# Patient Record
Sex: Female | Born: 1965 | Race: White | Hispanic: No | Marital: Married | Smoking: Former smoker
Health system: Southern US, Community
[De-identification: ages and names within clinical notes are randomized; demographics above are authoritative.]

## PROBLEM LIST (undated history)

## (undated) ENCOUNTER — Emergency Department
Admission: EM | Disposition: A | Discharge status: Home / Self Care | Payer: No Typology Code available for payment source

## (undated) DIAGNOSIS — M519 Unspecified thoracic, thoracolumbar and lumbosacral intervertebral disc disorder: Secondary | ICD-10-CM

## (undated) DIAGNOSIS — J449 Chronic obstructive pulmonary disease, unspecified: Secondary | ICD-10-CM

## (undated) DIAGNOSIS — L280 Lichen simplex chronicus: Secondary | ICD-10-CM

## (undated) DIAGNOSIS — IMO0002 Reserved for concepts with insufficient information to code with codable children: Secondary | ICD-10-CM

## (undated) DIAGNOSIS — Z973 Presence of spectacles and contact lenses: Secondary | ICD-10-CM

## (undated) DIAGNOSIS — F329 Major depressive disorder, single episode, unspecified: Secondary | ICD-10-CM

## (undated) DIAGNOSIS — F32A Depression, unspecified: Secondary | ICD-10-CM

## (undated) DIAGNOSIS — F419 Anxiety disorder, unspecified: Secondary | ICD-10-CM

## (undated) DIAGNOSIS — G4700 Insomnia, unspecified: Secondary | ICD-10-CM

## (undated) DIAGNOSIS — K219 Gastro-esophageal reflux disease without esophagitis: Secondary | ICD-10-CM

## (undated) DIAGNOSIS — F319 Bipolar disorder, unspecified: Secondary | ICD-10-CM

## (undated) DIAGNOSIS — N809 Endometriosis, unspecified: Secondary | ICD-10-CM

## (undated) DIAGNOSIS — E119 Type 2 diabetes mellitus without complications: Secondary | ICD-10-CM

## (undated) DIAGNOSIS — E8940 Asymptomatic postprocedural ovarian failure: Secondary | ICD-10-CM

## (undated) DIAGNOSIS — N952 Postmenopausal atrophic vaginitis: Secondary | ICD-10-CM

## (undated) DIAGNOSIS — L900 Lichen sclerosus et atrophicus: Secondary | ICD-10-CM

## (undated) DIAGNOSIS — I509 Heart failure, unspecified: Secondary | ICD-10-CM

## (undated) HISTORY — DX: Asymptomatic postprocedural ovarian failure: E89.40

## (undated) HISTORY — DX: Insomnia, unspecified: G47.00

## (undated) HISTORY — DX: Heart failure, unspecified: I50.9

## (undated) HISTORY — DX: Postmenopausal atrophic vaginitis: N95.2

## (undated) HISTORY — PX: OTHER SURGICAL HISTORY: SHX169

## (undated) HISTORY — DX: Lichen simplex chronicus: L28.0

## (undated) HISTORY — DX: Endometriosis, unspecified: N80.9

## (undated) HISTORY — DX: Lichen sclerosus et atrophicus: L90.0

## (undated) HISTORY — DX: Unspecified thoracic, thoracolumbar and lumbosacral intervertebral disc disorder: M51.9

## (undated) HISTORY — DX: Reserved for concepts with insufficient information to code with codable children: IMO0002

---

## 1993-01-17 HISTORY — PX: ABDOMINAL HYSTERECTOMY: SHX81

## 2006-06-13 ENCOUNTER — Other Ambulatory Visit: Payer: Self-pay

## 2006-06-13 ENCOUNTER — Emergency Department: Payer: Self-pay

## 2006-06-16 ENCOUNTER — Emergency Department: Payer: Self-pay | Admitting: Emergency Medicine

## 2007-08-29 ENCOUNTER — Emergency Department: Payer: Self-pay | Admitting: Emergency Medicine

## 2008-03-22 ENCOUNTER — Emergency Department: Payer: Self-pay | Admitting: Emergency Medicine

## 2008-12-19 ENCOUNTER — Emergency Department: Payer: Self-pay | Admitting: Emergency Medicine

## 2009-03-23 ENCOUNTER — Emergency Department: Payer: Self-pay | Admitting: Emergency Medicine

## 2010-10-27 ENCOUNTER — Emergency Department: Payer: Self-pay | Admitting: Emergency Medicine

## 2010-11-09 ENCOUNTER — Ambulatory Visit: Payer: Self-pay | Admitting: Family Medicine

## 2011-01-18 HISTORY — PX: ARM WOUND REPAIR / CLOSURE: SUR1141

## 2011-01-27 ENCOUNTER — Ambulatory Visit: Payer: Self-pay | Admitting: Gastroenterology

## 2011-04-08 ENCOUNTER — Emergency Department: Payer: Self-pay | Admitting: Emergency Medicine

## 2011-04-08 LAB — URINALYSIS, COMPLETE
Bilirubin,UR: NEGATIVE
Ketone: NEGATIVE
Leukocyte Esterase: NEGATIVE
Nitrite: NEGATIVE
Protein: NEGATIVE

## 2011-04-23 ENCOUNTER — Emergency Department: Payer: Self-pay | Admitting: *Deleted

## 2011-04-23 LAB — COMPREHENSIVE METABOLIC PANEL
Albumin: 3.2 g/dL — ABNORMAL LOW (ref 3.4–5.0)
Anion Gap: 9 (ref 7–16)
Bilirubin,Total: 0.3 mg/dL (ref 0.2–1.0)
Calcium, Total: 8.9 mg/dL (ref 8.5–10.1)
Co2: 22 mmol/L (ref 21–32)
Creatinine: 0.84 mg/dL (ref 0.60–1.30)
EGFR (Non-African Amer.): >60
Osmolality: 274 (ref 275–301)
Potassium: 3.7 mmol/L (ref 3.5–5.1)
SGOT(AST): 22 U/L (ref 15–37)
Sodium: 136 mmol/L (ref 136–145)

## 2011-04-23 LAB — CBC
HCT: 37.1 % (ref 35.0–47.0)
HGB: 12.6 g/dL (ref 12.0–16.0)
MCH: 32 pg (ref 26.0–34.0)
MCV: 94 fL (ref 80–100)
Platelet: 283 10*3/uL (ref 150–440)
RBC: 3.95 10*6/uL (ref 3.80–5.20)
RDW: 13.7 % (ref 11.5–14.5)

## 2011-04-25 ENCOUNTER — Inpatient Hospital Stay: Payer: Self-pay | Admitting: Surgery

## 2011-04-25 LAB — CBC WITH DIFFERENTIAL/PLATELET
Basophil %: 0.1 %
Eosinophil #: 0.1 10*3/uL (ref 0.0–0.7)
Eosinophil %: 0.7 %
Lymphocyte #: 1.2 10*3/uL (ref 1.0–3.6)
MCV: 94 fL (ref 80–100)
Monocyte %: 7.9 %
Platelet: 287 10*3/uL (ref 150–440)
RDW: 13.9 % (ref 11.5–14.5)
WBC: 19.4 10*3/uL — ABNORMAL HIGH (ref 3.6–11.0)

## 2011-04-25 LAB — COMPREHENSIVE METABOLIC PANEL
Albumin: 2.9 g/dL — ABNORMAL LOW (ref 3.4–5.0)
Bilirubin,Total: 0.6 mg/dL (ref 0.2–1.0)
Chloride: 99 mmol/L (ref 98–107)
Co2: 25 mmol/L (ref 21–32)
Creatinine: 0.83 mg/dL (ref 0.60–1.30)
EGFR (African American): >60
EGFR (Non-African Amer.): >60
Glucose: 113 mg/dL — ABNORMAL HIGH (ref 65–99)
Osmolality: 275 (ref 275–301)
SGOT(AST): 32 U/L (ref 15–37)
SGPT (ALT): 20 U/L
Total Protein: 7.7 g/dL (ref 6.4–8.2)

## 2011-04-29 LAB — CREATININE, SERUM: EGFR (African American): >60

## 2011-04-29 LAB — VANCOMYCIN, TROUGH: Vancomycin, Trough: 30 ug/mL (ref 10–20)

## 2011-04-30 LAB — CULTURE, BLOOD (SINGLE)

## 2011-05-25 ENCOUNTER — Ambulatory Visit: Payer: Self-pay | Admitting: Surgery

## 2011-05-28 LAB — WOUND AEROBIC CULTURE

## 2011-08-19 ENCOUNTER — Emergency Department: Payer: Self-pay | Admitting: *Deleted

## 2011-08-19 LAB — CBC WITH DIFFERENTIAL/PLATELET
Basophil #: 0 10*3/uL (ref 0.0–0.1)
Eosinophil #: 0.3 10*3/uL (ref 0.0–0.7)
Eosinophil %: 2.9 %
HCT: 39 % (ref 35.0–47.0)
Lymphocyte %: 34.3 %
MCHC: 34.9 g/dL (ref 32.0–36.0)
MCV: 92 fL (ref 80–100)
Monocyte #: 0.6 x10 3/mm (ref 0.2–0.9)
Monocyte %: 6.8 %
Neutrophil %: 55.6 %
Platelet: 290 10*3/uL (ref 150–440)
RBC: 4.26 10*6/uL (ref 3.80–5.20)
RDW: 13.6 % (ref 11.5–14.5)

## 2011-08-19 LAB — BASIC METABOLIC PANEL
Anion Gap: 9 (ref 7–16)
BUN: 13 mg/dL (ref 7–18)
Co2: 27 mmol/L (ref 21–32)
EGFR (Non-African Amer.): >60
Glucose: 103 mg/dL — ABNORMAL HIGH (ref 65–99)
Osmolality: 285 (ref 275–301)

## 2012-02-03 ENCOUNTER — Emergency Department: Payer: Self-pay | Admitting: Emergency Medicine

## 2012-02-05 ENCOUNTER — Emergency Department: Payer: Self-pay | Admitting: Emergency Medicine

## 2012-02-06 ENCOUNTER — Emergency Department: Payer: Self-pay | Admitting: Emergency Medicine

## 2012-02-07 LAB — WOUND CULTURE

## 2012-03-02 LAB — COMPREHENSIVE METABOLIC PANEL
Anion Gap: 7 (ref 7–16)
BUN: 7 mg/dL (ref 7–18)
Bilirubin,Total: 0.3 mg/dL (ref 0.2–1.0)
Calcium, Total: 8.7 mg/dL (ref 8.5–10.1)
Chloride: 108 mmol/L — ABNORMAL HIGH (ref 98–107)
Co2: 24 mmol/L (ref 21–32)
Creatinine: 0.96 mg/dL (ref 0.60–1.30)
EGFR (African American): >60
EGFR (Non-African Amer.): >60
Glucose: 98 mg/dL (ref 65–99)
Osmolality: 275 (ref 275–301)
SGOT(AST): 22 U/L (ref 15–37)
Sodium: 139 mmol/L (ref 136–145)
Total Protein: 7.8 g/dL (ref 6.4–8.2)

## 2012-03-02 LAB — CBC
HCT: 44 % (ref 35.0–47.0)
MCH: 30.8 pg (ref 26.0–34.0)
MCHC: 33.9 g/dL (ref 32.0–36.0)
MCV: 91 fL (ref 80–100)
Platelet: 261 10*3/uL (ref 150–440)
RBC: 4.86 10*6/uL (ref 3.80–5.20)
WBC: 11.7 10*3/uL — ABNORMAL HIGH (ref 3.6–11.0)

## 2012-03-02 LAB — ETHANOL
Ethanol %: <0.003 % (ref 0.000–0.080)
Ethanol: <3 mg/dL

## 2012-03-02 LAB — DRUG SCREEN, URINE
Barbiturates, Ur Screen: NEGATIVE (ref ?–200)
Cocaine Metabolite,Ur ~~LOC~~: NEGATIVE (ref ?–300)
Methadone, Ur Screen: NEGATIVE (ref ?–300)
Opiate, Ur Screen: NEGATIVE (ref ?–300)
Tricyclic, Ur Screen: NEGATIVE (ref ?–1000)

## 2012-03-02 LAB — TSH: Thyroid Stimulating Horm: 1.38 u[IU]/mL

## 2012-03-02 LAB — URINALYSIS, COMPLETE
Bilirubin,UR: NEGATIVE
Blood: NEGATIVE
Ketone: NEGATIVE
Leukocyte Esterase: NEGATIVE
Ph: 6 (ref 4.5–8.0)
Protein: NEGATIVE
Specific Gravity: 1.015 (ref 1.003–1.030)

## 2012-03-02 LAB — ACETAMINOPHEN LEVEL: Acetaminophen: 5 ug/mL — ABNORMAL LOW

## 2012-03-03 ENCOUNTER — Inpatient Hospital Stay: Payer: Self-pay | Admitting: Psychiatry

## 2012-03-28 ENCOUNTER — Emergency Department: Payer: Self-pay | Admitting: Emergency Medicine

## 2012-05-14 ENCOUNTER — Ambulatory Visit: Payer: Self-pay

## 2012-08-22 ENCOUNTER — Ambulatory Visit: Payer: Self-pay

## 2012-08-28 ENCOUNTER — Ambulatory Visit: Payer: Self-pay

## 2013-03-18 ENCOUNTER — Ambulatory Visit: Payer: Self-pay

## 2013-03-27 ENCOUNTER — Ambulatory Visit: Payer: Self-pay

## 2013-08-25 ENCOUNTER — Emergency Department (HOSPITAL_COMMUNITY)
Admission: EM | Admit: 2013-08-25 | Discharge: 2013-08-26 | Disposition: A | Discharge status: Home / Self Care | Payer: Self-pay | Attending: Emergency Medicine | Admitting: Emergency Medicine

## 2013-08-25 ENCOUNTER — Emergency Department (HOSPITAL_COMMUNITY): Payer: No Typology Code available for payment source

## 2013-08-25 DIAGNOSIS — S8990XA Unspecified injury of unspecified lower leg, initial encounter: Secondary | ICD-10-CM | POA: Insufficient documentation

## 2013-08-25 DIAGNOSIS — Y92410 Unspecified street and highway as the place of occurrence of the external cause: Secondary | ICD-10-CM | POA: Insufficient documentation

## 2013-08-25 DIAGNOSIS — Z79899 Other long term (current) drug therapy: Secondary | ICD-10-CM | POA: Insufficient documentation

## 2013-08-25 DIAGNOSIS — S99919A Unspecified injury of unspecified ankle, initial encounter: Secondary | ICD-10-CM

## 2013-08-25 DIAGNOSIS — T07XXXA Unspecified multiple injuries, initial encounter: Secondary | ICD-10-CM

## 2013-08-25 DIAGNOSIS — S99929A Unspecified injury of unspecified foot, initial encounter: Secondary | ICD-10-CM

## 2013-08-25 DIAGNOSIS — S81809A Unspecified open wound, unspecified lower leg, initial encounter: Principal | ICD-10-CM

## 2013-08-25 DIAGNOSIS — IMO0002 Reserved for concepts with insufficient information to code with codable children: Secondary | ICD-10-CM | POA: Insufficient documentation

## 2013-08-25 DIAGNOSIS — S81012A Laceration without foreign body, left knee, initial encounter: Secondary | ICD-10-CM

## 2013-08-25 DIAGNOSIS — S91009A Unspecified open wound, unspecified ankle, initial encounter: Principal | ICD-10-CM

## 2013-08-25 DIAGNOSIS — S81009A Unspecified open wound, unspecified knee, initial encounter: Secondary | ICD-10-CM | POA: Insufficient documentation

## 2013-08-25 DIAGNOSIS — Y9389 Activity, other specified: Secondary | ICD-10-CM | POA: Insufficient documentation

## 2013-08-25 LAB — CBC WITH DIFFERENTIAL/PLATELET
Basophils Absolute: 0 10*3/uL (ref 0.0–0.1)
Basophils Relative: 0 % (ref 0–1)
EOS ABS: 0.1 10*3/uL (ref 0.0–0.7)
Eosinophils Relative: 1 % (ref 0–5)
HCT: 39.3 % (ref 36.0–46.0)
HEMOGLOBIN: 13 g/dL (ref 12.0–15.0)
LYMPHS ABS: 3.6 10*3/uL (ref 0.7–4.0)
Lymphocytes Relative: 37 % (ref 12–46)
MCH: 31.9 pg (ref 26.0–34.0)
MCHC: 33.1 g/dL (ref 30.0–36.0)
MCV: 96.3 fL (ref 78.0–100.0)
Monocytes Absolute: 0.7 10*3/uL (ref 0.1–1.0)
Monocytes Relative: 8 % (ref 3–12)
NEUTROS ABS: 5.3 10*3/uL (ref 1.7–7.7)
NEUTROS PCT: 54 % (ref 43–77)
Platelets: 262 10*3/uL (ref 150–400)
RBC: 4.08 MIL/uL (ref 3.87–5.11)
RDW: 14.7 % (ref 11.5–15.5)
WBC: 9.7 10*3/uL (ref 4.0–10.5)

## 2013-08-25 LAB — BASIC METABOLIC PANEL
Anion gap: 15 (ref 5–15)
BUN: 12 mg/dL (ref 6–23)
CHLORIDE: 101 meq/L (ref 96–112)
CO2: 22 mEq/L (ref 19–32)
CREATININE: 0.86 mg/dL (ref 0.50–1.10)
Calcium: 9.2 mg/dL (ref 8.4–10.5)
GFR calc Af Amer: >90 mL/min (ref >90)
GFR calc non Af Amer: 80 mL/min — ABNORMAL LOW (ref >90)
GLUCOSE: 102 mg/dL — AB (ref 70–99)
POTASSIUM: 4.3 meq/L (ref 3.7–5.3)
Sodium: 138 mEq/L (ref 137–147)

## 2013-08-25 LAB — POC URINE PREG, ED: Preg Test, Ur: NEGATIVE

## 2013-08-25 MED ORDER — CEFAZOLIN SODIUM 1-5 GM-% IV SOLN
1.0000 g | Freq: Once | INTRAVENOUS | Status: AC
  Administered 2013-08-26: 1 g via INTRAVENOUS
  Filled 2013-08-25: qty 50

## 2013-08-25 MED ORDER — FENTANYL CITRATE 0.05 MG/ML IJ SOLN
25.0000 ug | Freq: Once | INTRAMUSCULAR | Status: AC
  Administered 2013-08-25: 25 ug via INTRAVENOUS
  Filled 2013-08-25: qty 2

## 2013-08-25 MED ORDER — LIDOCAINE HCL (PF) 1 % IJ SOLN
30.0000 mL | Freq: Once | INTRAMUSCULAR | Status: AC
  Administered 2013-08-26: 30 mL
  Filled 2013-08-25: qty 30

## 2013-08-25 NOTE — ED Notes (Signed)
Dr. Romeo AppleHarrison at the bedside.  He gives verbal order for POC pregnancy urine test.

## 2013-08-25 NOTE — ED Notes (Signed)
Notified patient that her husband is on the way
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
## 2013-08-25 NOTE — ED Notes (Signed)
Dr. Smith at the bedside.  

## 2013-08-25 NOTE — ED Notes (Signed)
Backboard removed by Dr. Katrinka BlazingSmith with RN assist

## 2013-08-25 NOTE — ED Notes (Signed)
Phlebotomy at the bedside  

## 2013-08-25 NOTE — ED Notes (Signed)
Dr. Harrison at the bedside. 
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
## 2013-08-26 MED ORDER — CEPHALEXIN 500 MG PO CAPS: 500.0000 mg | ORAL_CAPSULE | Freq: Three times a day (TID) | ORAL | Status: DC

## 2013-08-26 MED ORDER — FENTANYL CITRATE 0.05 MG/ML IJ SOLN
25.0000 ug | Freq: Once | INTRAMUSCULAR | Status: AC
  Administered 2013-08-26: 25 ug via INTRAVENOUS

## 2013-08-26 MED ORDER — OXYCODONE-ACETAMINOPHEN 5-325 MG PO TABS: 1.0000 | ORAL_TABLET | Freq: Four times a day (QID) | ORAL | Status: DC | PRN

## 2013-08-26 NOTE — ED Notes (Signed)
Patient still off the unit.

## 2013-08-26 NOTE — ED Notes (Signed)
Immobilizer placed on left leg. Return demonstration on how to use crutches.

## 2013-08-26 NOTE — ED Notes (Signed)
Dr. Romeo AppleHarrison and Dr. Katrinka BlazingSmith still at the bedside with suturing.

## 2013-08-26 NOTE — Discharge Instructions (Signed)

## 2013-08-27 NOTE — ED Provider Notes (Signed)
Medical screening examination/treatment/procedure(s) were conducted as a shared visit with resident physician and myself.  I personally evaluated the patient during the encounter. I directly supervised and aided in the laceration repair.   I interviewed and examined the patient. Lungs are CTAB. Cardiac exam wnl. Abdomen soft.  Large laceration of left knee. No evidence of violation of the knee joint on imaging. Also no evidence of violation of the knee join on extensive exam performed by me and the resident after local anesthesia. No other serious injury suspected. Strong return precautions given for any evidence of infection.    Purvis SheffieldForrest Jamea Robicheaux, MD 08/27/13 1059

## 2013-09-03 ENCOUNTER — Emergency Department: Payer: Self-pay | Admitting: Emergency Medicine

## 2013-09-05 ENCOUNTER — Emergency Department: Payer: Self-pay | Admitting: Emergency Medicine

## 2013-09-09 ENCOUNTER — Emergency Department: Payer: Self-pay | Admitting: Emergency Medicine

## 2013-09-15 ENCOUNTER — Emergency Department: Payer: Self-pay | Admitting: Emergency Medicine

## 2013-09-15 LAB — COMPREHENSIVE METABOLIC PANEL
ALBUMIN: 3.2 g/dL — AB (ref 3.4–5.0)
ALK PHOS: 161 U/L — AB
Anion Gap: 10 (ref 7–16)
BUN: 9 mg/dL (ref 7–18)
Bilirubin,Total: 0.2 mg/dL (ref 0.2–1.0)
Calcium, Total: 9.3 mg/dL (ref 8.5–10.1)
Chloride: 102 mmol/L (ref 98–107)
Co2: 25 mmol/L (ref 21–32)
Creatinine: 1.22 mg/dL (ref 0.60–1.30)
GFR CALC NON AF AMER: 53 — AB
GLUCOSE: 111 mg/dL — AB (ref 65–99)
Osmolality: 273 (ref 275–301)
POTASSIUM: 4.2 mmol/L (ref 3.5–5.1)
SGOT(AST): 29 U/L (ref 15–37)
SGPT (ALT): 21 U/L
Sodium: 137 mmol/L (ref 136–145)
TOTAL PROTEIN: 7.7 g/dL (ref 6.4–8.2)

## 2013-09-15 LAB — CBC WITH DIFFERENTIAL/PLATELET
Basophil #: 0.1 10*3/uL (ref 0.0–0.1)
Basophil %: 1.3 %
EOS ABS: 0.1 10*3/uL (ref 0.0–0.7)
Eosinophil %: 0.9 %
HCT: 36.4 % (ref 35.0–47.0)
HGB: 11.9 g/dL — ABNORMAL LOW (ref 12.0–16.0)
Lymphocyte #: 2.5 10*3/uL (ref 1.0–3.6)
Lymphocyte %: 29 %
MCH: 32.1 pg (ref 26.0–34.0)
MCHC: 32.8 g/dL (ref 32.0–36.0)
MCV: 98 fL (ref 80–100)
MONO ABS: 0.6 x10 3/mm (ref 0.2–0.9)
MONOS PCT: 6.5 %
NEUTROS PCT: 62.3 %
Neutrophil #: 5.3 10*3/uL (ref 1.4–6.5)
Platelet: 378 10*3/uL (ref 150–440)
RBC: 3.71 10*6/uL — AB (ref 3.80–5.20)
RDW: 14.2 % (ref 11.5–14.5)
WBC: 8.5 10*3/uL (ref 3.6–11.0)

## 2013-09-20 ENCOUNTER — Encounter (HOSPITAL_BASED_OUTPATIENT_CLINIC_OR_DEPARTMENT_OTHER): Payer: Self-pay | Admitting: *Deleted

## 2013-09-24 ENCOUNTER — Encounter (HOSPITAL_BASED_OUTPATIENT_CLINIC_OR_DEPARTMENT_OTHER): Payer: No Typology Code available for payment source | Admitting: Anesthesiology

## 2013-09-24 ENCOUNTER — Ambulatory Visit (HOSPITAL_BASED_OUTPATIENT_CLINIC_OR_DEPARTMENT_OTHER): Payer: No Typology Code available for payment source | Admitting: Anesthesiology

## 2013-09-24 ENCOUNTER — Encounter (HOSPITAL_BASED_OUTPATIENT_CLINIC_OR_DEPARTMENT_OTHER): Payer: Self-pay | Admitting: Anesthesiology

## 2013-09-24 ENCOUNTER — Ambulatory Visit (HOSPITAL_BASED_OUTPATIENT_CLINIC_OR_DEPARTMENT_OTHER)
Admission: RE | Admit: 2013-09-24 | Discharge: 2013-09-24 | Disposition: A | Discharge status: Home / Self Care | Payer: Self-pay | Source: Ambulatory Visit | Attending: Orthopedic Surgery | Admitting: Orthopedic Surgery

## 2013-09-24 ENCOUNTER — Encounter (HOSPITAL_BASED_OUTPATIENT_CLINIC_OR_DEPARTMENT_OTHER)
Admission: RE | Disposition: A | Discharge status: Home / Self Care | Payer: Self-pay | Source: Ambulatory Visit | Attending: Orthopedic Surgery

## 2013-09-24 DIAGNOSIS — T8133XA Disruption of traumatic injury wound repair, initial encounter: Secondary | ICD-10-CM | POA: Insufficient documentation

## 2013-09-24 DIAGNOSIS — Y838 Other surgical procedures as the cause of abnormal reaction of the patient, or of later complication, without mention of misadventure at the time of the procedure: Secondary | ICD-10-CM | POA: Insufficient documentation

## 2013-09-24 DIAGNOSIS — Z886 Allergy status to analgesic agent status: Secondary | ICD-10-CM | POA: Insufficient documentation

## 2013-09-24 DIAGNOSIS — K219 Gastro-esophageal reflux disease without esophagitis: Secondary | ICD-10-CM | POA: Insufficient documentation

## 2013-09-24 DIAGNOSIS — Z885 Allergy status to narcotic agent status: Secondary | ICD-10-CM | POA: Insufficient documentation

## 2013-09-24 DIAGNOSIS — Z90710 Acquired absence of both cervix and uterus: Secondary | ICD-10-CM | POA: Insufficient documentation

## 2013-09-24 DIAGNOSIS — F3289 Other specified depressive episodes: Secondary | ICD-10-CM | POA: Insufficient documentation

## 2013-09-24 DIAGNOSIS — F411 Generalized anxiety disorder: Secondary | ICD-10-CM | POA: Insufficient documentation

## 2013-09-24 DIAGNOSIS — L980 Pyogenic granuloma: Secondary | ICD-10-CM | POA: Insufficient documentation

## 2013-09-24 DIAGNOSIS — F329 Major depressive disorder, single episode, unspecified: Secondary | ICD-10-CM | POA: Insufficient documentation

## 2013-09-24 HISTORY — DX: Depression, unspecified: F32.A

## 2013-09-24 HISTORY — DX: Anxiety disorder, unspecified: F41.9

## 2013-09-24 HISTORY — DX: Major depressive disorder, single episode, unspecified: F32.9

## 2013-09-24 HISTORY — DX: Gastro-esophageal reflux disease without esophagitis: K21.9

## 2013-09-24 HISTORY — PX: KNEE BURSECTOMY: SHX5882

## 2013-09-24 HISTORY — DX: Presence of spectacles and contact lenses: Z97.3

## 2013-09-24 LAB — POCT HEMOGLOBIN-HEMACUE: Hemoglobin: 12.2 g/dL (ref 12.0–15.0)

## 2013-09-24 SURGERY — BURSECTOMY, KNEE
Anesthesia: General | Site: Knee | Laterality: Left

## 2013-09-24 MED ORDER — ASPIRIN 81 MG PO TABS: 81.0000 mg | ORAL_TABLET | Freq: Every day | ORAL | Status: DC

## 2013-09-24 MED ORDER — FENTANYL CITRATE 0.05 MG/ML IJ SOLN: 50.0000 ug | INTRAMUSCULAR | Status: DC | PRN

## 2013-09-24 MED ORDER — MIDAZOLAM HCL 2 MG/2ML IJ SOLN
INTRAMUSCULAR | Status: AC
  Filled 2013-09-24: qty 2

## 2013-09-24 MED ORDER — DOCUSATE SODIUM 100 MG PO CAPS: 100.0000 mg | ORAL_CAPSULE | Freq: Two times a day (BID) | ORAL | Status: DC

## 2013-09-24 MED ORDER — HYDROMORPHONE HCL PF 1 MG/ML IJ SOLN
0.2500 mg | INTRAMUSCULAR | Status: DC | PRN
  Administered 2013-09-24 (×3): 0.5 mg via INTRAVENOUS

## 2013-09-24 MED ORDER — HYDROMORPHONE HCL PF 1 MG/ML IJ SOLN
INTRAMUSCULAR | Status: AC
  Filled 2013-09-24: qty 1

## 2013-09-24 MED ORDER — CEFAZOLIN SODIUM-DEXTROSE 2-3 GM-% IV SOLR
2.0000 g | INTRAVENOUS | Status: AC
  Administered 2013-09-24: 2 g via INTRAVENOUS

## 2013-09-24 MED ORDER — MIDAZOLAM HCL 2 MG/2ML IJ SOLN: 1.0000 mg | INTRAMUSCULAR | Status: DC | PRN

## 2013-09-24 MED ORDER — FENTANYL CITRATE 0.05 MG/ML IJ SOLN
INTRAMUSCULAR | Status: DC | PRN
Start: 2013-09-24 — End: 2013-09-24
  Administered 2013-09-24 (×3): 50 ug via INTRAVENOUS

## 2013-09-24 MED ORDER — ACETAMINOPHEN 500 MG PO TABS: 1000.0000 mg | ORAL_TABLET | Freq: Once | ORAL | Status: DC

## 2013-09-24 MED ORDER — FENTANYL CITRATE 0.05 MG/ML IJ SOLN
INTRAMUSCULAR | Status: AC
  Filled 2013-09-24: qty 6

## 2013-09-24 MED ORDER — ONDANSETRON HCL 4 MG/2ML IJ SOLN: 4.0000 mg | Freq: Four times a day (QID) | INTRAMUSCULAR | Status: DC | PRN

## 2013-09-24 MED ORDER — LIDOCAINE HCL (CARDIAC) 20 MG/ML IV SOLN
INTRAVENOUS | Status: DC | PRN
  Administered 2013-09-24: 50 mg via INTRAVENOUS

## 2013-09-24 MED ORDER — LACTATED RINGERS IV SOLN
INTRAVENOUS | Status: DC
  Administered 2013-09-24 (×2): via INTRAVENOUS

## 2013-09-24 MED ORDER — PROPOFOL 10 MG/ML IV BOLUS
INTRAVENOUS | Status: DC | PRN
  Administered 2013-09-24: 180 mg via INTRAVENOUS
  Administered 2013-09-24: 20 mg via INTRAVENOUS

## 2013-09-24 MED ORDER — BUPIVACAINE HCL (PF) 0.25 % IJ SOLN
INTRAMUSCULAR | Status: AC
  Filled 2013-09-24: qty 30

## 2013-09-24 MED ORDER — OXYCODONE-ACETAMINOPHEN 5-325 MG PO TABS: 2.0000 | ORAL_TABLET | ORAL | Status: DC | PRN

## 2013-09-24 MED ORDER — OXYCODONE HCL 5 MG/5ML PO SOLN: 5.0000 mg | Freq: Once | ORAL | Status: AC | PRN

## 2013-09-24 MED ORDER — ONDANSETRON HCL 4 MG PO TABS: 4.0000 mg | ORAL_TABLET | Freq: Three times a day (TID) | ORAL | Status: DC | PRN

## 2013-09-24 MED ORDER — OXYCODONE HCL 5 MG PO TABS
ORAL_TABLET | ORAL | Status: AC
  Filled 2013-09-24: qty 1

## 2013-09-24 MED ORDER — DEXTROSE-NACL 5-0.45 % IV SOLN: 100.0000 mL/h | INTRAVENOUS | Status: DC

## 2013-09-24 MED ORDER — DEXAMETHASONE SODIUM PHOSPHATE 10 MG/ML IJ SOLN
INTRAMUSCULAR | Status: DC | PRN
  Administered 2013-09-24: 10 mg via INTRAVENOUS

## 2013-09-24 MED ORDER — OXYCODONE HCL 5 MG PO TABS
5.0000 mg | ORAL_TABLET | Freq: Once | ORAL | Status: AC | PRN
  Administered 2013-09-24: 5 mg via ORAL

## 2013-09-24 SURGICAL SUPPLY — 65 items
BAG DECANTER FOR FLEXI CONT (MISCELLANEOUS) IMPLANT
BANDAGE ELASTIC 6 VELCRO ST LF (GAUZE/BANDAGES/DRESSINGS) ×3 IMPLANT
BANDAGE ESMARK 6X9 LF (GAUZE/BANDAGES/DRESSINGS) ×1 IMPLANT
BLADE SURG 10 STRL SS (BLADE) ×3 IMPLANT
BLADE SURG 15 STRL LF DISP TIS (BLADE) ×1 IMPLANT
BLADE SURG 15 STRL SS (BLADE) ×2
BNDG ESMARK 6X9 LF (GAUZE/BANDAGES/DRESSINGS) ×3
CHLORAPREP W/TINT 26ML (MISCELLANEOUS) ×3 IMPLANT
CLEANER CAUTERY TIP 5X5 PAD (MISCELLANEOUS) IMPLANT
CLOSURE WOUND 1/2 X4 (GAUZE/BANDAGES/DRESSINGS) ×1
DECANTER SPIKE VIAL GLASS SM (MISCELLANEOUS) IMPLANT
DRAPE EXTREMITY T 121X128X90 (DRAPE) ×3 IMPLANT
DRAPE U-SHAPE 47X51 STRL (DRAPES) ×3 IMPLANT
DRSG ADAPTIC 3X8 NADH LF (GAUZE/BANDAGES/DRESSINGS) ×3 IMPLANT
DRSG EMULSION OIL 3X3 NADH (GAUZE/BANDAGES/DRESSINGS) IMPLANT
DRSG PAD ABDOMINAL 8X10 ST (GAUZE/BANDAGES/DRESSINGS) ×3 IMPLANT
ELECT REM PT RETURN 9FT ADLT (ELECTROSURGICAL) ×3
ELECTRODE REM PT RTRN 9FT ADLT (ELECTROSURGICAL) ×1 IMPLANT
GAUZE PACKING IODOFORM 2 (PACKING) ×3 IMPLANT
GAUZE SPONGE 4X4 12PLY STRL (GAUZE/BANDAGES/DRESSINGS) ×3 IMPLANT
GLOVE BIO SURGEON STRL SZ 6.5 (GLOVE) ×2 IMPLANT
GLOVE BIO SURGEON STRL SZ7.5 (GLOVE) ×3 IMPLANT
GLOVE BIO SURGEONS STRL SZ 6.5 (GLOVE) ×1
GLOVE BIOGEL PI IND STRL 7.0 (GLOVE) ×2 IMPLANT
GLOVE BIOGEL PI IND STRL 8 (GLOVE) ×1 IMPLANT
GLOVE BIOGEL PI INDICATOR 7.0 (GLOVE) ×4
GLOVE BIOGEL PI INDICATOR 8 (GLOVE) ×2
GOWN STRL REUS W/ TWL LRG LVL3 (GOWN DISPOSABLE) ×1 IMPLANT
GOWN STRL REUS W/ TWL XL LVL3 (GOWN DISPOSABLE) ×1 IMPLANT
GOWN STRL REUS W/TWL LRG LVL3 (GOWN DISPOSABLE) ×2
GOWN STRL REUS W/TWL XL LVL3 (GOWN DISPOSABLE) ×2
IMMOBILIZER KNEE 22 UNIV (SOFTGOODS) IMPLANT
IMMOBILIZER KNEE 24 THIGH 36 (MISCELLANEOUS) IMPLANT
IMMOBILIZER KNEE 24 UNIV (MISCELLANEOUS)
NDL SUT 6 .5 CRC .975X.05 MAYO (NEEDLE) IMPLANT
NEEDLE MAYO TAPER (NEEDLE)
NEEDLE MAYO TROCAR (NEEDLE) IMPLANT
NS IRRIG 1000ML POUR BTL (IV SOLUTION) ×3 IMPLANT
PACK ARTHROSCOPY DSU (CUSTOM PROCEDURE TRAY) ×3 IMPLANT
PACK BASIN DAY SURGERY FS (CUSTOM PROCEDURE TRAY) ×3 IMPLANT
PAD CLEANER CAUTERY TIP 5X5 (MISCELLANEOUS)
PADDING CAST COTTON 6X4 STRL (CAST SUPPLIES) ×3 IMPLANT
PENCIL BUTTON HOLSTER BLD 10FT (ELECTRODE) ×3 IMPLANT
SLEEVE SCD COMPRESS KNEE MED (MISCELLANEOUS) IMPLANT
SPONGE LAP 4X18 X RAY DECT (DISPOSABLE) ×3 IMPLANT
STAPLER VISISTAT 35W (STAPLE) IMPLANT
STRIP CLOSURE SKIN 1/2X4 (GAUZE/BANDAGES/DRESSINGS) ×2 IMPLANT
SUT FIBERWIRE #2 38 T-5 BLUE (SUTURE)
SUT MNCRL AB 4-0 PS2 18 (SUTURE) ×3 IMPLANT
SUT MON AB 2-0 CT1 36 (SUTURE) ×3 IMPLANT
SUT VIC AB 0 CT1 18XCR BRD 8 (SUTURE) IMPLANT
SUT VIC AB 0 CT1 27 (SUTURE)
SUT VIC AB 0 CT1 27XBRD ANBCTR (SUTURE) IMPLANT
SUT VIC AB 0 CT1 8-18 (SUTURE)
SUT VIC AB 0 SH 27 (SUTURE) IMPLANT
SUT VIC AB 1 CT1 27 (SUTURE)
SUT VIC AB 1 CT1 27XBRD ANBCTR (SUTURE) IMPLANT
SUT VIC AB 2-0 SH 27 (SUTURE)
SUT VIC AB 2-0 SH 27XBRD (SUTURE) IMPLANT
SUTURE FIBERWR #2 38 T-5 BLUE (SUTURE) IMPLANT
SYR BULB 3OZ (MISCELLANEOUS) ×3 IMPLANT
TOWEL OR 17X24 6PK STRL BLUE (TOWEL DISPOSABLE) ×3 IMPLANT
TOWEL OR NON WOVEN STRL DISP B (DISPOSABLE) ×3 IMPLANT
UNDERPAD 30X30 INCONTINENT (UNDERPADS AND DIAPERS) ×3 IMPLANT
YANKAUER SUCT BULB TIP NO VENT (SUCTIONS) ×3 IMPLANT

## 2013-09-24 NOTE — Discharge Instructions (Signed)
Weight as tolerated.  Wearing her knee immobilizer full time.  Remove your dressing on Thursday and remove 2 inches of packing every day snipping off the remainder. Do wet-to-dry dressings on the central open portion of the wound. Post Anesthesia Home Care Instructions  Activity: Get plenty of rest for the remainder of the day. A responsible adult should stay with you for 24 hours following the procedure.  For the next 24 hours, DO NOT: -Drive a car -Advertising copywriter -Drink alcoholic beverages -Take any medication unless instructed by your physician -Make any legal decisions or sign important papers.  Meals: Start with liquid foods such as gelatin or soup. Progress to regular foods as tolerated. Avoid greasy, spicy, heavy foods. If nausea and/or vomiting occur, drink only clear liquids until the nausea and/or vomiting subsides. Call your physician if vomiting continues.  Special Instructions/Symptoms: Your throat may feel dry or sore from the anesthesia or the breathing tube placed in your throat during surgery. If this causes discomfort, gargle with warm salt water. The discomfort should disappear within 24 hours.

## 2013-09-24 NOTE — Op Note (Signed)
09/24/2013  10:28 AM  PATIENT:  Brandy Robinson    PRE-OPERATIVE DIAGNOSIS:  left knee: wound laceration knee  POST-OPERATIVE DIAGNOSIS:  Same  PROCEDURE:  IRRIGATION AND DEBRIDEMENT OF LEFT KNEE   SURGEON:  Margarita Rana, D, MD  ASSISTANT: Janace Litten, OPA, He was necessary for efficiency and safety of the case.   ANESTHESIA:   Gen  PREOPERATIVE INDICATIONS:  Brandy Robinson is a  48 y.o. female with a diagnosis of left knee: wound laceration knee who failed conservative measures and elected for surgical management.    The risks benefits and alternatives were discussed with the patient preoperatively including but not limited to the risks of infection, bleeding, nerve injury, cardiopulmonary complications, the need for revision surgery, among others, and the patient was willing to proceed.  OPERATIVE IMPLANTS: none  OPERATIVE FINDINGS: 13cm laceration, no gross contamination  BLOOD LOSS: min  COMPLICATIONS: none  TOURNIQUET TIME: none  OPERATIVE PROCEDURE:  Patient was identified in the preoperative holding area and site was marked by me She was transported to the operating theater and placed on the table in supine position taking care to pad all bony prominences. After a preincinduction time out anesthesia was induced. The left lower extremity was prepped and draped in normal sterile fashion and a pre-incision timeout was performed. She received ancef for preoperative antibiotics.   SA by performing a debridement of fibula this tissue within the wound. She did have a good amount of granulation tissue. I probed proximally to her degloved region I did not find any purulence or any significant gross contamination. I did remove some necrotic fat from this area and debrided some muscle and fascia. Injured at the time of injury as well.  I then irrigated with 3 L of normal saline.  I then closed the lateral aspects of the wound and packed it with sterile packing. I packed  proximally and distally with 2 separate packing strips. I used a simple nylon stitches to close the medial or lateral aspects the wound this did leave a roughly 3 cm central portion with gapping of 2 cm. There is good granulation tissue tissue throughout this.  I then irrigated the wound again placed a sterile dressing and a knee immobilizer. His taken the PACU in stable condition.  POST OPERATIVE PLAN: WBAT, Knee immobilizer full time, wet to dry dressings.     This note was generated using a template and dragon dictation system. In light of that, I have reviewed the note and all aspects of it are applicable to this case. Any dictation errors are due to the computerized dictation system.

## 2013-09-24 NOTE — Anesthesia Procedure Notes (Signed)
Procedure Name: LMA Insertion Date/Time: 09/24/2013 9:55 AM Performed by: Genevieve Norlander L Pre-anesthesia Checklist: Patient identified, Emergency Drugs available, Suction available, Patient being monitored and Timeout performed Patient Re-evaluated:Patient Re-evaluated prior to inductionOxygen Delivery Method: Circle System Utilized Preoxygenation: Pre-oxygenation with 100% oxygen Intubation Type: IV induction Ventilation: Mask ventilation without difficulty LMA: LMA inserted LMA Size: 4.0 Number of attempts: 1 Airway Equipment and Method: bite block Placement Confirmation: positive ETCO2 and breath sounds checked- equal and bilateral Tube secured with: Tape Dental Injury: Teeth and Oropharynx as per pre-operative assessment

## 2013-09-24 NOTE — Anesthesia Preprocedure Evaluation (Signed)
Anesthesia Evaluation  Patient identified by MRN, date of birth, ID band Patient awake    Reviewed: Allergy & Precautions, H&P , NPO status , Patient's Chart, lab work & pertinent test results  Airway Mallampati: II  Neck ROM: full    Dental   Pulmonary Current Smoker,          Cardiovascular negative cardio ROS      Neuro/Psych PSYCHIATRIC DISORDERS Anxiety Depression    GI/Hepatic GERD-  ,  Endo/Other  obese  Renal/GU      Musculoskeletal   Abdominal   Peds  Hematology   Anesthesia Other Findings   Reproductive/Obstetrics                           Anesthesia Physical Anesthesia Plan  ASA: II  Anesthesia Plan: General   Post-op Pain Management:    Induction: Intravenous  Airway Management Planned: LMA  Additional Equipment:   Intra-op Plan:   Post-operative Plan:   Informed Consent: I have reviewed the patients History and Physical, chart, labs and discussed the procedure including the risks, benefits and alternatives for the proposed anesthesia with the patient or authorized representative who has indicated his/her understanding and acceptance.     Plan Discussed with: CRNA, Anesthesiologist and Surgeon  Anesthesia Plan Comments:         Anesthesia Quick Evaluation

## 2013-09-24 NOTE — H&P (Signed)
ORTHOPAEDIC CONSULTATION  REQUESTING PHYSICIAN: Renette Butters, MD  Chief Complaint: Knee laceration left  HPI: Brandy Robinson is a 48 y.o. female who complains of  Pain and a 10 day old knee laceration  Past Medical History  Diagnosis Date  . Wears glasses   . Depression   . Anxiety   . GERD (gastroesophageal reflux disease)    Past Surgical History  Procedure Laterality Date  . Abdominal hysterectomy  1995    [facility]  . Arm wound repair / closure  2013    cellulitis rt arm-i/d   History   Social History  . Marital Status: Married    Spouse Name: N/A    Number of Children: N/A  . Years of Education: N/A   Social History Main Topics  . Smoking status: Current Every Day Smoker -- 0.50 packs/day  . Smokeless tobacco: None  . Alcohol Use: No  . Drug Use: No  . Sexual Activity: None   Other Topics Concern  . None   Social History Narrative  . None   History reviewed. No pertinent family history. Allergies  Allergen Reactions  . Hydrocodone Itching  . Aspirin Other (See Comments)    Stomach hurts  . Morphine And Related Other (See Comments)    Chest pain   Prior to Admission medications   Medication Sig Start Date End Date Taking? Authorizing Provider  citalopram (CELEXA) 20 MG tablet Take 20 mg by mouth daily.   Yes Historical Provider, MD  clonazePAM (KLONOPIN) 0.5 MG tablet Take 0.5 mg by mouth 2 (two) times daily as needed for anxiety. Take 1/2 tablet every morning and 1 tablet at night   Yes Historical Provider, MD  clonazePAM (KLONOPIN) 1 MG tablet Take 1 mg by mouth at bedtime.   Yes Historical Provider, MD  estradiol (ESTRACE) 0.5 MG tablet Take 0.5 mg by mouth at bedtime.   Yes Historical Provider, MD  omeprazole (PRILOSEC) 20 MG capsule Take 20 mg by mouth 2 (two) times daily before a meal.   Yes Historical Provider, MD  oxyCODONE-acetaminophen (PERCOCET/ROXICET) 5-325 MG per tablet Take 1-2 tablets by mouth every 6 (six) hours as needed  for moderate pain or severe pain. 08/26/13  Yes Sherian Maroon, MD  QUEtiapine (SEROQUEL XR) 200 MG 24 hr tablet Take 200 mg by mouth at bedtime.   Yes Historical Provider, MD  QUEtiapine (SEROQUEL) 50 MG tablet Take 50 mg by mouth 2 (two) times daily. Rapid release   Yes Historical Provider, MD  sulfamethoxazole-trimethoprim (BACTRIM DS,SEPTRA DS) 800-160 MG per tablet Take 1 tablet by mouth 2 (two) times daily.   Yes Historical Provider, MD  cephALEXin (KEFLEX) 500 MG capsule Take 1 capsule (500 mg total) by mouth 3 (three) times daily. 08/26/13   Sherian Maroon, MD   No results found.  Positive ROS: All other systems have been reviewed and were otherwise negative with the exception of those mentioned in the HPI and as above.  Labs cbc No results found for this basename: WBC, HGB, HCT, PLT,  in the last 72 hours  Labs inflam No results found for this basename: ESR, CRP,  in the last 72 hours  Labs coag No results found for this basename: INR, PT, PTT,  in the last 72 hours  No results found for this basename: NA, K, CL, CO2, GLUCOSE, BUN, CREATININE, CALCIUM,  in the last 72 hours  Physical Exam: There were no vitals filed for this visit. General: Alert,  no acute distress Cardiovascular: No pedal edema Respiratory: No cyanosis, no use of accessory musculature GI: No organomegaly, abdomen is soft and non-tender Skin: No lesions in the area of chief complaint other than those listed below in MSK exam.  Neurologic: Sensation intact distally Psychiatric: Patient is competent for consent with normal mood and affect Lymphatic: No axillary or cervical lymphadenopathy  MUSCULOSKELETAL:  13cm laceration to L knee with dehiscence Other extremities are atraumatic with painless ROM and NVI.  Assessment: L knee laceration   Plan: I&D and wound closure in OR Edmonia Lynch, D, MD Cell [phone]   09/24/2013 9:22 AM

## 2013-09-24 NOTE — Transfer of Care (Signed)
Immediate Anesthesia Transfer of Care Note  Patient: Brandy Robinson  Procedure(s) Performed: Procedure(s): IRRIGATION AND DEBRIDEMENT OF LEFT KNEE  (Left)  Patient Location: PACU  Anesthesia Type:General  Level of Consciousness: awake, oriented and patient cooperative  Airway & Oxygen Therapy: Patient Spontanous Breathing and Patient connected to face mask oxygen  Post-op Assessment: Report given to PACU RN and Post -op Vital signs reviewed and stable  Post vital signs: Reviewed and stable  Complications: No apparent anesthesia complications

## 2013-09-24 NOTE — Anesthesia Postprocedure Evaluation (Signed)
Anesthesia Post Note  Patient: Brandy Robinson  Procedure(s) Performed: Procedure(s) (LRB): IRRIGATION AND DEBRIDEMENT OF LEFT KNEE  (Left)  Anesthesia type: General  Patient location: PACU  Post pain: Pain level controlled and Adequate analgesia  Post assessment: Post-op Vital signs reviewed, Patient's Cardiovascular Status Stable, Respiratory Function Stable, Patent Airway and Pain level controlled  Last Vitals:  Filed Vitals:   09/24/13 1200  BP: 110/60  Pulse: 108  Temp:   Resp: 18    Post vital signs: Reviewed and stable  Level of consciousness: awake, alert  and oriented  Complications: No apparent anesthesia complications

## 2013-09-25 ENCOUNTER — Encounter (HOSPITAL_BASED_OUTPATIENT_CLINIC_OR_DEPARTMENT_OTHER): Payer: Self-pay | Admitting: Orthopedic Surgery

## 2013-09-28 LAB — TISSUE CULTURE

## 2013-10-08 ENCOUNTER — Encounter: Payer: Self-pay | Admitting: Internal Medicine

## 2013-10-08 ENCOUNTER — Ambulatory Visit (INDEPENDENT_AMBULATORY_CARE_PROVIDER_SITE_OTHER): Payer: Self-pay | Admitting: Internal Medicine

## 2013-10-08 VITALS — BP 122/80 | HR 125 | Temp 98.8°F | Wt 185.0 lb

## 2013-10-08 DIAGNOSIS — T798XXS Other early complications of trauma, sequela: Secondary | ICD-10-CM

## 2013-10-08 DIAGNOSIS — T799XXS Unspecified early complication of trauma, sequela: Secondary | ICD-10-CM

## 2013-10-08 MED ORDER — AMOXICILLIN 500 MG PO CAPS: 500.0000 mg | ORAL_CAPSULE | Freq: Three times a day (TID) | ORAL | Status: DC

## 2013-10-08 MED ORDER — SULFAMETHOXAZOLE-TRIMETHOPRIM 800-160 MG PO TABS: 1.0000 | ORAL_TABLET | Freq: Two times a day (BID) | ORAL | Status: DC

## 2013-10-08 NOTE — Progress Notes (Signed)
Subjective:    Patient ID: Brandy Robinson, female    DOB: 11-30-1965, 48 y.o.   MRN: [id number]  HPI Brandy Batten is a 48yo F who sustained knee laceration accident on 8/9, had sutures placed but unclear if given antibiotics. She was referred [facility] care. Wound dechisced and supbsequently had I x D, on 9/8. OR cultures grew MRSA and amp S enterococcus. She was given rx for amox/clav plus bactrim, but she was only able to afford bactrim, which she finished last week. The wound is getting smaller, elliptical shape 7cm long and 1.75cm height. Good granulation bed with slight exudate at inferior edge. No fever or chills.  Current Outpatient Prescriptions on File Prior to Visit  Medication Sig Dispense Refill  . aspirin 81 MG tablet Take 1 tablet (81 mg total) by mouth daily.  30 tablet  0  . citalopram (CELEXA) 20 MG tablet Take 20 mg by mouth daily.      . clonazePAM (KLONOPIN) 0.5 MG tablet Take 0.5 mg by mouth 2 (two) times daily as needed for anxiety. Take 1/2 tablet every morning and 1 tablet at night      . clonazePAM (KLONOPIN) 1 MG tablet Take 1 mg by mouth at bedtime.      . docusate sodium (COLACE) 100 MG capsule Take 1 capsule (100 mg total) by mouth 2 (two) times daily.  60 capsule  0  . estradiol (ESTRACE) 0.5 MG tablet Take 0.5 mg by mouth at bedtime.      Marland Kitchen omeprazole (PRILOSEC) 20 MG capsule Take 20 mg by mouth 2 (two) times daily before a meal.      . ondansetron (ZOFRAN) 4 MG tablet Take 1 tablet (4 mg total) by mouth every 8 (eight) hours as needed for nausea or vomiting.  60 tablet  0  . oxyCODONE-acetaminophen (PERCOCET/ROXICET) 5-325 MG per tablet Take 1-2 tablets by mouth every 6 (six) hours as needed for moderate pain or severe pain.  12 tablet  0  . oxyCODONE-acetaminophen (ROXICET) 5-325 MG per tablet Take 2 tablets by mouth every 4 (four) hours as needed.  60 tablet  0  . QUEtiapine (SEROQUEL XR) 200 MG 24 hr tablet Take 200 mg by mouth at bedtime.      Marland Kitchen  QUEtiapine (SEROQUEL) 50 MG tablet Take 50 mg by mouth 2 (two) times daily. Rapid release      . cephALEXin (KEFLEX) 500 MG capsule Take 1 capsule (500 mg total) by mouth 3 (three) times daily.  35 capsule  0  . sulfamethoxazole-trimethoprim (BACTRIM DS,SEPTRA DS) 800-160 MG per tablet Take 1 tablet by mouth 2 (two) times daily.       No current facility-administered medications on file prior to visit.   Active Ambulatory Problems    Diagnosis Date Noted  . No Active Ambulatory Problems   Resolved Ambulatory Problems    Diagnosis Date Noted  . No Resolved Ambulatory Problems   Past Medical History  Diagnosis Date  . Wears glasses   . Depression   . Anxiety   . GERD (gastroesophageal reflux disease)    History  Substance Use Topics  . Smoking status: Current Every Day Smoker -- 0.50 packs/day  . Smokeless tobacco: Not on file  . Alcohol Use: No  family history is not on file.   Review of Systems Still having some knee pain with wound. No fever, chills, nightsweats.    Objective:   Physical Exam BP 122/80  Pulse  125  Temp(Src) 98.8 F (37.1 C) (Oral)  Wt 185 lb (83.915 kg) Physical Exam  Constitutional: He is oriented to person, place, and time. He appears well-developed and well-nourished. No distress.  Lymphadenopathy:  He has no cervical adenopathy.  Skin: left knee elliptical shape 7cm long and 1.75cm height. Good granulation bed with slight exudate at inferior edge    BMET    Component Value Date/Time   NA 138 08/25/2013 2306   K 4.3 08/25/2013 2306   CL 101 08/25/2013 2306   CO2 22 08/25/2013 2306   GLUCOSE 102* 08/25/2013 2306   BUN 12 08/25/2013 2306   CREATININE 0.86 08/25/2013 2306   CALCIUM 9.2 08/25/2013 2306   GFRNONAA 80* 08/25/2013 2306   GFRAA >90 08/25/2013 2306   9/8 OR CX: MRSA and amp S enterococcus      Assessment & Plan:   wound infection, post-traumatic- called around to determine where she can have the cheapest antibiotics. [facility]  can have 2 wk free of amoxicillin  TID and bactrim DS 1 tab BID. We will have her call back at 2 wk and then we will prescribe her another 2 wk of these same antibiotics at [facility] which should be a total of $8.  Health maintenance- offer flu shot   rtc in 4 wk

## 2013-10-15 ENCOUNTER — Ambulatory Visit: Payer: Self-pay | Admitting: Internal Medicine

## 2013-10-31 ENCOUNTER — Ambulatory Visit: Payer: Self-pay | Admitting: Internal Medicine

## 2013-11-18 ENCOUNTER — Ambulatory Visit: Payer: Self-pay | Admitting: Internal Medicine

## 2014-04-30 ENCOUNTER — Emergency Department: Admit: 2014-04-30 | Discharge status: Against Medical Advice | Payer: Self-pay | Admitting: Emergency Medicine

## 2014-04-30 LAB — COMPREHENSIVE METABOLIC PANEL
ANION GAP: 5 — AB (ref 7–16)
Albumin: 4.2 g/dL
Alkaline Phosphatase: 109 U/L
BILIRUBIN TOTAL: 0.4 mg/dL
BUN: 8 mg/dL
CREATININE: 0.93 mg/dL
Calcium, Total: 9 mg/dL
Chloride: 106 mmol/L
Co2: 26 mmol/L
GLUCOSE: 115 mg/dL — AB
Potassium: 3.4 mmol/L — ABNORMAL LOW
SGOT(AST): 15 U/L
SGPT (ALT): 10 U/L — ABNORMAL LOW
SODIUM: 137 mmol/L
TOTAL PROTEIN: 7.6 g/dL

## 2014-04-30 LAB — CBC WITH DIFFERENTIAL/PLATELET
Basophil #: 0.1 10*3/uL (ref 0.0–0.1)
Basophil %: 0.8 %
EOS PCT: 1.4 %
Eosinophil #: 0.1 10*3/uL (ref 0.0–0.7)
HCT: 43.7 % (ref 35.0–47.0)
HGB: 14.6 g/dL (ref 12.0–16.0)
LYMPHS ABS: 4 10*3/uL — AB (ref 1.0–3.6)
Lymphocyte %: 46.6 %
MCH: 31.5 pg (ref 26.0–34.0)
MCHC: 33.5 g/dL (ref 32.0–36.0)
MCV: 94 fL (ref 80–100)
MONOS PCT: 6.7 %
Monocyte #: 0.6 x10 3/mm (ref 0.2–0.9)
NEUTROS ABS: 3.8 10*3/uL (ref 1.4–6.5)
Neutrophil %: 44.5 %
Platelet: 240 10*3/uL (ref 150–440)
RBC: 4.65 10*6/uL (ref 3.80–5.20)
RDW: 13.9 % (ref 11.5–14.5)
WBC: 8.6 10*3/uL (ref 3.6–11.0)

## 2014-04-30 LAB — TROPONIN I

## 2014-04-30 LAB — LIPASE, BLOOD: Lipase: 30 U/L

## 2014-05-01 LAB — URINALYSIS, COMPLETE
BLOOD: NEGATIVE
Bilirubin,UR: NEGATIVE
Glucose,UR: NEGATIVE mg/dL (ref 0–75)
KETONE: NEGATIVE
Nitrite: NEGATIVE
PROTEIN: NEGATIVE
Ph: 5 (ref 4.5–8.0)
Specific Gravity: 1.006 (ref 1.003–1.030)

## 2014-05-04 ENCOUNTER — Emergency Department
Admit: 2014-05-04 | Disposition: A | Discharge status: Home / Self Care | Payer: Self-pay | Admitting: Emergency Medicine

## 2014-05-04 LAB — COMPREHENSIVE METABOLIC PANEL
ANION GAP: 10 (ref 7–16)
Albumin: 4.2 g/dL
Alkaline Phosphatase: 114 U/L
BUN: 8 mg/dL
Bilirubin,Total: 0.4 mg/dL
CHLORIDE: 104 mmol/L
CREATININE: 0.92 mg/dL
Calcium, Total: 8.9 mg/dL
Co2: 25 mmol/L
EGFR (Non-African Amer.): >60
Glucose: 115 mg/dL — ABNORMAL HIGH
Potassium: 4.1 mmol/L
SGOT(AST): 15 U/L
SGPT (ALT): 11 U/L — ABNORMAL LOW
Sodium: 139 mmol/L
TOTAL PROTEIN: 7.4 g/dL

## 2014-05-04 LAB — URINALYSIS, COMPLETE
BLOOD: NEGATIVE
Bacteria: NONE SEEN
Bilirubin,UR: NEGATIVE
Glucose,UR: NEGATIVE mg/dL (ref 0–75)
KETONE: NEGATIVE
NITRITE: NEGATIVE
PROTEIN: NEGATIVE
Ph: 5 (ref 4.5–8.0)
SPECIFIC GRAVITY: 1.02 (ref 1.003–1.030)

## 2014-05-04 LAB — TROPONIN I: Troponin-I: <0.03 ng/mL

## 2014-05-04 LAB — CBC WITH DIFFERENTIAL/PLATELET
BASOS ABS: 0.1 10*3/uL (ref 0.0–0.1)
BASOS PCT: 0.8 %
Eosinophil #: 0.1 10*3/uL (ref 0.0–0.7)
Eosinophil %: 1.2 %
HCT: 43.1 % (ref 35.0–47.0)
HGB: 14.3 g/dL (ref 12.0–16.0)
LYMPHS ABS: 4.1 10*3/uL — AB (ref 1.0–3.6)
Lymphocyte %: 45 %
MCH: 31.3 pg (ref 26.0–34.0)
MCHC: 33.1 g/dL (ref 32.0–36.0)
MCV: 95 fL (ref 80–100)
MONOS PCT: 5.6 %
Monocyte #: 0.5 x10 3/mm (ref 0.2–0.9)
Neutrophil #: 4.3 10*3/uL (ref 1.4–6.5)
Neutrophil %: 47.4 %
PLATELETS: 248 10*3/uL (ref 150–440)
RBC: 4.56 10*6/uL (ref 3.80–5.20)
RDW: 14.2 % (ref 11.5–14.5)
WBC: 9 10*3/uL (ref 3.6–11.0)

## 2014-05-04 LAB — LIPASE, BLOOD: Lipase: 37 U/L

## 2014-05-06 LAB — URINE CULTURE

## 2014-05-09 NOTE — H&P (Signed)
PATIENT NAME:  Brandy Robinson, Brandy Robinson MR#:  [id number] DATE OF BIRTH:  05-04-1965  DATE OF ADMISSION:  03/03/2012  REFERRING PHYSICIAN: Dr. Daryel November ACCEPTING PHYSICIAN: Dr. Margarita Rana   ATTENDING PHYSICIAN:  Denia Mcvicar B. Elodie Panameno, MD  IDENTIFYING DATA: The patient is a 49 year old female with history of depression.   CHIEF COMPLAINT: "I feel so much better now."   HISTORY OF PRESENT ILLNESS:  The patient has been diagnosed with depression. She is a patient at [facility]. She reports that that were frequent medication changes as she has been making very slow progress. Recently, she became increasingly irritable, short and argumentative with her husband to the point that the husband left the house.  She was trying to call her psychiatrist at [facility], but due snow she was unable to meet with Dr. Rogers Blocker.  She became increasingly depressed and suicidal and decided to come to the hospital for help. She reports poor sleep, decreased appetite, anhedonia, feeling of guilt, hopelessness, worthlessness, irritability, poor energy, and concentration, social isolation and crying spells.  She also reports being increasingly irritable and short with her husband that led to multiple arguments. She became exacerbated to the point that in front of her husband, she superficially cut her arm.    PAST PSYCHIATRIC HISTORY: She has never been hospitalized. There were no suicide attempts and no psychiatric treatment prior to recent contact with [facility].  She denies alcohol, illicit substance or prescription drug abuse.   FAMILY PSYCHIATRIC HISTORY: Mother with some mental problems, never diagnosed.   PAST MEDICAL HISTORY: None.   ALLERGIES: No known drug allergies.   MEDICATIONS ON ADMISSION: Celexa 20 mg, clonazepam 2 mg twice daily, Prilosec 20 mg daily, Estrace 1 mg daily.   SOCIAL HISTORY: The patient dropped out of school in the tenth grade. Her father was sick with throat cancer and she had to take care of  him. She never got her GED. She has been married for 25 years. She is a stay-at-home mom. She has one son who is 2 years old. She thought that she had a very supportive husband until she him ran him off with her irritability. The husband is now back at home, and he has been calling and visiting. She feels that the conflict and the crisis have been resolved.   REVIEW OF SYSTEMS:  CONSTITUTIONAL: No fevers or chills. No weight changes.  EYES: No double or blurred vision.  ENT: No hearing loss.  RESPIRATORY: No shortness of breath or cough.  CARDIOVASCULAR: No chest pain or orthopnea.  GASTROINTESTINAL: No abdominal pain, nausea, vomiting or diarrhea.  GENITOURINARY: No incontinence or frequency.  ENDOCRINE: No heat or cold intolerance.  LYMPHATIC: No anemia or easy bruising.  INTEGUMENTARY: No acne or rash.  MUSCULOSKELETAL: No muscle or joint pain.  NEUROLOGIC: No tingling or weakness.  PSYCHIATRIC: See history of present illness for details.   PHYSICAL EXAMINATION: VITAL SIGNS: Blood pressure 113/80, pulse 111, respirations 20, temperature 98.2.  GENERAL: This is in a slender female in no acute distress.  HEENT: The pupils are equal, round and reactive to light. Sclerae anicteric.  NECK: Supple. No thyromegaly.  LUNGS: Clear to auscultation. No dullness to percussion.  HEART: Regular rhythm and rate. No murmurs, rubs, or gallops.  ABDOMEN: Soft, nontender, nondistended. Positive bowel sounds.  MUSCULOSKELETAL: Normal muscle strength in all extremities.  SKIN: No rashes or bruises. Superficial cuts on the left forearm.  LYMPHATIC: No cervical adenopathy.  NEUROLOGIC: Cranial nerves II through XII are intact.  LABORATORY DATA: Chemistries are within normal limits. Blood alcohol level is zero. LFTs within normal limits, except for alkaline phosphatase of 179. TSH 1.38. Urine tox screen negative for substances. CBC within normal limits, except for white blood count of 11.7. Urinalysis is  not suggestive of urinary tract infection. Serum acetaminophen less than 5. Serum salicylates 15.1.  MENTAL STATUS EXAMINATION ON ADMISSION: The patient is alert and oriented to person, place, time and situation. She is pleasant, polite and cooperative. She is well groomed and casually dressed. She maintains good eye contact. Her speech is of normal rhythm, rate and volume. Mood is "much better" with a full affect. Thought processing is logical and goal oriented. Thought content: She denies suicidal or homicidal ideation, but was admitted after a suicide attempt by cutting. There are no delusions or paranoia. There are no auditory or visual hallucinations. Her cognition is grossly intact. She registers 3 out of 3 and recalls 3 out of 3 objects after 5 minutes. She can spell "world" forward and backward. She knows the current president. Her insight and judgment are questionable.   SUICIDE RISK ASSESSMENT ON ADMISSION: This is a patient with new onset depression who became irritable, increasingly depressed and suicidal in the context of marital conflict and recent medication changes.   DIAGNOSES: AXIS I: Major depressive episode, anxiety disorder, not otherwise specified.   AXIS II: Deferred.  AXIS III: Gastroesophageal reflux disease.  AXIS IV: Mental illness, family conflict, marital conflict.  AXIS V: GAF 25.   PLAN: The patient was admitted to [facility] unit for safety, stabilization and medication management. She was initially placed on suicide precautions and was closely monitored for any unsafe behaviors. She underwent full psychiatric and risk assessment. She received pharmacotherapy, individual and group psychotherapy, substance abuse counseling and support from therapeutic milieu.  1.  Suicidal ideation. This has resolved. The patient is able to contract for safety.  2.  Mood and anxiety. The patient was continued on Celexa by Dr. Guss Bundehalla. She also  changed Klonopin 2 mg twice daily to Klonopin 1 mg 3 times daily with excellent results.   DISPOSITION: The patient will be discharged to home. [facility].   ____________________________ Ellin GoodieJolanta B. Jennet MaduroPucilowska, MD jbp:cc D: 03/05/2012 20:45:46 ET T: 03/05/2012 21:57:31 ET JOB#: [id number]  cc: Helyn Schwan B. Jennet MaduroPucilowska, MD, <Dictator> Shari ProwsJOLANTA B Mirha Brucato MD ELECTRONICALLY SIGNED 03/15/2012 6:42

## 2014-05-09 NOTE — H&P (Signed)
DATE OF BIRTH:  [birth year]  DATE OF ADMISSION:  03/03/2012  AGE:  49 years  SEX:   Female  RACE:   White  INITIAL ASSESSMENT:  Psychiatric evaluation.  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  The patient is married for 25 years, and has been living with her husband, with whom she had been having arguments because she has been running her mouth. The patient was admitted to  Psychiatry at [facility] on IVC that states that patient wants to kill herself and had a knife, trying to cut her arm, and sleeps excessively.   HISTORY OF PRESENT ILLNESS:  When patient was asked when she last felt bad, she reported for the past 8 months she had been feeling low and down, but recently she has been more depressed and having arguments with her husband constantly, so he moved out to live by himself in an apartment. Then he came back and he and their son went out sledding, and went out to have fun and go out to eat, and this upset the patient, so when the husband came back on Friday, 03/02/2012, to pick up clothes, she got upset and she used a kitchen knife to have abrasions so that it would draw attention and bring him back home, as she did not want him to leave. Then he took out an IVC and brought her here for admission.  PAST PSYCHIATRIC HISTORY:  No previous history of inpatient hospital psychiatry.  No history of suicide attempt. Being followed for depression since October 2013 . Last appointment was 2 days ago, and was rescheduled because of bad weather to today, that is 03/03/2012.    FAMILY HISTORY OF MENTAL ILLNESS:  Mother had problems with anger and depression, but was not seeking care.  No known history of suicide in the family.  FAMILY HISTORY:  Raised by parents.  Father worked  for a label company. Father died at the age of 47 with throat cancer. Mother is living at 47 years old, as are 2 siblings living.  Has a older sister and  older brother/.   PERSONAL HISTORY:  [facility]. Dropped out in 10th grade because she had to take her father back and forth for his appointments. No GED. 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 This job lasted for a year.  Longest job she has held was for [employer], was a Programmer, systemsticket clerk.  This job lasted for 4 years, and quit because she was tired and it was 3rd shift.  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 Military history:  None.  Married once, for 26 years. Husband is employed as a Education officer, environmentalpastor. They have a son together, who is 47 years old. Recently having conflicts with son and husband.  Alcohol and drugs:  Patient reports that she does not drink alcohol. Denies street or prescription drug abuse. Denies use of IV drugs. Smokes cigarettes at the rate of   to 1 pack a day for many years.   PAST MEDICAL HISTORY:  No known high blood pressure. No known diabetes mellitus.  Status post hysterectomy.  Status post cellulitis of right arm, and had surgery for the same. That is remote.  No history of motor accidents.  Never been unconscious.  ALLERGIES:  No known drug allergies.   Being followed by Dr. Lockie Pares. Last appointment was a few months ago. Next appointment is to be made.   PHYSICAL EXAMINATION:  VITAL SIGNS:  Temperature is 97.4, pulse is 86 per minute, regular, respirations 24, regular, blood pressure 130/80 mmHg. HEENT:  Head is normocephalic and atraumatic.  Eyes: PERRLA. Fundi benign. EOMs visualized. Tympanic membranes:  There is no exudate. NECK:  Supple, without any organomegaly, lymphadenopathy or thyromegaly.  CHEST:  Normal expansion, normal breath sounds heard. HEART:  Normal S1, S2, without any murmurs or gallops. ABDOMEN:  Soft. No organomegaly. Bowel sounds heard. Scar from  hysterectomy healed well.  RECTAL AND PELVIC:  Deferred. NEUROLOGIC:  Gait is normal. Romberg not tested.  Cranial nerves II through XII appear to be normal.  DTRs 2+ and normal.  Plantars are normal response.  MENTAL STATUS EXAMINATION:  The patient is dressed in hospital scrubs. Alert and oriented to place, person and time. Aware of the situation that brought her for admission to [facility]. Affect appropriate with mood, which is low, down and depressed about the constant arguments with her husband and problems with the same. Admits feeling hopeless and helpless at times. Admits to worthless and useless at times. Denies suicidal or homicidal plans, and reports that she wanted attention from her husband and wanted him to come back. No evident psychosis. Denies auditory or visual hallucinations or hearing voices.  Denies paranoid or suspicious ideas. Denies any thought insertion or thought control. Denies having any grandiose ideas.  Could spell the word "world" forward and backward without any problems. Could count money. General knowledge information is fair.  Judgment is intact.  For fire, she reported she would leave. Memory and recall are good. Cognition is intact  Appetite has been poor lately, and sleep has been disturbed. Insight and judgment guarded.  IMPRESSIONS:  Axis I:  Major depressive disorder, single episode. Marital conflicts. Nicotine dependence. Axis II:  Deferred. Axis III:  Status post hysterectomy. Status post surgery for cellulitis on right upper arm. Status post sciatica and pain in right leg, which is resolved. Axis IV:  Marital conflicts are leading to constant arguments. Depression related to same. Axis V:  GAF 30.  PLAN:  The patient is admitted to [facility] for close observation. She will be started on antidepressant medication, which will help with her mood and also with irritability, so that she can feel better and she will not be depressed, and she will be able to  rest better, feel better, and appetite will improve. During her stay in the hospital, she will be given milieu therapy and supportive counseling with coping skills, and marital conflicts and resolution will be discussed. Social Services will contact her husband so that they can have a marital session and also a family session, which brings the family together so that they will have better understanding for each other and patient will be stabilized and will be discharged with appropriate followup appointment.     ____________________________ Jannet Mantis. Guss Bunde, MD skc:mr D: 03/03/2012 18:48:00 ET T: 03/03/2012 21:32:24 ET JOB#: [id number]  cc: Monika Salk K. Guss Bunde, MD, <Dictator> Beau Fanny MD ELECTRONICALLY SIGNED 03/10/2012 15:38

## 2014-05-11 NOTE — H&P (Signed)
    Subjective/Chief Complaint right breast pain and swelling    History of Present Illness 49 y/o female with 1 week of right breast pain, redness and increasing pain.    Past History none   Past Med/Surgical Hx:  acid reflux:   Panic Attacks:   depression:   Hysterectomy, total:   ALLERGIES:  Vicodin: Other    Other Allergies none   HOME MEDICATIONS: Medication Instructions Status  Paxil 40 mg oral tablet 1 tab(s) orally once a day Active  Klonopin 2 mg oral tablet 1 tab(s) orally 3 times a day Active  esterified estrogens-methyltestosterone 0.625 mg-1.25 mg oral tablet 1 tab(s) orally once a day Active   Family and Social History:   Family History Non-Contributory    Social History positive  tobacco, positive  tobacco (Current within 1 year), negative ETOH    + Tobacco Current (within 1 year)    Place of Living Home   Review of Systems:   Subjective/Chief Complaint see above   Physical Exam:   GEN no acute distress, disheveled    HEENT pale conjunctivae    NECK supple    RESP normal resp effort    CARD regular rate    ABD denies tenderness  small amount redness along lowermost portion of lower midline scar.    LYMPH negative neck    SKIN normal to palpation, large area of right breat induration, erythema and tenderness most of right UOQ.    NEURO cranial nerves intact    PSYCH A+O to time, place, person     Assessment/Admission Diagnosis 49 y/o with large right breast abscess    Plan admit, IV zosyn and vanco, kpad.  may need drainage. discussed in detail with pt/husband.   Electronic Signatures: Natale LayBird, Carrissa Taitano (MD)  (Signed 08-Apr-13 08:05)  Authored: CHIEF COMPLAINT and HISTORY, PAST MEDICAL/SURGIAL HISTORY, ALLERGIES, Other Allergies, HOME MEDICATIONS, FAMILY AND SOCIAL HISTORY, REVIEW OF SYSTEMS, PHYSICAL EXAM, ASSESSMENT AND PLAN   Last Updated: 08-Apr-13 08:05 by Natale LayBird, Corneluis Allston (MD)

## 2014-05-11 NOTE — Op Note (Signed)
PATIENT NAME:  Brandy Robinson, Brandy Robinson DATE OF BIRTH:  10-02-1965  DATE OF PROCEDURE:  04/28/2011  PREOPERATIVE DIAGNOSIS: Right breast abscess with necrotic skin.     POSTOPERATIVE DIAGNOSIS: Right breast abscess with necrotic skin.   PROCEDURES:  1. Incision and drainage of deep right breast abscess.  2. Debridement of 3 x 11 cm necrotic skin and subcutaneous tissue.   SURGEON: Claude MangesWilliam F. Eissa Buchberger, M.D.   ANESTHESIA: General.   PROCEDURE IN DETAIL: The patient was placed supine on the Operating Room table and prepped and draped in the usual sterile fashion. A 3 cm wide x 11 cm long elliptical incision was made surrounding the confluent areas of necrotic skin and spontaneously draining areas and as this was excised, some necrotic subcutaneous tissue and breast tissue was excised with it. A culture was obtained and then all of the necrotic debris within the breast was excised and hemostasis was achieved with the electrocautery. The drainage was wide, but the amount of pus was small. The wound was then packed with saline-soaked cling sponge with an overlying fluffed up Kerlix and snug-fitting bra completing the procedure. The patient tolerated the procedure well. There were no complications.   ____________________________ Claude MangesWilliam F. Clementine Soulliere, MD wfm:ap D: 04/28/2011 23:08:17 ET T: 04/29/2011 13:35:48 ET JOB#: [id number]  cc: Claude MangesWilliam F. Caera Enwright, MD, <Dictator> Claude MangesWILLIAM F Ketzia Guzek MD ELECTRONICALLY SIGNED 05/01/2011 19:29

## 2014-05-11 NOTE — Discharge Summary (Signed)
PATIENT NAME:  Brandy Robinson, Brandy Robinson MR#:  [id number] DATE OF BIRTH:  1965-02-24  DATE OF ADMISSION:  04/25/2011 DATE OF DISCHARGE:  04/29/2011  PRINCIPLE DIAGNOSIS: Staphylococcus aureus right breast abscess with necrotic overlying skin.   ADDITIONAL DIAGNOSES:  1. Gastroesophageal reflux disease. 2. History of depression. 3. History of panic attacks.  4. Status post hysterectomy.   PRINCIPLE PROCEDURES PERFORMED DURING ADMISSION:  1. Incision and drainage of deep right breast abscess, on 04/28/2011 2. Debridement of 3 x 11 cm necrotic skin and subcutaneous tissue, on 04/28/2011.   HOSPITAL COURSE: Brandy Robinson was admitted to the hospital and put on IV antibiotics. Her breast was spontaneously draining, but ultimately came to surgery on 04/28/2011. By postoperative day one, she was stable with no fever, her wound looked good, and she had a decrease in her erythema and tenderness, her medications were switched to p.o., and she was discharged home. She was asked to make a follow-up an appointment with my office.  ____________________________ Claude MangesWilliam F. Pinki Rottman, MD wfm:slb D: 05/11/2011 12:22:26 ET T: 05/11/2011 12:27:43 ET JOB#: [id number]  cc: Claude MangesWilliam F. Yamilett Anastos, MD, <Dictator> Claude MangesWILLIAM F Madiline Saffran MD ELECTRONICALLY SIGNED 05/11/2011 18:15

## 2014-05-15 ENCOUNTER — Emergency Department
Admit: 2014-05-15 | Disposition: A | Discharge status: Home / Self Care | Payer: Self-pay | Admitting: Emergency Medicine

## 2014-05-15 LAB — COMPREHENSIVE METABOLIC PANEL
ALK PHOS: 103 U/L
ANION GAP: 7 (ref 7–16)
Albumin: 3.7 g/dL
BILIRUBIN TOTAL: 0.4 mg/dL
BUN: 10 mg/dL
CALCIUM: 9.3 mg/dL
CHLORIDE: 110 mmol/L
Co2: 22 mmol/L
Creatinine: 0.78 mg/dL
EGFR (African American): >60
Glucose: 88 mg/dL
Potassium: 4 mmol/L
SGOT(AST): 17 U/L
SGPT (ALT): 11 U/L — ABNORMAL LOW
Sodium: 139 mmol/L
Total Protein: 7.2 g/dL

## 2014-05-15 LAB — CBC WITH DIFFERENTIAL/PLATELET
BASOS ABS: 0.1 10*3/uL (ref 0.0–0.1)
BASOS PCT: 0.6 %
Eosinophil #: 0.1 10*3/uL (ref 0.0–0.7)
Eosinophil %: 1.1 %
HCT: 40.6 % (ref 35.0–47.0)
HGB: 13.6 g/dL (ref 12.0–16.0)
LYMPHS PCT: 40.3 %
Lymphocyte #: 4.1 10*3/uL — ABNORMAL HIGH (ref 1.0–3.6)
MCH: 31.5 pg (ref 26.0–34.0)
MCHC: 33.4 g/dL (ref 32.0–36.0)
MCV: 94 fL (ref 80–100)
MONOS PCT: 6.3 %
Monocyte #: 0.6 x10 3/mm (ref 0.2–0.9)
NEUTROS ABS: 5.3 10*3/uL (ref 1.4–6.5)
NEUTROS PCT: 51.7 %
Platelet: 281 10*3/uL (ref 150–440)
RBC: 4.31 10*6/uL (ref 3.80–5.20)
RDW: 13.8 % (ref 11.5–14.5)
WBC: 10.2 10*3/uL (ref 3.6–11.0)

## 2014-05-15 LAB — URINALYSIS, COMPLETE
Bacteria: NONE SEEN
Bilirubin,UR: NEGATIVE
Blood: NEGATIVE
Glucose,UR: NEGATIVE mg/dL (ref 0–75)
KETONE: NEGATIVE
Nitrite: NEGATIVE
PH: 5 (ref 4.5–8.0)
PROTEIN: NEGATIVE
RBC, UR: NONE SEEN /HPF (ref 0–5)
Specific Gravity: 1.006 (ref 1.003–1.030)

## 2014-05-15 LAB — TROPONIN I: Troponin-I: <0.03 ng/mL

## 2014-05-15 LAB — LIPASE, BLOOD: Lipase: 35 U/L

## 2014-06-17 ENCOUNTER — Encounter: Payer: Self-pay | Admitting: Pharmacist

## 2014-06-26 ENCOUNTER — Encounter: Payer: Self-pay | Admitting: Pharmacist

## 2014-07-17 ENCOUNTER — Encounter: Payer: Self-pay | Admitting: Pharmacist

## 2014-07-24 ENCOUNTER — Encounter (INDEPENDENT_AMBULATORY_CARE_PROVIDER_SITE_OTHER): Payer: Self-pay

## 2014-07-24 ENCOUNTER — Encounter: Payer: Self-pay | Admitting: Pharmacist

## 2014-08-25 ENCOUNTER — Telehealth: Payer: Self-pay | Admitting: Obstetrics and Gynecology

## 2014-08-25 MED ORDER — ESTRADIOL 0.5 MG PO TABS: 0.5000 mg | ORAL_TABLET | Freq: Every day | ORAL | Status: DC

## 2014-08-25 NOTE — Telephone Encounter (Signed)
Pt aware med phoned in (spoke with [place] ) .

## 2014-08-25 NOTE — Telephone Encounter (Signed)
Med had to be phone in. Medication management is not in sys- LM for them to contact me.

## 2014-08-25 NOTE — Telephone Encounter (Signed)
PT NEEDS HER ESTRADIOL 1 MG REFILLED. SHE HAS AN AE SCHEDULE FOR 9/7 AND DONT HAVE ENOUGH TILL THEN.  MEDICATION MNGT (FORMLY ALAMAP)

## 2014-09-24 ENCOUNTER — Ambulatory Visit (INDEPENDENT_AMBULATORY_CARE_PROVIDER_SITE_OTHER): Payer: Self-pay | Admitting: Obstetrics and Gynecology

## 2014-09-24 ENCOUNTER — Encounter: Payer: Self-pay | Admitting: Obstetrics and Gynecology

## 2014-09-24 VITALS — BP 107/68 | HR 105 | Ht 63.0 in | Wt 152.2 lb

## 2014-09-24 DIAGNOSIS — N952 Postmenopausal atrophic vaginitis: Secondary | ICD-10-CM

## 2014-09-24 DIAGNOSIS — N958 Other specified menopausal and perimenopausal disorders: Secondary | ICD-10-CM

## 2014-09-24 DIAGNOSIS — Z0000 Encounter for general adult medical examination without abnormal findings: Secondary | ICD-10-CM

## 2014-09-24 DIAGNOSIS — E8940 Asymptomatic postprocedural ovarian failure: Secondary | ICD-10-CM

## 2014-09-24 DIAGNOSIS — Z01419 Encounter for gynecological examination (general) (routine) without abnormal findings: Secondary | ICD-10-CM

## 2014-09-24 DIAGNOSIS — Z1231 Encounter for screening mammogram for malignant neoplasm of breast: Secondary | ICD-10-CM

## 2014-09-24 DIAGNOSIS — L900 Lichen sclerosus et atrophicus: Secondary | ICD-10-CM

## 2014-09-24 DIAGNOSIS — Z8711 Personal history of peptic ulcer disease: Secondary | ICD-10-CM

## 2014-09-24 DIAGNOSIS — N809 Endometriosis, unspecified: Secondary | ICD-10-CM

## 2014-09-24 DIAGNOSIS — K219 Gastro-esophageal reflux disease without esophagitis: Secondary | ICD-10-CM | POA: Insufficient documentation

## 2014-09-24 DIAGNOSIS — G4700 Insomnia, unspecified: Secondary | ICD-10-CM

## 2014-09-24 DIAGNOSIS — F319 Bipolar disorder, unspecified: Secondary | ICD-10-CM

## 2014-09-24 DIAGNOSIS — Z8719 Personal history of other diseases of the digestive system: Secondary | ICD-10-CM

## 2014-09-24 MED ORDER — ESTROGENS, CONJUGATED 0.625 MG/GM VA CREA: 1.0000 | TOPICAL_CREAM | VAGINAL | Status: DC

## 2014-09-24 MED ORDER — ESTRADIOL 0.5 MG PO TABS: 0.5000 mg | ORAL_TABLET | Freq: Every day | ORAL | Status: DC

## 2014-09-24 MED ORDER — CLOBETASOL PROPIONATE 0.05 % EX OINT: 1.0000 "application " | TOPICAL_OINTMENT | CUTANEOUS | Status: DC

## 2014-09-24 MED ORDER — ESTRADIOL 1 MG PO TABS: 1.0000 mg | ORAL_TABLET | Freq: Every day | ORAL | Status: DC

## 2014-09-24 NOTE — Progress Notes (Signed)
Patient ID: Brandy Robinson, female   DOB: [birth year], 49 y.o.   MRN: [id number] ANNUAL PREVENTATIVE CARE GYN  ENCOUNTER NOTE  Subjective:       Brandy Robinson is a 49 y.o. No obstetric history on file. female here for a routine annual gynecologic exam.  Current complaints: 1.  Painful IC 2.  Vasomotor symptoms. 3.  Lichen sclerosis  Allscripts summary:the patient is a 49 year old white female, status post TAH/BSO for endometriosis, on estradiol 0.5 mg daily.  4.  Surgical menopause with suboptimal control of vasomotor symptoms, also using Premarin cream intravaginal twice a week for vaginal atrophy symptoms, suboptimal treatment, presents for annual evaluation. Patient also has history of lichen sclerosis which is stable with Temovate ointment use twice weekly. Patient has lost 20 pounds in the past year with dietary modification and increased water intake.   Gynecologic History No LMP recorded. Patient has had a hysterectomy.TAH/BSO Contraception: status post hysterectomyTAH/BSO Last Pap: unsure. Results were: normal Last mammogram: unsure. Results were: normal History of endometriosis Lichen sclerosis  Obstetric History OB History  No data available    Past Medical History  Diagnosis Date  . Wears glasses   . Depression   . Anxiety   . GERD (gastroesophageal reflux disease)   . Vaginal atrophy   . Lichen   . Surgical menopause   . Endometriosis   . Dyspareunia   . Insomnia     Past Surgical History  Procedure Laterality Date  . Abdominal hysterectomy  1995    tah-bso  . Arm wound repair / closure  2013    cellulitis rt arm-i/d  . Knee bursectomy Left 09/24/2013    Procedure: IRRIGATION AND DEBRIDEMENT OF LEFT KNEE ;  Surgeon: Sheral Apley, MD;  Location: [facility];  Service: Orthopedics;  Laterality: Left;    Current Outpatient Prescriptions on File Prior to Visit  Medication Sig Dispense Refill  . citalopram (CELEXA) 20 MG tablet Take 20 mg  by mouth daily.    . clonazePAM (KLONOPIN) 1 MG tablet Take 1 mg by mouth at bedtime.    Marland Kitchen estradiol (ESTRACE) 0.5 MG tablet Take 1 tablet (0.5 mg total) by mouth at bedtime. 30 tablet 0  . omeprazole (PRILOSEC) 20 MG capsule Take 20 mg by mouth 2 (two) times daily before a meal.    . QUEtiapine (SEROQUEL) 50 MG tablet Take 50 mg by mouth 2 (two) times daily. Rapid release     No current facility-administered medications on file prior to visit.    Allergies  Allergen Reactions  . Hydrocodone Itching  . Aspirin Other (See Comments)    Stomach hurts  . Morphine And Related Other (See Comments)    Chest pain    Social History   Social History  . Marital Status: Married    Spouse Name: N/A  . Number of Children: N/A  . Years of Education: N/A   Occupational History  . Not on file.   Social History Main Topics  . Smoking status: Current Every Day Smoker -- 1.00 packs/day    Types: Cigarettes  . Smokeless tobacco: Not on file  . Alcohol Use: No  . Drug Use: No  . Sexual Activity: Yes    Birth Control/ Protection: Surgical   Other Topics Concern  . Not on file   Social History Narrative    Family History  Problem Relation Age of Onset  . Diabetes Mother   . Diabetes Sister   . Cancer Neg  Hx   . Heart disease Neg Hx     The following portions of the patient's history were reviewed and updated as appropriate: allergies, current medications, past family history, past medical history, past social history, past surgical history and problem list.  Review of Systems ROS Review of Systems - General ROS: negative for - chills, fatigue, fever, hot flashes, night sweats, weight gain or weight loss Psychological ROS: negative for - anxiety, decreased libido, depression, mood swings, physical abuse or sexual abuse Ophthalmic ROS: negative for - blurry vision, eye pain or loss of vision ENT ROS: negative for - headaches, hearing change, visual changes or vocal changes Allergy  and Immunology ROS: negative for - hives, itchy/watery eyes or seasonal allergies Hematological and Lymphatic ROS: negative for - bleeding problems, bruising, swollen lymph nodes or weight loss Endocrine ROS: negative for - galactorrhea, hair pattern changes, hot flashes, malaise/lethargy, mood swings, palpitations, polydipsia/polyuria, skin changes, temperature intolerance or unexpected weight changes Breast ROS: negative for - new or changing breast lumps or nipple discharge Respiratory ROS: negative for - cough or shortness of breath Cardiovascular ROS: negative for - chest pain, irregular heartbeat, palpitations or shortness of breath Gastrointestinal ROS: no abdominal pain, change in bowel habits, or black or bloody stools Genito-Urinary ROS: no dysuria, trouble voiding, or hematuria Musculoskeletal ROS: negative for - joint pain or joint stiffness Neurological ROS: negative for - bowel and bladder control changes Dermatological ROS: negative for rash and skin lesion changes   Objective:   BP 107/68 mmHg  Pulse 105  Ht 5\' 3"  (1.6 m)  Wt 152 lb 3.2 oz (69.037 kg)  BMI 26.97 kg/m2 CONSTITUTIONAL: Well-developed, well-nourished female in no acute distress.  PSYCHIATRIC: Normal mood and affect. Normal behavior. Normal judgment and thought content. NEUROLGIC: Alert and oriented to person, place, and time. Normal muscle tone coordination. No cranial nerve deficit noted. HENT:  Normocephalic, atraumatic, External right and left ear normal. Oropharynx is clear and moist EYES: Conjunctivae and EOM are normal. Pupils are equal, round, and reactive to light. No scleral icterus.  NECK: Normal range of motion, supple, no masses.  Normal thyroid.  SKIN: Skin is warm and dry. No rash noted. Not diaphoretic. No erythema. No pallor. CARDIOVASCULAR: Normal heart rate noted, regular rhythm, no murmur. RESPIRATORY: Clear to auscultation bilaterally. Effort and breath sounds normal, no problems with  respiration noted. BREASTS: Symmetric in size. No masses, skin changes, nipple drainage, or lymphadenopathy. ABDOMEN: Soft, normal bowel sounds, no distention noted.  No tenderness, rebound or guarding. Slightly protuberant; midline scars well-healed without evidence of hernia BLADDER: Normal PELVIC:  External Genitalia: Normal; introitus, slightly narrowed  BUS: Normal  Vagina: mild atrophic changes  Cervix: surgically absent  Uterus: surgically absent  Adnexa: nonpalpable, nontender  RV: External Exam NormaI, No Rectal Masses and Normal Sphincter tone  MUSCULOSKELETAL: Normal range of motion. No tenderness.  No cyanosis, clubbing, or edema.  2+ distal pulses. LYMPHATIC: No Axillary, Supraclavicular, or Inguinal Adenopathy.    Assessment:   Annual gynecologic examination 49 y.o. Contraception: status post hysterectomy Normal BMI Lichen sclerosis, stable. Vasomotor symptoms suboptimally controlled with 0.5 mg estradiol Vaginal atrophy symptoms suboptimally controlled with twice weekly Premarin cream  Plan:  Pap: not done Mammogram: Ordered Stool Guaiac Testing:  Not Indicated Labs: charles drew  Routine preventative health maintenance measures emphasized: Exercise/Diet/Weight control, Tobacco Warnings and Alcohol/Substance use risks Estradiol is increased to 1 mg a day. Premarin vaginal cream is increased to 3 times weekly. Temovate ointment is to be  continued with twice weekly application Return to Clinic - 1 [address] [place], [place]  Herold Harms, MD

## 2014-09-25 ENCOUNTER — Telehealth: Payer: Self-pay

## 2014-09-25 ENCOUNTER — Other Ambulatory Visit: Payer: Self-pay

## 2014-09-25 MED ORDER — MOMETASONE FUROATE 0.1 % EX OINT: TOPICAL_OINTMENT | CUTANEOUS | Status: DC

## 2014-09-25 NOTE — Telephone Encounter (Signed)
Pharmacy had called and stated they could not fill Clobetasol order because it cost them $100 and pt was provided medication free. Is there something else they could give her? Dr. Algis Downs. States she could try Triamcinolone cream and this may not be as effective. LMTCO.

## 2014-09-25 NOTE — Progress Notes (Signed)
Pt unable to afford Clobetasol so substitute ordered.

## 2014-09-26 NOTE — Telephone Encounter (Signed)
Sent Rx for medication pt was on before that was covered: Mometasone (Elocon) 1% oint. Confirmation received.

## 2014-11-06 ENCOUNTER — Encounter: Payer: Self-pay | Admitting: Emergency Medicine

## 2014-11-06 ENCOUNTER — Emergency Department
Admission: EM | Admit: 2014-11-06 | Discharge: 2014-11-07 | Disposition: A | Discharge status: Home / Self Care | Payer: Self-pay | Attending: Emergency Medicine | Admitting: Emergency Medicine

## 2014-11-06 DIAGNOSIS — Z79890 Hormone replacement therapy (postmenopausal): Secondary | ICD-10-CM | POA: Insufficient documentation

## 2014-11-06 DIAGNOSIS — R1010 Upper abdominal pain, unspecified: Secondary | ICD-10-CM

## 2014-11-06 DIAGNOSIS — K3184 Gastroparesis: Secondary | ICD-10-CM | POA: Insufficient documentation

## 2014-11-06 DIAGNOSIS — D72829 Elevated white blood cell count, unspecified: Secondary | ICD-10-CM | POA: Insufficient documentation

## 2014-11-06 DIAGNOSIS — Z720 Tobacco use: Secondary | ICD-10-CM | POA: Insufficient documentation

## 2014-11-06 DIAGNOSIS — M549 Dorsalgia, unspecified: Secondary | ICD-10-CM | POA: Insufficient documentation

## 2014-11-06 DIAGNOSIS — Z79899 Other long term (current) drug therapy: Secondary | ICD-10-CM | POA: Insufficient documentation

## 2014-11-06 HISTORY — DX: Bipolar disorder, unspecified: F31.9

## 2014-11-06 LAB — COMPREHENSIVE METABOLIC PANEL
ALK PHOS: 148 U/L — AB (ref 38–126)
ALT: 12 U/L — AB (ref 14–54)
ANION GAP: 8 (ref 5–15)
AST: 16 U/L (ref 15–41)
Albumin: 3.8 g/dL (ref 3.5–5.0)
BILIRUBIN TOTAL: 0.4 mg/dL (ref 0.3–1.2)
BUN: 12 mg/dL (ref 6–20)
CALCIUM: 9.5 mg/dL (ref 8.9–10.3)
CO2: 27 mmol/L (ref 22–32)
CREATININE: 0.9 mg/dL (ref 0.44–1.00)
Chloride: 101 mmol/L (ref 101–111)
GFR calc non Af Amer: >60 mL/min (ref >60)
Glucose, Bld: 111 mg/dL — ABNORMAL HIGH (ref 65–99)
Potassium: 3.9 mmol/L (ref 3.5–5.1)
Sodium: 136 mmol/L (ref 135–145)
TOTAL PROTEIN: 8 g/dL (ref 6.5–8.1)

## 2014-11-06 LAB — URINALYSIS COMPLETE WITH MICROSCOPIC (ARMC ONLY)
BILIRUBIN URINE: NEGATIVE
GLUCOSE, UA: NEGATIVE mg/dL
HGB URINE DIPSTICK: NEGATIVE
KETONES UR: NEGATIVE mg/dL
NITRITE: NEGATIVE
Protein, ur: NEGATIVE mg/dL
SPECIFIC GRAVITY, URINE: 1.023 (ref 1.005–1.030)
pH: 5 (ref 5.0–8.0)

## 2014-11-06 LAB — CBC WITH DIFFERENTIAL/PLATELET
BASOS ABS: 0.1 10*3/uL (ref 0–0.1)
Basophils Relative: 1 %
EOS ABS: 0.1 10*3/uL (ref 0–0.7)
Eosinophils Relative: 1 %
HCT: 37.4 % (ref 35.0–47.0)
HEMOGLOBIN: 12.7 g/dL (ref 12.0–16.0)
LYMPHS ABS: 4.1 10*3/uL — AB (ref 1.0–3.6)
Lymphocytes Relative: 34 %
MCH: 31.7 pg (ref 26.0–34.0)
MCHC: 33.9 g/dL (ref 32.0–36.0)
MCV: 93.4 fL (ref 80.0–100.0)
Monocytes Absolute: 0.9 10*3/uL (ref 0.2–0.9)
Monocytes Relative: 7 %
NEUTROS PCT: 57 %
Neutro Abs: 6.8 10*3/uL — ABNORMAL HIGH (ref 1.4–6.5)
Platelets: 291 10*3/uL (ref 150–440)
RBC: 4 MIL/uL (ref 3.80–5.20)
RDW: 13.7 % (ref 11.5–14.5)
WBC: 12.1 10*3/uL — AB (ref 3.6–11.0)

## 2014-11-06 LAB — LIPASE, BLOOD: LIPASE: 33 U/L (ref 11–51)

## 2014-11-06 NOTE — ED Notes (Signed)
Pt presents to ED with epigastric pain that radiates around to her back; pain has been intermittent for the past 2 weeks. Pain worsened tonight. Nausea with vomiting X1. Denies diarrhea. Normal bowel movement today. Decrease in appetite. abd sore to touch.

## 2014-11-07 ENCOUNTER — Emergency Department: Payer: Self-pay

## 2014-11-07 MED ORDER — METOCLOPRAMIDE HCL 10 MG PO TABS: 10.0000 mg | ORAL_TABLET | Freq: Three times a day (TID) | ORAL | Status: DC

## 2014-11-07 MED ORDER — OXYCODONE-ACETAMINOPHEN 5-325 MG PO TABS: 1.0000 | ORAL_TABLET | Freq: Four times a day (QID) | ORAL | Status: DC | PRN

## 2014-11-07 MED ORDER — HYDROMORPHONE HCL 1 MG/ML IJ SOLN
1.0000 mg | Freq: Once | INTRAMUSCULAR | Status: AC
  Administered 2014-11-07: 1 mg via INTRAVENOUS
  Filled 2014-11-07: qty 1

## 2014-11-07 MED ORDER — HYDROMORPHONE HCL 1 MG/ML IJ SOLN
INTRAMUSCULAR | Status: AC
  Administered 2014-11-07: 1 mg via INTRAVENOUS
  Filled 2014-11-07: qty 1

## 2014-11-07 MED ORDER — ONDANSETRON HCL 4 MG/2ML IJ SOLN
4.0000 mg | Freq: Once | INTRAMUSCULAR | Status: AC
  Administered 2014-11-07: 4 mg via INTRAVENOUS
  Filled 2014-11-07: qty 2

## 2014-11-07 MED ORDER — HYDROMORPHONE HCL 1 MG/ML IJ SOLN: 1.0000 mg | Freq: Once | INTRAMUSCULAR | Status: DC

## 2014-11-07 MED ORDER — HYDROMORPHONE HCL 1 MG/ML IJ SOLN
1.0000 mg | Freq: Once | INTRAMUSCULAR | Status: AC
  Administered 2014-11-07: 1 mg via INTRAVENOUS

## 2014-11-07 MED ORDER — GI COCKTAIL ~~LOC~~
30.0000 mL | Freq: Once | ORAL | Status: AC
  Administered 2014-11-07: 30 mL via ORAL
  Filled 2014-11-07: qty 30

## 2014-11-07 NOTE — Discharge Instructions (Signed)
Please take your medications as prescribed. Please follow-up with GI medicine by calling the number provided to arrange a follow-up appointment as soon as possible. As we discussed please return to the emergency department for any increased abdominal pain, vomiting unable to keep down her medications, fever, or any other symptom personally concerning to your self.   Abdominal Pain, Adult Many things can cause abdominal pain. Usually, abdominal pain is not caused by a disease and will improve without treatment. It can often be observed and treated at home. Your health care provider will do a physical exam and possibly order blood tests and X-rays to help determine the seriousness of your pain. However, in many cases, more time must pass before a clear cause of the pain can be found. Before that point, your health care provider may not know if you need more testing or further treatment. HOME CARE INSTRUCTIONS Monitor your abdominal pain for any changes. The following actions may help to alleviate any discomfort you are experiencing:  Only take over-the-counter or prescription medicines as directed by your health care provider.  Do not take laxatives unless directed to do so by your health care provider.  Try a clear liquid diet (broth, tea, or water) as directed by your health care provider. Slowly move to a bland diet as tolerated. SEEK MEDICAL CARE IF:  You have unexplained abdominal pain.  You have abdominal pain associated with nausea or diarrhea.  You have pain when you urinate or have a bowel movement.  You experience abdominal pain that wakes you in the night.  You have abdominal pain that is worsened or improved by eating food.  You have abdominal pain that is worsened with eating fatty foods.  You have a fever. SEEK IMMEDIATE MEDICAL CARE IF:  Your pain does not go away within 2 hours.  You keep throwing up (vomiting).  Your pain is felt only in portions of the abdomen, such  as the right side or the left lower portion of the abdomen.  You pass bloody or black tarry stools. MAKE SURE YOU:  Understand these instructions.  Will watch your condition.  Will get help right away if you are not doing well or get worse.   This information is not intended to replace advice given to you by your health care provider. Make sure you discuss any questions you have with your health care provider.   Document Released: 10/13/2004 Document Revised: 09/24/2014 Document Reviewed: 09/12/2012 Elsevier Interactive Patient Education 2016 Elsevier Inc.  Gastroparesis Gastroparesis, also called delayed gastric emptying, is a condition in which food takes longer than normal to empty from the stomach. The condition is usually long-lasting (chronic). CAUSES This condition may be caused by:  An endocrine disorder, such as hypothyroidism or diabetes. Diabetes is the most common cause of this condition.  A nervous system disease, such as Parkinson disease or multiple sclerosis.  Cancer, infection, or surgery of the stomach or vagus nerve.  A connective tissue disorder, such as scleroderma.  Certain medicines. In most cases, the cause is not known. RISK FACTORS This condition is more likely to develop in:  People with certain disorders, including endocrine disorders, eating disorders, amyloidosis, and scleroderma.  People with certain diseases, including Parkinson disease or multiple sclerosis.  People with cancer or infection of the stomach or vagus nerve.  People who have had surgery on the stomach or vagus nerve.  People who take certain medicines.  Women. SYMPTOMS Symptoms of this condition include:  An early  feeling of fullness when eating.  Nausea.  Weight loss.  Vomiting.  Heartburn.  Abdominal bloating.  Inconsistent blood glucose levels.  Lack of appetite.  Acid from the stomach coming up into the esophagus (gastroesophageal reflux).  Spasms of  the stomach. Symptoms may come and go. DIAGNOSIS This condition is diagnosed with tests, such as:  Tests that check how long it takes food to move through the stomach and intestines. These tests include:  Upper gastrointestinal (GI) series. In this test, X-rays of the intestines are taken after you drink a liquid. The liquid makes the intestines show up better on the X-rays.  Gastric emptying scintigraphy. In this test, scans are taken after you eat food that contains a small amount of radioactive material.  Wireless capsule GI monitoring system. This test involves swallowing a capsule that records information about movement through the stomach.  Gastric manometry. This test measures electrical and muscular activity in the stomach. It is done with a thin tube that is passed down the throat and into the stomach.  Endoscopy. This test checks for abnormalities in the lining of the stomach. It is done with a long, thin tube that is passed down the throat and into the stomach.  An ultrasound. This test can help rule out gallbladder disease or pancreatitis as a cause of your symptoms. It uses sound waves to take pictures of the inside of your body. TREATMENT There is no cure for gastroparesis. This condition may be managed with:  Treatment of the underlying condition causing the gastroparesis.  Lifestyle changes, including exercise and dietary changes. Dietary changes can include:  Changes in what and when you eat.  Eating smaller meals more often.  Eating low-fat foods.  Eating low-fiber forms of high-fiber foods, such as cooked vegetables instead of raw vegetables.  Having liquid foods in place of solid foods. Liquid foods are easier to digest.  Medicines. These may be given to control nausea and vomiting and to stimulate stomach muscles.  Getting food through a feeding tube. This may be done in severe cases.  A gastric neurostimulator. This is a device that is inserted into the  body with surgery. It helps improve stomach emptying and control nausea and vomiting. HOME CARE INSTRUCTIONS  Follow your health care provider's instructions about exercise and diet.  Take medicines only as directed by your health care provider. SEEK MEDICAL CARE IF:  Your symptoms do not improve with treatment.  You have new symptoms. SEEK IMMEDIATE MEDICAL CARE IF:  You have severe abdominal pain that does not improve with treatment.  You have nausea that does not go away.  You cannot keep fluids down.   This information is not intended to replace advice given to you by your health care provider. Make sure you discuss any questions you have with your health care provider.   Document Released: 01/03/2005 Document Revised: 05/20/2014 Document Reviewed: 12/30/2013 Elsevier Interactive Patient Education [employer]

## 2014-11-07 NOTE — ED Notes (Signed)
MD at bedside. 

## 2014-11-07 NOTE — ED Provider Notes (Addendum)
[facility] Emergency Department Provider Note  Time seen: 12:21 AM  I have reviewed the triage vital signs and the nursing notes.   HISTORY  Chief Complaint Back Pain and Abdominal Pain    HPI Brandy Robinson is a 49 y.o. female with a past medical history of depression, anxiety, bipolar, presents the emergency department with upper abdominal pain. According to the patient for the past 2 weeks she has had dull aching upper abdominal pain radiating to her back. States the pain is worse when she eats food. States nausea and vomiting intermittently for the same time as well. States the pain is intermittent, comes and goes, but it has been fairly constant for the past 2 days. Denies any fever, diarrhea or constipation. Denies dysuria. Denies black or bloody stool. Patient states she has had similar pains in the past, but has never found a cause for the pains. After pain currently as an 8/10.     Past Medical History  Diagnosis Date  . Wears glasses   . Depression   . Anxiety   . GERD (gastroesophageal reflux disease)   . Vaginal atrophy   . Lichen   . Surgical menopause   . Endometriosis   . Dyspareunia   . Insomnia   . Bipolar disorder [facility])     Patient Active Problem List   Diagnosis Date Noted  . Lichen sclerosus 09/24/2014  . Surgical menopause 09/24/2014  . Endometriosis 09/24/2014  . Bipolar 1 disorder (HCC) 09/24/2014  . Insomnia 09/24/2014  . GERD (gastroesophageal reflux disease) 09/24/2014  . History of stomach ulcers 09/24/2014    Past Surgical History  Procedure Laterality Date  . Abdominal hysterectomy  1995    tah-bso  . Arm wound repair / closure  2013    cellulitis rt arm-i/d  . Knee bursectomy Left 09/24/2013    Procedure: IRRIGATION AND DEBRIDEMENT OF LEFT KNEE ;  Surgeon: Sheral Apley, MD;  Location: [facility];  Service: Orthopedics;  Laterality: Left;    Current Outpatient Rx  Name  Route  Sig   Dispense  Refill  . buPROPion (WELLBUTRIN XL) 150 MG 24 hr tablet   Oral   Take 150 mg by mouth daily.         . busPIRone (BUSPAR) 10 MG tablet   Oral   Take 10 mg by mouth 3 (three) times daily.         . citalopram (CELEXA) 20 MG tablet   Oral   Take 20 mg by mouth daily.         . clonazePAM (KLONOPIN) 1 MG tablet   Oral   Take 1 mg by mouth at bedtime.         . conjugated estrogens (PREMARIN) vaginal cream   Vaginal   Place 1 Applicatorful vaginally 3 (three) times a week. 1/2 gram three times weekly   42.5 g   4   . estradiol (ESTRACE) 1 MG tablet   Oral   Take 1 tablet (1 mg total) by mouth daily.   30 tablet   12   . mometasone (ELOCON) 0.1 % ointment   Topical   Apply topically 2 (two) times a week.   45 g   3   . omeprazole (PRILOSEC) 20 MG capsule   Oral   Take 20 mg by mouth 2 (two) times daily before a meal.         . QUEtiapine (SEROQUEL) 300 MG tablet  Oral   Take 300 mg by mouth at bedtime.         Marland Kitchen. QUEtiapine (SEROQUEL) 50 MG tablet   Oral   Take 50 mg by mouth 2 (two) times daily. Rapid release           Allergies Hydrocodone; Aspirin; and Morphine and related  Family History  Problem Relation Age of Onset  . Diabetes Mother   . Diabetes Sister   . Cancer Neg Hx   . Heart disease Neg Hx     Social History Social History  Substance Use Topics  . Smoking status: Current Every Day Smoker -- 1.00 packs/day    Types: Cigarettes  . Smokeless tobacco: None  . Alcohol Use: No    Review of Systems Constitutional: Negative for fever. Cardiovascular: Negative for chest pain. Respiratory: Negative for shortness of breath. Gastrointestinal: Upper abdominal pain, nausea and vomiting. Negative for diarrhea or constipation. Genitourinary: Negative for dysuria. Musculoskeletal: States pain radiates to her back. 10-point ROS otherwise negative.  ____________________________________________   PHYSICAL EXAM:  VITAL  SIGNS: ED Triage Vitals  Enc Vitals Group     BP 11/06/14 2319 127/68 mmHg     Pulse Rate 11/06/14 2319 88     Resp 11/06/14 2319 20     Temp 11/06/14 2319 97.8 F (36.6 C)     Temp Source 11/06/14 2319 Oral     SpO2 11/06/14 2319 98 %     Weight 11/06/14 2319 156 lb (70.761 kg)     Height 11/06/14 2319 5\' 3"  (1.6 m)     Head Cir --      Peak Flow --      Pain Score 11/06/14 2320 9     Pain Loc --      Pain Edu? --      Excl. in GC? --    Constitutional: Alert and oriented. Well appearing and in no distress. Eyes: Normal exam ENT   Head: Normocephalic and atraumatic   Mouth/Throat: Mucous membranes are moist. Cardiovascular: Normal rate, regular rhythm. No murmur Respiratory: Normal respiratory effort without tachypnea nor retractions. Breath sounds are clear  Gastrointestinal: Soft, moderate epigastric and right upper quadrant tenderness palpation. No rebound or guarding. No distention. No CVA tenderness. Also with mild left lower quadrant tenderness to palpation. Musculoskeletal: Nontender with normal range of motion in all extremities.  Neurologic:  Normal speech and language. No gross focal neurologic deficits are appreciated. Speech is normal. Skin:  Skin is warm, dry and intact.  Psychiatric: Mood and affect are normal.  ____________________________________________     RADIOLOGY  Negative for carotid ultrasound  ____________________________________________   INITIAL IMPRESSION / ASSESSMENT AND PLAN / ED COURSE  Pertinent labs & imaging results that were available during my care of the patient were reviewed by me and considered in my medical decision making (see chart for details).  Patient presents for 2 weeks of intermittent upper abdominal pain, worse over the past 2 days. Labs show a mild alkaline phosphatase elevation as well as mild leukocytosis, and possible urinary tract infection. Denies any urinary symptoms. We will hold off on treatment, but I will  send a urine culture. We'll obtain an ultrasound of her right upper quadrant given her epigastric right upper quadrant tenderness to palpation. Patient is agreeable to plan.   Ultrasound largely within normal limits. Patient states she is feeling much better status post pain medication. Ultrasound does show signs of possible gastroparesis which would explain the patient's symptoms ongoing for  2 weeks. Patient states normal bowel movements including this morning, able to pass gas without issue. Do not suspect bowel obstruction. We will dose Reglan. Patient with a slightly lower oxygen saturation around 92% after her second dose of pain medication, likely pain medication related. We'll monitor in the emergency department to allow the pain medication to wear off. Plan to discharge home with Reglan and pain medication, as well as GI follow-up. Patient and husband are agreeable. Discussed strict abdominal pain return precautions.  Replaced pulse ox, and patient is satting 96-97% with a good waveform. We will discharge at this time. ____________________________________________   FINAL CLINICAL IMPRESSION(S) / ED DIAGNOSES  Upper abdominal pain Gastroparesis  Minna Antis, MD 11/07/14 [id number]  Minna Antis, MD 11/07/14 [phone]

## 2014-11-19 LAB — URINE CULTURE

## 2014-12-30 ENCOUNTER — Inpatient Hospital Stay
Admission: EM | Admit: 2014-12-30 | Discharge: 2015-01-12 | DRG: 326 | Disposition: A | Discharge status: Home / Self Care | Payer: No Typology Code available for payment source | Attending: General Surgery | Admitting: General Surgery

## 2014-12-30 ENCOUNTER — Inpatient Hospital Stay: Payer: Self-pay | Admitting: Anesthesiology

## 2014-12-30 ENCOUNTER — Encounter: Payer: Self-pay | Admitting: Emergency Medicine

## 2014-12-30 ENCOUNTER — Encounter
Admission: EM | Disposition: A | Discharge status: Home / Self Care | Payer: Self-pay | Source: Home / Self Care | Attending: General Surgery

## 2014-12-30 ENCOUNTER — Emergency Department: Payer: Self-pay

## 2014-12-30 DIAGNOSIS — J9600 Acute respiratory failure, unspecified whether with hypoxia or hypercapnia: Secondary | ICD-10-CM

## 2014-12-30 DIAGNOSIS — K659 Peritonitis, unspecified: Secondary | ICD-10-CM | POA: Diagnosis not present

## 2014-12-30 DIAGNOSIS — R109 Unspecified abdominal pain: Secondary | ICD-10-CM

## 2014-12-30 DIAGNOSIS — D62 Acute posthemorrhagic anemia: Secondary | ICD-10-CM | POA: Diagnosis not present

## 2014-12-30 DIAGNOSIS — Z833 Family history of diabetes mellitus: Secondary | ICD-10-CM

## 2014-12-30 DIAGNOSIS — K251 Acute gastric ulcer with perforation: Secondary | ICD-10-CM

## 2014-12-30 DIAGNOSIS — N390 Urinary tract infection, site not specified: Secondary | ICD-10-CM | POA: Diagnosis present

## 2014-12-30 DIAGNOSIS — R410 Disorientation, unspecified: Secondary | ICD-10-CM

## 2014-12-30 DIAGNOSIS — N809 Endometriosis, unspecified: Secondary | ICD-10-CM

## 2014-12-30 DIAGNOSIS — R6520 Severe sepsis without septic shock: Secondary | ICD-10-CM | POA: Diagnosis not present

## 2014-12-30 DIAGNOSIS — E870 Hyperosmolality and hypernatremia: Secondary | ICD-10-CM | POA: Diagnosis present

## 2014-12-30 DIAGNOSIS — Z8711 Personal history of peptic ulcer disease: Secondary | ICD-10-CM

## 2014-12-30 DIAGNOSIS — R198 Other specified symptoms and signs involving the digestive system and abdomen: Secondary | ICD-10-CM | POA: Diagnosis present

## 2014-12-30 DIAGNOSIS — G9341 Metabolic encephalopathy: Secondary | ICD-10-CM | POA: Diagnosis not present

## 2014-12-30 DIAGNOSIS — I509 Heart failure, unspecified: Secondary | ICD-10-CM

## 2014-12-30 DIAGNOSIS — Z888 Allergy status to other drugs, medicaments and biological substances status: Secondary | ICD-10-CM

## 2014-12-30 DIAGNOSIS — G4700 Insomnia, unspecified: Secondary | ICD-10-CM

## 2014-12-30 DIAGNOSIS — J9690 Respiratory failure, unspecified, unspecified whether with hypoxia or hypercapnia: Secondary | ICD-10-CM

## 2014-12-30 DIAGNOSIS — L900 Lichen sclerosus et atrophicus: Secondary | ICD-10-CM

## 2014-12-30 DIAGNOSIS — E876 Hypokalemia: Secondary | ICD-10-CM | POA: Diagnosis present

## 2014-12-30 DIAGNOSIS — Z809 Family history of malignant neoplasm, unspecified: Secondary | ICD-10-CM

## 2014-12-30 DIAGNOSIS — F17200 Nicotine dependence, unspecified, uncomplicated: Secondary | ICD-10-CM | POA: Diagnosis present

## 2014-12-30 DIAGNOSIS — F17210 Nicotine dependence, cigarettes, uncomplicated: Secondary | ICD-10-CM | POA: Diagnosis present

## 2014-12-30 DIAGNOSIS — J9811 Atelectasis: Secondary | ICD-10-CM | POA: Diagnosis not present

## 2014-12-30 DIAGNOSIS — R140 Abdominal distension (gaseous): Secondary | ICD-10-CM

## 2014-12-30 DIAGNOSIS — R0902 Hypoxemia: Secondary | ICD-10-CM

## 2014-12-30 DIAGNOSIS — J9601 Acute respiratory failure with hypoxia: Secondary | ICD-10-CM | POA: Diagnosis not present

## 2014-12-30 DIAGNOSIS — Z90710 Acquired absence of both cervix and uterus: Secondary | ICD-10-CM

## 2014-12-30 DIAGNOSIS — F419 Anxiety disorder, unspecified: Secondary | ICD-10-CM | POA: Diagnosis present

## 2014-12-30 DIAGNOSIS — K7290 Hepatic failure, unspecified without coma: Secondary | ICD-10-CM | POA: Diagnosis not present

## 2014-12-30 DIAGNOSIS — Z4659 Encounter for fitting and adjustment of other gastrointestinal appliance and device: Secondary | ICD-10-CM

## 2014-12-30 DIAGNOSIS — K255 Chronic or unspecified gastric ulcer with perforation: Principal | ICD-10-CM | POA: Diagnosis present

## 2014-12-30 DIAGNOSIS — K219 Gastro-esophageal reflux disease without esophagitis: Secondary | ICD-10-CM | POA: Diagnosis present

## 2014-12-30 DIAGNOSIS — F319 Bipolar disorder, unspecified: Secondary | ICD-10-CM | POA: Diagnosis present

## 2014-12-30 DIAGNOSIS — E8940 Asymptomatic postprocedural ovarian failure: Secondary | ICD-10-CM

## 2014-12-30 DIAGNOSIS — J811 Chronic pulmonary edema: Secondary | ICD-10-CM | POA: Diagnosis present

## 2014-12-30 DIAGNOSIS — K660 Peritoneal adhesions (postprocedural) (postinfection): Secondary | ICD-10-CM | POA: Diagnosis present

## 2014-12-30 DIAGNOSIS — Z8719 Personal history of other diseases of the digestive system: Secondary | ICD-10-CM

## 2014-12-30 DIAGNOSIS — A419 Sepsis, unspecified organism: Secondary | ICD-10-CM | POA: Diagnosis not present

## 2014-12-30 HISTORY — PX: LAPAROTOMY: SHX154

## 2014-12-30 LAB — CBC WITH DIFFERENTIAL/PLATELET
Basophils Absolute: 0 10*3/uL (ref 0–0.1)
Basophils Relative: 0 %
Eosinophils Absolute: 0 10*3/uL (ref 0–0.7)
Eosinophils Relative: 0 %
HEMATOCRIT: 30.9 % — AB (ref 35.0–47.0)
HEMOGLOBIN: 10 g/dL — AB (ref 12.0–16.0)
LYMPHS ABS: 1 10*3/uL (ref 1.0–3.6)
Lymphocytes Relative: 7 %
MCH: 29.3 pg (ref 26.0–34.0)
MCHC: 32.5 g/dL (ref 32.0–36.0)
MCV: 90.2 fL (ref 80.0–100.0)
MONO ABS: 0.8 10*3/uL (ref 0.2–0.9)
MONOS PCT: 6 %
NEUTROS ABS: 11.6 10*3/uL — AB (ref 1.4–6.5)
NEUTROS PCT: 87 %
Platelets: 393 10*3/uL (ref 150–440)
RBC: 3.42 MIL/uL — ABNORMAL LOW (ref 3.80–5.20)
RDW: 14.8 % — AB (ref 11.5–14.5)
WBC: 13.5 10*3/uL — ABNORMAL HIGH (ref 3.6–11.0)

## 2014-12-30 LAB — URINALYSIS COMPLETE WITH MICROSCOPIC (ARMC ONLY)
BACTERIA UA: NONE SEEN
Bilirubin Urine: NEGATIVE
Glucose, UA: NEGATIVE mg/dL
HGB URINE DIPSTICK: NEGATIVE
Ketones, ur: NEGATIVE mg/dL
Nitrite: NEGATIVE
PH: 5 (ref 5.0–8.0)
Protein, ur: NEGATIVE mg/dL
Specific Gravity, Urine: >1.06 — ABNORMAL HIGH (ref 1.005–1.030)

## 2014-12-30 LAB — COMPREHENSIVE METABOLIC PANEL
ALT: 10 U/L — AB (ref 14–54)
AST: 12 U/L — AB (ref 15–41)
Albumin: 2.6 g/dL — ABNORMAL LOW (ref 3.5–5.0)
Alkaline Phosphatase: 131 U/L — ABNORMAL HIGH (ref 38–126)
Anion gap: 15 (ref 5–15)
BILIRUBIN TOTAL: 0.3 mg/dL (ref 0.3–1.2)
BUN: 24 mg/dL — AB (ref 6–20)
CHLORIDE: 97 mmol/L — AB (ref 101–111)
CO2: 20 mmol/L — ABNORMAL LOW (ref 22–32)
CREATININE: 1.07 mg/dL — AB (ref 0.44–1.00)
Calcium: 8.7 mg/dL — ABNORMAL LOW (ref 8.9–10.3)
GFR calc Af Amer: >60 mL/min (ref >60)
Glucose, Bld: 99 mg/dL (ref 65–99)
POTASSIUM: 4 mmol/L (ref 3.5–5.1)
Sodium: 132 mmol/L — ABNORMAL LOW (ref 135–145)
TOTAL PROTEIN: 6.3 g/dL — AB (ref 6.5–8.1)

## 2014-12-30 LAB — POCT PREGNANCY, URINE: Preg Test, Ur: NEGATIVE

## 2014-12-30 LAB — LIPASE, BLOOD: Lipase: 16 U/L (ref 11–51)

## 2014-12-30 LAB — ACETAMINOPHEN LEVEL

## 2014-12-30 LAB — TYPE AND SCREEN
ABO/RH(D): A NEG
Antibody Screen: NEGATIVE

## 2014-12-30 LAB — LACTIC ACID, PLASMA: Lactic Acid, Venous: 2.6 mmol/L (ref 0.5–2.0)

## 2014-12-30 LAB — SALICYLATE LEVEL: SALICYLATE LVL: 6.4 mg/dL (ref 2.8–30.0)

## 2014-12-30 LAB — TROPONIN I: Troponin I: <0.03 ng/mL (ref <0.031)

## 2014-12-30 SURGERY — LAPAROTOMY, EXPLORATORY
Anesthesia: General | Site: Abdomen | Wound class: Dirty or Infected

## 2014-12-30 MED ORDER — SODIUM CHLORIDE 0.9 % IV BOLUS (SEPSIS)
1000.0000 mL | Freq: Once | INTRAVENOUS | Status: AC
  Administered 2014-12-30: 1000 mL via INTRAVENOUS

## 2014-12-30 MED ORDER — LACTATED RINGERS IV SOLN
INTRAVENOUS | Status: DC | PRN
  Administered 2014-12-30: 22:00:00 via INTRAVENOUS

## 2014-12-30 MED ORDER — PIPERACILLIN-TAZOBACTAM 3.375 G IVPB 30 MIN
3.3750 g | Freq: Once | INTRAVENOUS | Status: AC
  Administered 2014-12-30: 3.375 g via INTRAVENOUS
  Filled 2014-12-30: qty 50

## 2014-12-30 MED ORDER — FENTANYL CITRATE (PF) 100 MCG/2ML IJ SOLN
25.0000 ug | Freq: Once | INTRAMUSCULAR | Status: AC
  Administered 2014-12-30: 25 ug via INTRAVENOUS
  Filled 2014-12-30: qty 2

## 2014-12-30 MED ORDER — PHENYLEPHRINE HCL 10 MG/ML IJ SOLN
INTRAMUSCULAR | Status: DC | PRN
  Administered 2014-12-30 (×4): 100 ug via INTRAVENOUS

## 2014-12-30 MED ORDER — LIDOCAINE HCL (PF) 1 % IJ SOLN
INTRAMUSCULAR | Status: AC
  Filled 2014-12-30: qty 30

## 2014-12-30 MED ORDER — BUPIVACAINE HCL (PF) 0.5 % IJ SOLN
INTRAMUSCULAR | Status: AC
  Filled 2014-12-30: qty 30

## 2014-12-30 MED ORDER — HYDROMORPHONE HCL 1 MG/ML IJ SOLN
INTRAMUSCULAR | Status: AC
  Filled 2014-12-30: qty 1

## 2014-12-30 MED ORDER — PROPOFOL 10 MG/ML IV BOLUS
INTRAVENOUS | Status: DC | PRN
  Administered 2014-12-30: 100 mg via INTRAVENOUS

## 2014-12-30 MED ORDER — FENTANYL CITRATE (PF) 100 MCG/2ML IJ SOLN
25.0000 ug | INTRAMUSCULAR | Status: AC | PRN
  Administered 2014-12-30 (×6): 25 ug via INTRAVENOUS

## 2014-12-30 MED ORDER — MIDAZOLAM HCL 2 MG/2ML IJ SOLN
INTRAMUSCULAR | Status: DC | PRN
  Administered 2014-12-30: 2 mg via INTRAVENOUS

## 2014-12-30 MED ORDER — IOHEXOL 350 MG/ML SOLN
125.0000 mL | Freq: Once | INTRAVENOUS | Status: AC | PRN
Start: 2014-12-30 — End: 2014-12-30
  Administered 2014-12-30: 125 mL via INTRAVENOUS

## 2014-12-30 MED ORDER — LIDOCAINE HCL 1 % IJ SOLN
INTRAMUSCULAR | Status: DC | PRN
  Administered 2014-12-30: 30 mL via INTRADERMAL

## 2014-12-30 MED ORDER — FENTANYL CITRATE (PF) 100 MCG/2ML IJ SOLN
INTRAMUSCULAR | Status: DC | PRN
  Administered 2014-12-30: 100 ug via INTRAVENOUS
  Administered 2014-12-30 (×2): 50 ug via INTRAVENOUS
  Administered 2014-12-30: 100 ug via INTRAVENOUS
  Administered 2014-12-30: 50 ug via INTRAVENOUS

## 2014-12-30 MED ORDER — ONDANSETRON HCL 4 MG/2ML IJ SOLN
INTRAMUSCULAR | Status: DC | PRN
Start: 2014-12-30 — End: 2014-12-30
  Administered 2014-12-30: 4 mg via INTRAVENOUS

## 2014-12-30 MED ORDER — LIDOCAINE HCL (CARDIAC) 20 MG/ML IV SOLN
INTRAVENOUS | Status: DC | PRN
  Administered 2014-12-30: 50 mg via INTRAVENOUS

## 2014-12-30 MED ORDER — PIPERACILLIN-TAZOBACTAM 3.375 G IVPB
3.3750 g | Freq: Three times a day (TID) | INTRAVENOUS | Status: DC
  Administered 2014-12-31 – 2015-01-09 (×29): 3.375 g via INTRAVENOUS
  Filled 2014-12-30 (×33): qty 50

## 2014-12-30 MED ORDER — SODIUM CHLORIDE 0.9 % IV SOLN
10000.0000 ug | INTRAVENOUS | Status: DC | PRN
  Administered 2014-12-30: 20 ug/min via INTRAVENOUS

## 2014-12-30 MED ORDER — FENTANYL CITRATE (PF) 100 MCG/2ML IJ SOLN
INTRAMUSCULAR | Status: AC
  Filled 2014-12-30: qty 2

## 2014-12-30 MED ORDER — DEXAMETHASONE SODIUM PHOSPHATE 10 MG/ML IJ SOLN
INTRAMUSCULAR | Status: DC | PRN
  Administered 2014-12-30: 4 mg via INTRAVENOUS

## 2014-12-30 MED ORDER — SUGAMMADEX SODIUM 200 MG/2ML IV SOLN
INTRAVENOUS | Status: DC | PRN
  Administered 2014-12-30: 144.6 mg via INTRAVENOUS

## 2014-12-30 MED ORDER — ROCURONIUM BROMIDE 100 MG/10ML IV SOLN
INTRAVENOUS | Status: DC | PRN
  Administered 2014-12-30: 30 mg via INTRAVENOUS

## 2014-12-30 MED ORDER — SUCCINYLCHOLINE CHLORIDE 20 MG/ML IJ SOLN
INTRAMUSCULAR | Status: DC | PRN
  Administered 2014-12-30: 100 mg via INTRAVENOUS

## 2014-12-30 MED ORDER — METOCLOPRAMIDE HCL 5 MG/ML IJ SOLN
10.0000 mg | Freq: Once | INTRAMUSCULAR | Status: AC
Start: 2014-12-30 — End: 2014-12-30
  Administered 2014-12-30: 10 mg via INTRAVENOUS
  Filled 2014-12-30: qty 2

## 2014-12-30 MED ORDER — HYDROMORPHONE HCL 1 MG/ML IJ SOLN
0.2500 mg | INTRAMUSCULAR | Status: DC | PRN
  Administered 2014-12-30 – 2014-12-31 (×2): 0.25 mg via INTRAVENOUS

## 2014-12-30 MED ORDER — LACTATED RINGERS IV SOLN
INTRAVENOUS | Status: DC | PRN
  Administered 2014-12-30 (×2): via INTRAVENOUS

## 2014-12-30 MED ORDER — ONDANSETRON HCL 4 MG/2ML IJ SOLN: 4.0000 mg | Freq: Once | INTRAMUSCULAR | Status: DC | PRN

## 2014-12-30 SURGICAL SUPPLY — 31 items
BULB RESERV EVAC DRAIN JP 100C (MISCELLANEOUS) ×8 IMPLANT
CANISTER SUCT 1200ML W/VALVE (MISCELLANEOUS) ×4 IMPLANT
CATH TRAY 16F METER LATEX (MISCELLANEOUS) ×4 IMPLANT
CHLORAPREP W/TINT 26ML (MISCELLANEOUS) ×4 IMPLANT
DRAIN CHANNEL JP 19F (MISCELLANEOUS) ×12 IMPLANT
DRAPE LAPAROTOMY 100X77 ABD (DRAPES) ×4 IMPLANT
DRSG OPSITE POSTOP 4X14 (GAUZE/BANDAGES/DRESSINGS) ×4 IMPLANT
ELECT CAUTERY BLADE 6.4 (BLADE) ×4 IMPLANT
GAUZE SPONGE 4X4 12PLY STRL (GAUZE/BANDAGES/DRESSINGS) ×4 IMPLANT
GLOVE BIO SURGEON STRL SZ7.5 (GLOVE) ×12 IMPLANT
GLOVE INDICATOR 8.0 STRL GRN (GLOVE) ×8 IMPLANT
GOWN STRL REUS W/ TWL LRG LVL3 (GOWN DISPOSABLE) ×4 IMPLANT
GOWN STRL REUS W/TWL LRG LVL3 (GOWN DISPOSABLE) ×4
KIT RM TURNOVER STRD PROC AR (KITS) ×4 IMPLANT
LABEL OR SOLS (LABEL) ×4 IMPLANT
NS IRRIG 1000ML POUR BTL (IV SOLUTION) ×4 IMPLANT
PACK BASIN MAJOR ARMC (MISCELLANEOUS) ×4 IMPLANT
PAD GROUND ADULT SPLIT (MISCELLANEOUS) ×4 IMPLANT
SHEARS HARMONIC STRL 23CM (MISCELLANEOUS) IMPLANT
SPONGE DRAIN TRACH 4X4 STRL 2S (GAUZE/BANDAGES/DRESSINGS) ×12 IMPLANT
STAPLER SKIN PROX 35W (STAPLE) ×4 IMPLANT
SUT ETHILON 3-0 (SUTURE) ×4 IMPLANT
SUT PDS AB 1 TP1 96 (SUTURE) ×8 IMPLANT
SUT SILK 2 0 SH (SUTURE) ×8 IMPLANT
SUT SILK 3 0 (SUTURE)
SUT SILK 3-0 (SUTURE)
SUT SILK 3-0 18XBRD TIE 12 (SUTURE) IMPLANT
SUT SILK 3-0 SH-1 18XCR BRD (SUTURE)
SUT VIC AB 3-0 SH 27 (SUTURE)
SUT VIC AB 3-0 SH 27X BRD (SUTURE) IMPLANT
SUTURE SILK 3-0 SH-1 18XCR BRD (SUTURE) IMPLANT

## 2014-12-30 NOTE — Anesthesia Procedure Notes (Addendum)
Date/Time: 12/30/2014 9:12 PM Performed by: Waldo LaineJUSTIS, Sharissa Brierley Pre-anesthesia Checklist: Patient identified, Emergency Drugs available, Suction available, Patient being monitored and Timeout performed Patient Re-evaluated:Patient Re-evaluated prior to inductionOxygen Delivery Method: Circle system utilized Preoxygenation: Pre-oxygenation with 100% oxygen Intubation Type: IV induction and Cricoid Pressure applied Laryngoscope Size: Miller and 2 Grade View: Grade I Tube type: Oral Number of attempts: 1 Airway Equipment and Method: Stylet Secured at: 21 cm Tube secured with: Tape Dental Injury: Teeth and Oropharynx as per pre-operative assessment    Performed by: Waldo LaineJUSTIS, Harlea Goetzinger

## 2014-12-30 NOTE — Progress Notes (Signed)
ANTIBIOTIC CONSULT NOTE - INITIAL  Pharmacy Consult for Zosyn dosing Indication: intra-abdominal infection  Allergies  Allergen Reactions  . Hydrocodone Itching  . Aspirin Other (See Comments)    Reaction:  GI upset   . Morphine And Related Itching, Nausea And Vomiting and Other (See Comments)    Reaction:  Chest pain    Patient Measurements: Height: 5\' 3"  (160 cm) Weight: 159 lb 6.3 oz (72.3 kg) IBW/kg (Calculated) : 52.4 Adjusted Body Weight: n/a  Vital Signs: Temp: 98.3 F (36.8 C) (12/13 1441) BP: 109/56 mmHg (12/13 1930) Pulse Rate: 139 (12/13 1930) Intake/Output from previous day:   Intake/Output from this shift: Total I/O In: 2300 [I.V.:2300] Out: -   Labs:  Recent Labs  12/30/14 1444  WBC 13.5*  HGB 10.0*  PLT 393  CREATININE 1.07*   Estimated Creatinine Clearance: 62 mL/min (by C-G formula based on Cr of 1.07). No results for input(Robinson): VANCOTROUGH, VANCOPEAK, VANCORANDOM, GENTTROUGH, GENTPEAK, GENTRANDOM, TOBRATROUGH, TOBRAPEAK, TOBRARND, AMIKACINPEAK, AMIKACINTROU, AMIKACIN in the last 72 hours.   Microbiology: No results found for this or any previous visit (from the past 720 hour(Robinson)).  Medical History: Past Medical History  Diagnosis Date  . Wears glasses   . Depression   . Anxiety   . GERD (gastroesophageal reflux disease)   . Vaginal atrophy   . Lichen   . Surgical menopause   . Endometriosis   . Dyspareunia   . Insomnia   . Bipolar disorder (HCC)     Medications:   Assessment: Blood and urine cx pending UA: LE(+) NO2(-) WBC 6-30 CXR: no acute disease  Goal of Therapy:  Resolve infection  Plan:  Zosyn 3.375 grams q 8 hours ordered.  Brandy Robinson 12/30/2014,11:11 PM

## 2014-12-30 NOTE — ED Notes (Signed)
MD at bedside to eval for admission.  Pt banded for t&s and lactic acid sent to lab

## 2014-12-30 NOTE — ED Provider Notes (Signed)
[facility] Emergency Department Provider Note  ____________________________________________  Time seen: Approximately 3:05 PM  I have reviewed the triage vital signs and the nursing notes.   HISTORY  Chief Complaint Abdominal Pain    HPI Brandy Robinson is a 49 y.o. female with a history of endometriosis and bipolar disorder who is presenting today with epigastric and left upper quadrant abdominal pain. She says the pain started this past Monday and is sharp and constant. She says that she has not been able to eat however is requesting water at this time. She says that she has had yellow vomitus and has had 4 episodes today and similar number of episodes in previous days.  Denies any sick contacts. Denies any chest pain or shortness of breath. Denies any history of gastroparesis even know the last time she was in the emergency department she had a diagnosis as a discharge diagnosis. Denies history of diabetes. Denies any vaginal bleeding or discharge. Says she has a history of a hysterectomy. Denies any burning with urination. Denies any radiation of the pain.Denies any history of kidney stones.   Past Medical History  Diagnosis Date  . Wears glasses   . Depression   . Anxiety   . GERD (gastroesophageal reflux disease)   . Vaginal atrophy   . Lichen   . Surgical menopause   . Endometriosis   . Dyspareunia   . Insomnia   . Bipolar disorder [facility](HCC)     Patient Active Problem List   Diagnosis Date Noted  . Lichen sclerosus 09/24/2014  . Surgical menopause 09/24/2014  . Endometriosis 09/24/2014  . Bipolar 1 disorder (HCC) 09/24/2014  . Insomnia 09/24/2014  . GERD (gastroesophageal reflux disease) 09/24/2014  . History of stomach ulcers 09/24/2014    Past Surgical History  Procedure Laterality Date  . Abdominal hysterectomy  1995    tah-bso  . Arm wound repair / closure  2013    cellulitis rt arm-i/d  . Knee bursectomy Left 09/24/2013    Procedure:  IRRIGATION AND DEBRIDEMENT OF LEFT KNEE ;  Surgeon: Sheral Apleyimothy D Murphy, MD;  Location: [facility];  Service: Orthopedics;  Laterality: Left;    Current Outpatient Rx  Name  Route  Sig  Dispense  Refill  . buPROPion (WELLBUTRIN XL) 150 MG 24 hr tablet   Oral   Take 150 mg by mouth daily.         . busPIRone (BUSPAR) 10 MG tablet   Oral   Take 20 mg by mouth 3 (three) times daily.          . citalopram (CELEXA) 20 MG tablet   Oral   Take 40 mg by mouth daily.          . clonazePAM (KLONOPIN) 1 MG tablet   Oral   Take 1 mg by mouth at bedtime.         . conjugated estrogens (PREMARIN) vaginal cream   Vaginal   Place 1 Applicatorful vaginally 3 (three) times a week. 1/2 gram three times weekly   42.5 g   4   . estradiol (ESTRACE) 1 MG tablet   Oral   Take 1 tablet (1 mg total) by mouth daily.   30 tablet   12   . metoCLOPramide (REGLAN) 10 MG tablet   Oral   Take 1 tablet (10 mg total) by mouth 3 (three) times daily with meals.   90 tablet   1   . mometasone (ELOCON) 0.1 %  ointment   Topical   Apply topically 2 (two) times a week. Patient taking differently: Apply 1 application topically 2 (two) times a week.    45 g   3   . omeprazole (PRILOSEC) 20 MG capsule   Oral   Take 20 mg by mouth 2 (two) times daily as needed (acid reflux).          Marland Kitchen oxyCODONE-acetaminophen (ROXICET) 5-325 MG tablet   Oral   Take 1 tablet by mouth every 6 (six) hours as needed.   10 tablet   0   . QUEtiapine (SEROQUEL XR) 300 MG 24 hr tablet   Oral   Take 300 mg by mouth at bedtime.         Marland Kitchen QUEtiapine (SEROQUEL) 50 MG tablet   Oral   Take 50 mg by mouth 2 (two) times daily. Rapid release           Allergies Hydrocodone; Aspirin; and Morphine and related  Family History  Problem Relation Age of Onset  . Diabetes Mother   . Diabetes Sister   . Cancer Neg Hx   . Heart disease Neg Hx     Social History Social History  Substance Use Topics   . Smoking status: Current Every Day Smoker -- 1.00 packs/day    Types: Cigarettes  . Smokeless tobacco: None  . Alcohol Use: No    Review of Systems Constitutional: No fever/chills Eyes: No visual changes. ENT: No sore throat. Cardiovascular: Denies chest pain. Respiratory: Denies shortness of breath. Gastrointestinal:  No diarrhea.  No constipation. Genitourinary: Negative for dysuria. Musculoskeletal: Negative for back pain. Skin: Negative for rash. Neurological: Negative for headaches, focal weakness or numbness.  10-point ROS otherwise negative.  ____________________________________________   PHYSICAL EXAM:  VITAL SIGNS: ED Triage Vitals  Enc Vitals Group     BP 12/30/14 1441 118/102 mmHg     Pulse Rate 12/30/14 1441 138     Resp 12/30/14 1441 20     Temp 12/30/14 1441 98.3 F (36.8 C)     Temp src --      SpO2 12/30/14 1441 94 %     Weight 12/30/14 1441 159 lb 6.3 oz (72.3 kg)     Height 12/30/14 1441 5\' 3"  (1.6 m)     Head Cir --      Peak Flow --      Pain Score 12/30/14 1447 10     Pain Loc --      Pain Edu? --      Excl. in GC? --     Constitutional: Alert and oriented. Well appearing and in no acute distress. Eyes: Conjunctivae are normal. PERRL. EOMI. Head: Atraumatic. Nose: No congestion/rhinnorhea. Mouth/Throat: Mucous membranes are moist.   Neck: No stridor.   Cardiovascular: Tachycardic, regular rhythm. Grossly normal heart sounds.  Good peripheral circulation. Respiratory: Normal respiratory effort.  No retractions. Lungs CTAB. Gastrointestinal: Soft with diffuse abdominal tenderness which is worse than the left upper quadrant. There is no rebound or guarding. No distention. No abdominal bruits. No CVA tenderness. Musculoskeletal: No lower extremity tenderness nor edema.  No joint effusions. Neurologic:  Normal speech and language. No gross focal neurologic deficits are appreciated. No gait instability. Skin:  Skin is warm, dry and intact. No  rash noted. Psychiatric: Mood and affect are normal. Speech and behavior are normal.  ____________________________________________   LABS (all labs ordered are listed, but only abnormal results are displayed)  Labs Reviewed  COMPREHENSIVE METABOLIC PANEL -  Abnormal; Notable for the following:    Sodium 132 (*)    Chloride 97 (*)    CO2 20 (*)    BUN 24 (*)    Creatinine, Ser 1.07 (*)    Calcium 8.7 (*)    Total Protein 6.3 (*)    Albumin 2.6 (*)    AST 12 (*)    ALT 10 (*)    Alkaline Phosphatase 131 (*)    All other components within normal limits  CBC WITH DIFFERENTIAL/PLATELET - Abnormal; Notable for the following:    WBC 13.5 (*)    RBC 3.42 (*)    Hemoglobin 10.0 (*)    HCT 30.9 (*)    RDW 14.8 (*)    Neutro Abs 11.6 (*)    All other components within normal limits  ACETAMINOPHEN LEVEL - Abnormal; Notable for the following:    Acetaminophen (Tylenol), Serum <10 (*)    All other components within normal limits  CULTURE, BLOOD (ROUTINE X 2)  CULTURE, BLOOD (ROUTINE X 2)  URINE CULTURE  LIPASE, BLOOD  TROPONIN I  SALICYLATE LEVEL  URINALYSIS COMPLETEWITH MICROSCOPIC ([facility] ONLY)  LACTIC ACID, PLASMA  LACTIC ACID, PLASMA  POC URINE PREG, ED  TYPE AND SCREEN   ____________________________________________  EKG  ED ECG REPORT I, Schaevitz,  Teena Irani, the attending physician, personally viewed and interpreted this ECG.   Date: 12/30/2014  EKG Time: 1445  Rate: 137  Rhythm: sinus tachycardia  Axis: Normal axis  Intervals:Borderline prolonged QT interval.  ST&T Change: No obvious ST segment elevation or depression. No obvious T-wave inversions. However, there is a poor baseline which makes the EKG difficult to interpret.  ____________________________________________  RADIOLOGY  IMPRESSION: 1. Free intraperitoneal air and fluid in the upper abdomen consistent with perforated viscus. Apparent mucosal defect in the gastric antrum and associated gas collection  suggest perforated gastric ulcer. Critical Value/emergent results were called by telephone at the time of interpretation on 12/30/2014 at 5:09 pm to Dr. Gladstone Pih , who verbally acknowledged these results. 2. Negative for acute PE or thoracic aortic dissection. 3. Linear scarring or atelectasis in the lung bases right greater than left. 4. Age-advanced aortoiliac atheromatous plaque. ____________________________________________   PROCEDURES  CRITICAL CARE Performed by: Arelia Longest   Total critical care time: 35 minutes  Critical care time was exclusive of separately billable procedures and treating other patients.  Critical care was necessary to treat or prevent imminent or life-threatening deterioration.  Critical care was time spent personally by me on the following activities: development of treatment plan with patient and/or surrogate as well as nursing, discussions with consultants, evaluation of patient's response to treatment, examination of patient, obtaining history from patient or surrogate, ordering and performing treatments and interventions, ordering and review of laboratory studies, ordering and review of radiographic studies, pulse oximetry and re-evaluation of patient's condition.   ____________________________________________   INITIAL IMPRESSION / ASSESSMENT AND PLAN / ED COURSE  Pertinent labs & imaging results that were available during my care of the patient were reviewed by me and considered in my medical decision making (see chart for details).  ----------------------------------------- 5:15 PM on 12/30/2014 -----------------------------------------  Discussed the case with Dr. Doristine Counter of the surgical service will dilate the patient. Also made patient a sepsis alert after hearing the diagnosis. Patient updated and advised that she will likely need an urgent surgery for this condition. ____________________________________________   FINAL  CLINICAL IMPRESSION(S) / ED DIAGNOSES  Final diagnoses:  Abdominal pain  Perforated viscus. Sepsis.     Myrna Blazer, MD 12/30/14 [phone]

## 2014-12-30 NOTE — Transfer of Care (Signed)
Immediate Anesthesia Transfer of Care Note  Patient: Brandy Robinson  Procedure(s) Performed: Procedure(s): EXPLORATORY LAPAROTOMY-graham patch of peptic ulcer (N/A)  Patient Location: PACU  Anesthesia Type:General  Level of Consciousness: awake, alert  and patient cooperative  Airway & Oxygen Therapy: Patient Spontanous Breathing and Patient connected to face mask oxygen  Post-op Assessment: Report given to RN and Post -op Vital signs reviewed and stable  Post vital signs: Reviewed and stable  Last Vitals:  Filed Vitals:   12/30/14 1606 12/30/14 1930  BP: 100/59 109/56  Pulse: 128 139  Temp:    Resp: 20 22    Complications: No apparent anesthesia complications

## 2014-12-30 NOTE — ED Notes (Signed)
Code sepsis called.

## 2014-12-30 NOTE — Brief Op Note (Signed)
12/30/2014  10:44 PM  PATIENT:  Brandy Robinson  49 y.o. female  PRE-OPERATIVE DIAGNOSIS:  perforated viscus  POST-OPERATIVE DIAGNOSIS:  peptic ulcer  PROCEDURE:  Procedure(s): EXPLORATORY LAPAROTOMY-graham patch of peptic ulcer (N/A)  SURGEON:  Surgeon(s) and Role:    * Ricarda Frameharles Eshaal Duby, MD - Primary  PHYSICIAN ASSISTANT:   ASSISTANTS: none   ANESTHESIA:   general  EBL:  Total I/O In: 2300 [I.V.:2300] Out: -   BLOOD ADMINISTERED:none  DRAINS: (three 3319fr round) Blake drain(s) in the right paracolic, left paracolic and the lesser sack of the stomach   LOCAL MEDICATIONS USED:  MARCAINE   , BUPIVICAINE  and Amount: 30 ml  SPECIMEN:  No Specimen  DISPOSITION OF SPECIMEN:  N/A  COUNTS:  YES  TOURNIQUET:  * No tourniquets in log *  DICTATION: .Dragon Dictation  PLAN OF CARE: Admit to inpatient   PATIENT DISPOSITION:  PACU - hemodynamically stable.   Delay start of Pharmacological VTE agent (>24hrs) due to surgical blood loss or risk of bleeding: no

## 2014-12-30 NOTE — Op Note (Signed)
Pre-operative Diagnosis:  Post-operative Diagnosis:   Surgeon: Ricarda Frameharles Antonieta Slaven   Assistants: None  Anesthesia: General endotracheal anesthesia  ASA Class: 3  Surgeon: Ricarda Frameharles Chin Wachter, MD FACS  Anesthesia: Gen. with endotracheal tube  Assistant: None  Procedure Details  The patient was seen again in the Holding Room. The benefits, complications, treatment options, and expected outcomes were discussed with the patient. The risks of bleeding, infection, recurrence of symptoms, failure to resolve symptoms,  bowel injury, any of which could require further surgery were reviewed with the patient.   The patient was taken to Operating Room, identified as Brandy Robinson and the procedure verified.  A Time Out was held and the above information confirmed.  Prior to the induction of general anesthesia, antibiotic prophylaxis was administered. VTE prophylaxis was in place. General endotracheal anesthesia was then administered and tolerated well. After the induction, the abdomen was prepped with Chloraprep and draped in the sterile fashion. The patient was positioned in the supine position.  A midline incision was made with a 10 blade scalpel and using accommodation of Bovie electrocautery and blunt dissection was taken down to the midline fascia. Midline fascia was then entered into sharply with Metzenbaum scissors and opened up this entirety with Bovie much cautery. Purulent fluid was immediately encountered and removed via suction. The source of purulent fluid was tracked to the gastric antrum where a 3 mm perforated peptic ulcer was identified. Due to lower adhesive disease the upper midline incision had been extended the entire length of the abdomen from the xiphoid to pubis. This allowed the omentum that was adhesed to the lower pelvis to be visualized and freed using comminution of sharp dissection and electrocautery.  Once all the adhesive disease was released and the abdominal contents  reviewed we eviscerated. The small bowel was run from ligament of Treitz to the ileocecal valve. The colon was able to visualize some popliteal for its entire length. There were no pathologic findings from the small or large intestine.  Attention was returned to the perforated ulcer. A Graham patch was created out of a lip of omentum using electrocautery. Using interrupted 2-0 silk the lip of omentum was sewn to the gastric antrum sealing the peptic ulcer. The abdomen was then widely drained with 19 [place] Blake drains. The first drain placed from the right lateral abdomen went under the liver into the lesser sac to medially drain the ulcer site. An additional right-sided drain was placed inferior to this end of the right pericolic gutter. The third drain was placed in the left side and the left pericolic gutter and along the greater curvature of the stomach. All inflammatory fluid was removed via suction prior to closure of the fascia.  The fascia was then reapproximated with an #1 looped PDS suture. 2 sutures were used one from superior to inferior and additional from inferior to superior. They were tied together just superior to the umbilicus without undue tension. The skin was then reapproximated with staples. All the drains were sewn into place with 3-0 nylon sutures and placed to bulb suction. A sterile dressing of a honeycomb dressing was placed over the midline incision. The patient tolerated procedure well and was awoken from general endotracheal anesthesia in the operating room. NG tube and Foley were left in place. There were no immediate complications and all counts were correct at the end the procedure.  Findings: Perforated peptic ulcer   Estimated Blood Loss: 20 mL's  Drains: three 19 French Blake drains         Specimens: None          Complications: None                  Condition: Good   Ricarda Frame, MD, FACS

## 2014-12-30 NOTE — ED Notes (Signed)
Pt aware that nurse was awaiting urine sample.  States she forgot, pt urinated and flushed toilet without getting sample.  Pt aware will need to to collect next time she has to urinate

## 2014-12-30 NOTE — ED Notes (Signed)
Pt transported to CT ?

## 2014-12-30 NOTE — Anesthesia Preprocedure Evaluation (Addendum)
Anesthesia Evaluation  Patient identified by MRN, date of birth, ID band Patient awake    Reviewed: Allergy & Precautions, NPO status , Patient's Chart, lab work & pertinent test results, reviewed documented beta blocker date and time   Airway Mallampati: II  TM Distance: >3 FB     Dental  (+) Chipped   Pulmonary Current Smoker,           Cardiovascular      Neuro/Psych PSYCHIATRIC DISORDERS Anxiety Depression Bipolar Disorder    GI/Hepatic GERD  Controlled and Medicated,  Endo/Other    Renal/GU      Musculoskeletal   Abdominal   Peds  Hematology   Anesthesia Other Findings Smokes. EKG from 04/2011 Tachy, otherwise ok. She denies allergy to hydromorphone. Does not like Morphine - N/V, itiching. Can take dilaudid.  Reproductive/Obstetrics                         Anesthesia Physical Anesthesia Plan  ASA: III  Anesthesia Plan: General   Post-op Pain Management:    Induction: Intravenous and Rapid sequence  Airway Management Planned: Oral ETT  Additional Equipment:   Intra-op Plan:   Post-operative Plan:   Informed Consent: I have reviewed the patients History and Physical, chart, labs and discussed the procedure including the risks, benefits and alternatives for the proposed anesthesia with the patient or authorized representative who has indicated his/her understanding and acceptance.     Plan Discussed with: CRNA  Anesthesia Plan Comments:       Anesthesia Quick Evaluation

## 2014-12-30 NOTE — Progress Notes (Signed)
  Met with patient in the pre-op hold area. All questions answered. Patient understands plan for exploratory laparotomy with repair of perforation. Will require continued hospital stay post operatively.  Clayburn Pert, MD FACS General Surgeon [facility]
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
## 2014-12-30 NOTE — ED Notes (Signed)
[facility], nausea, and vomiting that started last night around 10 pm. Patient denies diarrhea.

## 2014-12-31 DIAGNOSIS — R0600 Dyspnea, unspecified: Secondary | ICD-10-CM

## 2014-12-31 DIAGNOSIS — R69 Illness, unspecified: Secondary | ICD-10-CM

## 2014-12-31 LAB — MRSA PCR SCREENING: MRSA BY PCR: NEGATIVE

## 2014-12-31 LAB — BASIC METABOLIC PANEL
Anion gap: 7 (ref 5–15)
BUN: 25 mg/dL — AB (ref 6–20)
CO2: 19 mmol/L — AB (ref 22–32)
Calcium: 7.3 mg/dL — ABNORMAL LOW (ref 8.9–10.3)
Chloride: 107 mmol/L (ref 101–111)
Creatinine, Ser: 1.12 mg/dL — ABNORMAL HIGH (ref 0.44–1.00)
GFR calc Af Amer: >60 mL/min (ref >60)
GFR, EST NON AFRICAN AMERICAN: 58 mL/min — AB (ref >60)
GLUCOSE: 81 mg/dL (ref 65–99)
POTASSIUM: 4.7 mmol/L (ref 3.5–5.1)
Sodium: 133 mmol/L — ABNORMAL LOW (ref 135–145)

## 2014-12-31 LAB — PROTIME-INR
INR: 1.47
Prothrombin Time: 17.9 seconds — ABNORMAL HIGH (ref 11.4–15.0)

## 2014-12-31 LAB — CBC
HEMATOCRIT: 25.1 % — AB (ref 35.0–47.0)
Hemoglobin: 8.2 g/dL — ABNORMAL LOW (ref 12.0–16.0)
MCH: 29.8 pg (ref 26.0–34.0)
MCHC: 32.5 g/dL (ref 32.0–36.0)
MCV: 91.6 fL (ref 80.0–100.0)
PLATELETS: 329 10*3/uL (ref 150–440)
RBC: 2.74 MIL/uL — AB (ref 3.80–5.20)
RDW: 14.8 % — ABNORMAL HIGH (ref 11.5–14.5)
WBC: 8 10*3/uL (ref 3.6–11.0)

## 2014-12-31 LAB — ABO/RH: ABO/RH(D): A NEG

## 2014-12-31 LAB — GLUCOSE, CAPILLARY
GLUCOSE-CAPILLARY: 82 mg/dL (ref 65–99)
Glucose-Capillary: 81 mg/dL (ref 65–99)
Glucose-Capillary: 79 mg/dL (ref 65–99)

## 2014-12-31 MED ORDER — FENTANYL CITRATE (PF) 100 MCG/2ML IJ SOLN
12.5000 ug | INTRAMUSCULAR | Status: DC | PRN
  Administered 2014-12-31 (×2): 12.5 ug via INTRAVENOUS
  Filled 2014-12-31 (×2): qty 2

## 2014-12-31 MED ORDER — PIPERACILLIN-TAZOBACTAM 3.375 G IVPB: 3.3750 g | Freq: Four times a day (QID) | INTRAVENOUS | Status: DC

## 2014-12-31 MED ORDER — OXYCODONE-ACETAMINOPHEN 5-325 MG PO TABS: 1.0000 | ORAL_TABLET | ORAL | Status: DC | PRN

## 2014-12-31 MED ORDER — FENTANYL CITRATE (PF) 100 MCG/2ML IJ SOLN
100.0000 ug | INTRAMUSCULAR | Status: DC | PRN
  Administered 2014-12-31: 50 ug via INTRAVENOUS
  Filled 2014-12-31: qty 2

## 2014-12-31 MED ORDER — ENOXAPARIN SODIUM 40 MG/0.4ML ~~LOC~~ SOLN
40.0000 mg | SUBCUTANEOUS | Status: DC
  Administered 2014-12-31 – 2015-01-05 (×6): 40 mg via SUBCUTANEOUS
  Filled 2014-12-31 (×5): qty 0.4

## 2014-12-31 MED ORDER — HYDRALAZINE HCL 20 MG/ML IJ SOLN: 10.0000 mg | INTRAMUSCULAR | Status: DC | PRN

## 2014-12-31 MED ORDER — CETYLPYRIDINIUM CHLORIDE 0.05 % MT LIQD
7.0000 mL | Freq: Two times a day (BID) | OROMUCOSAL | Status: DC
  Administered 2014-12-31 – 2015-01-05 (×12): 7 mL via OROMUCOSAL

## 2014-12-31 MED ORDER — DIAZEPAM 5 MG/ML IJ SOLN
5.0000 mg | INTRAMUSCULAR | Status: DC | PRN
  Administered 2014-12-31 (×2): 5 mg via INTRAVENOUS
  Filled 2014-12-31 (×2): qty 2

## 2014-12-31 MED ORDER — HYDROMORPHONE HCL 1 MG/ML IJ SOLN
2.0000 mg | INTRAMUSCULAR | Status: DC | PRN
  Administered 2014-12-31 (×2): 4 mg via INTRAVENOUS
  Administered 2015-01-01 (×5): 2 mg via INTRAVENOUS
  Administered 2015-01-01: 1 mg via INTRAVENOUS
  Administered 2015-01-01 – 2015-01-04 (×14): 2 mg via INTRAVENOUS
  Administered 2015-01-04: 1 mg via INTRAVENOUS
  Administered 2015-01-04: 2 mg via INTRAVENOUS
  Administered 2015-01-04: 1 mg via INTRAVENOUS
  Administered 2015-01-04: 2 mg via INTRAVENOUS
  Filled 2014-12-31 (×12): qty 2
  Filled 2014-12-31: qty 4
  Filled 2014-12-31 (×2): qty 2
  Filled 2014-12-31: qty 1
  Filled 2014-12-31: qty 2
  Filled 2014-12-31: qty 4
  Filled 2014-12-31 (×4): qty 2
  Filled 2014-12-31 (×2): qty 4
  Filled 2014-12-31 (×2): qty 1

## 2014-12-31 MED ORDER — PANTOPRAZOLE SODIUM 40 MG IV SOLR
40.0000 mg | Freq: Two times a day (BID) | INTRAVENOUS | Status: DC
  Administered 2014-12-31 – 2015-01-05 (×12): 40 mg via INTRAVENOUS
  Filled 2014-12-31 (×12): qty 40

## 2014-12-31 MED ORDER — HYDROMORPHONE HCL 2 MG/ML IJ SOLN: 4.0000 mg | Freq: Once | INTRAMUSCULAR | Status: DC

## 2014-12-31 MED ORDER — LEVALBUTEROL HCL 0.63 MG/3ML IN NEBU
INHALATION_SOLUTION | RESPIRATORY_TRACT | Status: AC
  Administered 2014-12-31: 0.63 mg
  Filled 2014-12-31: qty 3

## 2014-12-31 MED ORDER — SODIUM CHLORIDE 0.9 % IV BOLUS (SEPSIS)
1000.0000 mL | Freq: Once | INTRAVENOUS | Status: AC
  Administered 2014-12-31: 1000 mL via INTRAVENOUS

## 2014-12-31 MED ORDER — ONDANSETRON HCL 4 MG/2ML IJ SOLN
4.0000 mg | Freq: Four times a day (QID) | INTRAMUSCULAR | Status: DC | PRN
  Administered 2015-01-07: 4 mg via INTRAVENOUS
  Filled 2014-12-31: qty 2

## 2014-12-31 MED ORDER — SODIUM CHLORIDE 0.9 % IJ SOLN
INTRAMUSCULAR | Status: AC
  Administered 2014-12-31
  Filled 2014-12-31: qty 3

## 2014-12-31 MED ORDER — LACTATED RINGERS IV SOLN
INTRAVENOUS | Status: DC
  Administered 2014-12-31: 125 mL/h via INTRAVENOUS
  Administered 2014-12-31 (×2): via INTRAVENOUS

## 2014-12-31 MED ORDER — HYDROMORPHONE HCL 1 MG/ML IJ SOLN
INTRAMUSCULAR | Status: AC
  Administered 2014-12-31: 4 mg
  Filled 2014-12-31: qty 4

## 2014-12-31 MED ORDER — ONDANSETRON 4 MG PO TBDP
4.0000 mg | ORAL_TABLET | Freq: Four times a day (QID) | ORAL | Status: DC | PRN
  Filled 2014-12-31: qty 1

## 2014-12-31 MED ORDER — DIPHENHYDRAMINE HCL 50 MG/ML IJ SOLN: 25.0000 mg | Freq: Four times a day (QID) | INTRAMUSCULAR | Status: DC | PRN

## 2014-12-31 MED ORDER — DIPHENHYDRAMINE HCL 25 MG PO CAPS: 25.0000 mg | ORAL_CAPSULE | Freq: Four times a day (QID) | ORAL | Status: DC | PRN

## 2014-12-31 NOTE — Progress Notes (Signed)
eLink Physician-Brief Progress Note Patient Name: Margaree MackintoshKimberly C Hruska DOB: [birth year] MRN: [id number]   Date of Service  12/31/2014  HPI/Events of Note  Hypoxia - Increased O2 requirement. Now on High Flow Radom with sat = 90% and RR = 21. CXR last evening - low lung volumes and basilar atelectasis.   eICU Interventions  Will order: 1. Incentive Spirometry Q 1 hour while awake.      Intervention Category Major Interventions: Hypoxemia - evaluation and management  Oracio Galen Eugene 12/31/2014, 4:19 PM

## 2014-12-31 NOTE — Progress Notes (Signed)
Notified Dr.Woodham of pt HR being elevated in the 130-140. He is aware. No new orders

## 2014-12-31 NOTE — Care Management (Signed)
patient transferred to icu due to elevated heart rate with rate in the 130's.  It is also documented that patient's post op pain was uncontrolled.  At present, it does not appear patient  has received medications for heart rate.  Consult for internal medicine is pending.  Patient is npo s/p perforated peptic ulcer repair

## 2014-12-31 NOTE — Progress Notes (Signed)
ANTIBIOTIC CONSULT NOTE - INITIAL  Pharmacy Consult for Zosyn dosing Indication: intra-abdominal infection  Allergies  Allergen Reactions  . Hydrocodone Itching  . Aspirin Other (See Comments)    Reaction:  GI upset   . Morphine And Related Itching, Nausea And Vomiting and Other (See Comments)    Reaction:  Chest pain    Patient Measurements: Height: 5\' 3"  (160 cm) Weight: 159 lb 6.3 oz (72.3 kg) IBW/kg (Calculated) : 52.4 Adjusted Body Weight: n/a  Vital Signs: Temp: 99 F (37.2 C) (12/14 1208) Temp Source: Oral (12/14 1208) BP: 98/55 mmHg (12/14 1249) Pulse Rate: 132 (12/14 1249) Intake/Output from previous day: 12/13 0701 - 12/14 0700 In: 2725 [I.V.:2725] Out: 240 [Urine:170; Drains:70] Intake/Output from this shift: Total I/O In: 170 [Other:120; IV Piggyback:50] Out: 450 [Urine:450]  Labs:  Recent Labs  12/30/14 1444 12/31/14 0659  WBC 13.5* 8.0  HGB 10.0* 8.2*  PLT 393 329  CREATININE 1.07* 1.12*   Estimated Creatinine Clearance: 59.2 mL/min (by C-G formula based on Cr of 1.12). No results for input(s): VANCOTROUGH, VANCOPEAK, VANCORANDOM, GENTTROUGH, GENTPEAK, GENTRANDOM, TOBRATROUGH, TOBRAPEAK, TOBRARND, AMIKACINPEAK, AMIKACINTROU, AMIKACIN in the last 72 hours.   Microbiology: Recent Results (from the past 720 hour(s))  Urine culture     Status: None (Preliminary result)   Collection Time: 12/30/14  7:45 PM  Result Value Ref Range Status   Specimen Description URINE, RANDOM  Final   Special Requests NONE  Final   Culture NO GROWTH < 12 HOURS  Final   Report Status PENDING  Incomplete    Medical History: Past Medical History  Diagnosis Date  . Wears glasses   . Depression   . Anxiety   . GERD (gastroesophageal reflux disease)   . Vaginal atrophy   . Lichen   . Surgical menopause   . Endometriosis   . Dyspareunia   . Insomnia   . Bipolar disorder (HCC)     Medications:   Assessment: 49 y/o F s/p exploratory laparotomy for perforated  viscus.   Plan:  Will continue Zosyn 3.375 g EI q 8 hours.   Luisa Harthristy, Bart Ashford D 12/31/2014,1:26 PM

## 2014-12-31 NOTE — Anesthesia Postprocedure Evaluation (Signed)
Anesthesia Post Note  Patient: Margaree MackintoshKimberly C Ingram  Procedure(s) Performed: Procedure(s) (LRB): EXPLORATORY LAPAROTOMY-graham patch of peptic ulcer (N/A)  Patient location during evaluation: ICU Anesthesia Type: General Level of consciousness: awake, oriented and awake and alert Pain management: pain level controlled Vital Signs Assessment: post-procedure vital signs reviewed and stable Respiratory status: spontaneous breathing and patient connected to nasal cannula oxygen Cardiovascular status: blood pressure returned to baseline and stable Postop Assessment: no signs of nausea or vomiting and adequate PO intake Anesthetic complications: no    Last Vitals:  Filed Vitals:   12/31/14 0447 12/31/14 0600  BP: 94/48 90/50  Pulse: 137 137  Temp:    Resp: 36 24    Last Pain:  Filed Vitals:   12/31/14 0746  PainSc: 10-Worst pain ever                 Laury DeepStruick,  Javarious Elsayed H

## 2014-12-31 NOTE — Progress Notes (Signed)
Patient transferred to CCU due to elevated heart rate, RR 36, O2 86-91%, last BP 92/48, HR 135-140.  Gave report to Amada JupiterDale in CCU 17.  Patient's pain was uncontrolled even after given medication on two separate occasions.  Arturo MortonClay, Shoaib Siefker N  12/31/2014  5:43 AM

## 2014-12-31 NOTE — Progress Notes (Signed)
Had difficulty time managing patient's pain- dilaudid added per Dr. Belia HemanKasa- After first dose- medicine helped to control pain and at this time patient was on 6 liters of oxygen sating in low 90's.  Then second dose of dilaudid and patient's sats dropped to high 80's- pt then placed on venti-mask then high flow.  She is now high flow at 55%FiO2 and sating 93-94%. Dr. Belia HemanKasa made aware.  Working with patient with incentive spirometer.  Family at bedside.

## 2014-12-31 NOTE — Consult Note (Signed)
[facility] Consultation     Date: 12/31/2014,   MRN# [id number] Brandy MackintoshKimberly C Robinson [birth year] Code Status:     Code Status Orders        Start     Ordered   12/31/14 0226  Full code   Continuous     12/31/14 0226     Hosp day:@LENGTHOFSTAYDAYS @ Referring MD: @ATDPROV @     PCP:      AdmissionWeight: 159 lb 6.3 oz (72.3 kg)                 CurrentWeight: 159 lb 6.3 oz (72.3 kg) Brandy MackintoshKimberly C Robinson is a 49 y.o. old female seen in consultation for elevated HR.request of Dr. Tonita CongWoodham.   EVENTS OVER NIGHT  Tra 49 year old woman who reports she was well until 24-48 PTA hours ago she developed upper abdominal pain. This was associated with loss of appetite. She developed vomiting last night, describing clear bilious fluid. Her pain exacerbated to the course the day and she presented to the emergency department for assessment. Ct abd pelvis showed perforated viscus, s/p ex lap for perf gastric ulcer TTraransferred t Transferred to ICU for elevated HR, patient s/p open lap fro perforated viscus Patient with NG in place with abd distention, in abd pain, not passing gas, Ng to low wall suction  Home Medication:  No current outpatient prescriptions on file.  Current Medication:   Current facility-administered medications:  .  antiseptic oral rinse (CPC / CETYLPYRIDINIUM CHLORIDE 0.05%) solution 7 mL, 7 mL, Mouth Rinse, BID, Ricarda Frameharles Woodham, MD, 7 mL at 12/31/14 1115 .  diazepam (VALIUM) injection 5 mg, 5 mg, Intravenous, Q4H PRN, Ricarda Frameharles Woodham, MD, 5 mg at 12/31/14 0438 .  diphenhydrAMINE (BENADRYL) capsule 25 mg, 25 mg, Oral, Q6H PRN **OR** diphenhydrAMINE (BENADRYL) injection 25 mg, 25 mg, Intravenous, Q6H PRN, Ricarda Frameharles Woodham, MD .  enoxaparin (LOVENOX) injection 40 mg, 40 mg, Subcutaneous, Q24H, Ricarda Frameharles Woodham, MD, 40 mg at 12/31/14 0745 .  fentaNYL (SUBLIMAZE) 100 MCG/2ML injection, , , ,  .  hydrALAZINE (APRESOLINE) injection 10 mg, 10 mg, Intravenous, Q2H  PRN, Ricarda Frameharles Woodham, MD .  HYDROmorphone (DILAUDID) 1 MG/ML injection, , , ,  .  HYDROmorphone (DILAUDID) injection 2-4 mg, 2-4 mg, Intravenous, Q2H PRN, Erin FullingKurian Eliseo Withers, MD .  HYDROmorphone (DILAUDID) injection 4 mg, 4 mg, Intravenous, Once, Erin FullingKurian Con Arganbright, MD, 4 mg at 12/31/14 0901 .  lactated ringers infusion, , Intravenous, Continuous, Ricarda Frameharles Woodham, MD, Last Rate: 125 mL/hr at 12/31/14 0700 .  ondansetron (ZOFRAN-ODT) disintegrating tablet 4 mg, 4 mg, Oral, Q6H PRN **OR** ondansetron (ZOFRAN) injection 4 mg, 4 mg, Intravenous, Q6H PRN, Ricarda Frameharles Woodham, MD .  oxyCODONE-acetaminophen (PERCOCET/ROXICET) 5-325 MG per tablet 1 tablet, 1 tablet, Oral, Q4H PRN, Erin FullingKurian Tyerra Loretto, MD .  pantoprazole (PROTONIX) injection 40 mg, 40 mg, Intravenous, Q12H, Ricarda Frameharles Woodham, MD, 40 mg at 12/31/14 1115 .  piperacillin-tazobactam (ZOSYN) IVPB 3.375 g, 3.375 g, Intravenous, 3 times per day, Myrna Blazeravid Matthew Schaevitz, MD, 3.375 g at 12/31/14 1115     ALLERGIES   Hydrocodone; Aspirin; and Morphine and related     REVIEW OF SYSTEMS   Review of Systems  Constitutional: Positive for malaise/fatigue. Negative for fever and chills.  Eyes: Negative for blurred vision.  Respiratory: Positive for shortness of breath. Negative for cough.   Cardiovascular: Negative for chest pain and leg swelling.  Gastrointestinal: Positive for abdominal pain. Negative for nausea.  Skin: Negative for rash.  Neurological: Negative for dizziness and headaches.  Psychiatric/Behavioral: The  patient is nervous/anxious.      VS: BP 89/48 mmHg  Pulse 128  Temp(Src) 99.3 F (37.4 C) (Axillary)  Resp 13  Ht  (1.6 m)  Wt 159 lb 6.3 oz (72.3 kg)  BMI 28.24 kg/m2  SpO2 92%     PHYSICAL EXAM   Physical Exam  Constitutional: She appears distressed.  HENT:  Head: Normocephalic and atraumatic.  Eyes: Conjunctivae are normal. Pupils are equal, round, and reactive to light.  Neck: Normal range of motion. Neck supple.    Pulmonary/Chest: Effort normal and breath sounds normal. No respiratory distress.  Abdominal: She exhibits distension. There is tenderness.  Incision intact, bandages in place  Musculoskeletal: She exhibits no edema.  Neurological: She is alert. No cranial nerve deficit.  Skin: Skin is warm. She is diaphoretic.        LABS    Recent Labs     12/30/14  1444  12/31/14  0659  HGB  10.0*  8.2*  HCT  30.9*  25.1*  MCV  90.2  91.6  WBC  13.5*  8.0  BUN  24*  25*  CREATININE  1.07*  1.12*  GLUCOSE  99  81  CALCIUM  8.7*  7.3*  INR   --   1.47  ,    No results for input(s): PH in the last 72 hours.  Invalid input(s): PCO2, PO2, BASEEXCESS, BASEDEFICITE, TFT    CULTURE RESULTS   Recent Results (from the past 240 hour(s))  Urine culture     Status: None (Preliminary result)   Collection Time: 12/30/14  7:45 PM  Result Value Ref Range Status   Specimen Description URINE, RANDOM  Final   Special Requests NONE  Final   Culture NO GROWTH < 12 HOURS  Final   Report Status PENDING  Incomplete          IMAGING    Dg Chest 1 View  12/30/2014  CLINICAL DATA:  Abdominal pain, nausea and vomiting beginning last night. Initial encounter. EXAM: CHEST 1 VIEW COMPARISON:  PA and lateral chest 08/19/2011 and 03/22/2008. FINDINGS: Lung volumes are low with basilar atelectasis. No pneumothorax or pleural effusion. Heart size is normal. IMPRESSION: Low lung volumes with bibasilar atelectasis.  No acute abnormality. Electronically Signed   By: Drusilla Kanner M.D.   On: 12/30/2014 15:40   Ct Angio Chest Aorta W/cm &/or Wo/cm  12/30/2014  CLINICAL DATA:  Patient brought in by [facility] c/o hypotension, chest pain, abdominal pain, nausea, and vomiting that started last night around 10 pm. Patient denies diarrhea. EXAM: CT ANGIOGRAPHY CHEST, ABDOMEN AND PELVIS TECHNIQUE: Multidetector CT imaging through the chest, abdomen and pelvis was performed using the standard protocol during bolus  administration of intravenous contrast. Multiplanar reconstructed images and MIPs were obtained and reviewed to evaluate the vascular anatomy. CONTRAST:  OMNIPAQUE IOHEXOL 350 MG/ML SOLN COMPARISON:  05/04/2014 FINDINGS: CHEST The noncontrast scout shows no hyperdense crescent, mediastinal hematoma, pleural or pericardial effusion. Scattered aortic arch calcifications. Right arm IV contrast injection. The SVC is patent. RV is nondilated. Satisfactory opacification of pulmonary arteries noted, and there is no evidence of pulmonary emboli. Patent pulmonary veins. Adequate contrast opacification of the thoracic aorta with no evidence of dissection, aneurysm, or stenosis. There is classic 3-vessel brachiocephalic arch anatomy without proximal stenosis. Calcified right hilar and subcarinal lymph nodes. No mediastinal adenopathy. Linear subsegmental atelectasis or scarring in the right middle and lower lobes, and inferior lingula. Review of the MIP images confirms the  above findings. ABDOMEN AND PELVIS Arterial findings: Aorta: Scattered calcified plaque. Focal penetrating atheromatous ulcer or limited nonocclusive dissection in the infrarenal segment. No aneurysm or stenosis. Celiac axis:         Patent Superior mesenteric: Patent, with replaced right hepatic arterial supply, an anatomic variant. Left renal:          Duplicated, superior dominant, both patent. Right renal:         Single, patent. Inferior mesenteric: Patent, with origin stenosis related to aortic wall plaque. Left iliac: Eccentric calcified nonocclusive plaque through the common and internal iliac arteries. External iliac widely patent. Right iliac: Scattered eccentric calcified plaque through the common iliac into the proximal internal iliac artery. External iliac widely patent. Venous findings: Dedicated venous phase imaging not obtained. Note made of patent portal, splenic and renal veins. Review of the MIP images confirms the above findings.  Nonvascular findings: Scattered freed intraperitoneal air. Mild perihepatic and perisplenic ascites. Stomach is incompletely distended, with some wall thickening suggested in the body and antrum. There is some focal gas collection contiguous with the mucosa of the gastric antrum suggesting possible gastric ulceration as the source of free intraperitoneal gas and fluid. Small bowel and colon are nondilated. Appendix not identified. No significant diverticular disease. Unremarkable liver, gallbladder, spleen and accessory splenules, adrenal glands, kidneys, pancreas. Urinary bladder is nondistended. Lumbar spine and bony pelvis unremarkable. IMPRESSION: 1. Free intraperitoneal air and fluid in the upper abdomen consistent with perforated viscus. Apparent mucosal defect in the gastric antrum and associated gas collection suggest perforated gastric ulcer. Critical Value/emergent results were called by telephone at the time of interpretation on 12/30/2014 at 5:09 pm to Dr. Gladstone Pih , who verbally acknowledged these results. 2. Negative for acute PE or thoracic aortic dissection. 3. Linear scarring or atelectasis in the lung bases right greater than left. 4. Age-advanced aortoiliac atheromatous plaque. Electronically Signed   By: Corlis Leak M.D.   On: 12/30/2014 17:09   Ct Angio Abd/pel W/ And/or W/o  12/30/2014  CLINICAL DATA:  Patient brought in by [facility] c/o hypotension, chest pain, abdominal pain, nausea, and vomiting that started last night around 10 pm. Patient denies diarrhea. EXAM: CT ANGIOGRAPHY CHEST, ABDOMEN AND PELVIS TECHNIQUE: Multidetector CT imaging through the chest, abdomen and pelvis was performed using the standard protocol during bolus administration of intravenous contrast. Multiplanar reconstructed images and MIPs were obtained and reviewed to evaluate the vascular anatomy. CONTRAST:  OMNIPAQUE IOHEXOL 350 MG/ML SOLN COMPARISON:  05/04/2014 FINDINGS: CHEST The noncontrast scout shows  no hyperdense crescent, mediastinal hematoma, pleural or pericardial effusion. Scattered aortic arch calcifications. Right arm IV contrast injection. The SVC is patent. RV is nondilated. Satisfactory opacification of pulmonary arteries noted, and there is no evidence of pulmonary emboli. Patent pulmonary veins. Adequate contrast opacification of the thoracic aorta with no evidence of dissection, aneurysm, or stenosis. There is classic 3-vessel brachiocephalic arch anatomy without proximal stenosis. Calcified right hilar and subcarinal lymph nodes. No mediastinal adenopathy. Linear subsegmental atelectasis or scarring in the right middle and lower lobes, and inferior lingula. Review of the MIP images confirms the above findings. ABDOMEN AND PELVIS Arterial findings: Aorta: Scattered calcified plaque. Focal penetrating atheromatous ulcer or limited nonocclusive dissection in the infrarenal segment. No aneurysm or stenosis. Celiac axis:         Patent Superior mesenteric: Patent, with replaced right hepatic arterial supply, an anatomic variant. Left renal:          Duplicated, superior dominant, both  patent. Right renal:         Single, patent. Inferior mesenteric: Patent, with origin stenosis related to aortic wall plaque. Left iliac: Eccentric calcified nonocclusive plaque through the common and internal iliac arteries. External iliac widely patent. Right iliac: Scattered eccentric calcified plaque through the common iliac into the proximal internal iliac artery. External iliac widely patent. Venous findings: Dedicated venous phase imaging not obtained. Note made of patent portal, splenic and renal veins. Review of the MIP images confirms the above findings. Nonvascular findings: Scattered freed intraperitoneal air. Mild perihepatic and perisplenic ascites. Stomach is incompletely distended, with some wall thickening suggested in the body and antrum. There is some focal gas collection contiguous with the mucosa of  the gastric antrum suggesting possible gastric ulceration as the source of free intraperitoneal gas and fluid. Small bowel and colon are nondilated. Appendix not identified. No significant diverticular disease. Unremarkable liver, gallbladder, spleen and accessory splenules, adrenal glands, kidneys, pancreas. Urinary bladder is nondistended. Lumbar spine and bony pelvis unremarkable. IMPRESSION: 1. Free intraperitoneal air and fluid in the upper abdomen consistent with perforated viscus. Apparent mucosal defect in the gastric antrum and associated gas collection suggest perforated gastric ulcer. Critical Value/emergent results were called by telephone at the time of interpretation on 12/30/2014 at 5:09 pm to Dr. Gladstone Pih , who verbally acknowledged these results. 2. Negative for acute PE or thoracic aortic dissection. 3. Linear scarring or atelectasis in the lung bases right greater than left. 4. Age-advanced aortoiliac atheromatous plaque. Electronically Signed   By: Corlis Leak M.D.   On: 12/30/2014 17:09        ASSESSMENT/PLAN   49 yo white female admitted to ICU for closer monitoring of BP/HR. likely cause of elevated HR and SOB likey from pain and abd distention with atelectasis, PE less likely at this time  1.oxygen as needed 2.incentive spirometry 3.pain meds as needed 4.NG to suction-follow up surgery recs  Place in step down status.   I have personally obtained a history, examined the patient, evaluated laboratory and independently reviewed imaging results, formulated the assessment and plan and placed orders.  The Patient requires high complexity decision making for assessment and support, frequent evaluation and titration of therapies, application of advanced monitoring technologies and extensive interpretation of multiple databases.     Lucie Leather, M.D.  [facility]  Medical Director [facility] Medical Director [facility]  Cardio-Pulmonary Department

## 2014-12-31 NOTE — Progress Notes (Signed)
Notified spouse of patient transfer to CCU 17.  Brandy MortonClay, [name]   12/31/2014  6:52 AM

## 2014-12-31 NOTE — Progress Notes (Signed)
eLink Physician-Brief Progress Note Patient Name: Margaree MackintoshKimberly C Petion DOB: 1965/12/09 MRN: [id number]   Date of Service  12/31/2014  HPI/Events of Note  49 yo with perf  Viscus, s/p ex lap with graham patch to peptic ulcer, transferred to ICU for elevated HR  eICU Interventions  Pain management  BP control Maintain o2 sats>90% May need re-evaluation by surgery      Intervention Category Evaluation Type: New Patient Evaluation  Abdou Stocks 12/31/2014, 6:00 AM

## 2014-12-31 NOTE — Consult Note (Signed)
Seen around 11 am.  Consult called in for oral meds and tachycardia.    Likely tachycardia is due to pain, On dilaudid now.  We can hold other oral meds for now. Will cont to follow.  Full consult note to follow.

## 2014-12-31 NOTE — Progress Notes (Signed)
Called Dr Tonita CongWoodham about patient's pain level.  Doctor ordered 5mg  valium iv q4 prn for muscle spasms.  Arturo MortonClay, Allanah Mcfarland N  12/31/2014  4:27 AM

## 2014-12-31 NOTE — Progress Notes (Signed)
1 Day Post-Op   Subjective:  Patient continues to have significant pain issues. She does state that her pain is different than before surgery. She has had issues with oxygenation and tachycardia but both have improved with improved pain control. Patient continues to ask for ice chips and states her mouth is dry.  Vital signs in last 24 hours: Temp:  [98.1 F (36.7 C)-99.8 F (37.7 C)] 99.2 F (37.3 C) (12/14 1900) Pulse Rate:  [125-137] 125 (12/14 2100) Resp:  [13-36] 19 (12/14 2100) BP: (89-109)/(42-75) 97/54 mmHg (12/14 2100) SpO2:  [87 %-99 %] 96 % (12/14 2100) FiO2 (%):  [50 %-58 %] 50 % (12/14 2004) Last BM Date: 12/30/14  Intake/Output from previous day: 12/13 0701 - 12/14 0700 In: 2725 [I.V.:2725] Out: 240 [Urine:170; Drains:70]  GI: Abdomen is soft, appropriately TTP at incision sites, JP drains in palce with serosanguinous output on the right and seropurlent output on the left.  Lab Results:  CBC  Recent Labs  12/30/14 1444 12/31/14 0659  WBC 13.5* 8.0  HGB 10.0* 8.2*  HCT 30.9* 25.1*  PLT 393 329   CMP     Component Value Date/Time   NA 133* 12/31/2014 0659   NA 139 05/15/2014 2120   K 4.7 12/31/2014 0659   K 4.0 05/15/2014 2120   CL 107 12/31/2014 0659   CL 110 05/15/2014 2120   CO2 19* 12/31/2014 0659   CO2 22 05/15/2014 2120   GLUCOSE 81 12/31/2014 0659   GLUCOSE 88 05/15/2014 2120   BUN 25* 12/31/2014 0659   BUN 10 05/15/2014 2120   CREATININE 1.12* 12/31/2014 0659   CREATININE 0.78 05/15/2014 2120   CALCIUM 7.3* 12/31/2014 0659   CALCIUM 9.3 05/15/2014 2120   PROT 6.3* 12/30/2014 1444   PROT 7.2 05/15/2014 2120   ALBUMIN 2.6* 12/30/2014 1444   ALBUMIN 3.7 05/15/2014 2120   AST 12* 12/30/2014 1444   AST 17 05/15/2014 2120   ALT 10* 12/30/2014 1444   ALT 11* 05/15/2014 2120   ALKPHOS 131* 12/30/2014 1444   ALKPHOS 103 05/15/2014 2120   BILITOT 0.3 12/30/2014 1444   BILITOT 0.4 05/15/2014 2120   GFRNONAA 58* 12/31/2014 0659   GFRNONAA  >60 05/15/2014 2120   GFRNONAA >60 04/25/2011 0755   GFRAA >60 12/31/2014 0659   GFRAA >60 05/15/2014 2120   GFRAA >60 04/25/2011 0755   PT/INR  Recent Labs  12/31/14 0659  LABPROT 17.9*  INR 1.47    Studies/Results: Dg Chest 1 View  12/30/2014  CLINICAL DATA:  Abdominal pain, nausea and vomiting beginning last night. Initial encounter. EXAM: CHEST 1 VIEW COMPARISON:  PA and lateral chest 08/19/2011 and 03/22/2008. FINDINGS: Lung volumes are low with basilar atelectasis. No pneumothorax or pleural effusion. Heart size is normal. IMPRESSION: Low lung volumes with bibasilar atelectasis.  No acute abnormality. Electronically Signed   By: Drusilla Kanner M.D.   On: 12/30/2014 15:40   Ct Angio Chest Aorta W/cm &/or Wo/cm  12/30/2014  CLINICAL DATA:  Patient brought in by [facility] c/o hypotension, chest pain, abdominal pain, nausea, and vomiting that started last night around 10 pm. Patient denies diarrhea. EXAM: CT ANGIOGRAPHY CHEST, ABDOMEN AND PELVIS TECHNIQUE: Multidetector CT imaging through the chest, abdomen and pelvis was performed using the standard protocol during bolus administration of intravenous contrast. Multiplanar reconstructed images and MIPs were obtained and reviewed to evaluate the vascular anatomy. CONTRAST:  OMNIPAQUE IOHEXOL 350 MG/ML SOLN COMPARISON:  05/04/2014 FINDINGS: CHEST The noncontrast scout shows no hyperdense  crescent, mediastinal hematoma, pleural or pericardial effusion. Scattered aortic arch calcifications. Right arm IV contrast injection. The SVC is patent. RV is nondilated. Satisfactory opacification of pulmonary arteries noted, and there is no evidence of pulmonary emboli. Patent pulmonary veins. Adequate contrast opacification of the thoracic aorta with no evidence of dissection, aneurysm, or stenosis. There is classic 3-vessel brachiocephalic arch anatomy without proximal stenosis. Calcified right hilar and subcarinal lymph nodes. No mediastinal  adenopathy. Linear subsegmental atelectasis or scarring in the right middle and lower lobes, and inferior lingula. Review of the MIP images confirms the above findings. ABDOMEN AND PELVIS Arterial findings: Aorta: Scattered calcified plaque. Focal penetrating atheromatous ulcer or limited nonocclusive dissection in the infrarenal segment. No aneurysm or stenosis. Celiac axis:         Patent Superior mesenteric: Patent, with replaced right hepatic arterial supply, an anatomic variant. Left renal:          Duplicated, superior dominant, both patent. Right renal:         Single, patent. Inferior mesenteric: Patent, with origin stenosis related to aortic wall plaque. Left iliac: Eccentric calcified nonocclusive plaque through the common and internal iliac arteries. External iliac widely patent. Right iliac: Scattered eccentric calcified plaque through the common iliac into the proximal internal iliac artery. External iliac widely patent. Venous findings: Dedicated venous phase imaging not obtained. Note made of patent portal, splenic and renal veins. Review of the MIP images confirms the above findings. Nonvascular findings: Scattered freed intraperitoneal air. Mild perihepatic and perisplenic ascites. Stomach is incompletely distended, with some wall thickening suggested in the body and antrum. There is some focal gas collection contiguous with the mucosa of the gastric antrum suggesting possible gastric ulceration as the source of free intraperitoneal gas and fluid. Small bowel and colon are nondilated. Appendix not identified. No significant diverticular disease. Unremarkable liver, gallbladder, spleen and accessory splenules, adrenal glands, kidneys, pancreas. Urinary bladder is nondistended. Lumbar spine and bony pelvis unremarkable. IMPRESSION: 1. Free intraperitoneal air and fluid in the upper abdomen consistent with perforated viscus. Apparent mucosal defect in the gastric antrum and associated gas collection  suggest perforated gastric ulcer. Critical Value/emergent results were called by telephone at the time of interpretation on 12/30/2014 at 5:09 pm to Dr. DAVID SCHAEVITZ , who verbally acknowledged these results. 2. Negative for acute PE or thoracic aortic dissection. 3. Linear scarring or atelectasis in the lung bases right greater than left. 4. Age-advanced aortoiliac atheromatous plaque. Electronically Signed   By: D  Hassell M.D.   On: 12/30/2014 17:09   Ct Angio Abd/pel W/ And/or W/o  12/30/2014  CLINICAL DATA:  Patient brought in by [facility] c/o hypotension, chest pain, abdominal pain, nausea, and vomiting that started last night around 10 pm. Patient denies diarrhea. EXAM: CT ANGIOGRAPHY CHEST, ABDOMEN AND PELVIS TECHNIQUE: Multidetector CT imaging through the chest, abdomen and pelvis was performed using the standard protocol during bolus administration of intravenous contrast. Multiplanar reconstructed images and MIPs were obtained and reviewed to evaluate the vascular anatomy. CONTRAST:  <MEASUREM<MEASUREMEN<MEASUREMEN T> OMNIPAQUE IOHEXOL 350 MG/ML SOLN COMPARISON:  05/04/2014 FINDINGS: CHEST The noncontrast scout shows no hyperdense crescent, mediastinal hematoma, pleural or pericardial effusion. Scattered aortic arch calcifications. Right arm IV contrast injection. The SVC is patent. RV is nondilated. Satisfactory opacification of pulmonary arteries noted, and there is no evidence of pulmonary emboli. Patent pulmonary veins. Adequate contrast opacification of the thoracic aorta with no evidence of dissection, aneurysm, or stenosis. There is classic 3-vessel brachiocephalic arch anatomy without proximal stenosis.  Calcified right hilar and subcarinal lymph nodes. No mediastinal adenopathy. Linear subsegmental atelectasis or scarring in the right middle and lower lobes, and inferior lingula. Review of the MIP images confirms the above findings. ABDOMEN AND PELVIS Arterial findings: Aorta: Scattered calcified plaque. Focal penetrating  atheromatous ulcer or limited nonocclusive dissection in the infrarenal segment. No aneurysm or stenosis. Celiac axis:         Patent Superior mesenteric: Patent, with replaced right hepatic arterial supply, an anatomic variant. Left renal:          Duplicated, superior dominant, both patent. Right renal:         Single, patent. Inferior mesenteric: Patent, with origin stenosis related to aortic wall plaque. Left iliac: Eccentric calcified nonocclusive plaque through the common and internal iliac arteries. External iliac widely patent. Right iliac: Scattered eccentric calcified plaque through the common iliac into the proximal internal iliac artery. External iliac widely patent. Venous findings: Dedicated venous phase imaging not obtained. Note made of patent portal, splenic and renal veins. Review of the MIP images confirms the above findings. Nonvascular findings: Scattered freed intraperitoneal air. Mild perihepatic and perisplenic ascites. Stomach is incompletely distended, with some wall thickening suggested in the body and antrum. There is some focal gas collection contiguous with the mucosa of the gastric antrum suggesting possible gastric ulceration as the source of free intraperitoneal gas and fluid. Small bowel and colon are nondilated. Appendix not identified. No significant diverticular disease. Unremarkable liver, gallbladder, spleen and accessory splenules, adrenal glands, kidneys, pancreas. Urinary bladder is nondistended. Lumbar spine and bony pelvis unremarkable. IMPRESSION: 1. Free intraperitoneal air and fluid in the upper abdomen consistent with perforated viscus. Apparent mucosal defect in the gastric antrum and associated gas collection suggest perforated gastric ulcer. Critical Value/emergent results were called by telephone at the time of interpretation on 12/30/2014 at 5:09 pm to Dr. Gladstone Pih , who verbally acknowledged these results. 2. Negative for acute PE or thoracic aortic  dissection. 3. Linear scarring or atelectasis in the lung bases right greater than left. 4. Age-advanced aortoiliac atheromatous plaque. Electronically Signed   By: Corlis Leak M.D.   On: 12/30/2014 17:09    Assessment/Plan: 49 y/o female s/p exploratory laparotomy with repair of perforated gastric ulcer with graham patch and wide drainage of abdominal spillage. Appreciate medicine and critical care assistance with patient. Patient will need to remain strict NPO for at least 3 days due to her ulcer repair. Continue stepdown unit until pain management and oxygenation improve.   Ricarda Frame, MD FACS General Surgeon  12/31/2014

## 2014-12-31 NOTE — Progress Notes (Signed)
Spoke with Dr. Tonita CongWoodham regarding patient's heart rate.  Per Dr. Tonita CongWoodham, if fentanyl does not bring heart rate down, then a bolus of normal saline should be given.  Arturo MortonClay, Gordon Vandunk N  12/31/2014  4:25 AM

## 2015-01-01 ENCOUNTER — Inpatient Hospital Stay: Payer: Self-pay

## 2015-01-01 ENCOUNTER — Inpatient Hospital Stay
Admit: 2015-01-01 | Discharge: 2015-01-01 | Disposition: A | Discharge status: Home / Self Care | Payer: Self-pay | Attending: Internal Medicine | Admitting: Internal Medicine

## 2015-01-01 ENCOUNTER — Encounter: Payer: Self-pay | Admitting: General Surgery

## 2015-01-01 LAB — URINE CULTURE: CULTURE: NO GROWTH

## 2015-01-01 LAB — BASIC METABOLIC PANEL
ANION GAP: 5 (ref 5–15)
BUN: 20 mg/dL (ref 6–20)
CHLORIDE: 104 mmol/L (ref 101–111)
CO2: 25 mmol/L (ref 22–32)
Calcium: 7.5 mg/dL — ABNORMAL LOW (ref 8.9–10.3)
Creatinine, Ser: 0.83 mg/dL (ref 0.44–1.00)
GFR calc Af Amer: >60 mL/min (ref >60)
GLUCOSE: 75 mg/dL (ref 65–99)
POTASSIUM: 4.6 mmol/L (ref 3.5–5.1)
Sodium: 134 mmol/L — ABNORMAL LOW (ref 135–145)

## 2015-01-01 LAB — CBC
HEMATOCRIT: 25.9 % — AB (ref 35.0–47.0)
HEMOGLOBIN: 8.3 g/dL — AB (ref 12.0–16.0)
MCH: 29.6 pg (ref 26.0–34.0)
MCHC: 32.2 g/dL (ref 32.0–36.0)
MCV: 91.9 fL (ref 80.0–100.0)
Platelets: 351 10*3/uL (ref 150–440)
RBC: 2.82 MIL/uL — AB (ref 3.80–5.20)
RDW: 15 % — ABNORMAL HIGH (ref 11.5–14.5)
WBC: 12.4 10*3/uL — AB (ref 3.6–11.0)

## 2015-01-01 MED ORDER — DEXTROSE-NACL 5-0.9 % IV SOLN
INTRAVENOUS | Status: DC
  Administered 2015-01-01 – 2015-01-04 (×4): via INTRAVENOUS

## 2015-01-01 MED ORDER — FUROSEMIDE 10 MG/ML IJ SOLN
60.0000 mg | Freq: Once | INTRAMUSCULAR | Status: AC
  Administered 2015-01-01: 60 mg via INTRAVENOUS
  Filled 2015-01-01: qty 6

## 2015-01-01 MED ORDER — ACETAMINOPHEN 650 MG RE SUPP: 650.0000 mg | Freq: Four times a day (QID) | RECTAL | Status: DC | PRN

## 2015-01-01 MED ORDER — IPRATROPIUM-ALBUTEROL 0.5-2.5 (3) MG/3ML IN SOLN
3.0000 mL | Freq: Four times a day (QID) | RESPIRATORY_TRACT | Status: DC
  Administered 2015-01-02: 3 mL via RESPIRATORY_TRACT
  Filled 2015-01-01: qty 3

## 2015-01-01 MED ORDER — IPRATROPIUM-ALBUTEROL 0.5-2.5 (3) MG/3ML IN SOLN
3.0000 mL | RESPIRATORY_TRACT | Status: DC
  Administered 2015-01-01: 3 mL via RESPIRATORY_TRACT
  Filled 2015-01-01: qty 3

## 2015-01-01 MED ORDER — KCL IN DEXTROSE-NACL 20-5-0.45 MEQ/L-%-% IV SOLN
INTRAVENOUS | Status: DC
  Administered 2015-01-01: 08:00:00 via INTRAVENOUS
  Filled 2015-01-01 (×3): qty 1000

## 2015-01-01 NOTE — Progress Notes (Signed)
*  PRELIMINARY RESULTS* Echocardiogram 2D Echocardiogram has been performed.  Brandy Robinson 01/01/2015, 3:34 PM

## 2015-01-01 NOTE — Progress Notes (Signed)
Patient more alert, now off HFNC, currently on 5L [place] with pulse sat 92%.  PRN dilaudid administered for abdominal, pain better controlled now complaining of less abdominal pain. Abdominal dressing c/d/i.  JP drains x3 with small serous output. Vital signs stable, sinus tachycardia on cardiac monitor HR improving now 110s.  NG on LIS with no more output, foley in place and intact with excellent urinary output.  Patient on D5NS at 3975ml/hr.   Chest x-ray and Echo done today.  Patient currently resting in no apparent distress.  See [facility] for further details.

## 2015-01-01 NOTE — Consult Note (Signed)
[facility] [place] [facility]   PATIENT NAME: Brandy Robinson    MR#:  [id number]  DATE OF BIRTH:  October 07, 1965  DATE OF ADMISSION:  12/30/2014  PRIMARY CARE PHYSICIAN: Pcp Not In System   REQUESTING/REFERRING PHYSICIAN: Dr. Zada FindersWoodhard  Reason for consult- Tachycardia.  CHIEF COMPLAINT:   Chief Complaint  Patient presents with  . Abdominal Pain    HISTORY OF PRESENT ILLNESS: Brandy PyleKimberly Roberti  is a 49 y.o. female with a known history of depression, anxiety, gastric reflux, insomnia, bipolar disorder- takes ibuprofen and Goody powders at home for her pain. He came to emergency room in night with severe abdominal pain and found having free air in peritoneum, taken to the OR by surgical team for perforated gastric ulcer, and postsurgery admitted in stepdown unit. She remained tachycardiac and could not be given any oral medications so medical consult was called in.   during my interview she complained of severe abdominal pain and has been moving because of that- blood pressure is stable. She denies any cardiac history.  PAST MEDICAL HISTORY:   Past Medical History  Diagnosis Date  . Wears glasses   . Depression   . Anxiety   . GERD (gastroesophageal reflux disease)   . Vaginal atrophy   . Lichen   . Surgical menopause   . Endometriosis   . Dyspareunia   . Insomnia   . Bipolar disorder (HCC)     PAST SURGICAL HISTORY:  Past Surgical History  Procedure Laterality Date  . Abdominal hysterectomy  1995    [facility]  . Arm wound repair / closure  2013    cellulitis rt arm-i/d  . Knee bursectomy Left 09/24/2013    Procedure: IRRIGATION AND DEBRIDEMENT OF LEFT KNEE ;  Surgeon: Sheral Apleyimothy D Murphy, MD;  Location: [facility];  Service: Orthopedics;  Laterality: Left;    SOCIAL HISTORY:  Social History  Substance Use Topics  . Smoking status: Current Every Day Smoker -- 1.00 packs/day    Types: Cigarettes  . Smokeless tobacco: Not on file  .  Alcohol Use: No    FAMILY HISTORY:  Family History  Problem Relation Age of Onset  . Diabetes Mother   . Diabetes Sister   . Cancer Neg Hx   . Heart disease Neg Hx     DRUG ALLERGIES:  Allergies  Allergen Reactions  . Hydrocodone Itching  . Aspirin Other (See Comments)    Reaction:  GI upset   . Morphine And Related Itching, Nausea And Vomiting and Other (See Comments)    Reaction:  Chest pain    REVIEW OF SYSTEMS:   CONSTITUTIONAL: No fever, fatigue or weakness.  EYES: No blurred or double vision.  EARS, NOSE, AND THROAT: No tinnitus or ear pain.  RESPIRATORY: No cough, shortness of breath, wheezing or hemoptysis.  CARDIOVASCULAR: No chest pain, orthopnea, edema.  GASTROINTESTINAL: No nausea, vomiting, diarrhea , s/p surgery - still have severe abdominal pain.  GENITOURINARY: No dysuria, hematuria.  ENDOCRINE: No polyuria, nocturia,  HEMATOLOGY: No anemia, easy bruising or bleeding SKIN: No rash or lesion. MUSCULOSKELETAL: No joint pain or arthritis.   NEUROLOGIC: No tingling, numbness, weakness.  PSYCHIATRY: No anxiety or depression.   MEDICATIONS AT HOME:  Prior to Admission medications   Medication Sig Start Date End Date Taking? Authorizing Provider  buPROPion (WELLBUTRIN SR) 150 MG 12 hr tablet Take 150 mg by mouth daily.   Yes Historical Provider, MD  busPIRone (BUSPAR) 10 MG tablet  Take 20 mg by mouth 3 (three) times daily.    Yes Historical Provider, MD  citalopram (CELEXA) 20 MG tablet Take 20 mg by mouth at bedtime.    Yes Historical Provider, MD  clonazePAM (KLONOPIN) 1 MG tablet Take 1 mg by mouth at bedtime.   Yes Historical Provider, MD  conjugated estrogens (PREMARIN) vaginal cream Place 0.5 Applicatorfuls vaginally every Monday, Wednesday, and Friday.   Yes Historical Provider, MD  estradiol (ESTRACE) 1 MG tablet Take 1 tablet (1 mg total) by mouth daily. 09/24/14  Yes Prentice Docker Defrancesco, MD  metoCLOPramide (REGLAN) 10 MG tablet Take 1 tablet (10 mg  total) by mouth 3 (three) times daily with meals. 11/07/14 11/07/15 Yes Minna Antis, MD  mometasone (ELOCON) 0.1 % ointment Apply 1 application topically 2 (two) times a week.   Yes Historical Provider, MD  omeprazole (PRILOSEC) 20 MG capsule Take 20 mg by mouth 2 (two) times daily.    Yes Historical Provider, MD  QUEtiapine (SEROQUEL XR) 300 MG 24 hr tablet Take 300 mg by mouth at bedtime.   Yes Historical Provider, MD  QUEtiapine (SEROQUEL) 50 MG tablet Take 50 mg by mouth 2 (two) times daily.    Yes Historical Provider, MD  oxyCODONE-acetaminophen (ROXICET) 5-325 MG tablet Take 1 tablet by mouth every 6 (six) hours as needed. Patient not taking: Reported on 12/30/2014 11/07/14   Minna Antis, MD      PHYSICAL EXAMINATION:   VITAL SIGNS: Blood pressure 106/55, pulse 121, temperature 99 F (37.2 C), temperature source Axillary, resp. rate 19, height  (1.6 m), weight 75.9 kg (167 lb 5.3 oz), SpO2 91 %.  GENERAL:  49 y.o.-year-old patient lying in the bed with acute distress due to pain.  EYES: Pupils equal, round, reactive to light and accommodation. No scleral icterus. Extraocular muscles intact.  HEENT: Head atraumatic, normocephalic. Oropharynx and nasopharynx clear.  NECK:  Supple, no jugular venous distention. No thyroid enlargement, no tenderness.  LUNGS: Normal breath sounds bilaterally, no wheezing, rales,rhonchi or crepitation. No use of accessory muscles of respiration.  CARDIOVASCULAR: S1, S2 normal- with tachycardia. No murmurs.  ABDOMEN: Soft, tender, nondistended. Bowel sounds present. No organomegaly or mass.  surgical dressing present.  EXTREMITIES: No pedal edema, cyanosis, or clubbing.  NEUROLOGIC: Cranial nerves II through XII are intact. Muscle strength 5/5 in all extremities. Sensation intact. Gait not checked.  PSYCHIATRIC: The patient is alert and oriented x 3.  SKIN: No obvious rash, lesion, or ulcer.   LABORATORY PANEL:   CBC  Recent Labs Lab  12/30/14 1444 12/31/14 0659 01/01/15 0412  WBC 13.5* 8.0 12.4*  HGB 10.0* 8.2* 8.3*  HCT 30.9* 25.1* 25.9*  PLT 393 329 351  MCV 90.2 91.6 91.9  MCH 29.3 29.8 29.6  MCHC 32.5 32.5 32.2  RDW 14.8* 14.8* 15.0*  LYMPHSABS 1.0  --   --   MONOABS 0.8  --   --   EOSABS 0.0  --   --   BASOSABS 0.0  --   --    ------------------------------------------------------------------------------------------------------------------  Chemistries   Recent Labs Lab 12/30/14 1444 12/31/14 0659 01/01/15 0412  NA 132* 133* 134*  K 4.0 4.7 4.6  CL 97* 107 104  CO2 20* 19* 25  GLUCOSE 99 81 75  BUN 24* 25* 20  CREATININE 1.07* 1.12* 0.83  CALCIUM 8.7* 7.3* 7.5*  AST 12*  --   --   ALT 10*  --   --   ALKPHOS 131*  --   --  BILITOT 0.3  --   --    ------------------------------------------------------------------------------------------------------------------ estimated creatinine clearance is 81.7 mL/min (by C-G formula based on Cr of 0.83). ------------------------------------------------------------------------------------------------------------------ No results for input(s): TSH, T4TOTAL, T3FREE, THYROIDAB in the last 72 hours.  Invalid input(s): FREET3   Coagulation profile  Recent Labs Lab 12/31/14 0659  INR 1.47   ------------------------------------------------------------------------------------------------------------------- No results for input(s): DDIMER in the last 72 hours. -------------------------------------------------------------------------------------------------------------------  Cardiac Enzymes  Recent Labs Lab 12/30/14 1444  TROPONINI <0.03   ------------------------------------------------------------------------------------------------------------------ Invalid input(s): POCBNP  ---------------------------------------------------------------------------------------------------------------  Urinalysis    Component Value Date/Time   COLORURINE  YELLOW* 12/30/2014 1945   COLORURINE Straw 05/15/2014 2120   APPEARANCEUR CLEAR* 12/30/2014 1945   APPEARANCEUR Clear 05/15/2014 2120   LABSPEC >1.060* 12/30/2014 1945   LABSPEC 1.006 05/15/2014 2120   PHURINE 5.0 12/30/2014 1945   PHURINE 5.0 05/15/2014 2120   GLUCOSEU NEGATIVE 12/30/2014 1945   GLUCOSEU Negative 05/15/2014 2120   HGBUR NEGATIVE 12/30/2014 1945   HGBUR Negative 05/15/2014 2120   BILIRUBINUR NEGATIVE 12/30/2014 1945   BILIRUBINUR Negative 05/15/2014 2120   KETONESUR NEGATIVE 12/30/2014 1945   KETONESUR Negative 05/15/2014 2120   PROTEINUR NEGATIVE 12/30/2014 1945   PROTEINUR Negative 05/15/2014 2120   NITRITE NEGATIVE 12/30/2014 1945   NITRITE Negative 05/15/2014 2120   LEUKOCYTESUR 1+* 12/30/2014 1945   LEUKOCYTESUR Trace 05/15/2014 2120     RADIOLOGY: Dg Chest 1 View  12/30/2014  CLINICAL DATA:  Abdominal pain, nausea and vomiting beginning last night. Initial encounter. EXAM: CHEST 1 VIEW COMPARISON:  PA and lateral chest 08/19/2011 and 03/22/2008. FINDINGS: Lung volumes are low with basilar atelectasis. No pneumothorax or pleural effusion. Heart size is normal. IMPRESSION: Low lung volumes with bibasilar atelectasis.  No acute abnormality. Electronically Signed   By: Drusilla Kanner M.D.   On: 12/30/2014 15:40   Ct Angio Chest Aorta W/cm &/or Wo/cm  12/30/2014  CLINICAL DATA:  Patient brought in by [facility] c/o hypotension, chest pain, abdominal pain, nausea, and vomiting that started last night around 10 pm. Patient denies diarrhea. EXAM: CT ANGIOGRAPHY CHEST, ABDOMEN AND PELVIS TECHNIQUE: Multidetector CT imaging through the chest, abdomen and pelvis was performed using the standard protocol during bolus administration of intravenous contrast. Multiplanar reconstructed images and MIPs were obtained and reviewed to evaluate the vascular anatomy. CONTRAST:  OMNIPAQUE IOHEXOL 350 MG/ML SOLN COMPARISON:  05/04/2014 FINDINGS: CHEST The noncontrast scout shows no  hyperdense crescent, mediastinal hematoma, pleural or pericardial effusion. Scattered aortic arch calcifications. Right arm IV contrast injection. The SVC is patent. RV is nondilated. Satisfactory opacification of pulmonary arteries noted, and there is no evidence of pulmonary emboli. Patent pulmonary veins. Adequate contrast opacification of the thoracic aorta with no evidence of dissection, aneurysm, or stenosis. There is classic 3-vessel brachiocephalic arch anatomy without proximal stenosis. Calcified right hilar and subcarinal lymph nodes. No mediastinal adenopathy. Linear subsegmental atelectasis or scarring in the right middle and lower lobes, and inferior lingula. Review of the MIP images confirms the above findings. ABDOMEN AND PELVIS Arterial findings: Aorta: Scattered calcified plaque. Focal penetrating atheromatous ulcer or limited nonocclusive dissection in the infrarenal segment. No aneurysm or stenosis. Celiac axis:         Patent Superior mesenteric: Patent, with replaced right hepatic arterial supply, an anatomic variant. Left renal:          Duplicated, superior dominant, both patent. Right renal:         Single, patent. Inferior mesenteric: Patent, with origin stenosis related to aortic wall plaque. Left  iliac: Eccentric calcified nonocclusive plaque through the common and internal iliac arteries. External iliac widely patent. Right iliac: Scattered eccentric calcified plaque through the common iliac into the proximal internal iliac artery. External iliac widely patent. Venous findings: Dedicated venous phase imaging not obtained. Note made of patent portal, splenic and renal veins. Review of the MIP images confirms the above findings. Nonvascular findings: Scattered freed intraperitoneal air. Mild perihepatic and perisplenic ascites. Stomach is incompletely distended, with some wall thickening suggested in the body and antrum. There is some focal gas collection contiguous with the mucosa of the  gastric antrum suggesting possible gastric ulceration as the source of free intraperitoneal gas and fluid. Small bowel and colon are nondilated. Appendix not identified. No significant diverticular disease. Unremarkable liver, gallbladder, spleen and accessory splenules, adrenal glands, kidneys, pancreas. Urinary bladder is nondistended. Lumbar spine and bony pelvis unremarkable. IMPRESSION: 1. Free intraperitoneal air and fluid in the upper abdomen consistent with perforated viscus. Apparent mucosal defect in the gastric antrum and associated gas collection suggest perforated gastric ulcer. Critical Value/emergent results were called by telephone at the time of interpretation on 12/30/2014 at 5:09 pm to Dr. Gladstone Pih , who verbally acknowledged these results. 2. Negative for acute PE or thoracic aortic dissection. 3. Linear scarring or atelectasis in the lung bases right greater than left. 4. Age-advanced aortoiliac atheromatous plaque. Electronically Signed   By: Corlis Leak M.D.   On: 12/30/2014 17:09   Ct Angio Abd/pel W/ And/or W/o  12/30/2014  CLINICAL DATA:  Patient brought in by [facility] c/o hypotension, chest pain, abdominal pain, nausea, and vomiting that started last night around 10 pm. Patient denies diarrhea. EXAM: CT ANGIOGRAPHY CHEST, ABDOMEN AND PELVIS TECHNIQUE: Multidetector CT imaging through the chest, abdomen and pelvis was performed using the standard protocol during bolus administration of intravenous contrast. Multiplanar reconstructed images and MIPs were obtained and reviewed to evaluate the vascular anatomy. CONTRAST:  OMNIPAQUE IOHEXOL 350 MG/ML SOLN COMPARISON:  05/04/2014 FINDINGS: CHEST The noncontrast scout shows no hyperdense crescent, mediastinal hematoma, pleural or pericardial effusion. Scattered aortic arch calcifications. Right arm IV contrast injection. The SVC is patent. RV is nondilated. Satisfactory opacification of pulmonary arteries noted, and there is no  evidence of pulmonary emboli. Patent pulmonary veins. Adequate contrast opacification of the thoracic aorta with no evidence of dissection, aneurysm, or stenosis. There is classic 3-vessel brachiocephalic arch anatomy without proximal stenosis. Calcified right hilar and subcarinal lymph nodes. No mediastinal adenopathy. Linear subsegmental atelectasis or scarring in the right middle and lower lobes, and inferior lingula. Review of the MIP images confirms the above findings. ABDOMEN AND PELVIS Arterial findings: Aorta: Scattered calcified plaque. Focal penetrating atheromatous ulcer or limited nonocclusive dissection in the infrarenal segment. No aneurysm or stenosis. Celiac axis:         Patent Superior mesenteric: Patent, with replaced right hepatic arterial supply, an anatomic variant. Left renal:          Duplicated, superior dominant, both patent. Right renal:         Single, patent. Inferior mesenteric: Patent, with origin stenosis related to aortic wall plaque. Left iliac: Eccentric calcified nonocclusive plaque through the common and internal iliac arteries. External iliac widely patent. Right iliac: Scattered eccentric calcified plaque through the common iliac into the proximal internal iliac artery. External iliac widely patent. Venous findings: Dedicated venous phase imaging not obtained. Note made of patent portal, splenic and renal veins. Review of the MIP images confirms the above findings. Nonvascular findings: Scattered  freed intraperitoneal air. Mild perihepatic and perisplenic ascites. Stomach is incompletely distended, with some wall thickening suggested in the body and antrum. There is some focal gas collection contiguous with the mucosa of the gastric antrum suggesting possible gastric ulceration as the source of free intraperitoneal gas and fluid. Small bowel and colon are nondilated. Appendix not identified. No significant diverticular disease. Unremarkable liver, gallbladder, spleen and  accessory splenules, adrenal glands, kidneys, pancreas. Urinary bladder is nondistended. Lumbar spine and bony pelvis unremarkable. IMPRESSION: 1. Free intraperitoneal air and fluid in the upper abdomen consistent with perforated viscus. Apparent mucosal defect in the gastric antrum and associated gas collection suggest perforated gastric ulcer. Critical Value/emergent results were called by telephone at the time of interpretation on 12/30/2014 at 5:09 pm to Dr. Gladstone Pih , who verbally acknowledged these results. 2. Negative for acute PE or thoracic aortic dissection. 3. Linear scarring or atelectasis in the lung bases right greater than left. 4. Age-advanced aortoiliac atheromatous plaque. Electronically Signed   By: Corlis Leak M.D.   On: 12/30/2014 17:09    EKG: Orders placed or performed in visit on 04/23/11  . EKG 12-Lead    IMPRESSION AND PLAN:  *  perforated gastric ulcer status post surgery 12/31/14.  Management per primary team , she is on antibiotic.   DVT prophylaxis.  Dilaudid for pain.  * Tachycardia  It is sinus tachycardia, and most likely driven by her severe pain.  I would suggest continue pain control with Dilaudid and give her some IV fluids.  *  UTI   she is on Zosyn for her intra-abdominal surgery.  *  Anemia  Slight drop in hemoglobin after surgery, continue monitoring.  * Gastric ulcer disease  With chronic use of pain medication and Goody powders.  I will give Protonix IV twice a day, and advised to follow in GI clinic after a month.  * Depression and bipolar disorders  Hold all oral medications at this point.  All the records are reviewed and case discussed with ED provider. Management plans discussed with the patient, family and they are in agreement.  CODE STATUS:    Code Status Orders        Start     Ordered   12/31/14 0226  Full code   Continuous     12/31/14 0226       TOTAL TIME TAKING CARE OF THIS PATIENT: 45 minutes.  Patient's  son present in the room.  Altamese Dilling M.D on 01/01/2015   Between 7am to 6pm - Pager - [phone]  After 6pm go to [email or web address] - password [email or web address]  [facility]  Office  [phone]  CC: Primary care physician; Pcp Not In System   Note: This dictation was prepared with Dragon dictation along with smaller phrase technology. Any transcriptional errors that result from this process are unintentional.

## 2015-01-01 NOTE — Consult Note (Signed)
Follow up CXR shows progressive B/L infiltrates Will give lasix and assess resp status in next 24 hrs. Will obtain ECHO to assess LV function

## 2015-01-01 NOTE — Progress Notes (Addendum)
[facility] [place] at [facility]   PATIENT NAME: Brandy Robinson   MR#: [id number]  DATE OF BIRTH: Nov 28, 1965  DATE OF ADMISSION: 12/30/2014  PRIMARY CARE PHYSICIAN: Pcp Not In System   REQUESTING/REFERRING PHYSICIAN: Dr. Zada Finders Reason for consult- Tachycardia.  CHIEF COMPLAINT:  Chief Complaint  Patient presents with  . Abdominal Pain    HISTORY OF PRESENT ILLNESS: Brandy Robinson is a 49 y.o. female with a known history of depression, anxiety, gastric reflux, insomnia, bipolar disorder- takes ibuprofen and Goody powders at home for her pain. He came to emergency room in night with severe abdominal pain and found having free air in peritoneum, taken to the OR by surgical team for perforated gastric ulcer, and postsurgery admitted in stepdown unit. She remained tachycardiac and could not be given any oral medications so medical consult was called in.   Has worsening respi status today- on HFNC, and drowsy.  PAST MEDICAL HISTORY:  Past Medical History  Diagnosis Date  . Wears glasses   . Depression   . Anxiety   . GERD (gastroesophageal reflux disease)   . Vaginal atrophy   . Lichen   . Surgical menopause   . Endometriosis   . Dyspareunia   . Insomnia   . Bipolar disorder (HCC)     PAST SURGICAL HISTORY:  Past Surgical History  Procedure Laterality Date  . Abdominal hysterectomy  1995    tah-bso  . Arm wound repair / closure  2013    cellulitis rt arm-i/d  . Knee bursectomy Left 09/24/2013    Procedure: IRRIGATION AND DEBRIDEMENT OF LEFT KNEE ; Surgeon: Sheral Apley, MD; Location: [facility]; Service: Orthopedics; Laterality: Left;    SOCIAL HISTORY:  Social History  Substance Use Topics  . Smoking status: Current Every Day Smoker -- 1.00 packs/day    Types: Cigarettes  . Smokeless tobacco: Not on file  . Alcohol Use: No     FAMILY HISTORY:  Family History  Problem Relation Age of Onset  . Diabetes Mother   . Diabetes Sister   . Cancer Neg Hx   . Heart disease Neg Hx     DRUG ALLERGIES:  Allergies  Allergen Reactions  . Hydrocodone Itching  . Aspirin Other (See Comments)    Reaction: GI upset   . Morphine And Related Itching, Nausea And Vomiting and Other (See Comments)    Reaction: Chest pain    REVIEW OF SYSTEMS:   CONSTITUTIONAL: No fever, fatigue or weakness.  EYES: No blurred or double vision.  EARS, NOSE, AND THROAT: No tinnitus or ear pain.  RESPIRATORY: No cough,severe  shortness of breath, no wheezing or hemoptysis.  CARDIOVASCULAR: No chest pain, orthopnea, edema.  GASTROINTESTINAL: No nausea, vomiting, diarrhea , s/p surgery - still have some abdominal pain.  GENITOURINARY: No dysuria, hematuria.  ENDOCRINE: No polyuria, nocturia,  HEMATOLOGY: No anemia, easy bruising or bleeding SKIN: No rash or lesion. MUSCULOSKELETAL: No joint pain or arthritis.  NEUROLOGIC: No tingling, numbness, weakness.  PSYCHIATRY: No anxiety or depression.   MEDICATIONS AT HOME:  Prior to Admission medications   Medication Sig Start Date End Date Taking? Authorizing Provider  buPROPion (WELLBUTRIN SR) 150 MG 12 hr tablet Take 150 mg by mouth daily.   Yes Historical Provider, MD  busPIRone (BUSPAR) 10 MG tablet Take 20 mg by mouth 3 (three) times daily.    Yes Historical Provider, MD  citalopram (CELEXA) 20 MG tablet Take 20 mg by mouth at bedtime.  Yes Historical Provider, MD  clonazePAM (KLONOPIN) 1 MG tablet Take 1 mg by mouth at bedtime.   Yes Historical Provider, MD  conjugated estrogens (PREMARIN) vaginal cream Place 0.5 Applicatorfuls vaginally every Monday, Wednesday, and Friday.   Yes Historical Provider, MD  estradiol (ESTRACE) 1 MG tablet Take 1 tablet (1 mg total) by mouth daily. 09/24/14  Yes  Prentice Docker Defrancesco, MD  metoCLOPramide (REGLAN) 10 MG tablet Take 1 tablet (10 mg total) by mouth 3 (three) times daily with meals. 11/07/14 11/07/15 Yes Minna Antis, MD  mometasone (ELOCON) 0.1 % ointment Apply 1 application topically 2 (two) times a week.   Yes Historical Provider, MD  omeprazole (PRILOSEC) 20 MG capsule Take 20 mg by mouth 2 (two) times daily.    Yes Historical Provider, MD  QUEtiapine (SEROQUEL XR) 300 MG 24 hr tablet Take 300 mg by mouth at bedtime.   Yes Historical Provider, MD  QUEtiapine (SEROQUEL) 50 MG tablet Take 50 mg by mouth 2 (two) times daily.    Yes Historical Provider, MD  oxyCODONE-acetaminophen (ROXICET) 5-325 MG tablet Take 1 tablet by mouth every 6 (six) hours as needed. Patient not taking: Reported on 12/30/2014 11/07/14   Minna Antis, MD     PHYSICAL EXAMINATION:   VITAL SIGNS: Blood pressure 106/55, pulse 121, temperature 99 F (37.2 C), temperature source Axillary, resp. rate 19, height  (1.6 m), weight 75.9 kg (167 lb 5.3 oz), SpO2 91 %.  GENERAL: 49 y.o.-year-old patient lying in the bed with acute respi distress .  EYES: Pupils equal, round, reactive to light and accommodation. No scleral icterus.  HEENT: Head atraumatic, normocephalic. Oropharynx and nasopharynx clear.  NECK: Supple, no jugular venous distention. No thyroid enlargement, no tenderness.  LUNGS: decreased breath sounds bilaterally, no wheezing, coarse bilateral crepitation. positive use of accessory muscles of respiration. on HFNC. CARDIOVASCULAR: S1, S2 normal- with tachycardia. No murmurs.  ABDOMEN: Soft, tender, nondistended. Bowel sounds present. No organomegaly or mass. surgical dressing present.  EXTREMITIES: No pedal edema, cyanosis, or clubbing.  NEUROLOGIC: Cranial nerves II through XII are intact. Muscle strength 5/5 in all extremities. Sensation intact. Gait not checked.  PSYCHIATRIC: The patient is  Drowsy but arousable, in distress.  SKIN: No obvious rash, lesion, or ulcer.   LABORATORY PANEL:   CBC  Last Labs      Recent Labs Lab 12/30/14 1444 12/31/14 0659 01/01/15 0412  WBC 13.5* 8.0 12.4*  HGB 10.0* 8.2* 8.3*  HCT 30.9* 25.1* 25.9*  PLT 393 329 351  MCV 90.2 91.6 91.9  MCH 29.3 29.8 29.6  MCHC 32.5 32.5 32.2  RDW 14.8* 14.8* 15.0*  LYMPHSABS 1.0 --  --   MONOABS 0.8 --  --   EOSABS 0.0 --  --   BASOSABS 0.0 --  --      ------------------------------------------------------------------------------------------------------------------  Chemistries   Last Labs      Recent Labs Lab 12/30/14 1444 12/31/14 0659 01/01/15 0412  NA 132* 133* 134*  K 4.0 4.7 4.6  CL 97* 107 104  CO2 20* 19* 25  GLUCOSE 99 81 75  BUN 24* 25* 20  CREATININE 1.07* 1.12* 0.83  CALCIUM 8.7* 7.3* 7.5*  AST 12* --  --   ALT 10* --  --   ALKPHOS 131* --  --   BILITOT 0.3 --  --      ------------------------------------------------------------------------------------------------------------------ estimated creatinine clearance is 81.7 mL/min (by C-G formula based on Cr of 0.83). ------------------------------------------------------------------------------------------------------------------  Recent Labs (last 2 labs)  No results for input(s): TSH, T4TOTAL, T3FREE, THYROIDAB in the last 72 hours.  Invalid input(s): FREET3     Coagulation profile  Last Labs      Recent Labs Lab 12/31/14 0659  INR 1.47     -------------------------------------------------------------------------------------------------------------------  Recent Labs (last 2 labs)     No results for input(s): DDIMER in the last 72 hours.   -------------------------------------------------------------------------------------------------------------------  Cardiac Enzymes  Last  Labs      Recent Labs Lab 12/30/14 1444  TROPONINI <0.03     ------------------------------------------------------------------------------------------------------------------  Last Labs     Invalid input(s): POCBNP    ---------------------------------------------------------------------------------------------------------------  Urinalysis  Labs (Brief)       Component Value Date/Time   COLORURINE YELLOW* 12/30/2014 1945   COLORURINE Straw 05/15/2014 2120   APPEARANCEUR CLEAR* 12/30/2014 1945   APPEARANCEUR Clear 05/15/2014 2120   LABSPEC >1.060* 12/30/2014 1945   LABSPEC 1.006 05/15/2014 2120   PHURINE 5.0 12/30/2014 1945   PHURINE 5.0 05/15/2014 2120   GLUCOSEU NEGATIVE 12/30/2014 1945   GLUCOSEU Negative 05/15/2014 2120   HGBUR NEGATIVE 12/30/2014 1945   HGBUR Negative 05/15/2014 2120   BILIRUBINUR NEGATIVE 12/30/2014 1945   BILIRUBINUR Negative 05/15/2014 2120   KETONESUR NEGATIVE 12/30/2014 1945   KETONESUR Negative 05/15/2014 2120   PROTEINUR NEGATIVE 12/30/2014 1945   PROTEINUR Negative 05/15/2014 2120   NITRITE NEGATIVE 12/30/2014 1945   NITRITE Negative 05/15/2014 2120   LEUKOCYTESUR 1+* 12/30/2014 1945   LEUKOCYTESUR Trace 05/15/2014 2120       RADIOLOGY:  Imaging Results (Last 48 hours)    Dg Chest 1 View  12/30/2014 CLINICAL DATA: Abdominal pain, nausea and vomiting beginning last night. Initial encounter. EXAM: CHEST 1 VIEW COMPARISON: PA and lateral chest 08/19/2011 and 03/22/2008. FINDINGS: Lung volumes are low with basilar atelectasis. No pneumothorax or pleural effusion. Heart size is normal. IMPRESSION: Low lung volumes with bibasilar atelectasis. No acute abnormality. Electronically Signed By: Drusilla Kanner M.D. On: 12/30/2014 15:40   Ct Angio Chest Aorta W/cm &/or Wo/cm  12/30/2014 CLINICAL DATA: Patient brought in by [facility] c/o  hypotension, chest pain, abdominal pain, nausea, and vomiting that started last night around 10 pm. Patient denies diarrhea. EXAM: CT ANGIOGRAPHY CHEST, ABDOMEN AND PELVIS TECHNIQUE: Multidetector CT imaging through the chest, abdomen and pelvis was performed using the standard protocol during bolus administration of intravenous contrast. Multiplanar reconstructed images and MIPs were obtained and reviewed to evaluate the vascular anatomy. CONTRAST: OMNIPAQUE IOHEXOL 350 MG/ML SOLN COMPARISON: 05/04/2014 FINDINGS: CHEST The noncontrast scout shows no hyperdense crescent, mediastinal hematoma, pleural or pericardial effusion. Scattered aortic arch calcifications. Right arm IV contrast injection. The SVC is patent. RV is nondilated. Satisfactory opacification of pulmonary arteries noted, and there is no evidence of pulmonary emboli. Patent pulmonary veins. Adequate contrast opacification of the thoracic aorta with no evidence of dissection, aneurysm, or stenosis. There is classic 3-vessel brachiocephalic arch anatomy without proximal stenosis. Calcified right hilar and subcarinal lymph nodes. No mediastinal adenopathy. Linear subsegmental atelectasis or scarring in the right middle and lower lobes, and inferior lingula. Review of the MIP images confirms the above findings. ABDOMEN AND PELVIS Arterial findings: Aorta: Scattered calcified plaque. Focal penetrating atheromatous ulcer or limited nonocclusive dissection in the infrarenal segment. No aneurysm or stenosis. Celiac axis: Patent Superior mesenteric: Patent, with replaced right hepatic arterial supply, an anatomic variant. Left renal: Duplicated, superior dominant, both patent. Right renal: Single, patent. Inferior mesenteric: Patent, with origin stenosis related to aortic wall plaque. Left iliac: Eccentric calcified nonocclusive plaque through  the common and internal iliac arteries. External iliac widely patent. Right iliac:  Scattered eccentric calcified plaque through the common iliac into the proximal internal iliac artery. External iliac widely patent. Venous findings: Dedicated venous phase imaging not obtained. Note made of patent portal, splenic and renal veins. Review of the MIP images confirms the above findings. Nonvascular findings: Scattered freed intraperitoneal air. Mild perihepatic and perisplenic ascites. Stomach is incompletely distended, with some wall thickening suggested in the body and antrum. There is some focal gas collection contiguous with the mucosa of the gastric antrum suggesting possible gastric ulceration as the source of free intraperitoneal gas and fluid. Small bowel and colon are nondilated. Appendix not identified. No significant diverticular disease. Unremarkable liver, gallbladder, spleen and accessory splenules, adrenal glands, kidneys, pancreas. Urinary bladder is nondistended. Lumbar spine and bony pelvis unremarkable. IMPRESSION: 1. Free intraperitoneal air and fluid in the upper abdomen consistent with perforated viscus. Apparent mucosal defect in the gastric antrum and associated gas collection suggest perforated gastric ulcer. Critical Value/emergent results were called by telephone at the time of interpretation on 12/30/2014 at 5:09 pm to Dr. Gladstone PihAVID SCHAEVITZ , who verbally acknowledged these results. 2. Negative for acute PE or thoracic aortic dissection. 3. Linear scarring or atelectasis in the lung bases right greater than left. 4. Age-advanced aortoiliac atheromatous plaque. Electronically Signed By: Corlis Leak Hassell M.D. On: 12/30/2014 17:09   Ct Angio Abd/pel W/ And/or W/o  12/30/2014 CLINICAL DATA: Patient brought in by [facility] c/o hypotension, chest pain, abdominal pain, nausea, and vomiting that started last night around 10 pm. Patient denies diarrhea. EXAM: CT ANGIOGRAPHY CHEST, ABDOMEN AND PELVIS TECHNIQUE: Multidetector CT imaging through the chest, abdomen and pelvis was performed  using the standard protocol during bolus administration of intravenous contrast. Multiplanar reconstructed images and MIPs were obtained and reviewed to evaluate the vascular anatomy. CONTRAST: 125mL OMNIPAQUE IOHEXOL 350 MG/ML SOLN COMPARISON: 05/04/2014 FINDINGS: CHEST The noncontrast scout shows no hyperdense crescent, mediastinal hematoma, pleural or pericardial effusion. Scattered aortic arch calcifications. Right arm IV contrast injection. The SVC is patent. RV is nondilated. Satisfactory opacification of pulmonary arteries noted, and there is no evidence of pulmonary emboli. Patent pulmonary veins. Adequate contrast opacification of the thoracic aorta with no evidence of dissection, aneurysm, or stenosis. There is classic 3-vessel brachiocephalic arch anatomy without proximal stenosis. Calcified right hilar and subcarinal lymph nodes. No mediastinal adenopathy. Linear subsegmental atelectasis or scarring in the right middle and lower lobes, and inferior lingula. Review of the MIP images confirms the above findings. ABDOMEN AND PELVIS Arterial findings: Aorta: Scattered calcified plaque. Focal penetrating atheromatous ulcer or limited nonocclusive dissection in the infrarenal segment. No aneurysm or stenosis. Celiac axis: Patent Superior mesenteric: Patent, with replaced right hepatic arterial supply, an anatomic variant. Left renal: Duplicated, superior dominant, both patent. Right renal: Single, patent. Inferior mesenteric: Patent, with origin stenosis related to aortic wall plaque. Left iliac: Eccentric calcified nonocclusive plaque through the common and internal iliac arteries. External iliac widely patent. Right iliac: Scattered eccentric calcified plaque through the common iliac into the proximal internal iliac artery. External iliac widely patent. Venous findings: Dedicated venous phase imaging not obtained. Note made of patent portal, splenic and renal veins. Review of the  MIP images confirms the above findings. Nonvascular findings: Scattered freed intraperitoneal air. Mild perihepatic and perisplenic ascites. Stomach is incompletely distended, with some wall thickening suggested in the body and antrum. There is some focal gas collection contiguous with the mucosa of the gastric antrum suggesting possible gastric  ulceration as the source of free intraperitoneal gas and fluid. Small bowel and colon are nondilated. Appendix not identified. No significant diverticular disease. Unremarkable liver, gallbladder, spleen and accessory splenules, adrenal glands, kidneys, pancreas. Urinary bladder is nondistended. Lumbar spine and bony pelvis unremarkable. IMPRESSION: 1. Free intraperitoneal air and fluid in the upper abdomen consistent with perforated viscus. Apparent mucosal defect in the gastric antrum and associated gas collection suggest perforated gastric ulcer. Critical Value/emergent results were called by telephone at the time of interpretation on 12/30/2014 at 5:09 pm to Dr. Gladstone Pih , who verbally acknowledged these results. 2. Negative for acute PE or thoracic aortic dissection. 3. Linear scarring or atelectasis in the lung bases right greater than left. 4. Age-advanced aortoiliac atheromatous plaque. Electronically Signed By: Corlis Leak M.D. On: 12/30/2014 17:09     EKG: Orders placed or performed in visit on 04/23/11  . EKG 12-Lead    IMPRESSION AND PLAN:  * perforated gastric ulcer status post surgery 12/31/14. Management per primary team , she is on antibiotic.  DVT prophylaxis. Dilaudid for pain.  * Ac respi failure- hypoxic   X ray shows b/l Infiltrates Vs - edema   IV lasix and Echo.    She was also a chronic smoker.    GIve nebs.    On HFNC.  * Tachycardia It is sinus tachycardia, and most likely driven by her severe pain. I would suggest continue pain control with Dilaudid and give her some IV fluids.  * UTI  she is on  Zosyn for her intra-abdominal surgery.   * Anemia Slight drop in hemoglobin after surgery, continue monitoring.  * Gastric ulcer disease With chronic use of pain medication and Goody powders. I will give Protonix IV twice a day, and advised to follow in GI clinic after a month.  * Depression and bipolar disorders Hold all oral medications at this point.  All the records are reviewed and case discussed with ED provider. Management plans discussed with the patient, family and they are in agreement. Condition is critical due to respi failure.  CODE STATUS:    Code Status Orders        Start   Ordered   12/31/14 0226  Full code Continuous    12/31/14 0226       TOTAL TIME TAKING CARE OF THIS PATIENT: 45 Critical care minutes.  Patient's son present in the room.  Altamese Dilling M.D on 01/01/2015   Between 7am to 6pm - Pager - [phone]  After 6pm go to [email or web address] - password [email or web address]  [facility]  Office [phone]  CC: Primary care physician; Pcp Not In System   Note: This dictation was prepared with Dragon dictation along with smaller phrase technology. Any transcriptional errors that result from this process are unintentional.

## 2015-01-01 NOTE — Progress Notes (Signed)
2 Days Post-Op   Subjective:  Patient much more alert this evening. States her pain is better controlled but still there. Again states desire to drink and that she is thirsty. Breathing and tachycardia are much improved. NG tube with minimal output  Vital signs in last 24 hours: Temp:  [97.9 F (36.6 C)-100.1 F (37.8 C)] 100.1 F (37.8 C) (12/15 1600) Pulse Rate:  [115-126] 116 (12/15 1800) Resp:  [15-28] 28 (12/15 1800) BP: (97-128)/(49-99) 128/64 mmHg (12/15 1800) SpO2:  [86 %-97 %] 95 % (12/15 1800) FiO2 (%):  [50 %-88.6 %] 65 % (12/15 1500) Weight:  [75.9 kg (167 lb 5.3 oz)] 75.9 kg (167 lb 5.3 oz) (12/15 0419) Last BM Date: 12/30/14  Intake/Output from previous day: 12/14 0701 - 12/15 0700 In: 1970 [I.V.:1750; IV Piggyback:100] Out: 2100 [Urine:1875; Emesis/NG output:20; Drains:205]  GI: Appropriately tender to palpation, midline incision with dressing in place without obvious drainage or erythema, multiple drains in place. 2 drains in the right abdomen and one drain in the left. All drains draining serosanguineous fluid.  Lab Results:  CBC  Recent Labs  12/31/14 0659 01/01/15 0412  WBC 8.0 12.4*  HGB 8.2* 8.3*  HCT 25.1* 25.9*  PLT 329 351   CMP     Component Value Date/Time   NA 134* 01/01/2015 0412   NA 139 05/15/2014 2120   K 4.6 01/01/2015 0412   K 4.0 05/15/2014 2120   CL 104 01/01/2015 0412   CL 110 05/15/2014 2120   CO2 25 01/01/2015 0412   CO2 22 05/15/2014 2120   GLUCOSE 75 01/01/2015 0412   GLUCOSE 88 05/15/2014 2120   BUN 20 01/01/2015 0412   BUN 10 05/15/2014 2120   CREATININE 0.83 01/01/2015 0412   CREATININE 0.78 05/15/2014 2120   CALCIUM 7.5* 01/01/2015 0412   CALCIUM 9.3 05/15/2014 2120   PROT 6.3* 12/30/2014 1444   PROT 7.2 05/15/2014 2120   ALBUMIN 2.6* 12/30/2014 1444   ALBUMIN 3.7 05/15/2014 2120   AST 12* 12/30/2014 1444   AST 17 05/15/2014 2120   ALT 10* 12/30/2014 1444   ALT 11* 05/15/2014 2120   ALKPHOS 131* 12/30/2014  1444   ALKPHOS 103 05/15/2014 2120   BILITOT 0.3 12/30/2014 1444   BILITOT 0.4 05/15/2014 2120   GFRNONAA >60 01/01/2015 0412   GFRNONAA >60 05/15/2014 2120   GFRNONAA >60 04/25/2011 0755   GFRAA >60 01/01/2015 0412   GFRAA >60 05/15/2014 2120   GFRAA >60 04/25/2011 0755   PT/INR  Recent Labs  12/31/14 0659  LABPROT 17.9*  INR 1.47    Studies/Results: Dg Chest Port 1 View  01/01/2015  CLINICAL DATA:  Acute respiratory failure. EXAM: PORTABLE CHEST 1 VIEW COMPARISON:  12/30/2014; CT the chest, abdomen pelvis - 01/01/2015 FINDINGS: Interval development of extensive bilateral perihilar predominant heterogeneous airspace opacities with relative sparing of the lung periphery and obscuration of the cardiac silhouette and mediastinal contours. Suspected development of small bilateral effusions, left greater than right. Enteric tube terminates below the left hemidiaphragm. No acute osseous abnormalities. IMPRESSION: Findings worrisome for flash alveolar pulmonary edema though note, extensive aspiration could have a similar appearance. Continued attention on follow-up is recommended. Electronically Signed   By: Simonne Come M.D.   On: 01/01/2015 13:54    Assessment/Plan: 49 year old female postop day #2 following exploratory laparotomy with Cheree Ditto patch repair of perforated gastric ulcer. Discussed with patient and family members to need for continued NG tube decompression for at least 3 days. After which, so  long as JP drain output remains unchanged plan to remove NG tube after the third day. Progression of diet follows very slowly after this to ensure continued integrity of the repair.  Appreciate critical care, internal medicine and pharmacy assistance with this complicated patient.   Ricarda Frameharles Embree Brawley, MD FACS General Surgeon  01/01/2015

## 2015-01-01 NOTE — Progress Notes (Signed)
ANTIBIOTIC CONSULT NOTE - INITIAL  Pharmacy Consult for Zosyn dosing Indication: intra-abdominal infection  Allergies  Allergen Reactions  . Hydrocodone Itching  . Aspirin Other (See Comments)    Reaction:  GI upset   . Morphine And Related Itching, Nausea And Vomiting and Other (See Comments)    Reaction:  Chest pain    Patient Measurements: Height: 5\' 3"  (160 cm) Weight: 167 lb 5.3 oz (75.9 kg) IBW/kg (Calculated) : 52.4 Adjusted Body Weight: n/a  Vital Signs: Temp: 99 F (37.2 C) (12/15 0800) Temp Source: Axillary (12/15 0800) BP: 123/63 mmHg (12/15 1200) Pulse Rate: 125 (12/15 1200) Intake/Output from previous day: 12/14 0701 - 12/15 0700 In: 1970 [I.V.:1750; IV Piggyback:100] Out: 2100 [Urine:1875; Emesis/NG output:20; Drains:205] Intake/Output from this shift: Total I/O In: 60 [Other:60] Out: 900 [Urine:900]  Labs:  Recent Labs  12/30/14 1444 12/31/14 0659 01/01/15 0412  WBC 13.5* 8.0 12.4*  HGB 10.0* 8.2* 8.3*  PLT 393 329 351  CREATININE 1.07* 1.12* 0.83   Estimated Creatinine Clearance: 81.7 mL/min (by C-G formula based on Cr of 0.83). No results for input(s): VANCOTROUGH, VANCOPEAK, VANCORANDOM, GENTTROUGH, GENTPEAK, GENTRANDOM, TOBRATROUGH, TOBRAPEAK, TOBRARND, AMIKACINPEAK, AMIKACINTROU, AMIKACIN in the last 72 hours.   Microbiology: Recent Results (from the past 720 hour(s))  Blood Culture (routine x 2)     Status: None (Preliminary result)   Collection Time: 12/30/14  5:37 PM  Result Value Ref Range Status   Specimen Description BLOOD RIGHT ASSIST CONTROL  Final   Special Requests   Final    BOTTLES DRAWN AEROBIC AND ANAEROBIC  3CC AERO, 2 ANAERO   Culture NO GROWTH 2 DAYS  Final   Report Status PENDING  Incomplete  Blood Culture (routine x 2)     Status: None (Preliminary result)   Collection Time: 12/30/14  5:40 PM  Result Value Ref Range Status   Specimen Description BLOOD LEFT HAND  Final   Special Requests   Final    BOTTLES DRAWN  AEROBIC AND ANAEROBIC  3CC AERO, 2CC ANAERO   Culture NO GROWTH 2 DAYS  Final   Report Status PENDING  Incomplete  Urine culture     Status: None   Collection Time: 12/30/14  7:45 PM  Result Value Ref Range Status   Specimen Description URINE, RANDOM  Final   Special Requests NONE  Final   Culture NO GROWTH  Final   Report Status 01/01/2015 FINAL  Final  MRSA PCR Screening     Status: None   Collection Time: 12/31/14 12:24 PM  Result Value Ref Range Status   MRSA by PCR NEGATIVE NEGATIVE Final    Comment:        The GeneXpert MRSA Assay (FDA approved for NASAL specimens only), is one component of a comprehensive MRSA colonization surveillance program. It is not intended to diagnose MRSA infection nor to guide or monitor treatment for MRSA infections.     Medical History: Past Medical History  Diagnosis Date  . Wears glasses   . Depression   . Anxiety   . GERD (gastroesophageal reflux disease)   . Vaginal atrophy   . Lichen   . Surgical menopause   . Endometriosis   . Dyspareunia   . Insomnia   . Bipolar disorder (HCC)     Medications:   Assessment: 49 y/o F s/p exploratory laparotomy for perforated viscus.   Plan:  Will continue Zosyn 3.375 g EI q 8 hours.   Luisa Harthristy, Luisenrique Conran D 01/01/2015,1:29 PM

## 2015-01-01 NOTE — Consult Note (Signed)
[facility] Consult[facility]     Date: 01/01/2015,   MRN# [id number] Brandy Robinson Dec 03, 1965 Code Status:     Code Status Orders        Start     Ordered   12/31/14 0226  Full code   Continuous     12/31/14 0226     Hosp day:@LENGTHOFSTAYDAYS @ Referring MD: @ATDPROV @     PCP:      AdmissionWeight: 159 lb 6.3 oz (72.3 kg)                 CurrentWeight: 167 lb 5.3 oz (75.9 kg) Brandy Robinson is a 49 y.o. old female seen in consultation for elevated HR.request of Dr. Tonita CongWoodham.   EVENTS OVER NIGHT  Tra Transferred to ICU for elevated HR, patient s/p open lap fro perforated viscus Patient with NG in place with abd distention, in abd pain, not passing gas, Ng to low wall suction Patient placed on high flow [place] 80%, given dilaudid for abd pain, resting comfortably, somnelent from pain meds   Home Medication:  No current outpatient prescriptions on file.  Current Medication:   Current facility-administered medications:  .  antiseptic oral rinse (CPC / CETYLPYRIDINIUM CHLORIDE 0.05%) solution 7 mL, 7 mL, Mouth Rinse, BID, Ricarda Frameharles Woodham, MD, 7 mL at 01/01/15 1000 .  dextrose 5 % and 0.45 % NaCl with KCl 20 mEq/L infusion, , Intravenous, Continuous, Ricarda Frameharles Woodham, MD, Last Rate: 125 mL/hr at 01/01/15 0745 .  diazepam (VALIUM) injection 5 mg, 5 mg, Intravenous, Q4H PRN, Ricarda Frameharles Woodham, MD, 5 mg at 12/31/14 1806 .  diphenhydrAMINE (BENADRYL) capsule 25 mg, 25 mg, Oral, Q6H PRN **OR** diphenhydrAMINE (BENADRYL) injection 25 mg, 25 mg, Intravenous, Q6H PRN, Ricarda Frameharles Woodham, MD .  enoxaparin (LOVENOX) injection 40 mg, 40 mg, Subcutaneous, Q24H, Ricarda Frameharles Woodham, MD, 40 mg at 01/01/15 [id number] .  hydrALAZINE (APRESOLINE) injection 10 mg, 10 mg, Intravenous, Q2H PRN, Ricarda Frameharles Woodham, MD .  HYDROmorphone (DILAUDID) injection 2-4 mg, 2-4 mg, Intravenous, Q2H PRN, Erin FullingKurian Brittin Janik, MD, 2 mg at 01/01/15 0737 .  HYDROmorphone (DILAUDID) injection 4 mg, 4 mg, Intravenous, Once,  Erin FullingKurian Rodolphe Edmonston, MD, 4 mg at 12/31/14 0901 .  ondansetron (ZOFRAN-ODT) disintegrating tablet 4 mg, 4 mg, Oral, Q6H PRN **OR** ondansetron (ZOFRAN) injection 4 mg, 4 mg, Intravenous, Q6H PRN, Ricarda Frameharles Woodham, MD .  pantoprazole (PROTONIX) injection 40 mg, 40 mg, Intravenous, Q12H, Ricarda Frameharles Woodham, MD, 40 mg at 01/01/15 0931 .  piperacillin-tazobactam (ZOSYN) IVPB 3.375 g, 3.375 g, Intravenous, 3 times per day, Myrna Blazeravid Matthew Schaevitz, MD, 3.375 g at 01/01/15 [id number]     ALLERGIES   Hydrocodone; Aspirin; and Morphine and related     REVIEW OF SYSTEMS   Review of Systems  Constitutional: Positive for malaise/fatigue. Negative for fever and chills.  Eyes: Negative for blurred vision.  Respiratory: Positive for shortness of breath. Negative for cough.   Cardiovascular: Negative for chest pain and leg swelling.  Gastrointestinal: Positive for abdominal pain. Negative for nausea.  Skin: Negative for rash.  Neurological: Negative for dizziness and headaches.  Psychiatric/Behavioral: The patient is nervous/anxious.      VS: BP 112/56 mmHg  Pulse 121  Temp(Src) 99 F (37.2 C) (Axillary)  Resp 21  Ht 5\' 3"  (1.6 m)  Wt 167 lb 5.3 oz (75.9 kg)  BMI 29.65 kg/m2  SpO2 94%     PHYSICAL EXAM   Physical Exam  Constitutional: She appears distressed.  HENT:  Head: Normocephalic and atraumatic.  Eyes: Conjunctivae are  normal. Pupils are equal, round, and reactive to light.  Neck: Normal range of motion. Neck supple.  Pulmonary/Chest: Effort normal and breath sounds normal. No respiratory distress.  Abdominal: She exhibits distension. There is tenderness.  Incision intact, bandages in place  Musculoskeletal: She exhibits no edema.  Neurological: She is alert. No cranial nerve deficit.  Skin: Skin is warm. She is diaphoretic.        LABS    Recent Labs     12/30/14  1444  12/31/14  0659  01/01/15  0412  HGB  10.0*  8.2*  8.3*  HCT  30.9*  25.1*  25.9*  MCV  90.2  91.6  91.9    WBC  13.5*  8.0  12.4*  BUN  24*  25*  20  CREATININE  1.07*  1.12*  0.83  GLUCOSE  99  81  75  CALCIUM  8.7*  7.3*  7.5*  INR   --   1.47   --   ,    CULTURE RESULTS   Recent Results (from the past 240 hour(s))  Blood Culture (routine x 2)     Status: None (Preliminary result)   Collection Time: 12/30/14  5:37 PM  Result Value Ref Range Status   Specimen Description BLOOD RIGHT ASSIST CONTROL  Final   Special Requests   Final    BOTTLES DRAWN AEROBIC AND ANAEROBIC  3CC AERO, 2 ANAERO   Culture NO GROWTH 2 DAYS  Final   Report Status PENDING  Incomplete  Blood Culture (routine x 2)     Status: None (Preliminary result)   Collection Time: 12/30/14  5:40 PM  Result Value Ref Range Status   Specimen Description BLOOD LEFT HAND  Final   Special Requests   Final    BOTTLES DRAWN AEROBIC AND ANAEROBIC  3CC AERO, 2CC ANAERO   Culture NO GROWTH 2 DAYS  Final   Report Status PENDING  Incomplete  Urine culture     Status: None   Collection Time: 12/30/14  7:45 PM  Result Value Ref Range Status   Specimen Description URINE, RANDOM  Final   Special Requests NONE  Final   Culture NO GROWTH  Final   Report Status 01/01/2015 FINAL  Final  MRSA PCR Screening     Status: None   Collection Time: 12/31/14 12:24 PM  Result Value Ref Range Status   MRSA by PCR NEGATIVE NEGATIVE Final    Comment:        The GeneXpert MRSA Assay (FDA approved for NASAL specimens only), is one component of a comprehensive MRSA colonization surveillance program. It is not intended to diagnose MRSA infection nor to guide or monitor treatment for MRSA infections.             ASSESSMENT/PLAN   49 yo white female admitted to ICU for closer monitoring of BP/HR. likely cause of elevated HR and SOB likey from pain and abd distention with atelectasis, PE less likely at this time, will assess for Ct chest  1.oxygen as needed 2.incentive spirometry 3.pain meds as needed 4.NG to suction-follow up  surgery recs  Place in step down status.   I have personally obtained a history, examined the patient, evaluated laboratory and independently reviewed imaging results, formulated the assessment and plan and placed orders.  The Patient requires high complexity decision making for assessment and support, frequent evaluation and titration of therapies, application of advanced monitoring technologies and extensive interpretation of multiple databases.  Corrin Parker, M.D.  [facility]  Medical Director [place] Director [facility] Cardio-Pulmonary Department

## 2015-01-02 ENCOUNTER — Inpatient Hospital Stay: Payer: Self-pay

## 2015-01-02 DIAGNOSIS — J810 Acute pulmonary edema: Secondary | ICD-10-CM

## 2015-01-02 LAB — CBC
HCT: 24.5 % — ABNORMAL LOW (ref 35.0–47.0)
Hemoglobin: 7.9 g/dL — ABNORMAL LOW (ref 12.0–16.0)
MCH: 29.2 pg (ref 26.0–34.0)
MCHC: 32.2 g/dL (ref 32.0–36.0)
MCV: 90.5 fL (ref 80.0–100.0)
PLATELETS: 329 10*3/uL (ref 150–440)
RBC: 2.71 MIL/uL — ABNORMAL LOW (ref 3.80–5.20)
RDW: 14.7 % — AB (ref 11.5–14.5)
WBC: 14 10*3/uL — AB (ref 3.6–11.0)

## 2015-01-02 LAB — BASIC METABOLIC PANEL
ANION GAP: 9 (ref 5–15)
BUN: 10 mg/dL (ref 6–20)
CALCIUM: 7.9 mg/dL — AB (ref 8.9–10.3)
CO2: 34 mmol/L — ABNORMAL HIGH (ref 22–32)
Chloride: 95 mmol/L — ABNORMAL LOW (ref 101–111)
Creatinine, Ser: 0.68 mg/dL (ref 0.44–1.00)
Glucose, Bld: 131 mg/dL — ABNORMAL HIGH (ref 65–99)
Potassium: 3.7 mmol/L (ref 3.5–5.1)
SODIUM: 138 mmol/L (ref 135–145)

## 2015-01-02 LAB — PROTIME-INR
INR: 1.19
Prothrombin Time: 15.3 seconds — ABNORMAL HIGH (ref 11.4–15.0)

## 2015-01-02 MED ORDER — METHYLPREDNISOLONE SODIUM SUCC 125 MG IJ SOLR
60.0000 mg | Freq: Four times a day (QID) | INTRAMUSCULAR | Status: DC
  Administered 2015-01-02 – 2015-01-03 (×5): 60 mg via INTRAVENOUS
  Filled 2015-01-02 (×5): qty 2

## 2015-01-02 MED ORDER — ACETAZOLAMIDE SODIUM 500 MG IJ SOLR
250.0000 mg | Freq: Once | INTRAMUSCULAR | Status: AC
  Administered 2015-01-02: 250 mg via INTRAVENOUS
  Filled 2015-01-02: qty 500

## 2015-01-02 MED ORDER — IPRATROPIUM-ALBUTEROL 0.5-2.5 (3) MG/3ML IN SOLN
3.0000 mL | RESPIRATORY_TRACT | Status: DC
  Administered 2015-01-02 – 2015-01-04 (×11): 3 mL via RESPIRATORY_TRACT
  Filled 2015-01-02 (×13): qty 3

## 2015-01-02 MED ORDER — BUDESONIDE 0.5 MG/2ML IN SUSP
0.5000 mg | Freq: Two times a day (BID) | RESPIRATORY_TRACT | Status: DC
  Administered 2015-01-02 – 2015-01-10 (×15): 0.5 mg via RESPIRATORY_TRACT
  Filled 2015-01-02 (×16): qty 2

## 2015-01-02 NOTE — Progress Notes (Signed)
ANTIBIOTIC CONSULT NOTE - INITIAL  Pharmacy Consult for Zosyn dosing Indication: intra-abdominal infection  Allergies  Allergen Reactions  . Hydrocodone Itching  . Aspirin Other (See Comments)    Reaction:  GI upset   . Morphine And Related Itching, Nausea And Vomiting and Other (See Comments)    Reaction:  Chest pain    Patient Measurements: Height: 5\' 3"  (160 cm) Weight: 167 lb 5.3 oz (75.9 kg) IBW/kg (Calculated) : 52.4 Adjusted Body Weight: n/a  Vital Signs: Temp: 97.6 F (36.4 C) (12/16 1200) Temp Source: Axillary (12/16 1200) BP: 111/59 mmHg (12/16 1600) Pulse Rate: 100 (12/16 1600) Intake/Output from previous day: 12/15 0701 - 12/16 0700 In: 2240 [I.V.:1980; IV Piggyback:200] Out: 5125 [Urine:5075; Drains:50] Intake/Output from this shift: Total I/O In: 600 [I.V.:525; Other:25; IV Piggyback:50] Out: -   Labs:  Recent Labs  12/31/14 0659 01/01/15 0412 01/02/15 0501  WBC 8.0 12.4* 14.0*  HGB 8.2* 8.3* 7.9*  PLT 329 351 329  CREATININE 1.12* 0.83 0.68   Estimated Creatinine Clearance: 84.8 mL/min (by C-G formula based on Cr of 0.68). No results for input(s): VANCOTROUGH, VANCOPEAK, VANCORANDOM, GENTTROUGH, GENTPEAK, GENTRANDOM, TOBRATROUGH, TOBRAPEAK, TOBRARND, AMIKACINPEAK, AMIKACINTROU, AMIKACIN in the last 72 hours.   Microbiology: Recent Results (from the past 720 hour(s))  Blood Culture (routine x 2)     Status: None (Preliminary result)   Collection Time: 12/30/14  5:37 PM  Result Value Ref Range Status   Specimen Description BLOOD RIGHT ASSIST CONTROL  Final   Special Requests   Final    BOTTLES DRAWN AEROBIC AND ANAEROBIC  3CC AERO, 2 ANAERO   Culture NO GROWTH 3 DAYS  Final   Report Status PENDING  Incomplete  Blood Culture (routine x 2)     Status: None (Preliminary result)   Collection Time: 12/30/14  5:40 PM  Result Value Ref Range Status   Specimen Description BLOOD LEFT HAND  Final   Special Requests   Final    BOTTLES DRAWN AEROBIC  AND ANAEROBIC  3CC AERO, 2CC ANAERO   Culture NO GROWTH 3 DAYS  Final   Report Status PENDING  Incomplete  Urine culture     Status: None   Collection Time: 12/30/14  7:45 PM  Result Value Ref Range Status   Specimen Description URINE, RANDOM  Final   Special Requests NONE  Final   Culture NO GROWTH  Final   Report Status 01/01/2015 FINAL  Final  MRSA PCR Screening     Status: None   Collection Time: 12/31/14 12:24 PM  Result Value Ref Range Status   MRSA by PCR NEGATIVE NEGATIVE Final    Comment:        The GeneXpert MRSA Assay (FDA approved for NASAL specimens only), is one component of a comprehensive MRSA colonization surveillance program. It is not intended to diagnose MRSA infection nor to guide or monitor treatment for MRSA infections.     Medical History: Past Medical History  Diagnosis Date  . Wears glasses   . Depression   . Anxiety   . GERD (gastroesophageal reflux disease)   . Vaginal atrophy   . Lichen   . Surgical menopause   . Endometriosis   . Dyspareunia   . Insomnia   . Bipolar disorder (HCC)     Medications:   Assessment: 49 y/o F s/p exploratory laparotomy for perforated viscus.   Plan:  Will continue Zosyn 3.375 g EI q 8 hours.   Luisa Harthristy, Aza Dantes D 01/02/2015,4:32 PM

## 2015-01-02 NOTE — Clinical Documentation Improvement (Signed)
Internal Medicine  Can the diagnosis of systemic infection be further specified?   Sepsis - specify causative organism if known}  Other  Clinically Undetermined  Document any associated diagnoses/conditions.   Supporting Information: WBC 13.5 Final diagnoses:  Abdominal pain   Perforated viscus. Sepsis.        Please exercise your independent, professional judgment when responding. A specific answer is not anticipated or expected. Please update your documentation within the medical record to reflect your response to this query. Thank you  Thank Barrie DunkerYou,  Omer Monter C Cianna [facility]par[facility] [employer] [facility] [place] [phone]

## 2015-01-02 NOTE — Progress Notes (Signed)
[facility]   PATIENT NAME: Brandy PyleKimberly Robinson   MR#: [id number]  DATE OF BIRTH: 08-25-1965  DATE OF ADMISSION: 12/30/2014  PRIMARY CARE PHYSICIAN: Pcp Not In System   REQUESTING/REFERRING PHYSICIAN: Dr. Zada FindersWoodhard Reason for consult- Tachycardia.  CHIEF COMPLAINT:  Chief Complaint  Patient presents with  . Abdominal Pain    HISTORY OF PRESENT ILLNESS: Brandy Robinson is a 49 y.o. female with a known history of depression, anxiety, gastric reflux, insomnia, bipolar disorder- takes ibuprofen and Goody powders at home for her pain. He came to emergency room in night with severe abdominal pain and found having free air in peritoneum, taken to the OR by surgical team for perforated gastric ulcer, and postsurgery admitted in stepdown unit. She remained tachycardiac and could not be given any oral medications so medical consult was called in.   Has worsening respi status 01/01/15- on HFNC, and drowsy.   Little better today, still on HFNC but less oxygen.  PAST MEDICAL HISTORY:  Past Medical History  Diagnosis Date  . Wears glasses   . Depression   . Anxiety   . GERD (gastroesophageal reflux disease)   . Vaginal atrophy   . Lichen   . Surgical menopause   . Endometriosis   . Dyspareunia   . Insomnia   . Bipolar disorder (HCC)     PAST SURGICAL HISTORY:  Past Surgical History  Procedure Laterality Date  . Abdominal hysterectomy  1995    [facility]-bso  . Arm wound repair / closure  2013    cellulitis rt arm-i/d  . Knee bursectomy Left 09/24/2013    Procedure: IRRIGATION AND DEBRIDEMENT OF LEFT KNEE ; Surgeon: Sheral Apleyimothy D Murphy, MD; Location: [facility]; Service: Orthopedics; Laterality: Left;    SOCIAL HISTORY:  Social History  Substance Use Topics  . Smoking status: Current Every Day Smoker -- 1.00 packs/day    Types: Cigarettes   . Smokeless tobacco: Not on file  . Alcohol Use: No    FAMILY HISTORY:  Family History  Problem Relation Age of Onset  . Diabetes Mother   . Diabetes Sister   . Cancer Neg Hx   . Heart disease Neg Hx     DRUG ALLERGIES:  Allergies  Allergen Reactions  . Hydrocodone Itching  . Aspirin Other (See Comments)    Reaction: GI upset   . Morphine And Related Itching, Nausea And Vomiting and Other (See Comments)    Reaction: Chest pain    REVIEW OF SYSTEMS:   Pt is drowsy and confused with hypoxia. So not able to provide ROS.  MEDICATIONS AT HOME:  Prior to Admission medications   Medication Sig Start Date End Date Taking? Authorizing Provider  buPROPion (WELLBUTRIN SR) 150 MG 12 hr tablet Take 150 mg by mouth daily.   Yes Historical Provider, MD  busPIRone (BUSPAR) 10 MG tablet Take 20 mg by mouth 3 (three) times daily.    Yes Historical Provider, MD  citalopram (CELEXA) 20 MG tablet Take 20 mg by mouth at bedtime.    Yes Historical Provider, MD  clonazePAM (KLONOPIN) 1 MG tablet Take 1 mg by mouth at bedtime.   Yes Historical Provider, MD  conjugated estrogens (PREMARIN) vaginal cream Place 0.5 Applicatorfuls vaginally every Monday, Wednesday, and Friday.   Yes Historical Provider, MD  estradiol (ESTRACE) 1 MG tablet Take 1 tablet (1 mg total) by mouth daily. 09/24/14  Yes Prentice DockerMartin A Defrancesco, MD  metoCLOPramide (REGLAN) 10 MG tablet  Take 1 tablet (10 mg total) by mouth 3 (three) times daily with meals. 11/07/14 11/07/15 Yes Minna Antis, MD  mometasone (ELOCON) 0.1 % ointment Apply 1 application topically 2 (two) times a week.   Yes Historical Provider, MD  omeprazole (PRILOSEC) 20 MG capsule Take 20 mg by mouth 2 (two) times daily.    Yes Historical Provider, MD  QUEtiapine (SEROQUEL XR) 300 MG 24 hr tablet Take 300 mg by mouth at bedtime.   Yes Historical  Provider, MD  QUEtiapine (SEROQUEL) 50 MG tablet Take 50 mg by mouth 2 (two) times daily.    Yes Historical Provider, MD  oxyCODONE-acetaminophen (ROXICET) 5-325 MG tablet Take 1 tablet by mouth every 6 (six) hours as needed. Patient not taking: Reported on 12/30/2014 11/07/14   Minna Antis, MD     PHYSICAL EXAMINATION:   VITAL SIGNS: Blood pressure 106/55, pulse 121, temperature 99 F (37.2 C), temperature source Axillary, resp. rate 19, height 5\' 3"  (1.6 m), weight 75.9 kg (167 lb 5.3 oz), SpO2 91 %.  GENERAL: 49 y.o.-year-old patient lying in the bed with acute respi distress .  EYES: Pupils equal, round, reactive to light and accommodation. No scleral icterus.  HEENT: Head atraumatic, normocephalic. Oropharynx and nasopharynx clear.  NECK: Supple, no jugular venous distention. No thyroid enlargement, no tenderness.  LUNGS: decreased breath sounds bilaterally, no wheezing, coarse bilateral crepitation. positive use of accessory muscles of respiration. on HFNC. CARDIOVASCULAR: S1, S2 normal- with tachycardia. No murmurs.  ABDOMEN: Soft, tender, nondistended. Bowel sounds present. No organomegaly or mass. surgical dressing present.  EXTREMITIES: No pedal edema, cyanosis, or clubbing.  NEUROLOGIC: drowsy, moves limbs spontaneously. PSYCHIATRIC: The patient is Drowsy but arousable, in distress.  SKIN: No obvious rash, lesion, or ulcer.   LABORATORY PANEL:   CBC  Last Labs      Recent Labs Lab 12/30/14 1444 12/31/14 0659 01/01/15 0412  WBC 13.5* 8.0 12.4*  HGB 10.0* 8.2* 8.3*  HCT 30.9* 25.1* 25.9*  PLT 393 329 351  MCV 90.2 91.6 91.9  MCH 29.3 29.8 29.6  MCHC 32.5 32.5 32.2  RDW 14.8* 14.8* 15.0*  LYMPHSABS 1.0 --  --   MONOABS 0.8 --  --   EOSABS 0.0 --  --   BASOSABS 0.0 --  --       ------------------------------------------------------------------------------------------------------------------  Chemistries   Last Labs      Recent Labs Lab 12/30/14 1444 12/31/14 0659 01/01/15 0412  NA 132* 133* 134*  K 4.0 4.7 4.6  CL 97* 107 104  CO2 20* 19* 25  GLUCOSE 99 81 75  BUN 24* 25* 20  CREATININE 1.07* 1.12* 0.83  CALCIUM 8.7* 7.3* 7.5*  AST 12* --  --   ALT 10* --  --   ALKPHOS 131* --  --   BILITOT 0.3 --  --      ------------------------------------------------------------------------------------------------------------------ estimated creatinine clearance is 81.7 mL/min (by C-G formula based on Cr of 0.83). ------------------------------------------------------------------------------------------------------------------  Recent Labs (last 2 labs)     No results for input(s): TSH, T4TOTAL, T3FREE, THYROIDAB in the last 72 hours.  Invalid input(s): FREET3     Coagulation profile  Last Labs      Recent Labs Lab 12/31/14 0659  INR 1.47     -------------------------------------------------------------------------------------------------------------------  Recent Labs (last 2 labs)     No results for input(s): DDIMER in the last 72 hours.   -------------------------------------------------------------------------------------------------------------------  Cardiac Enzymes  Last Labs      Recent Labs Lab 12/30/14 1444  TROPONINI <0.03     ------------------------------------------------------------------------------------------------------------------  Last Labs     Invalid input(s): POCBNP    ---------------------------------------------------------------------------------------------------------------  Urinalysis  Labs (Brief)       Component Value Date/Time   COLORURINE YELLOW* 12/30/2014 1945   COLORURINE Straw 05/15/2014 2120   APPEARANCEUR  CLEAR* 12/30/2014 1945   APPEARANCEUR Clear 05/15/2014 2120   LABSPEC >1.060* 12/30/2014 1945   LABSPEC 1.006 05/15/2014 2120   PHURINE 5.0 12/30/2014 1945   PHURINE 5.0 05/15/2014 2120   GLUCOSEU NEGATIVE 12/30/2014 1945   GLUCOSEU Negative 05/15/2014 2120   HGBUR NEGATIVE 12/30/2014 1945   HGBUR Negative 05/15/2014 2120   BILIRUBINUR NEGATIVE 12/30/2014 1945   BILIRUBINUR Negative 05/15/2014 2120   KETONESUR NEGATIVE 12/30/2014 1945   KETONESUR Negative 05/15/2014 2120   PROTEINUR NEGATIVE 12/30/2014 1945   PROTEINUR Negative 05/15/2014 2120   NITRITE NEGATIVE 12/30/2014 1945   NITRITE Negative 05/15/2014 2120   LEUKOCYTESUR 1+* 12/30/2014 1945   LEUKOCYTESUR Trace 05/15/2014 2120       RADIOLOGY:  Imaging Results (Last 48 hours)    Dg Chest 1 View  12/30/2014 CLINICAL DATA: Abdominal pain, nausea and vomiting beginning last night. Initial encounter. EXAM: CHEST 1 VIEW COMPARISON: PA and lateral chest 08/19/2011 and 03/22/2008. FINDINGS: Lung volumes are low with basilar atelectasis. No pneumothorax or pleural effusion. Heart size is normal. IMPRESSION: Low lung volumes with bibasilar atelectasis. No acute abnormality. Electronically Signed By: Drusilla Kanner M.D. On: 12/30/2014 15:40   Ct Angio Chest Aorta W/cm &/or Wo/cm  12/30/2014 CLINICAL DATA: Patient brought in by [facility] c/o hypotension, chest pain, abdominal pain, nausea, and vomiting that started last night around 10 pm. Patient denies diarrhea. EXAM: CT ANGIOGRAPHY CHEST, ABDOMEN AND PELVIS TECHNIQUE: Multidetector CT imaging through the chest, abdomen and pelvis was performed using the standard protocol during bolus administration of intravenous contrast. Multiplanar reconstructed images and MIPs were obtained and reviewed to evaluate the vascular anatomy. CONTRAST: OMNIPAQUE IOHEXOL 350 MG/ML SOLN COMPARISON: 05/04/2014  FINDINGS: CHEST The noncontrast scout shows no hyperdense crescent, mediastinal hematoma, pleural or pericardial effusion. Scattered aortic arch calcifications. Right arm IV contrast injection. The SVC is patent. RV is nondilated. Satisfactory opacification of pulmonary arteries noted, and there is no evidence of pulmonary emboli. Patent pulmonary veins. Adequate contrast opacification of the thoracic aorta with no evidence of dissection, aneurysm, or stenosis. There is classic 3-vessel brachiocephalic arch anatomy without proximal stenosis. Calcified right hilar and subcarinal lymph nodes. No mediastinal adenopathy. Linear subsegmental atelectasis or scarring in the right middle and lower lobes, and inferior lingula. Review of the MIP images confirms the above findings. ABDOMEN AND PELVIS Arterial findings: Aorta: Scattered calcified plaque. Focal penetrating atheromatous ulcer or limited nonocclusive dissection in the infrarenal segment. No aneurysm or stenosis. Celiac axis: Patent Superior mesenteric: Patent, with replaced right hepatic arterial supply, an anatomic variant. Left renal: Duplicated, superior dominant, both patent. Right renal: Single, patent. Inferior mesenteric: Patent, with origin stenosis related to aortic wall plaque. Left iliac: Eccentric calcified nonocclusive plaque through the common and internal iliac arteries. External iliac widely patent. Right iliac: Scattered eccentric calcified plaque through the common iliac into the proximal internal iliac artery. External iliac widely patent. Venous findings: Dedicated venous phase imaging not obtained. Note made of patent portal, splenic and renal veins. Review of the MIP images confirms the above findings. Nonvascular findings: Scattered freed intraperitoneal air. Mild perihepatic and perisplenic ascites. Stomach is incompletely distended, with some wall thickening suggested in the body and antrum. There is some  focal gas  collection contiguous with the mucosa of the gastric antrum suggesting possible gastric ulceration as the source of free intraperitoneal gas and fluid. Small bowel and colon are nondilated. Appendix not identified. No significant diverticular disease. Unremarkable liver, gallbladder, spleen and accessory splenules, adrenal glands, kidneys, pancreas. Urinary bladder is nondistended. Lumbar spine and bony pelvis unremarkable. IMPRESSION: 1. Free intraperitoneal air and fluid in the upper abdomen consistent with perforated viscus. Apparent mucosal defect in the gastric antrum and associated gas collection suggest perforated gastric ulcer. Critical Value/emergent results were called by telephone at the time of interpretation on 12/30/2014 at 5:09 pm to Dr. Gladstone Pih , who verbally acknowledged these results. 2. Negative for acute PE or thoracic aortic dissection. 3. Linear scarring or atelectasis in the lung bases right greater than left. 4. Age-advanced aortoiliac atheromatous plaque. Electronically Signed By: Corlis Leak M.D. On: 12/30/2014 17:09   Ct Angio Abd/pel W/ And/or W/o  12/30/2014 CLINICAL DATA: Patient brought in by [facility] c/o hypotension, chest pain, abdominal pain, nausea, and vomiting that started last night around 10 pm. Patient denies diarrhea. EXAM: CT ANGIOGRAPHY CHEST, ABDOMEN AND PELVIS TECHNIQUE: Multidetector CT imaging through the chest, abdomen and pelvis was performed using the standard protocol during bolus administration of intravenous contrast. Multiplanar reconstructed images and MIPs were obtained and reviewed to evaluate the vascular anatomy. CONTRAST: OMNIPAQUE IOHEXOL 350 MG/ML SOLN COMPARISON: 05/04/2014 FINDINGS: CHEST The noncontrast scout shows no hyperdense crescent, mediastinal hematoma, pleural or pericardial effusion. Scattered aortic arch calcifications. Right arm IV contrast injection. The SVC is patent. RV is nondilated. Satisfactory opacification of  pulmonary arteries noted, and there is no evidence of pulmonary emboli. Patent pulmonary veins. Adequate contrast opacification of the thoracic aorta with no evidence of dissection, aneurysm, or stenosis. There is classic 3-vessel brachiocephalic arch anatomy without proximal stenosis. Calcified right hilar and subcarinal lymph nodes. No mediastinal adenopathy. Linear subsegmental atelectasis or scarring in the right middle and lower lobes, and inferior lingula. Review of the MIP images confirms the above findings. ABDOMEN AND PELVIS Arterial findings: Aorta: Scattered calcified plaque. Focal penetrating atheromatous ulcer or limited nonocclusive dissection in the infrarenal segment. No aneurysm or stenosis. Celiac axis: Patent Superior mesenteric: Patent, with replaced right hepatic arterial supply, an anatomic variant. Left renal: Duplicated, superior dominant, both patent. Right renal: Single, patent. Inferior mesenteric: Patent, with origin stenosis related to aortic wall plaque. Left iliac: Eccentric calcified nonocclusive plaque through the common and internal iliac arteries. External iliac widely patent. Right iliac: Scattered eccentric calcified plaque through the common iliac into the proximal internal iliac artery. External iliac widely patent. Venous findings: Dedicated venous phase imaging not obtained. Note made of patent portal, splenic and renal veins. Review of the MIP images confirms the above findings. Nonvascular findings: Scattered freed intraperitoneal air. Mild perihepatic and perisplenic ascites. Stomach is incompletely distended, with some wall thickening suggested in the body and antrum. There is some focal gas collection contiguous with the mucosa of the gastric antrum suggesting possible gastric ulceration as the source of free intraperitoneal gas and fluid. Small bowel and colon are nondilated. Appendix not identified. No significant diverticular disease.  Unremarkable liver, gallbladder, spleen and accessory splenules, adrenal glands, kidneys, pancreas. Urinary bladder is nondistended. Lumbar spine and bony pelvis unremarkable. IMPRESSION: 1. Free intraperitoneal air and fluid in the upper abdomen consistent with perforated viscus. Apparent mucosal defect in the gastric antrum and associated gas collection suggest perforated gastric ulcer. Critical Value/emergent results were called by telephone at the time  of interpretation on 12/30/2014 at 5:09 pm to Dr. Gladstone Pih , who verbally acknowledged these results. 2. Negative for acute PE or thoracic aortic dissection. 3. Linear scarring or atelectasis in the lung bases right greater than left. 4. Age-advanced aortoiliac atheromatous plaque. Electronically Signed By: Corlis Leak M.D. On: 12/30/2014 17:09     EKG: Orders placed or performed in visit on 04/23/11  . EKG 12-Lead    IMPRESSION AND PLAN:  * perforated gastric ulcer status post surgery 12/31/14. Management per primary team , she is on antibiotic.  DVT prophylaxis. Dilaudid for pain.  * Ac respi failure- hypoxic   X ray shows b/l Infiltrates Vs - edema   IV lasix and Echo.    She was also a chronic smoker.    GIve nebs.    On HFNC.     Responded some with IV lasix     Echo shows EF 55%  * Tachycardia It is sinus tachycardia, and most likely driven by her severe pain. I would suggest continue pain control with Dilaudid and give her some IV fluids.  * UTI  she is on Zosyn for her intra-abdominal surgery.   * Anemia Slight drop in hemoglobin after surgery, continue monitoring.  * Gastric ulcer disease With chronic use of pain medication and Goody powders. I will give Protonix IV twice a day, and advised to follow in GI clinic after a month.  * Depression and bipolar disorders Hold all oral medications at this point.  All the records are reviewed and case discussed with ED provider. Management plans  discussed with the patient, family and they are in agreement. Condition is critical due to respi failure.  CODE STATUS:    Code Status Orders        Start   Ordered   12/31/14 0226  Full code Continuous    12/31/14 0226       TOTAL TIME TAKING CARE OF THIS PATIENT: 45 Critical care minutes.  Patient's son present in the room. Discussed with critical care specialist.  Altamese Dilling M.D on 01/01/2015   Between 7am to 6pm - Pager - [phone]  After 6pm go to [email or web address] - password [email or web address]  [facility]  Office [phone]  CC: Primary care physician; Pcp Not In System   Note: This dictation was prepared with Dragon dictation along with smaller phrase technology. Any transcriptional errors that result from this process are unintentional.

## 2015-01-02 NOTE — Progress Notes (Signed)
Attempted to wean patient from HFNC to [place]. Patient remained off of HFNC for about 30 minutes and then began to desaturate. Patient has been placed back on HFNC with saturations in the high 90s.

## 2015-01-02 NOTE — Care Management (Signed)
High flow 02  at 80%

## 2015-01-02 NOTE — Progress Notes (Signed)
eLink Physician-Brief Progress Note Patient Name: Margaree MackintoshKimberly C Mcbreen DOB: January 06, 1966 MRN: [id number]   Date of Service  01/02/2015  HPI/Events of Note  O2 sat marginal on high flow O2. CXR reveals lung injury vs edema vs atelectasis.   eICU Interventions  Will add Incentive Spirometry Q 1 hour while awake.      Intervention Category Major Interventions: Hypoxemia - evaluation and management  Sommer,Steven Eugene 01/02/2015, 5:08 PM

## 2015-01-02 NOTE — Progress Notes (Signed)
Initial Nutrition Assessment    INTERVENTION:   Coordination of Care: await diet progression   NUTRITION DIAGNOSIS:   Inadequate oral intake related to acute illness, altered GI function as evidenced by NPO status.  GOAL:   Patient will meet greater than or equal to 90% of their needs  MONITOR:    (Energy Intake, Anthropometrics, Digestive System, Electrolyte/Renal Profile, Glucose Profile)  REASON FOR ASSESSMENT:   LOS    ASSESSMENT:    Pt admitted with abdominal pain, perforated viscous s/p graham patch of peptic ulcer on 12/13; pt confused, wearing mittens, on HFNC  Past Medical History  Diagnosis Date  . Wears glasses   . Depression   . Anxiety   . GERD (gastroesophageal reflux disease)   . Vaginal atrophy   . Lichen   . Surgical menopause   . Endometriosis   . Dyspareunia   . Insomnia   . Bipolar disorder (HCC)      Diet Order:  Diet NPO time specified   Energy Intake: NPO day 3  Digestive System: abdomen distended, NG for decompression with minimal output, last BM documented 12/13  Electrolyte and Renal Profile:  Recent Labs Lab 12/31/14 0659 01/01/15 0412 01/02/15 0501  BUN 25* 20 10  CREATININE 1.12* 0.83 0.68  NA 133* 134* 138  K 4.7 4.6 3.7   Glucose Profile:   Recent Labs  12/31/14 0546 12/31/14 0754 12/31/14 1609  GLUCAP 81 79 82   Meds: D5-NS at 75 ml/hr, lasix, solumedrol  Nutrition Focused Physical Exam:  Unable to complete Nutrition-Focused physical exam at this time.    Height:   Ht Readings from Last 1 Encounters:  12/30/14 5\' 3"  (1.6 m)    Weight: per weight encounters, pt with 17.8% wt loss from September of 2015 to September of 2016; since then, appears pt wt trending up  Wt Readings from Last 1 Encounters:  01/01/15 167 lb 5.3 oz (75.9 kg)   Wt Readings from Last 10 Encounters:  01/01/15 167 lb 5.3 oz (75.9 kg)  11/06/14 156 lb (70.761 kg)  09/24/14 152 lb 3.2 oz (69.037 kg)  10/08/13 185 lb (83.915  kg)  09/20/13 187 lb (84.823 kg)  08/25/13 187 lb (84.823 kg)    BMI:  Body mass index is 29.65 kg/(m^2).  Estimated Nutritional Needs:   Kcal:  [id number] kcals (BEE 1363, 1.3 AF, 1.1-1.3 IF)   Protein:  84-106 g (1.1-1.4 g/kg)   Fluid:  1900-2280 mL (25-30 ml/kg)      MODERATE Care Level  Brandy Starcherate Yareliz Thorstenson MS, RD, LDN [phone] Pager

## 2015-01-02 NOTE — Clinical Documentation Improvement (Signed)
Internal Medicine  The medical record includes the following unapproved, unclear or unknown abbreviation: Progress note 01/01/15:" Ac respi failure- hypoxic"  Please document the associated medical diagnosis related to the above abbreviation.   Please exercise your independent, professional judgment when responding. A specific answer is not anticipated or expected. Please update your documentation within the medical record to reflect your response to this query. Thank you  Thank Barrie DunkerYou, Karsen Nakanishi C Weda Baumgarner Health Information Management [place] [phone]

## 2015-01-02 NOTE — Progress Notes (Signed)
3 Days Post-Op   Subjective:  49 year old female postop day 3 status post export her laparotomy with lysis of adhesions and gram patch repair of perforated gastric ulcer. Remains in critical care setting due to respiratory difficulty, tachycardia, pain control needs. Patient reports she feels somewhat better today but is not overly interactive during my exam.  Vital signs in last 24 hours: Temp:  [97.5 F (36.4 C)-100.1 F (37.8 C)] 98.8 F (37.1 C) (12/16 0833) Pulse Rate:  [108-124] 109 (12/16 0700) Resp:  [10-28] 10 (12/16 0700) BP: (90-128)/(46-99) 113/59 mmHg (12/16 0700) SpO2:  [88 %-100 %] 97 % (12/16 0700) FiO2 (%):  [65 %] 65 % (12/15 2339) Last BM Date: 12/30/14  Intake/Output from previous day: 12/15 0701 - 12/16 0700 In: 2240 [I.V.:1980; IV Piggyback:200] Out: 5125 [Urine:5075; Drains:50]  Exam: General: Resting in bed in no acute distress but requiring mittens to prevent removal of tubes and drains Chest: Coarse breath sounds throughout however respiratory effort is equal as our breath sounds Heart: Sinus tachycardia GI: abdomen is mildly distended, soft, midline incision well approximated without evidence of drainage or infection, multiple drains in place ( 2 drains on the right and one drain on the left all draining serous fluid now)  Lab Results:  CBC  Recent Labs  01/01/15 0412 01/02/15 0501  WBC 12.4* 14.0*  HGB 8.3* 7.9*  HCT 25.9* 24.5*  PLT 351 329   CMP     Component Value Date/Time   NA 138 01/02/2015 0501   NA 139 05/15/2014 2120   K 3.7 01/02/2015 0501   K 4.0 05/15/2014 2120   CL 95* 01/02/2015 0501   CL 110 05/15/2014 2120   CO2 34* 01/02/2015 0501   CO2 22 05/15/2014 2120   GLUCOSE 131* 01/02/2015 0501   GLUCOSE 88 05/15/2014 2120   BUN 10 01/02/2015 0501   BUN 10 05/15/2014 2120   CREATININE 0.68 01/02/2015 0501   CREATININE 0.78 05/15/2014 2120   CALCIUM 7.9* 01/02/2015 0501   CALCIUM 9.3 05/15/2014 2120   PROT 6.3* 12/30/2014  1444   PROT 7.2 05/15/2014 2120   ALBUMIN 2.6* 12/30/2014 1444   ALBUMIN 3.7 05/15/2014 2120   AST 12* 12/30/2014 1444   AST 17 05/15/2014 2120   ALT 10* 12/30/2014 1444   ALT 11* 05/15/2014 2120   ALKPHOS 131* 12/30/2014 1444   ALKPHOS 103 05/15/2014 2120   BILITOT 0.3 12/30/2014 1444   BILITOT 0.4 05/15/2014 2120   GFRNONAA >60 01/02/2015 0501   GFRNONAA >60 05/15/2014 2120   GFRNONAA >60 04/25/2011 0755   GFRAA >60 01/02/2015 0501   GFRAA >60 05/15/2014 2120   GFRAA >60 04/25/2011 0755   PT/INR  Recent Labs  12/31/14 0659 01/02/15 0501  LABPROT 17.9* 15.3*  INR 1.47 1.19    Studies/Results: Dg Chest Port 1 View  01/02/2015  CLINICAL DATA:  49 year old ICU patient with hypoxia status post abdominal laparotomy for perforated peptic ulcer. Postoperative day 3. Initial encounter. EXAM: PORTABLE CHEST 1 VIEW COMPARISON:  01/01/2015 and earlier FINDINGS: Portable AP semi upright view at 0524 hours. Enteric tube courses to the abdomen, tip not included. No endotracheal tube. Acute bilateral perihilar opacity seen yesterday is less extensive, but now more confluent about the hila. Stable lung volumes. Stable cardiac size and mediastinal contours. No superimposed pneumothorax. Left lower lobe ventilation has improved but there are residual retrocardiac air bronchograms. Small left pleural effusions suspected. No areas of worsening ventilation. IMPRESSION: Mild improved ventilation since yesterday. More confluent but  less extensive perihilar opacity with some air bronchograms. Favor resolving aspiration over edema or ARDS. Small left pleural effusion suspected. Electronically Signed   By: Odessa FlemingH  Hall M.D.   On: 01/02/2015 07:28   Dg Chest Port 1 View  01/01/2015  CLINICAL DATA:  Acute respiratory failure. EXAM: PORTABLE CHEST 1 VIEW COMPARISON:  12/30/2014; CT the chest, abdomen pelvis - 01/01/2015 FINDINGS: Interval development of extensive bilateral perihilar predominant heterogeneous  airspace opacities with relative sparing of the lung periphery and obscuration of the cardiac silhouette and mediastinal contours. Suspected development of small bilateral effusions, left greater than right. Enteric tube terminates below the left hemidiaphragm. No acute osseous abnormalities. IMPRESSION: Findings worrisome for flash alveolar pulmonary edema though note, extensive aspiration could have a similar appearance. Continued attention on follow-up is recommended. Electronically Signed   By: Simonne ComeJohn  Watts M.D.   On: 01/01/2015 13:54    Assessment/Plan: 49 year old female postop day 3 status post Graham patch repair of perforated gastric ulcer. Continue NG tube decompression throughout today. So long as drain output remains minimal and nonbilious should be okay to remove NG tube tomorrow. After NG tube was removed recommend waiting at least 12 hours prior to initiating any oral diet or medications. Patient will need to continue acid suppression.  Appreciate critical care assistance with cardiopulmonary dysfunction. Patient appears to be responding to therapy with improving x-rays and decreasing tachycardia. Patient continues to require intermittent high flow oxygen to combat hypoxia.   Ricarda Frameharles Chai Routh, MD FACS General Surgeon  01/02/2015

## 2015-01-02 NOTE — Progress Notes (Signed)
[facility] called about patient's respiratory status. They recommended asking the MD to order an ABG. I spoke to MD and he states the patient's respiratory and mental status has improved since yesterday. No new orders at this time.

## 2015-01-02 NOTE — Consult Note (Signed)
[facility] Consultation     Date: 01/02/2015,   MRN# [id number] Brandy Robinson 06/01/1965 Code Status:     Code Status Orders        Start     Ordered   12/31/14 0226  Full code   Continuous     12/31/14 0226     Hosp day:@LENGTHOFSTAYDAYS @ Referring MD: @     PCP:      AdmissionWeight: 159 lb 6.3 oz (72.3 kg)                 CurrentWeight: 167 lb 5.3 oz (75.9 kg) Brandy Robinson is a 49 y.o. old female seen in consultation for elevated HR.request of Dr. Tonita Cong.   EVENTS OVER NIGHT  Tra Transferred to ICU for elevated HR, patient s/p open lap fro perforated viscus Patient with NG in place with abd distention, in abd pain, not passing gas, Ng to low wall suction Patient placed on high flow  80%, patient with acute b/l infiltrates on CXR Still some confusion and has abd pain  CXR on 01/02/15 images reviewed 01/02/2015 Shows some  improvement of infilltrates after getting lasix Will give a dose of diamox due to elevated bicarb levels   Home Medication:  No current outpatient prescriptions on file.  Current Medication:   Current facility-administered medications:  .  acetaminophen (TYLENOL) suppository 650 mg, 650 mg, Rectal, Q6H PRN, Altamese Dilling, MD .  acetaZOLAMIDE (DIAMOX) injection 250 mg, 250 mg, Intravenous, Once, Erin Fulling, MD .  antiseptic oral rinse (CPC / CETYLPYRIDINIUM CHLORIDE 0.05%) solution 7 mL, 7 mL, Mouth Rinse, BID, Ricarda Frame, MD, 7 mL at 01/01/15 2200 .  dextrose 5 %-0.9 % sodium chloride infusion, , Intravenous, Continuous, Altamese Dilling, MD, Last Rate: 75 mL/hr at 01/02/15 0537 .  diazepam (VALIUM) injection 5 mg, 5 mg, Intravenous, Q4H PRN, Ricarda Frame, MD, 5 mg at 12/31/14 1806 .  diphenhydrAMINE (BENADRYL) capsule 25 mg, 25 mg, Oral, Q6H PRN **OR** diphenhydrAMINE (BENADRYL) injection 25 mg, 25 mg, Intravenous, Q6H PRN, Ricarda Frame, MD .  enoxaparin (LOVENOX) injection 40 mg,  40 mg, Subcutaneous, Q24H, Ricarda Frame, MD, 40 mg at 01/01/15 4098 .  hydrALAZINE (APRESOLINE) injection 10 mg, 10 mg, Intravenous, Q2H PRN, Ricarda Frame, MD .  HYDROmorphone (DILAUDID) injection 2-4 mg, 2-4 mg, Intravenous, Q2H PRN, Erin Fulling, MD, 2 mg at 01/02/15 0617 .  HYDROmorphone (DILAUDID) injection 4 mg, 4 mg, Intravenous, Once, Erin Fulling, MD, 4 mg at 12/31/14 0901 .  ipratropium-albuterol (DUONEB) 0.5-2.5 (3) MG/3ML nebulizer solution 3 mL, 3 mL, Nebulization, QID, Ricarda Frame, MD .  ondansetron (ZOFRAN-ODT) disintegrating tablet 4 mg, 4 mg, Oral, Q6H PRN **OR** ondansetron (ZOFRAN) injection 4 mg, 4 mg, Intravenous, Q6H PRN, Ricarda Frame, MD .  pantoprazole (PROTONIX) injection 40 mg, 40 mg, Intravenous, Q12H, Ricarda Frame, MD, 40 mg at 01/01/15 2124 .  piperacillin-tazobactam (ZOSYN) IVPB 3.375 g, 3.375 g, Intravenous, 3 times per day, Myrna Blazer, MD, 3.375 g at 01/02/15 0143     ALLERGIES   Hydrocodone; Aspirin; and Morphine and related     REVIEW OF SYSTEMS   Review of Systems  Constitutional: Positive for malaise/fatigue. Negative for fever and chills.  Eyes: Negative for blurred vision.  Respiratory: Positive for shortness of breath. Negative for cough.   Cardiovascular: Negative for chest pain and leg swelling.  Gastrointestinal: Positive for abdominal pain. Negative for nausea.  Skin: Negative for rash.  Neurological: Negative for dizziness and headaches.  Psychiatric/Behavioral: The  patient is nervous/anxious.      VS: BP 113/59 mmHg  Pulse 109  Temp(Src) 97.5 F (36.4 C) (Oral)  Resp 10  Ht 5\' 3"  (1.6 m)  Wt 167 lb 5.3 oz (75.9 kg)  BMI 29.65 kg/m2  SpO2 97%     PHYSICAL EXAM   Physical Exam  Constitutional: She appears distressed.  HENT:  Head: Normocephalic and atraumatic.  Eyes: Conjunctivae are normal. Pupils are equal, round, and reactive to light.  Neck: Normal range of motion. Neck supple.    Pulmonary/Chest: Effort normal and breath sounds normal. No respiratory distress.  Abdominal: She exhibits distension. There is tenderness.  Incision intact, bandages in place  Musculoskeletal: She exhibits no edema.  Neurological: She is alert. No cranial nerve deficit.  Skin: Skin is warm. She is diaphoretic.        LABS    Recent Labs     12/30/14  1444  12/31/14  0659  01/01/15  0412  01/02/15  0501  HGB  10.0*  8.2*  8.3*  7.9*  HCT  30.9*  25.1*  25.9*  24.5*  MCV  90.2  91.6  91.9  90.5  WBC  13.5*  8.0  12.4*  14.0*  BUN  24*  25*  20  10  CREATININE  1.07*  1.12*  0.83  0.68  GLUCOSE  99  81  75  131*  CALCIUM  8.7*  7.3*  7.5*  7.9*  INR   --   1.47   --   1.19  ,    CULTURE RESULTS   Recent Results (from the past 240 hour(s))  Blood Culture (routine x 2)     Status: None (Preliminary result)   Collection Time: 12/30/14  5:37 PM  Result Value Ref Range Status   Specimen Description BLOOD RIGHT ASSIST CONTROL  Final   Special Requests   Final    BOTTLES DRAWN AEROBIC AND ANAEROBIC  3CC AERO, 2 ANAERO   Culture NO GROWTH 2 DAYS  Final   Report Status PENDING  Incomplete  Blood Culture (routine x 2)     Status: None (Preliminary result)   Collection Time: 12/30/14  5:40 PM  Result Value Ref Range Status   Specimen Description BLOOD LEFT HAND  Final   Special Requests   Final    BOTTLES DRAWN AEROBIC AND ANAEROBIC  3CC AERO, 2CC ANAERO   Culture NO GROWTH 2 DAYS  Final   Report Status PENDING  Incomplete  Urine culture     Status: None   Collection Time: 12/30/14  7:45 PM  Result Value Ref Range Status   Specimen Description URINE, RANDOM  Final   Special Requests NONE  Final   Culture NO GROWTH  Final   Report Status 01/01/2015 FINAL  Final  MRSA PCR Screening     Status: None   Collection Time: 12/31/14 12:24 PM  Result Value Ref Range Status   MRSA by PCR NEGATIVE NEGATIVE Final    Comment:        The GeneXpert MRSA Assay (FDA approved for  NASAL specimens only), is one component of a comprehensive MRSA colonization surveillance program. It is not intended to diagnose MRSA infection nor to guide or monitor treatment for MRSA infections.             ASSESSMENT/PLAN   49 yo white female admitted to ICU for closer monitoring of BP/HR. likely cause of elevated HR and SOB likey from pain and abd  distention with atelectasis, PE less likely at this time, CXR c/w pulm edema responded to lasix ECHO shows ef 50%  1.wean oxygen as tolerated/needed, dounebs and pulmicort nebs 2.incentive spirometry 3.pain meds as needed 4.NG to suction-follow up surgery recs 5.will give dose of diamox today and assess resp status and UO  Continue  step down status  I have personally obtained a history, examined the patient, evaluated laboratory and independently reviewed imaging results, formulated the assessment and plan and placed orders.  The Patient requires high complexity decision making for assessment and support, frequent evaluation and titration of therapies, application of advanced monitoring technologies and extensive interpretation of multiple databases.     Lucie Leather, M.D.  [facility]  Medical Director [facility] Medical Director [facility] Cardio-Pulmonary Department

## 2015-01-03 DIAGNOSIS — J9811 Atelectasis: Secondary | ICD-10-CM

## 2015-01-03 DIAGNOSIS — R000 Tachycardia, unspecified: Secondary | ICD-10-CM

## 2015-01-03 LAB — CBC
HCT: 25.6 % — ABNORMAL LOW (ref 35.0–47.0)
HEMOGLOBIN: 8 g/dL — AB (ref 12.0–16.0)
MCH: 28.9 pg (ref 26.0–34.0)
MCHC: 31.4 g/dL — AB (ref 32.0–36.0)
MCV: 92.1 fL (ref 80.0–100.0)
Platelets: 327 10*3/uL (ref 150–440)
RBC: 2.78 MIL/uL — AB (ref 3.80–5.20)
RDW: 14.8 % — ABNORMAL HIGH (ref 11.5–14.5)
WBC: 7.1 10*3/uL (ref 3.6–11.0)

## 2015-01-03 LAB — BASIC METABOLIC PANEL
Anion gap: 4 — ABNORMAL LOW (ref 5–15)
BUN: 13 mg/dL (ref 6–20)
CHLORIDE: 103 mmol/L (ref 101–111)
CO2: 32 mmol/L (ref 22–32)
CREATININE: 0.69 mg/dL (ref 0.44–1.00)
Calcium: 8.2 mg/dL — ABNORMAL LOW (ref 8.9–10.3)
GFR calc Af Amer: >60 mL/min (ref >60)
GFR calc non Af Amer: >60 mL/min (ref >60)
GLUCOSE: 173 mg/dL — AB (ref 65–99)
POTASSIUM: 4 mmol/L (ref 3.5–5.1)
SODIUM: 139 mmol/L (ref 135–145)

## 2015-01-03 LAB — AMMONIA: AMMONIA: 74 umol/L — AB (ref 9–35)

## 2015-01-03 MED ORDER — METHYLPREDNISOLONE SODIUM SUCC 125 MG IJ SOLR
60.0000 mg | Freq: Two times a day (BID) | INTRAMUSCULAR | Status: DC
  Administered 2015-01-04 (×2): 60 mg via INTRAVENOUS
  Filled 2015-01-03 (×2): qty 2

## 2015-01-03 MED ORDER — LACTULOSE ENEMA
300.0000 mL | Freq: Every day | ORAL | Status: DC
  Administered 2015-01-03 – 2015-01-04 (×2): 300 mL via RECTAL
  Filled 2015-01-03 (×5): qty 300

## 2015-01-03 NOTE — Progress Notes (Signed)
4 Days Post-Op   Subjective:  Patient without acute changes overnight. Continues to require high flow oxygen to maintain saturations. Has had improving tachycardia. Patient does not interact with me verbally. Appears to be more comfortable than prior examination.  Vital signs in last 24 hours: Temp:  [97.2 F (36.2 C)-97.8 F (36.6 C)] 97.8 F (36.6 C) (12/17 1100) Pulse Rate:  [92-110] 107 (12/17 1400) Resp:  [10-23] 17 (12/17 1400) BP: (85-133)/(51-97) 128/82 mmHg (12/17 1400) SpO2:  [80 %-100 %] 95 % (12/17 1400) FiO2 (%):  [40 %-70 %] 40 % (12/17 1106) Last BM Date: 12/30/14  Intake/Output from previous day: 12/16 0701 - 12/17 0700 In: 2050 [I.V.:1875; IV Piggyback:150] Out: 1110 [Urine:1100; Drains:10]  His exam: Gen.: Resting in bed but obviously confused Chest: Coarse breath sounds throughout Heart: Tachycardia GI: Abdomen soft but distended, appropriately tender to palpation along her midline incision and at her drain sites, 3 total JP drains all draining serous fluid.  Lab Results:  CBC  Recent Labs  01/02/15 0501 01/03/15 0611  WBC 14.0* 7.1  HGB 7.9* 8.0*  HCT 24.5* 25.6*  PLT 329 327   CMP     Component Value Date/Time   NA 139 01/03/2015 0611   NA 139 05/15/2014 2120   K 4.0 01/03/2015 0611   K 4.0 05/15/2014 2120   CL 103 01/03/2015 0611   CL 110 05/15/2014 2120   CO2 32 01/03/2015 0611   CO2 22 05/15/2014 2120   GLUCOSE 173* 01/03/2015 0611   GLUCOSE 88 05/15/2014 2120   BUN 13 01/03/2015 0611   BUN 10 05/15/2014 2120   CREATININE 0.69 01/03/2015 0611   CREATININE 0.78 05/15/2014 2120   CALCIUM 8.2* 01/03/2015 0611   CALCIUM 9.3 05/15/2014 2120   PROT 6.3* 12/30/2014 1444   PROT 7.2 05/15/2014 2120   ALBUMIN 2.6* 12/30/2014 1444   ALBUMIN 3.7 05/15/2014 2120   AST 12* 12/30/2014 1444   AST 17 05/15/2014 2120   ALT 10* 12/30/2014 1444   ALT 11* 05/15/2014 2120   ALKPHOS 131* 12/30/2014 1444   ALKPHOS 103 05/15/2014 2120   BILITOT 0.3  12/30/2014 1444   BILITOT 0.4 05/15/2014 2120   GFRNONAA >60 01/03/2015 0611   GFRNONAA >60 05/15/2014 2120   GFRNONAA >60 04/25/2011 0755   GFRAA >60 01/03/2015 0611   GFRAA >60 05/15/2014 2120   GFRAA >60 04/25/2011 0755   PT/INR  Recent Labs  01/02/15 0501  LABPROT 15.3*  INR 1.19    Studies/Results: Dg Chest Port 1 View  01/02/2015  CLINICAL DATA:  49 year old ICU patient with hypoxia status post abdominal laparotomy for perforated peptic ulcer. Postoperative day 3. Initial encounter. EXAM: PORTABLE CHEST 1 VIEW COMPARISON:  01/01/2015 and earlier FINDINGS: Portable AP semi upright view at 0524 hours. Enteric tube courses to the abdomen, tip not included. No endotracheal tube. Acute bilateral perihilar opacity seen yesterday is less extensive, but now more confluent about the hila. Stable lung volumes. Stable cardiac size and mediastinal contours. No superimposed pneumothorax. Left lower lobe ventilation has improved but there are residual retrocardiac air bronchograms. Small left pleural effusions suspected. No areas of worsening ventilation. IMPRESSION: Mild improved ventilation since yesterday. More confluent but less extensive perihilar opacity with some air bronchograms. Favor resolving aspiration over edema or ARDS. Small left pleural effusion suspected. Electronically Signed   By: Odessa FlemingH  Hall M.D.   On: 01/02/2015 07:28    Assessment/Plan: 49 year old female postop day 4 status post Cheree DittoGraham patch repair of perforated gastric  ulcer. NG tube removed this morning. We will need to monitor drain output over the next 12-24 hours to ensure that amount of fluid and quality of fluid does not change. So long as the removal of the NG tube is tolerated without change in nausea, vomiting, drain output it will be okay to start an oral diet. Patient continues to show confusion but is more alert today than days prior. As she improves her cognitive state we'll continue to evaluate for ambulation and  removal of Foley. Appreciate internal medicine and critical care consultation and assistance in managing this complicated patient.   Ricarda Frame, MD FACS General Surgeon  01/03/2015

## 2015-01-03 NOTE — Progress Notes (Signed)
Informed by nursing staff that patient has and elevated ammonia level in the setting of confusion Given recent perforated ulcer repair still npo - however hopefully po tomorrow/soon In the meantime lactulose enema, when tolerates po switch to oral lactulose titrate to 2-3 bowel movements daily

## 2015-01-03 NOTE — Progress Notes (Signed)
[facility] [place] at [facility]   PATIENT NAME: Brandy PyleKimberly Robinson    MR#:  [id number]  DATE OF BIRTH:  1965-11-23  SUBJECTIVE:  CHIEF COMPLAINT:   Chief Complaint  Patient presents with  . Abdominal Pain   -Patient admitted for perforated gastric ulcer and had surgery done. Postop course complicated by disorientation, tachycardia, hypoxia due to ARDS. -Patient is alert and requesting pain medicine. Appears restless. Still appears to be confused. -Remains on high flow nasal cannula.  REVIEW OF SYSTEMS:  Review of Systems  Unable to perform ROS: mental status change    DRUG ALLERGIES:   Allergies  Allergen Reactions  . Hydrocodone Itching  . Aspirin Other (See Comments)    Reaction:  GI upset   . Morphine And Related Itching, Nausea And Vomiting and Other (See Comments)    Reaction:  Chest pain    VITALS:  Blood pressure 109/97, pulse 109, temperature 97.8 F (36.6 C), temperature source Axillary, resp. rate 16, height 5\' 3"  (1.6 m), weight 75.9 kg (167 lb 5.3 oz), SpO2 97 %.  PHYSICAL EXAMINATION:  Physical Exam  GENERAL:  49 y.o.-year-old patient lying in the bed, moving restless due to pain.  EYES: Pupils equal, round, reactive to light and accommodation. No scleral icterus. Extraocular muscles intact.  HEENT: Head atraumatic, normocephalic. Oropharynx and nasopharynx clear.  NECK:  Supple, no jugular venous distention. No thyroid enlargement, no tenderness.  LUNGS: Remains on high flow nasal cannula. Decreased bibasilar breath sounds. Otherwise moving air bilaterally, no wheezing, rales,rhonchi or crepitation. No use of accessory muscles of respiration.  CARDIOVASCULAR: S1, S2 normal. No murmurs, rubs, or gallops.  ABDOMEN: Abdomen is distended, recent surgeries with dressings and 2 JP drains in place. Tender diffusely. Hypoactive bowel sounds are present.  EXTREMITIES: No pedal edema, cyanosis, or clubbing.  NEUROLOGIC: Cranial nerves II  through XII are intact. Owing all extremities in bed. Following commands. Appears disoriented. Repeating the same words at times. Able to recognize family. Sensation intact. Gait not checked.  PSYCHIATRIC: The patient is alert and oriented to self.  SKIN: No obvious rash, lesion, or ulcer.    LABORATORY PANEL:   CBC  Recent Labs Lab 01/03/15 0611  WBC 7.1  HGB 8.0*  HCT 25.6*  PLT 327   ------------------------------------------------------------------------------------------------------------------  Chemistries   Recent Labs Lab 12/30/14 1444  01/03/15 0611  NA 132*  < > 139  K 4.0  < > 4.0  CL 97*  < > 103  CO2 20*  < > 32  GLUCOSE 99  < > 173*  BUN 24*  < > 13  CREATININE 1.07*  < > 0.69  CALCIUM 8.7*  < > 8.2*  AST 12*  --   --   ALT 10*  --   --   ALKPHOS 131*  --   --   BILITOT 0.3  --   --   < > = values in this interval not displayed. ------------------------------------------------------------------------------------------------------------------  Cardiac Enzymes  Recent Labs Lab 12/30/14 1444  TROPONINI <0.03   ------------------------------------------------------------------------------------------------------------------  RADIOLOGY:  Dg Chest Port 1 View  01/02/2015  CLINICAL DATA:  49 year old ICU patient with hypoxia status post abdominal laparotomy for perforated peptic ulcer. Postoperative day 3. Initial encounter. EXAM: PORTABLE CHEST 1 VIEW COMPARISON:  01/01/2015 and earlier FINDINGS: Portable AP semi upright view at 0524 hours. Enteric tube courses to the abdomen, tip not included. No endotracheal tube. Acute bilateral perihilar opacity seen yesterday is less extensive, but now more  confluent about the hila. Stable lung volumes. Stable cardiac size and mediastinal contours. No superimposed pneumothorax. Left lower lobe ventilation has improved but there are residual retrocardiac air bronchograms. Small left pleural effusions suspected. No areas  of worsening ventilation. IMPRESSION: Mild improved ventilation since yesterday. More confluent but less extensive perihilar opacity with some air bronchograms. Favor resolving aspiration over edema or ARDS. Small left pleural effusion suspected. Electronically Signed   By: Odessa Fleming M.D.   On: 01/02/2015 07:28   Dg Chest Port 1 View  01/01/2015  CLINICAL DATA:  Acute respiratory failure. EXAM: PORTABLE CHEST 1 VIEW COMPARISON:  12/30/2014; CT the chest, abdomen pelvis - 01/01/2015 FINDINGS: Interval development of extensive bilateral perihilar predominant heterogeneous airspace opacities with relative sparing of the lung periphery and obscuration of the cardiac silhouette and mediastinal contours. Suspected development of small bilateral effusions, left greater than right. Enteric tube terminates below the left hemidiaphragm. No acute osseous abnormalities. IMPRESSION: Findings worrisome for flash alveolar pulmonary edema though note, extensive aspiration could have a similar appearance. Continued attention on follow-up is recommended. Electronically Signed   By: Simonne Come M.D.   On: 01/01/2015 13:54    EKG:   Orders placed or performed in visit on 04/23/11  . EKG 12-Lead    ASSESSMENT AND PLAN:   *Perforated gastric ulcer status post surgery 12/31/14. Management per primary team , on empiric antibiotics. -Remains nothing by mouth. NG tube taken out this morning. -No bowel movement yet.  -Continue supportive management with fluids and pain medications  * Acute respi failure- hypoxic  X ray shows b/l Infiltrates Vs - edema -Likely ARDS. Proving with Lasix and also high flow nasal cannula. Remains on 50% FiO2. -Appreciate pulmonary consult. -Echocardiogram with normal ejection fraction of 50-55%.  IV lasix and Echo. - nebs prn -Also on IV steroids due to history of significant smoking. Should be able to start tapering them by tomorrow  * Tachycardia It is sinus tachycardia, and  most likely driven by her severe pain. I would suggest continue pain control with Dilaudid and give her some IV fluids.  * UTI  she is on Zosyn for her intra-abdominal surgery.   * Anemia Slight drop in hemoglobin after surgery, continue monitoring.  * Gastric ulcer disease With chronic use of NSAIDS and Goody powders. continue Protonix IV twice a day, and advised to follow in GI clinic after a month.  * Depression and bipolar disorders Hold all oral medications at this point.  * Delirium versus metabolic encephalopathy-drowsy yesterday, more alert but confused today. Able to recognize family. -Continue to monitor. Neuro checks as needed.  All the records are reviewed and case discussed with Care Management/Social Workerr. Management plans discussed with the patient, family and they are in agreement.  CODE STATUS: Full Code  TOTAL CRITICAL CARE TIME SPENT IN TAKING CARE OF THIS PATIENT: 37 minutes.     Enid Baas M.D on 01/03/2015 at 11:43 AM  Between 7am to 6pm - Pager - [phone]  After 6pm go to [email or web address] - password [email or web address]  [facility]  Office  [phone]  CC: Primary care physician; Pcp Not In System

## 2015-01-03 NOTE — Progress Notes (Signed)
PULMONARY / CRITICAL CARE MEDICINE   Name: Brandy Robinson MRN: [id number] DOB: 05-02-1965    ADMISSION DATE:  12/30/2014  BRIEF HISTORY: 49 year old female past medical history of depression, anxiety, gastric reflux, insomnia, bipolar, takes ibuprofen and powders for pain at home. Admitted with severe abdominal pain, found to have free air in the peritoneum, taken today or for perforated gastric ulcer surgery with worsening confusion and hypotension, transferred to ICU with elevated heart rate, status post open laparotomy for perforated viscus..  SUBJECTIVE:  Overnight FiO2 weaned down to 50%, surgery plans to pull MG 2 today and attempt by mouth feedings. Patient still with periods of confusion, his sats have been greater than 90%.    VITAL SIGNS: Temp:  [97.2 F (36.2 C)-97.6 F (36.4 C)] 97.2 F (36.2 C) (12/17 0800) Pulse Rate:  [92-111] 97 (12/17 0900) Resp:  [10-23] 20 (12/17 0900) BP: (90-133)/(54-91) 113/61 mmHg (12/17 0900) SpO2:  [80 %-100 %] 93 % (12/17 0900) FiO2 (%):  [50 %-70 %] 70 % (12/17 0745) HEMODYNAMICS:   VENTILATOR SETTINGS: Vent Mode:  [-]  FiO2 (%):  [50 %-70 %] 70 % INTAKE / OUTPUT:  Intake/Output Summary (Last 24 hours) at 01/03/15 1033 Last data filed at 01/03/15 0900  Gross per 24 hour  Intake   1850 ml  Output   1435 ml  Net    415 ml    Review of Systems  Constitutional: Negative for fever, chills and weight loss.  Respiratory: Negative for cough.   Cardiovascular: Negative for chest pain and palpitations.  Gastrointestinal: Positive for heartburn and abdominal pain.  Genitourinary: Negative for dysuria.  Musculoskeletal: Negative for myalgias.  Skin: Negative for rash.  Neurological: Negative for dizziness, seizures and headaches.  Psychiatric/Behavioral: The patient is nervous/anxious.     Physical Exam  Constitutional: She is well-developed, well-nourished, and in no distress.  HENT:  Head: Normocephalic and atraumatic.  Right  Ear: External ear normal.  Left Ear: External ear normal.  Nose: Nose normal.  Mouth/Throat: Oropharynx is clear and moist.  Eyes: Conjunctivae and EOM are normal. Pupils are equal, round, and reactive to light. Right eye exhibits no discharge. Left eye exhibits no discharge.  Neck: Normal range of motion. Neck supple.  Cardiovascular: Normal rate, regular rhythm, normal heart sounds and intact distal pulses.  Exam reveals no friction rub.   No murmur heard. Pulmonary/Chest: Effort normal and breath sounds normal. No respiratory distress. She has no wheezes. She has no rales.  Abdominal: She exhibits distension. There is tenderness. There is no rebound.  Incision intact, no purulent drainage, bandages in place  Neurological:  Confused, altered mental status  Skin: Skin is warm and dry.  Nursing note and vitals reviewed.    LABS:  CBC  Recent Labs Lab 01/01/15 0412 01/02/15 0501 01/03/15 0611  WBC 12.4* 14.0* 7.1  HGB 8.3* 7.9* 8.0*  HCT 25.9* 24.5* 25.6*  PLT 351 329 327   Coag's  Recent Labs Lab 12/31/14 0659 01/02/15 0501  INR 1.47 1.19   BMET  Recent Labs Lab 01/01/15 0412 01/02/15 0501 01/03/15 0611  NA 134* 138 139  K 4.6 3.7 4.0  CL 104 95* 103  CO2 25 34* 32  BUN 20 10 13   CREATININE 0.83 0.68 0.69  GLUCOSE 75 131* 173*   Electrolytes  Recent Labs Lab 01/01/15 0412 01/02/15 0501 01/03/15 0611  CALCIUM 7.5* 7.9* 8.2*   Sepsis Markers  Recent Labs Lab 12/30/14 1737  LATICACIDVEN 2.6*   ABG No  results for input(s): PHART, PCO2ART, PO2ART in the last 168 hours. Liver Enzymes  Recent Labs Lab 12/30/14 1444  AST 12*  ALT 10*  ALKPHOS 131*  BILITOT 0.3  ALBUMIN 2.6*   Cardiac Enzymes  Recent Labs Lab 12/30/14 1444  TROPONINI <0.03   Glucose  Recent Labs Lab 12/31/14 0546 12/31/14 0754 12/31/14 1609  GLUCAP 81 79 82    Imaging No results found.  LINES:   CULTURES: Bld Cx x 2 12/14>>neg to  date  ANTIBIOTICS Zosyn 12/14>> Vanc 12/14  ASSESSMENT / PLAN:  49 yo white female admitted to ICU for closer monitoring of BP/HR. likely cause of elevated HR and SOB likey from pain and abd distention with atelectasis, PE less likely at this time, CXR c/w pulm edema responded to lasix ECHO shows ef 50%  1.wean oxygen as tolerated/needed, dounebs and pulmicort nebs 2.incentive spirometry 3.pain meds as needed 4.NG to suction-follow up surgery recs - plan to pull NGT today and possible feed 5.will give dose of diamox today and assess resp status and UO  Continue step down status  Thank you for consulting [facility], Please feel free to contacts [name] with any questions at [phone] (please enter 7-digits).  I have personally obtained a history, examined the patient, evaluated laboratory and imaging results, formulated the assessment and plan and placed orders.  The Patient requires high complexity decision making for assessment and support, frequent evaluation and titration of therapies, application of advanced monitoring technologies and extensive interpretation of multiple databases. Critical Care Time devoted to patient care services described in this note is 35 minutes.   Overall, patient is critically ill, prognosis is guarded. Patient at high risk for cardiac arrest and death.    Stephanie Acre, MD [facility] Pager [phone] (please enter 7-digits) On Call Pager - [phone] (please enter 7-digits)  Note: This note was prepared with Dragon dictation along with smaller phrase technology. Any transcriptional errors that result from this process are unintentional.

## 2015-01-03 NOTE — Progress Notes (Signed)
ANTIBIOTIC CONSULT NOTE -follow up  Pharmacy Consult for Zosyn dosing Indication: intra-abdominal infection  Allergies  Allergen Reactions  . Hydrocodone Itching  . Aspirin Other (See Comments)    Reaction:  GI upset   . Morphine And Related Itching, Nausea And Vomiting and Other (See Comments)    Reaction:  Chest pain    Patient Measurements: Height: 5\' 3"  (160 cm) Weight: 167 lb 5.3 oz (75.9 kg) IBW/kg (Calculated) : 52.4 Adjusted Body Weight: n/a  Vital Signs: Temp: 97.8 F (36.6 C) (12/17 1100) Temp Source: Axillary (12/17 1100) BP: 128/82 mmHg (12/17 1400) Pulse Rate: 107 (12/17 1400) Intake/Output from previous day: 12/16 0701 - 12/17 0700 In: 2050 [I.V.:1875; IV Piggyback:150] Out: 1110 [Urine:1100; Drains:10] Intake/Output from this shift: Total I/O In: 573.8 [I.V.:523.8; IV Piggyback:50] Out: 725 [Urine:725]  Labs:  Recent Labs  01/01/15 0412 01/02/15 0501 01/03/15 0611  WBC 12.4* 14.0* 7.1  HGB 8.3* 7.9* 8.0*  PLT 351 329 327  CREATININE 0.83 0.68 0.69   Estimated Creatinine Clearance: 84.8 mL/min (by C-G formula based on Cr of 0.69).   Microbiology: Recent Results (from the past 720 hour(s))  Blood Culture (routine x 2)     Status: None (Preliminary result)   Collection Time: 12/30/14  5:37 PM  Result Value Ref Range Status   Specimen Description BLOOD RIGHT ASSIST CONTROL  Final   Special Requests   Final    BOTTLES DRAWN AEROBIC AND ANAEROBIC  3CC AERO, 2 ANAERO   Culture NO GROWTH 4 DAYS  Final   Report Status PENDING  Incomplete  Blood Culture (routine x 2)     Status: None (Preliminary result)   Collection Time: 12/30/14  5:40 PM  Result Value Ref Range Status   Specimen Description BLOOD LEFT HAND  Final   Special Requests   Final    BOTTLES DRAWN AEROBIC AND ANAEROBIC  3CC AERO, 2CC ANAERO   Culture NO GROWTH 4 DAYS  Final   Report Status PENDING  Incomplete  Urine culture     Status: None   Collection Time: 12/30/14  7:45 PM   Result Value Ref Range Status   Specimen Description URINE, RANDOM  Final   Special Requests NONE  Final   Culture NO GROWTH  Final   Report Status 01/01/2015 FINAL  Final  MRSA PCR Screening     Status: None   Collection Time: 12/31/14 12:24 PM  Result Value Ref Range Status   MRSA by PCR NEGATIVE NEGATIVE Final    Comment:        The GeneXpert MRSA Assay (FDA approved for NASAL specimens only), is one component of a comprehensive MRSA colonization surveillance program. It is not intended to diagnose MRSA infection nor to guide or monitor treatment for MRSA infections.     Medical History: Past Medical History  Diagnosis Date  . Wears glasses   . Depression   . Anxiety   . GERD (gastroesophageal reflux disease)   . Vaginal atrophy   . Lichen   . Surgical menopause   . Endometriosis   . Dyspareunia   . Insomnia   . Bipolar disorder [facility](HCC)      Assessment: 49 y/o F s/p exploratory laparotomy for perforated gastric ulcer.   12/13 Bcx NGTD 12/13 Ucx NGTD MRSA PCR Negative  Plan:  NG removed today, NPO. Post-op Day 4. Will continue Zosyn 3.375 g EI q 8 hours.   Bari MantisKristin Momoko Slezak PharmD Clinical Pharmacist 01/03/2015 ,3:02 PM

## 2015-01-04 ENCOUNTER — Inpatient Hospital Stay: Payer: Self-pay

## 2015-01-04 DIAGNOSIS — R451 Restlessness and agitation: Secondary | ICD-10-CM

## 2015-01-04 DIAGNOSIS — J9600 Acute respiratory failure, unspecified whether with hypoxia or hypercapnia: Secondary | ICD-10-CM | POA: Insufficient documentation

## 2015-01-04 DIAGNOSIS — R400 Somnolence: Secondary | ICD-10-CM

## 2015-01-04 DIAGNOSIS — J9601 Acute respiratory failure with hypoxia: Secondary | ICD-10-CM

## 2015-01-04 LAB — BLOOD GAS, ARTERIAL
ACID-BASE EXCESS: 7 mmol/L — AB (ref 0.0–3.0)
ACID-BASE EXCESS: 9.6 mmol/L — AB (ref 0.0–3.0)
ALLENS TEST (PASS/FAIL): POSITIVE — AB
ALLENS TEST (PASS/FAIL): POSITIVE — AB
Acid-Base Excess: 7.4 mmol/L — ABNORMAL HIGH (ref 0.0–3.0)
Acid-Base Excess: 6.8 mmol/L — ABNORMAL HIGH (ref 0.0–3.0)
Allens test (pass/fail): POSITIVE — AB
BICARBONATE: 33.5 meq/L — AB (ref 21.0–28.0)
Bicarbonate: 32 mEq/L — ABNORMAL HIGH (ref 21.0–28.0)
Bicarbonate: 31.3 mEq/L — ABNORMAL HIGH (ref 21.0–28.0)
Bicarbonate: 31.3 mEq/L — ABNORMAL HIGH (ref 21.0–28.0)
FIO2: 0.4
FIO2: 0.4
FIO2: 0.4
FIO2: 0.5
MECHANICAL RATE: 16
O2 SAT: 85.8 %
O2 SAT: 94 %
O2 SAT: 93.8 %
O2 Saturation: 99.2 %
PATIENT TEMPERATURE: 37
PATIENT TEMPERATURE: 37
PCO2 ART: 44 mmHg (ref 32.0–48.0)
PH ART: 7.46 — AB (ref 7.350–7.450)
PO2 ART: 66 mmHg — AB (ref 83.0–108.0)
PO2 ART: 131 mmHg — AB (ref 83.0–108.0)
Patient temperature: 37
Patient temperature: 37
VT: 420 mL
pCO2 arterial: 45 mmHg (ref 32.0–48.0)
pCO2 arterial: 43 mmHg (ref 32.0–48.0)
pCO2 arterial: 42 mmHg (ref 32.0–48.0)
pH, Arterial: 7.46 — ABNORMAL HIGH (ref 7.350–7.450)
pH, Arterial: 7.47 — ABNORMAL HIGH (ref 7.350–7.450)
pH, Arterial: 7.51 — ABNORMAL HIGH (ref 7.350–7.450)
pO2, Arterial: 48 mmHg — ABNORMAL LOW (ref 83.0–108.0)
pO2, Arterial: 66 mmHg — ABNORMAL LOW (ref 83.0–108.0)

## 2015-01-04 LAB — CBC
HEMATOCRIT: 24 % — AB (ref 35.0–47.0)
Hemoglobin: 7.6 g/dL — ABNORMAL LOW (ref 12.0–16.0)
MCH: 28.8 pg (ref 26.0–34.0)
MCHC: 31.7 g/dL — AB (ref 32.0–36.0)
MCV: 90.8 fL (ref 80.0–100.0)
Platelets: 347 10*3/uL (ref 150–440)
RBC: 2.64 MIL/uL — ABNORMAL LOW (ref 3.80–5.20)
RDW: 14.7 % — AB (ref 11.5–14.5)
WBC: 9.8 10*3/uL (ref 3.6–11.0)

## 2015-01-04 LAB — CULTURE, BLOOD (ROUTINE X 2)
CULTURE: NO GROWTH
CULTURE: NO GROWTH

## 2015-01-04 LAB — BASIC METABOLIC PANEL
ANION GAP: 8 (ref 5–15)
BUN: 23 mg/dL — AB (ref 6–20)
CO2: 29 mmol/L (ref 22–32)
Calcium: 8.1 mg/dL — ABNORMAL LOW (ref 8.9–10.3)
Chloride: 107 mmol/L (ref 101–111)
Creatinine, Ser: 0.68 mg/dL (ref 0.44–1.00)
GFR calc Af Amer: >60 mL/min (ref >60)
GFR calc non Af Amer: >60 mL/min (ref >60)
Glucose, Bld: 197 mg/dL — ABNORMAL HIGH (ref 65–99)
Potassium: 3.5 mmol/L (ref 3.5–5.1)
Sodium: 144 mmol/L (ref 135–145)

## 2015-01-04 LAB — AMMONIA: AMMONIA: 66 umol/L — AB (ref 9–35)

## 2015-01-04 MED ORDER — VECURONIUM BROMIDE 10 MG IV SOLR
INTRAVENOUS | Status: AC
  Administered 2015-01-04: 18:00:00
  Filled 2015-01-04: qty 10

## 2015-01-04 MED ORDER — FUROSEMIDE 10 MG/ML IJ SOLN
INTRAMUSCULAR | Status: AC
  Filled 2015-01-04: qty 4

## 2015-01-04 MED ORDER — POTASSIUM CHLORIDE 10 MEQ/100ML IV SOLN
10.0000 meq | INTRAVENOUS | Status: AC
  Administered 2015-01-04 (×2): 10 meq via INTRAVENOUS
  Filled 2015-01-04 (×2): qty 100

## 2015-01-04 MED ORDER — DEXMEDETOMIDINE HCL IN NACL 200 MCG/50ML IV SOLN: 0.4000 ug/kg/h | INTRAVENOUS | Status: DC

## 2015-01-04 MED ORDER — IPRATROPIUM-ALBUTEROL 0.5-2.5 (3) MG/3ML IN SOLN
3.0000 mL | Freq: Four times a day (QID) | RESPIRATORY_TRACT | Status: DC
  Administered 2015-01-04 – 2015-01-10 (×22): 3 mL via RESPIRATORY_TRACT
  Filled 2015-01-04 (×21): qty 3

## 2015-01-04 MED ORDER — MIDAZOLAM HCL 2 MG/2ML IJ SOLN
2.0000 mg | INTRAMUSCULAR | Status: DC | PRN
  Administered 2015-01-04 – 2015-01-05 (×5): 2 mg via INTRAVENOUS
  Filled 2015-01-04 (×2): qty 2

## 2015-01-04 MED ORDER — MIDAZOLAM HCL 2 MG/2ML IJ SOLN
2.0000 mg | Freq: Once | INTRAMUSCULAR | Status: DC
  Filled 2015-01-04: qty 2

## 2015-01-04 MED ORDER — MIDAZOLAM HCL 2 MG/2ML IJ SOLN
INTRAMUSCULAR | Status: AC
  Administered 2015-01-04: 18:00:00
  Filled 2015-01-04: qty 2

## 2015-01-04 MED ORDER — VECURONIUM BROMIDE 10 MG IV SOLR
10.0000 mg | Freq: Once | INTRAVENOUS | Status: DC
  Filled 2015-01-04: qty 10

## 2015-01-04 MED ORDER — IPRATROPIUM-ALBUTEROL 0.5-2.5 (3) MG/3ML IN SOLN: 3.0000 mL | RESPIRATORY_TRACT | Status: DC | PRN

## 2015-01-04 MED ORDER — DEXMEDETOMIDINE HCL IN NACL 400 MCG/100ML IV SOLN
0.4000 ug/kg/h | INTRAVENOUS | Status: DC
  Administered 2015-01-04: 0.5 ug/kg/h via INTRAVENOUS
  Administered 2015-01-04 (×2): 1.2 ug/kg/h via INTRAVENOUS
  Filled 2015-01-04 (×4): qty 100

## 2015-01-04 MED ORDER — MIDAZOLAM HCL 2 MG/2ML IJ SOLN
2.0000 mg | INTRAMUSCULAR | Status: DC | PRN
  Administered 2015-01-04: 2 mg via INTRAVENOUS
  Filled 2015-01-04 (×4): qty 2

## 2015-01-04 MED ORDER — FENTANYL CITRATE (PF) 100 MCG/2ML IJ SOLN
100.0000 ug | Freq: Once | INTRAMUSCULAR | Status: DC
  Filled 2015-01-04: qty 2

## 2015-01-04 MED ORDER — FENTANYL CITRATE (PF) 100 MCG/2ML IJ SOLN
INTRAMUSCULAR | Status: AC
  Administered 2015-01-04: 18:00:00
  Filled 2015-01-04: qty 2

## 2015-01-04 MED ORDER — FUROSEMIDE 10 MG/ML IJ SOLN
40.0000 mg | Freq: Once | INTRAMUSCULAR | Status: AC
  Administered 2015-01-04: 40 mg via INTRAVENOUS

## 2015-01-04 MED ORDER — FENTANYL CITRATE (PF) 100 MCG/2ML IJ SOLN
100.0000 ug | Freq: Once | INTRAMUSCULAR | Status: AC
  Administered 2015-01-04: 100 ug via INTRAVENOUS

## 2015-01-04 NOTE — Progress Notes (Signed)
[facility] [place] [facility]   PATIENT NAME: Brandy Robinson    MR#:  [id number]  DATE OF BIRTH:  18-Dec-1965  SUBJECTIVE:  CHIEF COMPLAINT:   Chief Complaint  Patient presents with  . Abdominal Pain   -Patient admitted for perforated gastric ulcer and had surgery done. Postop course complicated by disorientation, tachycardia, hypoxia due to ARDS. - Remains very confused, restless in bed. Ammonia noted yesterday came elevated. Since nothing by mouth, trying lactulose enemas but patient not retaining. -Started on Precedex drip. -Off high flow nasal cannula and on 5 L oxygen now.  REVIEW OF SYSTEMS:  Review of Systems  Unable to perform ROS: mental status change    DRUG ALLERGIES:   Allergies  Allergen Reactions  . Hydrocodone Itching  . Aspirin Other (See Comments)    Reaction:  GI upset   . Morphine And Related Itching, Nausea And Vomiting and Other (See Comments)    Reaction:  Chest pain    VITALS:  Blood pressure 126/74, pulse 104, temperature 98.5 F (36.9 C), temperature source Axillary, resp. rate 27, height  (1.6 m), weight 75.9 kg (167 lb 5.3 oz), SpO2 92 %.  PHYSICAL EXAMINATION:  Physical Exam  GENERAL:  49 y.o.-year-old patient lying in the bed, moving restless.  EYES: Pupils equal, round, reactive to light and accommodation. No scleral icterus. Extraocular muscles intact.  HEENT: Head atraumatic, normocephalic. Oropharynx and nasopharynx clear.  NECK:  Supple, no jugular venous distention. No thyroid enlargement, no tenderness.  LUNGS: off of high flow nasal cannula. Decreased bibasilar breath sounds. On 5 L oxygen now. Otherwise moving air bilaterally, no wheezing, rales,rhonchi or crepitation. No use of accessory muscles of respiration.  CARDIOVASCULAR: S1, S2 normal. No murmurs, rubs, or gallops.  ABDOMEN: Abdomen is distended, recent surgeries with dressings and 2 JP drains in place. Tender diffusely. Hypoactive bowel  sounds are present.  EXTREMITIES: No pedal edema, cyanosis, or clubbing.  NEUROLOGIC: Cranial nerves II through XII are intact. Owing all extremities in bed. Following commands. Appears disoriented. Repeating the same words at times. Able to recognize family. Sensation intact. Gait not checked.  PSYCHIATRIC: The patient is alert and oriented to self.  SKIN: No obvious rash, lesion, or ulcer.    LABORATORY PANEL:   CBC  Recent Labs Lab 01/04/15 0429  WBC 9.8  HGB 7.6*  HCT 24.0*  PLT 347   ------------------------------------------------------------------------------------------------------------------  Chemistries   Recent Labs Lab 12/30/14 1444  01/04/15 0429  NA 132*  < > 144  K 4.0  < > 3.5  CL 97*  < > 107  CO2 20*  < > 29  GLUCOSE 99  < > 197*  BUN 24*  < > 23*  CREATININE 1.07*  < > 0.68  CALCIUM 8.7*  < > 8.1*  AST 12*  --   --   ALT 10*  --   --   ALKPHOS 131*  --   --   BILITOT 0.3  --   --   < > = values in this interval not displayed. ------------------------------------------------------------------------------------------------------------------  Cardiac Enzymes  Recent Labs Lab 12/30/14 1444  TROPONINI <0.03   ------------------------------------------------------------------------------------------------------------------  RADIOLOGY:  No results found.  EKG:   Orders placed or performed in visit on 04/23/11  . EKG 12-Lead    ASSESSMENT AND PLAN:   *Perforated gastric ulcer status post surgery 12/30/14. Management per primary team , on empiric antibiotics. -Remains nothing by mouth. NG tube taken out, still has  3 drains from the abdomen with decent drainage in 1 of them. -No bowel movement yet.  -Continue supportive management with fluids and pain medications  * Acute respi failure- hypoxic  X ray shows b/l Infiltrates Vs - edema- F/uU CXR today -Likely ARDS. Improving with Lasix and now on 5L o2. -Appreciate pulmonary  consult. -Echocardiogram with normal ejection fraction of 50-55%. - nebs prn -Also on IV steroids due to history of significant smoking. start tapering them.  * Tachycardia It is sinus tachycardia, and most likely driven by her pain. New pain control. -Likely might improve with Precedex drip  * UTI -urine cultures are negative. she is anyway on Zosyn for her intra-abdominal surgery.   * Anemia-acute postop anemia. Hemoglobin dropped from 10 to 7.5. Transfuse if hemoglobin less than 7. Continue to monitor.  * Gastric ulcer disease With chronic use of NSAIDS and Goody powders. continue Protonix IV twice a day, and advised to follow in GI clinic after a month.  * Depression and bipolar disorders Hold all oral medications at this point.  * Delirium versus metabolic encephalopathy-arousable, very disoriented and restless. -Agree with Precedex drip.  -Ammonia is elevated. Continue lactulose enemas until she can take lactulose by mouth. F/u ammonia again today -Continue to monitor. Neuro checks as needed.  All the records are reviewed and case discussed with Care Management/Social Workerr. Management plans discussed with the patient, family and they are in agreement.  CODE STATUS: Full Code  TOTAL CRITICAL CARE TIME SPENT IN TAKING CARE OF THIS PATIENT: 37 minutes.     Enid BaasKALISETTI,Ayra Hodgdon M.D on 01/04/2015 at 8:13 AM  Between 7am to 6pm - Pager - [phone]  After 6pm go to [email or web address] - password [email or web address]  [facility]  Office  [phone]  CC: Primary care physician; Pcp Not In System

## 2015-01-04 NOTE — Procedures (Signed)
Procedure Note:  Orotracheal Intubation  Implied consent due to emergent nature of patient's condition. Correct Patient, Name & ID confirmed.  The patient was pre-oxygenated and then, under direct visualization, a 7.5 mm cuffed endotracheal tube was placed through the vocal cords into the trachea, using the Glidescope.  Total attempts made 1, using Glidescope, mild dry secretions noted around the vocal cords.  During intubation an assistant applied gentle pressure to the cricoid cartilage.  Position confirmed by auscultation of lungs (good breath sounds bilaterally) and no stomach sounds.  Tube secured at 23 cm. Pulse ox 100 %. CO2 detector in place with appropriate color change.   Pt tolerated procedure well.  No complications were noted.   CXR ordered.   Stephanie AcreVishal Donita Newland, MD [facility] Pager [phone] On Call Pager [phone]

## 2015-01-04 NOTE — Progress Notes (Signed)
[facility] FOR AM LAB REPLACEMENT ONLY  The patient does apply for the [facility] Electrolyte [facility]plac[facility] Protocol based on the criteria listed below:   1. Is GFR >/= 40 ml/min? Yes.    Patient's GFR today is >60 2. Is urine output >/= 0.5 ml/kg/hr for the last 6 hours? Yes.   Patient's UOP is 0.8 ml/kg/hr 3. Is BUN < 60 mg/dL? Yes.    Patient's BUN today is 23 4. Abnormal electrolyte(s): K 3.5 5. Ordered repletion with: per protocol 6. If a panic level lab has been reported, has the CCM MD in charge been notified? No..   Physician:    Markus DaftWHELAN, Jowell Bossi A 01/04/2015 6:42 AM

## 2015-01-04 NOTE — Progress Notes (Addendum)
PULMONARY / CRITICAL CARE MEDICINE   Name: Brandy MackintoshKimberly C Robinson MRN: [id number] DOB: December 05, 1965    ADMISSION DATE:  12/30/2014  BRIEF HISTORY: 49 year old female past medical history of depression, anxiety, gastric reflux, insomnia, bipolar, takes ibuprofen and powders for pain at home. Admitted with severe abdominal pain, found to have free air in the peritoneum, taken today or for perforated gastric ulcer surgery with worsening confusion and hypotension, transferred to ICU with elevated heart rate, status post open laparotomy for perforated viscus..  SUBJECTIVE:  Overnight patient with severe agitation, noted to have elevated ammonia level over the last 2 days., Started on lactulose enema. This morning also started on ascitic strip which is helped her agitation significantly. Now mildly somnolent but easily awakened, on 5L    VITAL SIGNS: Temp:  [97.8 F (36.6 C)-98.5 F (36.9 C)] 98.5 F (36.9 C) (12/18 0750) Pulse Rate:  [87-113] 99 (12/18 0800) Resp:  [14-27] 16 (12/18 0800) BP: (85-130)/(25-97) 126/54 mmHg (12/18 0800) SpO2:  [89 %-99 %] 96 % (12/18 0800) FiO2 (%):  [40 %] 40 % (12/18 0758) HEMODYNAMICS:   VENTILATOR SETTINGS: Vent Mode:  [-]  FiO2 (%):  [40 %] 40 % INTAKE / OUTPUT:  Intake/Output Summary (Last 24 hours) at 01/04/15 [id number] Last data filed at 01/04/15 0700  Gross per 24 hour  Intake 1573.75 ml  Output    838 ml  Net 735.75 ml    Review of Systems  Unable to perform ROS: mental acuity    Physical Exam  Constitutional: She is well-developed, well-nourished, and in no distress.  HENT:  Head: Normocephalic and atraumatic.  Right Ear: External ear normal.  Left Ear: External ear normal.  Nose: Nose normal.  Mouth/Throat: Oropharynx is clear and moist.  Eyes: Conjunctivae and EOM are normal. Pupils are equal, round, and reactive to light. Right eye exhibits no discharge. Left eye exhibits no discharge.  Neck: Normal range of motion. Neck supple.   Cardiovascular: Normal rate, regular rhythm, normal heart sounds and intact distal pulses.  Exam reveals no friction rub.   No murmur heard. Pulmonary/Chest: Effort normal and breath sounds normal. No respiratory distress. She has no wheezes. She has no rales.  Abdominal: She exhibits distension. There is tenderness. There is no rebound.  Incision intact, no purulent drainage, bandages in place  Neurological:  Confused, altered mental status  Skin: Skin is warm and dry.  Nursing note and vitals reviewed.    LABS:  CBC  Recent Labs Lab 01/02/15 0501 01/03/15 0611 01/04/15 0429  WBC 14.0* 7.1 9.8  HGB 7.9* 8.0* 7.6*  HCT 24.5* 25.6* 24.0*  PLT 329 327 347   Coag's  Recent Labs Lab 12/31/14 0659 01/02/15 0501  INR 1.47 1.19   BMET  Recent Labs Lab 01/02/15 0501 01/03/15 0611 01/04/15 0429  NA 138 139 144  K 3.7 4.0 3.5  CL 95* 103 107  CO2 34* 32 29  BUN 10 13 23*  CREATININE 0.68 0.69 0.68  GLUCOSE 131* 173* 197*   Electrolytes  Recent Labs Lab 01/02/15 0501 01/03/15 0611 01/04/15 0429  CALCIUM 7.9* 8.2* 8.1*   Sepsis Markers  Recent Labs Lab 12/30/14 1737  LATICACIDVEN 2.6*   ABG No results for input(s): PHART, PCO2ART, PO2ART in the last 168 hours. Liver Enzymes  Recent Labs Lab 12/30/14 1444  AST 12*  ALT 10*  ALKPHOS 131*  BILITOT 0.3  ALBUMIN 2.6*   Cardiac Enzymes  Recent Labs Lab 12/30/14 1444  TROPONINI <0.03   Glucose  Recent Labs Lab 12/31/14 0546 12/31/14 0754 12/31/14 1609  GLUCAP 81 79 82    Imaging Dg Chest Port 1 View  01/04/2015  CLINICAL DATA:  Congestive failure EXAM: PORTABLE CHEST - 1 VIEW COMPARISON:  01/02/2015 FINDINGS: Cardiac shadow is within normal limits. Diffuse central vascular congestion and parenchymal edema is noted consistent with CHF. No bony abnormality is noted. IMPRESSION: CHF Electronically Signed   By: Alcide Clever M.D.   On: 01/04/2015 08:45    LINES:   CULTURES: Bld Cx x 2  12/14>>neg to date  ANTIBIOTICS Zosyn 12/14>> Vanc 12/14  ASSESSMENT / PLAN:  49 yo white female admitted to ICU for closer monitoring of BP/HR. likely cause of elevated HR and SOB likey from pain and abd distention with atelectasis, PE less likely at this time, CXR c/w pulm edema responded to lasix ECHO shows ef 50%  - wean oxygen as tolerated/needed, dounebs and pulmicort nebs - incentive spirometry - pain meds as needed - follow up surgery recs - NGT pulled 12/17. -12/18, started on precedex gtt due to severe agitation. Will also check head CT  Continue step down status  Thank you for consulting [facility], Please feel free to contacts [name] with any questions at [phone] (please enter 7-digits).  I have personally obtained a history, examined the patient, evaluated laboratory and imaging results, formulated the assessment and plan and placed orders.  The Patient requires high complexity decision making for assessment and support, frequent evaluation and titration of therapies, application of advanced monitoring technologies and extensive interpretation of multiple databases. Critical Care Time devoted to patient care services described in this note is 35 minutes.   Overall, patient is critically ill, prognosis is guarded. Patient at high risk for cardiac arrest and death.    Stephanie Acre, MD [facility] Pager [phone] (please enter 7-digits) On Call Pager - [phone] (please enter 7-digits)  Note: This note was prepared with Dragon dictation along with smaller phrase technology. Any transcriptional errors that result from this process are unintentional.

## 2015-01-04 NOTE — Plan of Care (Signed)
Problem: Education: Goal: Knowledge of [facility] Education information/materials will improve Outcome: Not Progressing Unable to educate due to confusion.  Problem: Safety: Goal: Ability to remain free from injury will improve Outcome: Progressing Precedex gtt added to help with agitation and restlessness and keep pt and her tubes and lines safe  Problem: Health Behavior/Discharge Planning: Goal: Ability to manage health-related needs will improve Outcome: Not Progressing Unable to address at present due to extreme confusion.  Problem: Pain Managment: Goal: General experience of comfort will improve Outcome: Progressing Pain seems responsive to 1-2mg  dilaudid  Problem: Physical Regulation: Goal: Ability to maintain clinical measurements within normal limits will improve Outcome: Not Progressing Pt with elevated ammonia level and possible withdrawal from bipolar meds..  It is difficult for this confused pt to retain a lactulose enema. ABG findings WNL.  Head CT showed no acute process. Throughout day pt increasingly tachypneic, confused ,restless. On precedex gtt.  Dr Dema SeverinMungal and surgeon discussed pt and decided intubation need in pt with continued tachypnea,wheezing, tiring, accessory muscle use, and periods of agitation. Intubated without problems by Dr Dema SeverinMungal. See Dr Courtney ParisMungal's note Goal: Will remain free from infection Outcome: Progressing Afebrile. Sputum specimen for cx obtained at time of intubation  Problem: Tissue Perfusion: Goal: Risk factors for ineffective tissue perfusion will decrease Outcome: Progressing Abdominal incision approximated with staples intact. VSS. UOP much improved with IV lasix.  Problem: Activity: Goal: Risk for activity intolerance will decrease Outcome: Progressing Pt resp status declined and tired toward end of day. Required intubation. She moves all extremities.  Periods of agitation despite precedex gtt and prn dilaudid

## 2015-01-04 NOTE — Progress Notes (Signed)
Update - review of KUB and recent ABG with no acute findings. - patient clinically worsening, still with severe agitation with wean of precedex gtt, now also with diffuse wheezing and abdomino-thoraco breathing pattern, RR in the 30s -husband informed of plan of care -patient with possible benzo withdrawal (hx of Bipolar depression, anxiety on multiple benzos prior to admission).   Plan - intubation, sedation - cont with precedex gtt, prn versed - restart benzos at low dose    Critical care time - 35mins  Stephanie AcreVishal Cairo Lingenfelter, MD [facility] Pager [phone] (please enter 7-digits) On Call Pager - [phone] (please enter 7-digits)

## 2015-01-04 NOTE — Progress Notes (Signed)
Patient intubated by Dr. Dema SeverinMungal with 7.5 ETT on first attempt.  Patient tube at 23 at lip and secured, placed patient on Servo vent, will continue to monitor.

## 2015-01-04 NOTE — Progress Notes (Signed)
5 Days Post-Op   Subjective:  Patient remains in ICU. She tolerated removal for NG tube yesterday without difficulty. Drain output has not increased. Patient had agitation overnight which was managed with a Precedex drip. She was found to have elevated ammonia and started on lactulose enemas. She continues to be somnolent and will not interact with me verbally.  Vital signs in last 24 hours: Temp:  [97.8 F (36.6 C)-98.5 F (36.9 C)] 98.5 F (36.9 C) (12/18 0750) Pulse Rate:  [70-113] 81 (12/18 1000) Resp:  [14-35] 35 (12/18 1000) BP: (85-130)/(25-97) 107/90 mmHg (12/18 1000) SpO2:  [89 %-99 %] 96 % (12/18 1000) FiO2 (%):  [40 %] 40 % (12/18 0758) Last BM Date: 12/30/14  Intake/Output from previous day: 12/17 0701 - 12/18 0700 In: 1723.8 [I.V.:1573.8; IV Piggyback:150] Out: 1163 [Urine:1095; Drains:68]  Physical exam: Gen.: Somnolent but arousable Chest: Clear to auscultation Heart: Regular rate GI: Abdomen is soft, not appreciably tender, well approximated midline incision, multiple JP drains in place all draining serous fluid.  Lab Results:  CBC  Recent Labs  01/03/15 0611 01/04/15 0429  WBC 7.1 9.8  HGB 8.0* 7.6*  HCT 25.6* 24.0*  PLT 327 347   CMP     Component Value Date/Time   NA 144 01/04/2015 0429   NA 139 05/15/2014 2120   K 3.5 01/04/2015 0429   K 4.0 05/15/2014 2120   CL 107 01/04/2015 0429   CL 110 05/15/2014 2120   CO2 29 01/04/2015 0429   CO2 22 05/15/2014 2120   GLUCOSE 197* 01/04/2015 0429   GLUCOSE 88 05/15/2014 2120   BUN 23* 01/04/2015 0429   BUN 10 05/15/2014 2120   CREATININE 0.68 01/04/2015 0429   CREATININE 0.78 05/15/2014 2120   CALCIUM 8.1* 01/04/2015 0429   CALCIUM 9.3 05/15/2014 2120   PROT 6.3* 12/30/2014 1444   PROT 7.2 05/15/2014 2120   ALBUMIN 2.6* 12/30/2014 1444   ALBUMIN 3.7 05/15/2014 2120   AST 12* 12/30/2014 1444   AST 17 05/15/2014 2120   ALT 10* 12/30/2014 1444   ALT 11* 05/15/2014 2120   ALKPHOS 131* 12/30/2014  1444   ALKPHOS 103 05/15/2014 2120   BILITOT 0.3 12/30/2014 1444   BILITOT 0.4 05/15/2014 2120   GFRNONAA >60 01/04/2015 0429   GFRNONAA >60 05/15/2014 2120   GFRNONAA >60 04/25/2011 0755   GFRAA >60 01/04/2015 0429   GFRAA >60 05/15/2014 2120   GFRAA >60 04/25/2011 0755   PT/INR  Recent Labs  01/02/15 0501  LABPROT 15.3*  INR 1.19    Studies/Results: Dg Chest Port 1 View  01/04/2015  CLINICAL DATA:  Congestive failure EXAM: PORTABLE CHEST - 1 VIEW COMPARISON:  01/02/2015 FINDINGS: Cardiac shadow is within normal limits. Diffuse central vascular congestion and parenchymal edema is noted consistent with CHF. No bony abnormality is noted. IMPRESSION: CHF Electronically Signed   By: Alcide CleverMark  Lukens M.D.   On: 01/04/2015 08:45    Assessment/Plan: 49 year old female postop day 5 status post Cheree DittoGraham patch repair of perforated gastric ulcer. Has tolerated removal of her NG tube. Would be acceptable to start a liquid diet today. Continue JP drains until after diet is advanced. Appreciate critical care assistance with patient's altered mental status. Difficult to ascertain whether that is from hypoxia versus liver dysfunction.   Ricarda Frameharles Chrisha Vogel, MD FACS General Surgeon  01/04/2015

## 2015-01-05 ENCOUNTER — Inpatient Hospital Stay: Payer: Self-pay

## 2015-01-05 DIAGNOSIS — R6520 Severe sepsis without septic shock: Secondary | ICD-10-CM

## 2015-01-05 DIAGNOSIS — A419 Sepsis, unspecified organism: Secondary | ICD-10-CM

## 2015-01-05 DIAGNOSIS — R918 Other nonspecific abnormal finding of lung field: Secondary | ICD-10-CM

## 2015-01-05 DIAGNOSIS — R410 Disorientation, unspecified: Secondary | ICD-10-CM

## 2015-01-05 LAB — BASIC METABOLIC PANEL
ANION GAP: 7 (ref 5–15)
BUN: 36 mg/dL — ABNORMAL HIGH (ref 6–20)
CO2: 29 mmol/L (ref 22–32)
Calcium: 7.7 mg/dL — ABNORMAL LOW (ref 8.9–10.3)
Chloride: 109 mmol/L (ref 101–111)
Creatinine, Ser: 0.8 mg/dL (ref 0.44–1.00)
GFR calc Af Amer: >60 mL/min (ref >60)
GFR calc non Af Amer: >60 mL/min (ref >60)
GLUCOSE: 296 mg/dL — AB (ref 65–99)
POTASSIUM: 3.3 mmol/L — AB (ref 3.5–5.1)
Sodium: 145 mmol/L (ref 135–145)

## 2015-01-05 LAB — CBC
HEMATOCRIT: 23.3 % — AB (ref 35.0–47.0)
HEMOGLOBIN: 7.4 g/dL — AB (ref 12.0–16.0)
MCH: 28.6 pg (ref 26.0–34.0)
MCHC: 31.7 g/dL — ABNORMAL LOW (ref 32.0–36.0)
MCV: 90.1 fL (ref 80.0–100.0)
Platelets: 299 10*3/uL (ref 150–440)
RBC: 2.58 MIL/uL — AB (ref 3.80–5.20)
RDW: 15.1 % — ABNORMAL HIGH (ref 11.5–14.5)
WBC: 8.4 10*3/uL (ref 3.6–11.0)

## 2015-01-05 LAB — AMMONIA: Ammonia: 75 umol/L — ABNORMAL HIGH (ref 9–35)

## 2015-01-05 LAB — GLUCOSE, CAPILLARY
GLUCOSE-CAPILLARY: 194 mg/dL — AB (ref 65–99)
GLUCOSE-CAPILLARY: 181 mg/dL — AB (ref 65–99)
Glucose-Capillary: 150 mg/dL — ABNORMAL HIGH (ref 65–99)

## 2015-01-05 MED ORDER — CLONAZEPAM 0.5 MG PO TABS
1.0000 mg | ORAL_TABLET | Freq: Two times a day (BID) | ORAL | Status: DC
  Administered 2015-01-05 – 2015-01-08 (×8): 1 mg
  Filled 2015-01-05 (×9): qty 2

## 2015-01-05 MED ORDER — QUETIAPINE FUMARATE 100 MG PO TABS
100.0000 mg | ORAL_TABLET | Freq: Two times a day (BID) | ORAL | Status: DC
  Administered 2015-01-05 – 2015-01-07 (×5): 100 mg
  Filled 2015-01-05 (×5): qty 1

## 2015-01-05 MED ORDER — PROPOFOL 1000 MG/100ML IV EMUL
INTRAVENOUS | Status: AC
  Filled 2015-01-05: qty 100

## 2015-01-05 MED ORDER — VITAL HIGH PROTEIN PO LIQD
1000.0000 mL | ORAL | Status: DC
  Administered 2015-01-05: 1000 mL

## 2015-01-05 MED ORDER — FENTANYL CITRATE (PF) 100 MCG/2ML IJ SOLN
25.0000 ug | INTRAMUSCULAR | Status: DC | PRN
  Administered 2015-01-05 – 2015-01-09 (×6): 50 ug via INTRAVENOUS
  Administered 2015-01-09: 100 ug via INTRAVENOUS
  Filled 2015-01-05 (×8): qty 2

## 2015-01-05 MED ORDER — LACTULOSE 10 GM/15ML PO SOLN
30.0000 g | Freq: Two times a day (BID) | ORAL | Status: DC
  Administered 2015-01-05 – 2015-01-08 (×8): 30 g
  Filled 2015-01-05 (×9): qty 60

## 2015-01-05 MED ORDER — FENTANYL 2500MCG IN NS 250ML (10MCG/ML) PREMIX INFUSION
INTRAVENOUS | Status: AC
  Administered 2015-01-05: 100 ug/h via INTRAVENOUS
  Filled 2015-01-05: qty 250

## 2015-01-05 MED ORDER — FENTANYL 2500MCG IN NS 250ML (10MCG/ML) PREMIX INFUSION
0.0000 ug/h | INTRAVENOUS | Status: DC
  Administered 2015-01-05: 100 ug/h via INTRAVENOUS
  Filled 2015-01-05 (×2): qty 250

## 2015-01-05 MED ORDER — INSULIN ASPART 100 UNIT/ML ~~LOC~~ SOLN: 0.0000 [IU] | Freq: Three times a day (TID) | SUBCUTANEOUS | Status: DC

## 2015-01-05 MED ORDER — BUSPIRONE HCL 5 MG PO TABS
10.0000 mg | ORAL_TABLET | Freq: Three times a day (TID) | ORAL | Status: DC
  Administered 2015-01-05 – 2015-01-09 (×14): 10 mg
  Filled 2015-01-05 (×15): qty 2

## 2015-01-05 MED ORDER — POTASSIUM CHLORIDE 10 MEQ/100ML IV SOLN
10.0000 meq | INTRAVENOUS | Status: AC
  Administered 2015-01-05 (×2): 10 meq via INTRAVENOUS
  Filled 2015-01-05 (×2): qty 100

## 2015-01-05 MED ORDER — KCL IN DEXTROSE-NACL 40-5-0.45 MEQ/L-%-% IV SOLN
INTRAVENOUS | Status: DC
  Administered 2015-01-05: 12:00:00 via INTRAVENOUS
  Filled 2015-01-05 (×3): qty 1000

## 2015-01-05 MED ORDER — VECURONIUM BROMIDE 10 MG IV SOLR
5.0000 mg | Freq: Once | INTRAVENOUS | Status: AC
  Administered 2015-01-05: 5 mg via INTRAVENOUS

## 2015-01-05 MED ORDER — FREE WATER
100.0000 mL | Freq: Every day | Status: DC
  Administered 2015-01-05 – 2015-01-09 (×22): 100 mL

## 2015-01-05 MED ORDER — PANTOPRAZOLE SODIUM 40 MG IV SOLR
40.0000 mg | INTRAVENOUS | Status: DC
  Administered 2015-01-06 – 2015-01-09 (×4): 40 mg via INTRAVENOUS
  Filled 2015-01-05 (×4): qty 40

## 2015-01-05 MED ORDER — INSULIN ASPART 100 UNIT/ML ~~LOC~~ SOLN: 0.0000 [IU] | Freq: Every day | SUBCUTANEOUS | Status: DC

## 2015-01-05 MED ORDER — CITALOPRAM HYDROBROMIDE 20 MG PO TABS
10.0000 mg | ORAL_TABLET | Freq: Every day | ORAL | Status: DC
  Administered 2015-01-05 – 2015-01-09 (×5): 10 mg
  Filled 2015-01-05 (×3): qty 1
  Filled 2015-01-05: qty 2
  Filled 2015-01-05: qty 1

## 2015-01-05 MED ORDER — INSULIN ASPART 100 UNIT/ML ~~LOC~~ SOLN
0.0000 [IU] | SUBCUTANEOUS | Status: DC
  Administered 2015-01-05: 2 [IU] via SUBCUTANEOUS
  Administered 2015-01-05 (×2): 3 [IU] via SUBCUTANEOUS
  Administered 2015-01-06 (×3): 2 [IU] via SUBCUTANEOUS
  Administered 2015-01-06 (×2): 3 [IU] via SUBCUTANEOUS
  Administered 2015-01-06 – 2015-01-07 (×4): 2 [IU] via SUBCUTANEOUS
  Administered 2015-01-07: 3 [IU] via SUBCUTANEOUS
  Administered 2015-01-08 – 2015-01-10 (×10): 2 [IU] via SUBCUTANEOUS
  Administered 2015-01-10: 3 [IU] via SUBCUTANEOUS
  Filled 2015-01-05 (×2): qty 2
  Filled 2015-01-05: qty 3
  Filled 2015-01-05 (×2): qty 2
  Filled 2015-01-05 (×2): qty 3
  Filled 2015-01-05 (×2): qty 2
  Filled 2015-01-05: qty 3
  Filled 2015-01-05 (×4): qty 2
  Filled 2015-01-05: qty 3
  Filled 2015-01-05 (×5): qty 2
  Filled 2015-01-05: qty 3
  Filled 2015-01-05 (×4): qty 2

## 2015-01-05 MED ORDER — LORAZEPAM 2 MG/ML IJ SOLN
0.5000 mg | INTRAMUSCULAR | Status: DC | PRN
Start: 2015-01-05 — End: 2015-01-12
  Administered 2015-01-05: 1 mg via INTRAVENOUS
  Filled 2015-01-05: qty 1

## 2015-01-05 MED ORDER — PROPOFOL 1000 MG/100ML IV EMUL
5.0000 ug/kg/min | INTRAVENOUS | Status: DC
  Administered 2015-01-05: 70 ug/kg/min via INTRAVENOUS
  Administered 2015-01-05 (×2): 60 ug/kg/min via INTRAVENOUS
  Administered 2015-01-05: 15 ug/kg/min via INTRAVENOUS
  Administered 2015-01-05: 60 ug/kg/min via INTRAVENOUS
  Filled 2015-01-05 (×6): qty 100

## 2015-01-05 MED ORDER — VITAL HIGH PROTEIN PO LIQD: 1000.0000 mL | ORAL | Status: DC

## 2015-01-05 MED ORDER — BUPROPION HCL 100 MG PO TABS
100.0000 mg | ORAL_TABLET | Freq: Three times a day (TID) | ORAL | Status: DC
  Administered 2015-01-05 – 2015-01-07 (×6): 100 mg
  Filled 2015-01-05 (×8): qty 1

## 2015-01-05 NOTE — Progress Notes (Signed)
[facility] [place] [facility]   PATIENT NAME: Brandy PyleKimberly Robinson    MR#:  [id number]  DATE OF BIRTH:  09/09/1965  SUBJECTIVE:  CHIEF COMPLAINT:   Chief Complaint  Patient presents with  . Abdominal Pain   -Patient admitted for perforated gastric ulcer and had surgery done. Postop course complicated by disorientation, tachycardia, hypoxia due to ARDS. - Remains very confused, restless in bed. Ammonia noted yesterday came elevated. Since nothing by mouth, trying lactulose enemas but patient not retaining. -Started on Precedex drip. -Off high flow nasal cannula and on 5 L oxygen now.  REVIEW OF SYSTEMS:  Review of Systems  Unable to perform ROS: critical illness    DRUG ALLERGIES:   Allergies  Allergen Reactions  . Hydrocodone Itching  . Aspirin Other (See Comments)    Reaction:  GI upset   . Morphine And Related Itching, Nausea And Vomiting and Other (See Comments)    Reaction:  Chest pain    VITALS:  Blood pressure 113/51, pulse 71, temperature 97.6 F (36.4 C), temperature source Axillary, resp. rate 29, height 5\' 3"  (1.6 m), weight 75.9 kg (167 lb 5.3 oz), SpO2 100 %.  PHYSICAL EXAMINATION:  Physical Exam  GENERAL:  49 y.o.-year-old patient lying in the bed, moving restless.  EYES: Pupils equal, round, reactive to light and accommodation. No scleral icterus. Extraocular muscles intact.  HEENT: Head atraumatic, normocephalic. Oropharynx and nasopharynx clear.  NECK:  Supple, no jugular venous distention. No thyroid enlargement, no tenderness.  LUNGS: off of high flow nasal cannula. Decreased bibasilar breath sounds. On 5 L oxygen now. Otherwise moving air bilaterally, no wheezing, rales,rhonchi or crepitation. No use of accessory muscles of respiration.  CARDIOVASCULAR: S1, S2 normal. No murmurs, rubs, or gallops.  ABDOMEN: Abdomen is distended, recent surgeries with dressings and 2 JP drains in place. Tender diffusely. Hypoactive bowel sounds  are present.  EXTREMITIES: No pedal edema, cyanosis, or clubbing.  NEUROLOGIC: Cranial nerves II through XII are intact. Owing all extremities in bed. Following commands. Appears disoriented. Repeating the same words at times. Able to recognize family. Sensation intact. Gait not checked.  PSYCHIATRIC: The patient is alert and oriented to self.  SKIN: No obvious rash, lesion, or ulcer.    LABORATORY PANEL:   CBC  Recent Labs Lab 01/05/15 0521  WBC 8.4  HGB 7.4*  HCT 23.3*  PLT 299   ------------------------------------------------------------------------------------------------------------------  Chemistries   Recent Labs Lab 12/30/14 1444  01/05/15 0521  NA 132*  < > 145  K 4.0  < > 3.3*  CL 97*  < > 109  CO2 20*  < > 29  GLUCOSE 99  < > 296*  BUN 24*  < > 36*  CREATININE 1.07*  < > 0.80  CALCIUM 8.7*  < > 7.7*  AST 12*  --   --   ALT 10*  --   --   ALKPHOS 131*  --   --   BILITOT 0.3  --   --   < > = values in this interval not displayed. ------------------------------------------------------------------------------------------------------------------  Cardiac Enzymes  Recent Labs Lab 12/30/14 1444  TROPONINI <0.03   ------------------------------------------------------------------------------------------------------------------  RADIOLOGY:  Dg Abd 1 View  01/05/2015  CLINICAL DATA:  49 year old female with nasogastric tube placement. Subsequent encounter. EXAM: ABDOMEN - 1 VIEW COMPARISON:  01/04/2015. FINDINGS: Multiple overlying leads and surgical drainage catheters are in place. Nasogastric tube side hole just beyond the gastroesophageal junction with the tip of the nasogastric tube  at the gastric body level (greater curvature). Gas-filled nondilated stomach. Basilar atelectasis greater on the left. IMPRESSION: Nasogastric tube tip gastric body level with the side hole just beyond the gastroesophageal junction. Electronically Signed   By: Lacy Duverney M.D.    On: 01/05/2015 10:43   Dg Abd 1 View  01/04/2015  CLINICAL DATA:  49 year old female with OG tube placement history of gastroesophageal reflux disease. EXAM: ABDOMEN - 1 VIEW COMPARISON:  Radiograph dated 53 of FINDINGS: Partially visualized enteric tube is noted over the lower esophagus likely at the gastroesophageal junction. Recommend advancement of the tube into the stomach. Three drainage tubes are again identified in the visualized abdomen. Similar to prior study. No evidence of bowel obstruction. The osseous structures are grossly unremarkable. IMPRESSION: Enteric tube is partially visualized and appears over the distal esophagus or gastroesophageal junction. Recommend advancement into the stomach. Electronically Signed   By: Elgie Collard M.D.   On: 01/04/2015 19:15   Dg Abd 1 View  01/04/2015  CLINICAL DATA:  Status post orogastric tube placement. EXAM: ABDOMEN - 1 VIEW COMPARISON:  01/04/2015 at 1617 hours FINDINGS: The tip of the orogastric tube projects in the distal esophagus. It will need to be inserted at least 10 cm to allow the tube to extend well into stomach. No other change from prior study. Postsurgical changes are again noted. IMPRESSION: Orogastric tube tip projects in the distal esophagus and does not enter the stomach. Electronically Signed   By: Amie Portland M.D.   On: 01/04/2015 19:09   Dg Abd 1 View  01/04/2015  CLINICAL DATA:  Patient in CCU. Interval removal of the NG tube. Abdominal distention. EXAM: ABDOMEN - 1 VIEW COMPARISON:  12/30/2014 FINDINGS: Surgical drains within the central abdomen and left abdomen are identified. Skin staples are noted along the midline of the abdomen. Mild gaseous distension of large bowel loops noted. No abnormal small bowel dilatation identified. IMPRESSION: 1. No evidence for bowel obstruction. 2. Mild gaseous distension of large bowel loops. Electronically Signed   By: Signa Kell M.D.   On: 01/04/2015 16:54   Ct Head Wo  Contrast  01/04/2015  CLINICAL DATA:  Altered mental status.  Elevated ammonia levels. EXAM: CT HEAD WITHOUT CONTRAST TECHNIQUE: Contiguous axial images were obtained from the base of the skull through the vertex without intravenous contrast. COMPARISON:  Report of CT head without contrast 03/16/2002 FINDINGS: Basal ganglia are intact bilaterally. The insular ribbon is normal. No acute cortical infarcts are present. Mild periventricular white matter changes are noted bilaterally. Mild mucosal thickening is present in the right maxillary sinus. The remaining paranasal sinuses and the mastoid air cells are clear. IMPRESSION: 1. Mild periventricular white matter changes bilaterally. This is nonspecific, but may represent microvascular ischemic change. 2. No acute intracranial abnormality. 3. Minimal right maxillary sinus disease. Electronically Signed   By: Marin Roberts M.D.   On: 01/04/2015 15:55   Dg Chest Port 1 View  01/04/2015  CLINICAL DATA:  49 year old female with respiratory failure and ET tube placement. EXAM: PORTABLE CHEST 1 VIEW COMPARISON:  Radiograph dated 01/04/2015 FINDINGS: There has been interval placement of an endotracheal tube with tip approximately 1.5 cm above the carina. Recommend retraction and repositioning of the tube by approximately 4 cm for optimal positioning. An endotracheal tube is partially visualized coursing to the upper abdomen. The tip of the endotracheal tube is poorly visualized but appears to be over the lower mediastinum likely at the gastroesophageal junction. Recommend advancing the tube  into the stomach and follow-up imaging to confirm positioning. Diffuse central vascular prominence as well as the areas of central airspace opacity noted. A small left pleural effusion may be present. There is no pneumothorax. The cardiac silhouette is within normal limits. The osseous structures are grossly unremarkable. IMPRESSION: Interval placement of an endotracheal and  enteric tube. Recommend retraction of the endotracheal tube by approximately 4 cm and advancement of the enteric tube into the stomach for optimal positioning. Central vascular congestion and pulmonary opacities. Electronically Signed   By: Elgie Collard M.D.   On: 01/04/2015 19:14   Dg Chest Port 1 View  01/04/2015  CLINICAL DATA:  Congestive failure EXAM: PORTABLE CHEST - 1 VIEW COMPARISON:  01/02/2015 FINDINGS: Cardiac shadow is within normal limits. Diffuse central vascular congestion and parenchymal edema is noted consistent with CHF. No bony abnormality is noted. IMPRESSION: CHF Electronically Signed   By: Alcide Clever M.D.   On: 01/04/2015 08:45    EKG:   Orders placed or performed in visit on 04/23/11  . EKG 12-Lead    ASSESSMENT AND PLAN:   *Perforated gastric ulcer status post surgery 12/30/14. Management per primary team , on empiric antibiotics. - NG tube today and possible tube feeds - drains with minimal drainage -Continue supportive management with fluids and pain medications  * Acute respi failure- hypoxic  X ray shows b/l Infiltrates Vs - edema -Appreciate pulmonary consult. -Echocardiogram with normal ejection fraction of 50-55%. - Intubated again and on vent , on minimal vent settings, SBT every day  * UTI -urine cultures are negative. she is anyway on Zosyn for her intra-abdominal surgery.   * Anemia-acute postop anemia. Hemoglobin dropped from 10 to 7.5. Transfuse if hemoglobin less than 7. Continue to monitor.  * Gastric ulcer disease With chronic use of NSAIDS and Goody powders. continue Protonix IV twice a day, and advised to follow in GI clinic after a month.  * Depression and bipolar disorders Meds will be restarted- on wellbutyrin, celexa, buspar and klonopin  * Delirium versus metabolic encephalopathy-Intubated and on propofol drip. -Ammonia is elevated. Start lactulose by mouth. F/u ammonia again tomorrow -Continue to monitor.  Neuro checks as needed.  All the records are reviewed and case discussed with Care Management/Social Workerr. Management plans discussed with the patient, family and they are in agreement.  CODE STATUS: Full Code  TOTAL CRITICAL CARE TIME SPENT IN TAKING CARE OF THIS PATIENT: 37 minutes.     Enid Baas M.D on 01/05/2015 at 2:56 PM  Between 7am to 6pm - Pager - [phone]  After 6pm go to [email or web address] - password [email or web address]  [facility]  Office  [phone]  CC: Primary care physician; Pcp Not In System

## 2015-01-05 NOTE — Plan of Care (Signed)
Problem: Safety: Goal: Ability to remain free from injury will improve Outcome: Progressing Free from injury. Sedated with Propofol  Problem: Health Behavior/Discharge Planning: Goal: Ability to manage health-related needs will improve Outcome: Not Progressing Sedated on ventilator.  Problem: Pain Managment: Goal: General experience of comfort will improve Outcome: Progressing Prn dilaudid for pain  Problem: Physical Regulation: Goal: Ability to maintain clinical measurements within normal limits will improve Outcome: Progressing VSS. No pressors. UOP adequate. NGT was inserted and lactulose given via NG  with large loose stool result.. Ammonia level recheck in tomorrow am.  Home meds for bipolar disorder restarted and given. Very brief wakeup assessment performed due to pt biting ETT, tachypnea, agitation and bucking vent. She was unable to follow commands. Goal: Will remain free from infection Outcome: Progressing Afebrile. ETT secretions white. Lungs coarse to clear. Incision line without sign of infection. Serous drainage to JP drains.  Problem: Tissue Perfusion: Goal: Risk factors for ineffective tissue perfusion will decrease Outcome: Progressing Full leg SCD's. Consult surgery when lovenox appropriate.  Problem: Activity: Goal: Risk for activity intolerance will decrease Outcome: Not Progressing Any activity causes agitation, tachypnea and biting ett and bucking vent breaths.  Problem: Fluid Volume: Goal: Ability to maintain a balanced intake and output will improve Outcome: Progressing UOP adequate  Problem: Nutrition: Goal: Adequate nutrition will be maintained Outcome: Progressing Tube feeding started with water flushes  Problem: Bowel/Gastric: Goal: Will not experience complications related to bowel motility Outcome: Progressing 1 loose brown stool after lactulose

## 2015-01-05 NOTE — Progress Notes (Signed)
eLink Physician-Brief Progress Note Patient Name: Brandy MackintoshKimberly C Strother DOB: 11-02-1965 MRN: [id number]   Date of Service  01/05/2015  HPI/Events of Note  Patient is biting on tube. No improvement with Precedex IV infusion at 1.2 mcg/kg/min and Versed IV PRN  eICU Interventions  Will order: 1. Vecuronium 5 mg IV now. 2. D/C Precedex IV infusion. 3. Propofol IV infusion.      Intervention Category Minor Interventions: Agitation / anxiety - evaluation and management  Sommer,Steven Eugene 01/05/2015, 12:38 AM

## 2015-01-05 NOTE — Progress Notes (Signed)
PULMONARY / CRITICAL CARE MEDICINE   Name: Brandy Robinson MRN: [id number] DOB: January 17, 1966    ADMISSION DATE:  12/30/2014  BRIEF HISTORY: 49 year old female past medical history of depression, anxiety, gastric reflux, insomnia, bipolar, takes ibuprofen and powders for pain at home. Admitted with severe abdominal pain, found to have free air in the peritoneum, taken today or for perforated gastric ulcer surgery with worsening confusion and hypotension, transferred to ICU with elevated heart rate, status post open laparotomy for perforated viscus..  SUBJECTIVE:  Intubated yesterday afternoon. Intermittent severe agitation today. Pt's psych history and meds noted. RASS -2, -3 presently on propofol  VITAL SIGNS: Temp:  [97.6 F (36.4 C)-99.1 F (37.3 C)] 97.6 F (36.4 C) (12/19 1100) Pulse Rate:  [48-85] 71 (12/19 1400) Resp:  [16-36] 29 (12/19 1400) BP: (111-140)/(51-76) 113/51 mmHg (12/19 1400) SpO2:  [94 %-100 %] 100 % (12/19 1400) FiO2 (%):  [30 %-40 %] 40 % (12/19 1216) HEMODYNAMICS:   VENTILATOR SETTINGS: Vent Mode:  [-] PRVC FiO2 (%):  [30 %-40 %] 40 % Set Rate:  [16 bmp] 16 bmp Vt Set:  [420 mL] 420 mL PEEP:  [5 cmH20] 5 cmH20 Pressure Support:  [15 cmH20] 15 cmH20 Plateau Pressure:  [18 cmH20-23 cmH20] 18 cmH20 INTAKE / OUTPUT:  Intake/Output Summary (Last 24 hours) at 01/05/15 1434 Last data filed at 01/05/15 1100  Gross per 24 hour  Intake 2231.46 ml  Output   1840 ml  Net 391.46 ml    Review of Systems  Unable to perform ROS: mental acuity    Physical Exam  Constitutional: She is well-developed, well-nourished, and in no distress.  HENT:  Head: Normocephalic and atraumatic.  Eyes: Conjunctivae and EOM are normal. Pupils are equal, round, and reactive to light.  Neck: No JVD present. No thyromegaly present.  Cardiovascular: Normal rate, regular rhythm, normal heart sounds and intact distal pulses.  Exam reveals no friction rub.   No murmur  heard. Pulmonary/Chest: Breath sounds normal. She has no wheezes. She has no rales.  Abdominal: She exhibits distension. There is no rebound and no guarding.  Incision intact, no purulent drainage, bandages in place  Musculoskeletal: She exhibits no edema.  Lymphadenopathy:    She has no cervical adenopathy.  Neurological: She has normal reflexes. No cranial nerve deficit.  Confused, altered mental status  Skin: Skin is warm and dry.     LABS:  CBC  Recent Labs Lab 01/03/15 0611 01/04/15 0429 01/05/15 0521  WBC 7.1 9.8 8.4  HGB 8.0* 7.6* 7.4*  HCT 25.6* 24.0* 23.3*  PLT 327 347 299   Coag's  Recent Labs Lab 12/31/14 0659 01/02/15 0501  INR 1.47 1.19   BMET  Recent Labs Lab 01/03/15 0611 01/04/15 0429 01/05/15 0521  NA 139 144 145  K 4.0 3.5 3.3*  CL 103 107 109  CO2 32 29 29  BUN 13 23* 36*  CREATININE 0.69 0.68 0.80  GLUCOSE 173* 197* 296*   Electrolytes  Recent Labs Lab 01/03/15 0611 01/04/15 0429 01/05/15 0521  CALCIUM 8.2* 8.1* 7.7*   Sepsis Markers  Recent Labs Lab 12/30/14 1737  LATICACIDVEN 2.6*   ABG  Recent Labs Lab 01/04/15 1100 01/04/15 1610 01/04/15 2125  PHART 7.47* 7.46* 7.51*  PCO2ART 43 44 42  PO2ART 66* 66* 131*   Liver Enzymes  Recent Labs Lab 12/30/14 1444  AST 12*  ALT 10*  ALKPHOS 131*  BILITOT 0.3  ALBUMIN 2.6*   Cardiac Enzymes  Recent Labs Lab 12/30/14  1444  TROPONINI <0.03   Glucose  Recent Labs Lab 12/31/14 0546 12/31/14 0754 12/31/14 1609 01/05/15 1148  GLUCAP 81 79 82 194*    Imaging Dg Abd 1 View  01/05/2015  CLINICAL DATA:  49 year old female with nasogastric tube placement. Subsequent encounter. EXAM: ABDOMEN - 1 VIEW COMPARISON:  01/04/2015. FINDINGS: Multiple overlying leads and surgical drainage catheters are in place. Nasogastric tube side hole just beyond the gastroesophageal junction with the tip of the nasogastric tube at the gastric body level (greater curvature).  Gas-filled nondilated stomach. Basilar atelectasis greater on the left. IMPRESSION: Nasogastric tube tip gastric body level with the side hole just beyond the gastroesophageal junction. Electronically Signed   By: Lacy DuverneySteven  Olson M.D.   On: 01/05/2015 10:43   Dg Abd 1 View  01/04/2015  CLINICAL DATA:  49 year old female with OG tube placement history of gastroesophageal reflux disease. EXAM: ABDOMEN - 1 VIEW COMPARISON:  Radiograph dated 4712 of FINDINGS: Partially visualized enteric tube is noted over the lower esophagus likely at the gastroesophageal junction. Recommend advancement of the tube into the stomach. Three drainage tubes are again identified in the visualized abdomen. Similar to prior study. No evidence of bowel obstruction. The osseous structures are grossly unremarkable. IMPRESSION: Enteric tube is partially visualized and appears over the distal esophagus or gastroesophageal junction. Recommend advancement into the stomach. Electronically Signed   By: Elgie CollardArash  Radparvar M.D.   On: 01/04/2015 19:15   Dg Abd 1 View  01/04/2015  CLINICAL DATA:  Status post orogastric tube placement. EXAM: ABDOMEN - 1 VIEW COMPARISON:  01/04/2015 at 1617 hours FINDINGS: The tip of the orogastric tube projects in the distal esophagus. It will need to be inserted at least 10 cm to allow the tube to extend well into stomach. No other change from prior study. Postsurgical changes are again noted. IMPRESSION: Orogastric tube tip projects in the distal esophagus and does not enter the stomach. Electronically Signed   By: Amie Portlandavid  Ormond M.D.   On: 01/04/2015 19:09   Dg Abd 1 View  01/04/2015  CLINICAL DATA:  Patient in CCU. Interval removal of the NG tube. Abdominal distention. EXAM: ABDOMEN - 1 VIEW COMPARISON:  12/30/2014 FINDINGS: Surgical drains within the central abdomen and left abdomen are identified. Skin staples are noted along the midline of the abdomen. Mild gaseous distension of large bowel loops noted. No  abnormal small bowel dilatation identified. IMPRESSION: 1. No evidence for bowel obstruction. 2. Mild gaseous distension of large bowel loops. Electronically Signed   By: Signa Kellaylor  Stroud M.D.   On: 01/04/2015 16:54   Dg Chest Port 1 View  01/04/2015  CLINICAL DATA:  49 year old female with respiratory failure and ET tube placement. EXAM: PORTABLE CHEST 1 VIEW COMPARISON:  Radiograph dated 01/04/2015 FINDINGS: There has been interval placement of an endotracheal tube with tip approximately 1.5 cm above the carina. Recommend retraction and repositioning of the tube by approximately 4 cm for optimal positioning. An endotracheal tube is partially visualized coursing to the upper abdomen. The tip of the endotracheal tube is poorly visualized but appears to be over the lower mediastinum likely at the gastroesophageal junction. Recommend advancing the tube into the stomach and follow-up imaging to confirm positioning. Diffuse central vascular prominence as well as the areas of central airspace opacity noted. A small left pleural effusion may be present. There is no pneumothorax. The cardiac silhouette is within normal limits. The osseous structures are grossly unremarkable. IMPRESSION: Interval placement of an endotracheal and enteric tube.  Recommend retraction of the endotracheal tube by approximately 4 cm and advancement of the enteric tube into the stomach for optimal positioning. Central vascular congestion and pulmonary opacities. Electronically Signed   By: Elgie Collard M.D.   On: 01/04/2015 19:14    LINES/TUBES: ETT 12/18 >>   MICRO: MRSA PCR 12/14 >> NEG Urine 12/13 >> NEG Blood 12/13 >> NEG resp 12/18 >>   ANTIBIOTICS Zosyn 12/14>>  ASSESSMENT / PLAN: Post laparotomy 12/13 for perforated gastric ulcer Acute resp failure - intubated for extreme agitation and concomitant respiratory distress Bilateral pulmonary infiltrates on CXR - edema vs PNA vs ALI/ARDS  Note normal LVEF on  Echocardiogram 12/15 Hypernatremia Hypokalemia Elevated ammonia level of unclear significance Severe sepsis, peritonitis Anemia without overt bleeding presently Bipolar disorder with multiple chronic psych meds Severe agitated delirium  Cont vent support - settings reviewed and/or adjusted Wean on PSV mode as tolerated Cont vent bundle Daily SBT if/when meets criteria Monitor BMET intermittently Monitor I/Os Correct electrolytes as indicated IVFs adjusted 12/19 SUP: IV pantoprazole NGT placed 12/19 Begin TFs today (OK per surgery note) DVT px: SCDs Monitor CBC intermittently Transfuse per usual guidelines Monitor temp, WBC count Micro and abx as above I have reviewed home meds - resume quetiapine, buspirone, bupropion Continue propofol - RASS goal -2 PRN lorazepam, fentanyl ordered Daily WUA   CCM time: 45 mins The above time includes time spent in consultation with patient and/or family members and reviewing care plan on multidisciplinary rounds  Billy Fischer, MD PCCM service Mobile [phone] Pager [phone]

## 2015-01-05 NOTE — Progress Notes (Signed)
ANTIBIOTIC CONSULT NOTE -follow up  Pharmacy Consult for Zosyn dosing Indication: intra-abdominal infection  Allergies  Allergen Reactions  . Hydrocodone Itching  . Aspirin Other (See Comments)    Reaction:  GI upset   . Morphine And Related Itching, Nausea And Vomiting and Other (See Comments)    Reaction:  Chest pain    Patient Measurements: Height: 5\' 3"  (160 cm) Weight: 167 lb 5.3 oz (75.9 kg) IBW/kg (Calculated) : 52.4 Adjusted Body Weight: n/a  Vital Signs: Temp: 97.6 F (36.4 C) (12/19 1100) Temp Source: Axillary (12/19 1100) BP: 123/58 mmHg (12/19 1100) Pulse Rate: 60 (12/19 1100) Intake/Output from previous day: 12/18 0701 - 12/19 0700 In: 2542.4 [I.V.:2117.4; IV Piggyback:350] Out: 2070 [Urine:1975; Drains:95] Intake/Output from this shift: Total I/O In: 527.3 [I.V.:327.3; IV Piggyback:200] Out: -   Labs:  Recent Labs  01/03/15 0611 01/04/15 0429 01/05/15 0521  WBC 7.1 9.8 8.4  HGB 8.0* 7.6* 7.4*  PLT 327 347 299  CREATININE 0.69 0.68 0.80   Estimated Creatinine Clearance: 84.8 mL/min (by C-G formula based on Cr of 0.8).   Microbiology: Recent Results (from the past 720 hour(s))  Blood Culture (routine x 2)     Status: None   Collection Time: 12/30/14  5:37 PM  Result Value Ref Range Status   Specimen Description BLOOD RIGHT ASSIST CONTROL  Final   Special Requests   Final    BOTTLES DRAWN AEROBIC AND ANAEROBIC  3CC AERO, 2 ANAERO   Culture NO GROWTH 5 DAYS  Final   Report Status 01/04/2015 FINAL  Final  Blood Culture (routine x 2)     Status: None   Collection Time: 12/30/14  5:40 PM  Result Value Ref Range Status   Specimen Description BLOOD LEFT HAND  Final   Special Requests   Final    BOTTLES DRAWN AEROBIC AND ANAEROBIC  3CC AERO, 2CC ANAERO   Culture NO GROWTH 5 DAYS  Final   Report Status 01/04/2015 FINAL  Final  Urine culture     Status: None   Collection Time: 12/30/14  7:45 PM  Result Value Ref Range Status   Specimen  Description URINE, RANDOM  Final   Special Requests NONE  Final   Culture NO GROWTH  Final   Report Status 01/01/2015 FINAL  Final  MRSA PCR Screening     Status: None   Collection Time: 12/31/14 12:24 PM  Result Value Ref Range Status   MRSA by PCR NEGATIVE NEGATIVE Final    Comment:        The GeneXpert MRSA Assay (FDA approved for NASAL specimens only), is one component of a comprehensive MRSA colonization surveillance program. It is not intended to diagnose MRSA infection nor to guide or monitor treatment for MRSA infections.   Culture, respiratory (NON-Expectorated)     Status: None (Preliminary result)   Collection Time: 01/04/15  6:22 PM  Result Value Ref Range Status   Specimen Description TRACHEAL ASPIRATE  Final   Special Requests Normal  Final   Gram Stain PENDING  Incomplete   Culture NO GROWTH < 24 HOURS  Final   Report Status PENDING  Incomplete    Medical History: Past Medical History  Diagnosis Date  . Wears glasses   . Depression   . Anxiety   . GERD (gastroesophageal reflux disease)   . Vaginal atrophy   . Lichen   . Surgical menopause   . Endometriosis   . Dyspareunia   . Insomnia   . Bipolar  disorder [facility])      Assessment: 49 y/o F s/p exploratory laparotomy for perforated gastric ulcer.   12/13 Bcx negative x 2 12/13 Ucx negative MRSA PCR Negative 12/18 TA NGTD  Plan:  Will continue Zosyn 3.375 g EI q 8 hours.   Luisa Hart, PharmD  Clinical Pharmacist 01/05/2015 1:06 PM

## 2015-01-05 NOTE — Progress Notes (Signed)
Initial Nutrition Assessment     INTERVENTION:  EN: Consult per Dr Egbert GaribaldiBird to start enteral nutrition.  Recommend vital high protein at goal rate of 3738ml/hr (secondary to diprivan providing 720 kcals/d at current rate).  Tube feeding will provide 912 kcals, 80 gm of protein and 766ml free water.  Minimal free water of 100ml 6 times daily.     NUTRITION DIAGNOSIS:   Inadequate oral intake related to acute illness, altered GI function as evidenced by NPO status.    GOAL:   Patient will meet greater than or equal to 90% of their needs    MONITOR:    (Energy Intake, Anthropometrics, Digestive System, Electrolyte/Renal Profile, Glucose Profile)  REASON FOR ASSESSMENT:   LOS    ASSESSMENT:      Pt re-intubated.  Pt s/p perforated viscus repair.     Current Nutrition: NPO day 6, starting tube feeding today   Gastrointestinal Profile: NG tube placed this am, KUB pending Last BM: 12/19   Scheduled Medications:  . antiseptic oral rinse  7 mL Mouth Rinse BID  . budesonide (PULMICORT) nebulizer solution  0.5 mg Nebulization BID  . buPROPion  100 mg Per Tube TID  . busPIRone  10 mg Per Tube TID  . citalopram  10 mg Per Tube QHS  . clonazePAM  1 mg Per Tube BID  . [START ON 01/06/2015] feeding supplement (VITAL HIGH PROTEIN)  1,000 mL Per Tube Q24H  . free water  100 mL Per Tube 6 X Daily  . insulin aspart  0-15 Units Subcutaneous 6 times per day  . ipratropium-albuterol  3 mL Nebulization Q6H  . lactulose  30 g Per Tube BID  . [START ON 01/06/2015] pantoprazole (PROTONIX) IV  40 mg Intravenous Q24H  . piperacillin-tazobactam (ZOSYN)  IV  3.375 g Intravenous 3 times per day  . QUEtiapine  100 mg Per Tube BID    Continuous Medications:  . dextrose 5 % and 0.45 % NaCl with KCl 40 mEq/L 75 mL/hr at 01/05/15 1220  . propofol (DIPRIVAN) infusion 60 mcg/kg/min (01/05/15 1144)     Electrolyte/Renal Profile and Glucose Profile:   Recent Labs Lab 01/03/15 0611  01/04/15 0429 01/05/15 0521  NA 139 144 145  K 4.0 3.5 3.3*  CL 103 107 109  CO2 32 29 29  BUN 13 23* 36*  CREATININE 0.69 0.68 0.80  CALCIUM 8.2* 8.1* 7.7*  GLUCOSE 173* 197* 296*   Protein Profile:   Recent Labs Lab 12/30/14 1444  ALBUMIN 2.6*     Nutrition-Focused Physical Exam Findings:  Unable to complete Nutrition-Focused physical exam at this time.     Weight Trend since Admission: Filed Weights   12/30/14 1441 01/01/15 0419  Weight: 159 lb 6.3 oz (72.3 kg) 167 lb 5.3 oz (75.9 kg)      Diet Order:   NPO  Skin:   reviewed   Height:   Ht Readings from Last 1 Encounters:  12/30/14 5\' 3"  (1.6 m)    Weight:   Wt Readings from Last 1 Encounters:  01/01/15 167 lb 5.3 oz (75.9 kg)    Ideal Body Weight:     BMI:  Body mass index is 29.65 kg/(m^2).  Estimated Nutritional Needs:   Kcal:  TEE 1645 kcals/d (Ve 10.3, T max37.3)   Protein:  (1.2-2.0 g/kg) 91-152 g/d  Fluid:  (25-3930ml/kg) 1900-226380ml/d  EDUCATION NEEDS:   No education needs identified at this time   HIGH Care Level  Chais Fehringer B. Freida BusmanAllen, RD, LDN  [phone] (pager)

## 2015-01-05 NOTE — Progress Notes (Signed)
Surgery  POD 6  S/P ex lap, graham patch closure gastric ulcer.  Re-intubated yesterday.   Remains sedated on propofol gtt.  Filed Vitals:   01/05/15 0600 01/05/15 0700 01/05/15 0718 01/05/15 0800  BP: 119/58 111/69  115/64  Pulse: 49 71 48 51  Temp:   98.3 F (36.8 C)   TempSrc:   Axillary   Resp: 22 22 22 20   Height:      Weight:      SpO2: 100% 99% 98% 100%    PE:  The patient is intubated. She appears well sedated.  Lungs are clear. Midline incision is clean. No bile is seen in drains.  Labs  CBC Latest Ref Rng 01/05/2015 01/04/2015 01/03/2015  WBC 3.6 - 11.0 K/uL 8.4 9.8 7.1  Hemoglobin 12.0 - 16.0 g/dL 7.4(L) 7.6(L) 8.0(L)  Hematocrit 35.0 - 47.0 % 23.3(L) 24.0(L) 25.6(L)  Platelets 150 - 440 K/uL 299 347 327   CMP Latest Ref Rng 01/05/2015 01/04/2015 01/03/2015  Glucose 65 - 99 mg/dL 213(Y296(H) 865(H197(H) 846(N173(H)  BUN 6 - 20 mg/dL 62(X36(H) 52(W23(H) 13  Creatinine 0.44 - 1.00 mg/dL 4.130.80 2.440.68 0.100.69  Sodium 135 - 145 mmol/L 145 144 139  Potassium 3.5 - 5.1 mmol/L 3.3(L) 3.5 4.0  Chloride 101 - 111 mmol/L 109 107 103  CO2 22 - 32 mmol/L 29 29 32  Calcium 8.9 - 10.3 mg/dL 7.7(L) 8.1(L) 8.2(L)  Total Protein 6.5 - 8.1 g/dL - - -  Total Bilirubin 0.3 - 1.2 mg/dL - - -  Alkaline Phos 38 - 126 U/L - - -  AST 15 - 41 U/L - - -  ALT 14 - 54 U/L - - -    I/O last 3 completed shifts: In: 3417.4 [I.V.:2942.4; Other:75; IV Piggyback:400] Out: 2500 [Urine:2345; Drains:155] Total I/O In: 202.3 [I.V.:102.3; IV Piggyback:100] Out: -     IMP  postop respiratory failure and altered mental status with negative workup except for elevated ammonia. She is postoperative day #6 from repair of a perforated gastric ulcer with Cheree DittoGraham patch.  She remains hyperglycemic. Sliding scale insulin will be initiated.  Plan:  sliding scale insulin Place oral or nasogastric tube for feeding purposes and initiate tube feeds. Continue Jackson-Pratt drains. Continue antibiotics. Overall guarded  prognosis.

## 2015-01-05 NOTE — Progress Notes (Signed)
Patient remained on vent throughout shift. Noted agitation at times. Changes were made to patient's sedation due to complete bite occlusion of et tube. Bite block was put in place to facilitate getting airway patent. Patient tolerated well. Vent functioning properly

## 2015-01-05 NOTE — Progress Notes (Signed)
[facility] LAB REPLACEMENT ONLY  The patient does apply [facility] the [facility] Protocol based on the criteria listed below:   1. Is GFR >/= 40 ml/min? Yes.    Patient's GFR today is >60 2. Is urine output >/= 0.5 ml/kg/hr for the last 6 hours? Yes.   Patient's UOP is 1.0 ml/kg/hr 3. Is BUN < 60 mg/dL? Yes.    Patient's BUN today is 36 4. Abnormal electrolyte(s): K 3.3 5. Ordered repletion with: per protocol 6. If a panic level lab has been reported, has the CCM MD in charge been notified? No..   Physician:    Markus DaftWHELAN, Mistey Hoffert A 01/05/2015 6:34 AM

## 2015-01-06 ENCOUNTER — Inpatient Hospital Stay: Payer: Self-pay

## 2015-01-06 DIAGNOSIS — Z9889 Other specified postprocedural states: Secondary | ICD-10-CM

## 2015-01-06 LAB — COMPREHENSIVE METABOLIC PANEL
ALBUMIN: 2.1 g/dL — AB (ref 3.5–5.0)
ALT: 43 U/L (ref 14–54)
AST: 41 U/L (ref 15–41)
Alkaline Phosphatase: 97 U/L (ref 38–126)
Anion gap: 6 (ref 5–15)
BUN: 27 mg/dL — AB (ref 6–20)
CALCIUM: 7.7 mg/dL — AB (ref 8.9–10.3)
CHLORIDE: 113 mmol/L — AB (ref 101–111)
CO2: 28 mmol/L (ref 22–32)
CREATININE: 0.66 mg/dL (ref 0.44–1.00)
GFR calc Af Amer: >60 mL/min (ref >60)
GLUCOSE: 137 mg/dL — AB (ref 65–99)
POTASSIUM: 3.5 mmol/L (ref 3.5–5.1)
Sodium: 147 mmol/L — ABNORMAL HIGH (ref 135–145)
TOTAL PROTEIN: 5.9 g/dL — AB (ref 6.5–8.1)
Total Bilirubin: 0.5 mg/dL (ref 0.3–1.2)

## 2015-01-06 LAB — CBC
HEMATOCRIT: 24.4 % — AB (ref 35.0–47.0)
Hemoglobin: 7.7 g/dL — ABNORMAL LOW (ref 12.0–16.0)
MCH: 28.7 pg (ref 26.0–34.0)
MCHC: 31.7 g/dL — ABNORMAL LOW (ref 32.0–36.0)
MCV: 90.7 fL (ref 80.0–100.0)
PLATELETS: 344 10*3/uL (ref 150–440)
RBC: 2.69 MIL/uL — AB (ref 3.80–5.20)
RDW: 15.2 % — ABNORMAL HIGH (ref 11.5–14.5)
WBC: 13.5 10*3/uL — AB (ref 3.6–11.0)

## 2015-01-06 LAB — AMMONIA: Ammonia: 51 umol/L — ABNORMAL HIGH (ref 9–35)

## 2015-01-06 LAB — GLUCOSE, CAPILLARY
GLUCOSE-CAPILLARY: 148 mg/dL — AB (ref 65–99)
GLUCOSE-CAPILLARY: 157 mg/dL — AB (ref 65–99)
GLUCOSE-CAPILLARY: 127 mg/dL — AB (ref 65–99)
Glucose-Capillary: 125 mg/dL — ABNORMAL HIGH (ref 65–99)
Glucose-Capillary: 128 mg/dL — ABNORMAL HIGH (ref 65–99)
Glucose-Capillary: 160 mg/dL — ABNORMAL HIGH (ref 65–99)

## 2015-01-06 MED ORDER — POTASSIUM CHLORIDE 2 MEQ/ML IV SOLN
INTRAVENOUS | Status: DC
  Administered 2015-01-06 – 2015-01-10 (×7): via INTRAVENOUS
  Filled 2015-01-06 (×10): qty 1000

## 2015-01-06 MED ORDER — FUROSEMIDE 10 MG/ML IJ SOLN
40.0000 mg | Freq: Once | INTRAMUSCULAR | Status: AC
  Administered 2015-01-06: 40 mg via INTRAVENOUS
  Filled 2015-01-06: qty 4

## 2015-01-06 MED ORDER — POTASSIUM CHLORIDE 20 MEQ/15ML (10%) PO SOLN
40.0000 meq | Freq: Once | ORAL | Status: AC
  Administered 2015-01-06: 40 meq
  Filled 2015-01-06: qty 30

## 2015-01-06 MED ORDER — PRO-STAT SUGAR FREE PO LIQD
30.0000 mL | Freq: Two times a day (BID) | ORAL | Status: DC
  Administered 2015-01-06 – 2015-01-08 (×5): 30 mL via ORAL

## 2015-01-06 MED ORDER — DEXMEDETOMIDINE HCL IN NACL 400 MCG/100ML IV SOLN
0.0000 ug/kg/h | INTRAVENOUS | Status: DC
  Administered 2015-01-06: 0.4 ug/kg/h via INTRAVENOUS
  Administered 2015-01-08: 0.2 ug/kg/h via INTRAVENOUS
  Administered 2015-01-09: 0.5 ug/kg/h via INTRAVENOUS
  Filled 2015-01-06 (×4): qty 100

## 2015-01-06 MED ORDER — VITAL HIGH PROTEIN PO LIQD
1000.0000 mL | ORAL | Status: DC
  Administered 2015-01-06: 1000 mL

## 2015-01-06 MED ORDER — ACETAMINOPHEN 325 MG PO TABS
650.0000 mg | ORAL_TABLET | Freq: Four times a day (QID) | ORAL | Status: DC | PRN
  Administered 2015-01-06 – 2015-01-09 (×4): 650 mg
  Filled 2015-01-06 (×4): qty 2

## 2015-01-06 MED ORDER — ANTISEPTIC ORAL RINSE SOLUTION (CORINZ)
7.0000 mL | Freq: Four times a day (QID) | OROMUCOSAL | Status: DC
  Administered 2015-01-06 – 2015-01-11 (×19): 7 mL via OROMUCOSAL
  Filled 2015-01-06 (×18): qty 7

## 2015-01-06 MED ORDER — CHLORHEXIDINE GLUCONATE 0.12% ORAL RINSE (MEDLINE KIT)
15.0000 mL | Freq: Two times a day (BID) | OROMUCOSAL | Status: DC
  Administered 2015-01-06 – 2015-01-12 (×12): 15 mL via OROMUCOSAL
  Filled 2015-01-06 (×18): qty 15

## 2015-01-06 NOTE — Progress Notes (Signed)
PULMONARY / CRITICAL CARE MEDICINE   Name: Brandy Robinson MRN: [id number] DOB: December 17, 1965    ADMISSION DATE:  12/30/2014  BRIEF HISTORY: 49 year old female past medical history of depression, anxiety, gastric reflux, insomnia, bipolar, takes ibuprofen and powders for pain at home. Admitted with severe abdominal pain, found to have free air in the peritoneum, taken today or for perforated gastric ulcer surgery with worsening confusion and hypotension, transferred to ICU with elevated heart rate, status post open laparotomy for perforated viscus..  SUBJECTIVE:  Passed SBT this AM but was too sedated to extubate. Subsequently very agitated and sedation was resumed  VITAL SIGNS: Temp:  [98.5 F (36.9 C)-100.5 F (38.1 C)] 100.5 F (38.1 C) (12/20 1600) Pulse Rate:  [71-125] 119 (12/20 1600) Resp:  [15-31] 18 (12/20 1600) BP: (88-146)/(42-106) 108/48 mmHg (12/20 1600) SpO2:  [95 %-100 %] 97 % (12/20 1600) FiO2 (%):  [40 %] 40 % (12/20 1600) Weight:  [168 lb 14 oz (76.6 kg)] 168 lb 14 oz (76.6 kg) (12/20 0500) HEMODYNAMICS:   VENTILATOR SETTINGS: Vent Mode:  [-] PRVC FiO2 (%):  [40 %] 40 % Set Rate:  [16 bmp] 16 bmp Vt Set:  [10 mL-420 mL] 420 mL PEEP:  [5 cmH20] 5 cmH20 Pressure Support:  [10 cmH20-15 cmH20] 10 cmH20 INTAKE / OUTPUT:  Intake/Output Summary (Last 24 hours) at 01/06/15 1825 Last data filed at 01/06/15 1637  Gross per 24 hour  Intake 2809.74 ml  Output   3119 ml  Net -309.26 ml    Review of Systems  Unable to perform ROS: mental acuity    Physical Exam  Constitutional: She is well-developed, well-nourished, and in no distress.  HENT:  Head: Normocephalic and atraumatic.  Eyes: Conjunctivae and EOM are normal. Pupils are equal, round, and reactive to light.  Neck: No JVD present. No thyromegaly present.  Cardiovascular: Normal rate, regular rhythm, normal heart sounds and intact distal pulses.  Exam reveals no friction rub.   No murmur  heard. Pulmonary/Chest: Breath sounds normal. She has no wheezes. She has no rales.  Abdominal: She exhibits distension. There is no rebound and no guarding.  Incision intact, no purulent drainage, bandages in place  Musculoskeletal: She exhibits no edema.  Lymphadenopathy:    She has no cervical adenopathy.  Neurological: She has normal reflexes. No cranial nerve deficit.  Confused, altered mental status  Skin: Skin is warm and dry.     LABS:  CBC  Recent Labs Lab 01/04/15 0429 01/05/15 0521 01/06/15 0430  WBC 9.8 8.4 13.5*  HGB 7.6* 7.4* 7.7*  HCT 24.0* 23.3* 24.4*  PLT 347 299 344   Coag's  Recent Labs Lab 12/31/14 0659 01/02/15 0501  INR 1.47 1.19   BMET  Recent Labs Lab 01/04/15 0429 01/05/15 0521 01/06/15 0430  NA 144 145 147*  K 3.5 3.3* 3.5  CL 107 109 113*  CO2 BUN 23* 36* 27*  CREATININE 0.68 0.80 0.66  GLUCOSE 197* 296* 137*   Electrolytes  Recent Labs Lab 01/04/15 0429 01/05/15 0521 01/06/15 0430  CALCIUM 8.1* 7.7* 7.7*   Sepsis Markers No results for input(s): LATICACIDVEN, PROCALCITON, O2SATVEN in the last 168 hours. ABG  Recent Labs Lab 01/04/15 1100 01/04/15 1610 01/04/15 2125  PHART 7.47* 7.46* 7.51*  PCO2ART 43 44 42  PO2ART 66* 66* 131*   Liver Enzymes  Recent Labs Lab 01/06/15 0430  AST 41  ALT 43  ALKPHOS 97  BILITOT 0.5  ALBUMIN 2.1*  Cardiac Enzymes No results for input(s): TROPONINI, PROBNP in the last 168 hours. Glucose  Recent Labs Lab 01/05/15 2006 01/06/15 0013 01/06/15 0430 01/06/15 0814 01/06/15 1208 01/06/15 1611  GLUCAP 150* 160* 125* 128* 148* 157*    Imaging Dg Chest Port 1 View  01/06/2015  CLINICAL DATA:  Respiratory failure, perforated viscus. EXAM: PORTABLE CHEST 1 VIEW COMPARISON:  Portable chest x-ray of January 04, 2015 FINDINGS: The lung volumes are low. The pulmonary interstitial markings are increased bilaterally. The pulmonary vascularity is engorged and  indistinct. The cardiac silhouette is top-normal in size. There is small left pleural effusion. The endotracheal tube tip lies 3 cm above the carina. The esophagogastric tube tip projects below the inferior margin of the image. IMPRESSION: Allowing for a bilateral hypoinflation there has not been significant interval change in the appearance of the chest. Persistent fluffy alveolar pneumonia or edema remain. There is a persistent small left pleural effusion. The support tubes are in reasonable position. Electronically Signed   By: Dequavious Harshberger  [place]azil[place] M.D.   On: 01/06/2015 07:37    LINES/TUBES: ETT 12/18 >>   MICRO: MRSA PCR 12/14 >> NEG Urine 12/13 >> NEG Blood 12/13 >> NEG resp 12/18 >> NEG  ANTIBIOTICS Zosyn 12/14 >>   ASSESSMENT / PLAN: Post laparotomy 12/13 for perforated gastric ulcer Acute resp failure - intubated for extreme agitation and concomitant respiratory distress Bilateral pulmonary infiltrates on CXR - edema vs PNA vs ALI/ARDS  Note normal LVEF on Echocardiogram 12/15 Hypernatremia Hypokalemia Elevated ammonia level of unclear significance Severe sepsis, peritonitis Anemia without overt bleeding presently Bipolar disorder with multiple chronic psych meds Severe intermittent agitated delirium  Cont vent support - settings reviewed and/or adjusted Wean on PSV mode as tolerated Cont vent bundle Daily WUA/SBT Monitor BMET intermittently Monitor I/Os Correct electrolytes as indicated IVFs adjusted 12/20 Lasix X 1 12/20 SUP: IV pantoprazole NGT placed 12/19 Cont TFs DVT px: SCDs Monitor CBC intermittently Transfuse per usual guidelines Monitor temp, WBC count Micro and abx as above I have reviewed home meds - resume quetiapine, buspirone, bupropion Continue propofol - RASS goal 0, -1 Transition from propofol to dexmedetomidine 01/06/15 Cont PRN lorazepam, fentanyl as ordered   CCM time: 40 mins The above time includes time spent in consultation with patient  and/or family members and reviewing care plan on multidisciplinary rounds  Billy Fischeravid Lynnzie Blackson, MD PCCM service Mobile [phone] Pager [phone]

## 2015-01-06 NOTE — Progress Notes (Signed)
Nutrition Follow-up  INTERVENTION:   EN: with calories from D5 and diprivan, recommend decreasing TF to rate of 20 ml/hr with addition of Prostat BID providing 72 g  Protein, 680 kcals. Additional 950 estimated kcals from diprivan, D5. Continue to assess   NUTRITION DIAGNOSIS:   Inadequate oral intake related to acute illness, altered GI function as evidenced by NPO status.  GOAL:   Patient will meet greater than or equal to 90% of their needs  MONITOR:    (Energy Intake, Anthropometrics, Digestive System, Electrolyte/Renal Profile, Glucose Profile)  REASON FOR ASSESSMENT:   LOS    ASSESSMENT:    Pt remains on vent   EN: tolerating Vital High Protein at rate of 38 mlhr  Skin:  Reviewed, no issues  Last BM:  12/19 large loose post lactulose   Electrolyte and Renal Profile:  Recent Labs Lab 01/04/15 0429 01/05/15 0521 01/06/15 0430  BUN 23* 36* 27*  CREATININE 0.68 0.80 0.66  NA 144 145 147*  K 3.5 3.3* 3.5   Glucose Profile:   Recent Labs  01/06/15 0430 01/06/15 0814 01/06/15 1208  GLUCAP 125* 128* 148*   Gastrointestinal Profile: ammonia 51 (down from 75)  Meds: D5 at 75 ml/hr (306 kcals), diprivan (644 kcals in past 24 hours), lasix, ss novolog, lactulose  Height:   Ht Readings from Last 1 Encounters:  12/30/14 5\' 3"  (1.6 m)    Weight:   Wt Readings from Last 1 Encounters:  01/06/15 168 lb 14 oz (76.6 kg)    Filed Weights   01/01/15 0419 01/05/15 1600 01/06/15 0500  Weight: 167 lb 5.3 oz (75.9 kg) 166 lb 7.2 oz (75.5 kg) 168 lb 14 oz (76.6 kg)    BMI:  Body mass index is 29.92 kg/(m^2).  Estimated Nutritional Needs:   Kcal:  TEE 1645 kcals/d (Ve 10.3, T max37.3)   Protein:  (1.2-2.0 g/kg) 91-152 g/d  Fluid:  (25-4430ml/kg) 1900-226480ml/d  EDUCATION NEEDS:   No education needs identified at this time  HIGH Care Level  Brandy Starcherate Brandy Eichhorn MS, RD, LDN [phone] Pager  [phone] Weekend/On-Call Pager

## 2015-01-06 NOTE — Progress Notes (Signed)
[facility] [place] at [facility]   PATIENT NAME: Brandy PyleKimberly Robinson    MR#:  [id number]  DATE OF BIRTH:  11-Sep-1965  SUBJECTIVE:  CHIEF COMPLAINT:   Chief Complaint  Patient presents with  . Abdominal Pain   -Patient admitted for perforated gastric ulcer and had surgery done. Postop course complicated by disorientation, tachycardia, hypoxia due to ARDS ad encephalopathy - patient is intubated and on sedation. Appears critically ill  REVIEW OF SYSTEMS:  Review of Systems  Unable to perform ROS: critical illness    DRUG ALLERGIES:   Allergies  Allergen Reactions  . Hydrocodone Itching  . Aspirin Other (See Comments)    Reaction:  GI upset   . Morphine And Related Itching, Nausea And Vomiting and Other (See Comments)    Reaction:  Chest pain    VITALS:  Blood pressure 119/59, pulse 106, temperature 98.8 F (37.1 C), temperature source Axillary, resp. rate 18, height 5\' 3"  (1.6 m), weight 76.6 kg (168 lb 14 oz), SpO2 98 %.  PHYSICAL EXAMINATION:  Physical Exam  GENERAL:  49 y.o.-year-old patient lying in the bed, Intubated and sedated.  EYES: Pupils equal, round, reactive to light and accommodation. No scleral icterus. Extraocular muscles intact.  HEENT: Head atraumatic, normocephalic. Oropharynx and nasopharynx clear. OGT and ETT in place. NECK:  Supple, no jugular venous distention. No thyroid enlargement, no tenderness.  LUNGS: Coarse rhonchi diffusely, Decreased bibasilar breath sounds. no wheezing, rales or crepitation. No use of accessory muscles of respiration.  CARDIOVASCULAR: S1, S2 normal. No murmurs, rubs, or gallops.  ABDOMEN: Abdomen is distended, recent surgeries with dressings and 3 JP drains in place. Tender diffusely. Hypoactive bowel sounds are present.  EXTREMITIES: No cyanosis, or clubbing. 1+ pedal edema noted, dopplerable pulses noted. NEUROLOGIC: on sedation, withdrawing to pain noted. Breathing over the vent when sedation is  decreased  PSYCHIATRIC: The patient is sedated  SKIN: No obvious rash, lesion, or ulcer.    LABORATORY PANEL:   CBC  Recent Labs Lab 01/06/15 0430  WBC 13.5*  HGB 7.7*  HCT 24.4*  PLT 344   ------------------------------------------------------------------------------------------------------------------  Chemistries   Recent Labs Lab 01/06/15 0430  NA 147*  K 3.5  CL 113*  CO2 28  GLUCOSE 137*  BUN 27*  CREATININE 0.66  CALCIUM 7.7*  AST 41  ALT 43  ALKPHOS 97  BILITOT 0.5   ------------------------------------------------------------------------------------------------------------------  Cardiac Enzymes No results for input(s): TROPONINI in the last 168 hours. ------------------------------------------------------------------------------------------------------------------  RADIOLOGY:  Dg Abd 1 View  01/05/2015  CLINICAL DATA:  49 year old female with nasogastric tube placement. Subsequent encounter. EXAM: ABDOMEN - 1 VIEW COMPARISON:  01/04/2015. FINDINGS: Multiple overlying leads and surgical drainage catheters are in place. Nasogastric tube side hole just beyond the gastroesophageal junction with the tip of the nasogastric tube at the gastric body level (greater curvature). Gas-filled nondilated stomach. Basilar atelectasis greater on the left. IMPRESSION: Nasogastric tube tip gastric body level with the side hole just beyond the gastroesophageal junction. Electronically Signed   By: Lacy DuverneySteven  Olson M.D.   On: 01/05/2015 10:43   Dg Abd 1 View  01/04/2015  CLINICAL DATA:  49 year old female with OG tube placement history of gastroesophageal reflux disease. EXAM: ABDOMEN - 1 VIEW COMPARISON:  Radiograph dated 2512 of FINDINGS: Partially visualized enteric tube is noted over the lower esophagus likely at the gastroesophageal junction. Recommend advancement of the tube into the stomach. Three drainage tubes are again identified in the visualized abdomen. Similar to prior  study. No evidence of bowel obstruction. The osseous structures are grossly unremarkable. IMPRESSION: Enteric tube is partially visualized and appears over the distal esophagus or gastroesophageal junction. Recommend advancement into the stomach. Electronically Signed   By: Elgie Collard M.D.   On: 01/04/2015 19:15   Dg Abd 1 View  01/04/2015  CLINICAL DATA:  Status post orogastric tube placement. EXAM: ABDOMEN - 1 VIEW COMPARISON:  01/04/2015 at 1617 hours FINDINGS: The tip of the orogastric tube projects in the distal esophagus. It will need to be inserted at least 10 cm to allow the tube to extend well into stomach. No other change from prior study. Postsurgical changes are again noted. IMPRESSION: Orogastric tube tip projects in the distal esophagus and does not enter the stomach. Electronically Signed   By: Amie Portland M.D.   On: 01/04/2015 19:09   Dg Abd 1 View  01/04/2015  CLINICAL DATA:  Patient in CCU. Interval removal of the NG tube. Abdominal distention. EXAM: ABDOMEN - 1 VIEW COMPARISON:  12/30/2014 FINDINGS: Surgical drains within the central abdomen and left abdomen are identified. Skin staples are noted along the midline of the abdomen. Mild gaseous distension of large bowel loops noted. No abnormal small bowel dilatation identified. IMPRESSION: 1. No evidence for bowel obstruction. 2. Mild gaseous distension of large bowel loops. Electronically Signed   By: Signa Kell M.D.   On: 01/04/2015 16:54   Dg Chest Port 1 View  01/06/2015  CLINICAL DATA:  Respiratory failure, perforated viscus. EXAM: PORTABLE CHEST 1 VIEW COMPARISON:  Portable chest x-ray of January 04, 2015 FINDINGS: The lung volumes are low. The pulmonary interstitial markings are increased bilaterally. The pulmonary vascularity is engorged and indistinct. The cardiac silhouette is top-normal in size. There is small left pleural effusion. The endotracheal tube tip lies 3 cm above the carina. The esophagogastric tube  tip projects below the inferior margin of the image. IMPRESSION: Allowing for a bilateral hypoinflation there has not been significant interval change in the appearance of the chest. Persistent fluffy alveolar pneumonia or edema remain. There is a persistent small left pleural effusion. The support tubes are in reasonable position. Electronically Signed   By: David  [place] M.D.   On: 01/06/2015 07:37   Dg Chest Port 1 View  01/04/2015  CLINICAL DATA:  49 year old female with respiratory failure and ET tube placement. EXAM: PORTABLE CHEST 1 VIEW COMPARISON:  Radiograph dated 01/04/2015 FINDINGS: There has been interval placement of an endotracheal tube with tip approximately 1.5 cm above the carina. Recommend retraction and repositioning of the tube by approximately 4 cm for optimal positioning. An endotracheal tube is partially visualized coursing to the upper abdomen. The tip of the endotracheal tube is poorly visualized but appears to be over the lower mediastinum likely at the gastroesophageal junction. Recommend advancing the tube into the stomach and follow-up imaging to confirm positioning. Diffuse central vascular prominence as well as the areas of central airspace opacity noted. A small left pleural effusion may be present. There is no pneumothorax. The cardiac silhouette is within normal limits. The osseous structures are grossly unremarkable. IMPRESSION: Interval placement of an endotracheal and enteric tube. Recommend retraction of the endotracheal tube by approximately 4 cm and advancement of the enteric tube into the stomach for optimal positioning. Central vascular congestion and pulmonary opacities. Electronically Signed   By: Elgie Collard M.D.   On: 01/04/2015 19:14    EKG:   Orders placed or performed in visit on 04/23/11  . EKG  12-Lead    ASSESSMENT AND PLAN:   *Perforated gastric ulcer status post surgery 12/30/14. Management per primary team , on empiric antibiotics. - OG  tube and on tube feeds - drains with minimal drainage -Continue supportive management with fluids and pain medications  * Acute respi failure- hypoxic  X ray shows b/l Infiltrates Vs - edema -Appreciate pulmonary consult. -Echocardiogram with normal ejection fraction of 50-55%. - Intubated again and on vent , SBT every day, on PS today - neurologically still hasn't improved and so no plans for extubation for now  * UTI -urine cultures are negative. she is anyway on Zosyn for her intra-abdominal surgery.   * Anemia-acute postop anemia. Hemoglobin dropped from 10 to 7.5. Transfuse if hemoglobin less than 7. Continue to monitor.  * Gastric ulcer disease With chronic use of NSAIDS and Goody powders. continue Protonix IV , and outpatient GI follow up  * Depression and bipolar disorders Meds restarted- on wellbutyrin, celexa, buspar and klonopin  * Acute encephalopathy- hepatic and metabolic -Intubated and on propofol drip. -Ammonia is elevated. Continue lactulose. F/u ammonia again -Continue to monitor.  - CT head with no acute findings  All the records are reviewed and case discussed with Care Management/Social Workerr. Management plans discussed with the patient, family and they are in agreement.  CODE STATUS: Full Code  TOTAL CRITICAL CARE TIME SPENT IN TAKING CARE OF THIS PATIENT: 40 minutes.     Enid Baas M.D on 01/06/2015 at 3:46 PM  Between 7am to 6pm - Pager - [phone]  After 6pm go to [email or web address] - password [email or web address]  [facility]  Office  [phone]  CC: Primary care physician; Pcp Not In System

## 2015-01-06 NOTE — Progress Notes (Signed)
ANTIBIOTIC CONSULT NOTE -follow up  Pharmacy Consult for Zosyn dosing Indication: intra-abdominal infection  Allergies  Allergen Reactions  . Hydrocodone Itching  . Aspirin Other (See Comments)    Reaction:  GI upset   . Morphine And Related Itching, Nausea And Vomiting and Other (See Comments)    Reaction:  Chest pain    Patient Measurements: Height:  (160 cm) Weight: 168 lb 14 oz (76.6 kg) IBW/kg (Calculated) : 52.4 Adjusted Body Weight: n/a  Vital Signs: Temp: 98.8 F (37.1 C) (12/20 0800) Temp Source: Axillary (12/20 0800) BP: 119/59 mmHg (12/20 1000) Pulse Rate: 106 (12/20 1000) Intake/Output from previous day: 12/19 0701 - 12/20 0700 In: 3456.3 [I.V.:2570.3; NG/GT:536; IV Piggyback:350] Out: 1329 [Urine:1125; Drains:203; Stool:1] Intake/Output from this shift: Total I/O In: 441.9 [I.V.:341.9; NG/GT:100] Out: 2435 [Urine:2300; Drains:135]  Labs:  Recent Labs  01/04/15 0429 01/05/15 0521 01/06/15 0430  WBC 9.8 8.4 13.5*  HGB 7.6* 7.4* 7.7*  PLT 347 299 344  CREATININE 0.68 0.80 0.66   Estimated Creatinine Clearance: 85.2 mL/min (by C-G formula based on Cr of 0.66).   Microbiology: Recent Results (from the past 720 hour(s))  Blood Culture (routine x 2)     Status: None   Collection Time: 12/30/14  5:37 PM  Result Value Ref Range Status   Specimen Description BLOOD RIGHT ASSIST CONTROL  Final   Special Requests   Final    BOTTLES DRAWN AEROBIC AND ANAEROBIC  3CC AERO, 2 ANAERO   Culture NO GROWTH 5 DAYS  Final   Report Status 01/04/2015 FINAL  Final  Blood Culture (routine x 2)     Status: None   Collection Time: 12/30/14  5:40 PM  Result Value Ref Range Status   Specimen Description BLOOD LEFT HAND  Final   Special Requests   Final    BOTTLES DRAWN AEROBIC AND ANAEROBIC  3CC AERO, 2CC ANAERO   Culture NO GROWTH 5 DAYS  Final   Report Status 01/04/2015 FINAL  Final  Urine culture     Status: None   Collection Time: 12/30/14  7:45 PM  Result  Value Ref Range Status   Specimen Description URINE, RANDOM  Final   Special Requests NONE  Final   Culture NO GROWTH  Final   Report Status 01/01/2015 FINAL  Final  MRSA PCR Screening     Status: None   Collection Time: 12/31/14 12:24 PM  Result Value Ref Range Status   MRSA by PCR NEGATIVE NEGATIVE Final    Comment:        The GeneXpert MRSA Assay (FDA approved for NASAL specimens only), is one component of a comprehensive MRSA colonization surveillance program. It is not intended to diagnose MRSA infection nor to guide or monitor treatment for MRSA infections.   Culture, respiratory (NON-Expectorated)     Status: None (Preliminary result)   Collection Time: 01/04/15  6:22 PM  Result Value Ref Range Status   Specimen Description TRACHEAL ASPIRATE  Final   Special Requests Normal  Final   Gram Stain   Final    FEW WBC SEEN NO ORGANISMS SEEN FAIR SPECIMEN - 70-80% WBCS    Culture NO GROWTH 2 DAYS  Final   Report Status PENDING  Incomplete    Medical History: Past Medical History  Diagnosis Date  . Wears glasses   . Depression   . Anxiety   . GERD (gastroesophageal reflux disease)   . Vaginal atrophy   . Lichen   . Surgical menopause   .  Endometriosis   . Dyspareunia   . Insomnia   . Bipolar disorder [facility](HCC)      Assessment: 49 y/o F s/p exploratory laparotomy for perforated gastric ulcer.   12/13 Bcx negative x 2 12/13 Ucx negative MRSA PCR Negative 12/18 TA NGTD  Plan:  Will continue Zosyn 3.375 g EI q 8 hours.   Luisa HartScott Antonea Gaut, PharmD  Clinical Pharmacist 01/06/2015 3:12 PM

## 2015-01-06 NOTE — Progress Notes (Signed)
7 Days Post-Op  Subjective: Status post repair of perforated gastric ulcer. Patient remains on a ventilator. White blood cell count is slightly increased and she had a low-grade temp this afternoon.  Objective: Vital signs in last 24 hours: Temp:  [98.5 F (36.9 C)-100.5 F (38.1 C)] 100.5 F (38.1 C) (12/20 1600) Pulse Rate:  [71-125] 119 (12/20 1600) Resp:  [15-31] 18 (12/20 1600) BP: (88-146)/(42-106) 108/48 mmHg (12/20 1600) SpO2:  [95 %-100 %] 97 % (12/20 1600) FiO2 (%):  [40 %] 40 % (12/20 1600) Weight:  [168 lb 14 oz (76.6 kg)] 168 lb 14 oz (76.6 kg) (12/20 0500) Last BM Date: 01/05/15  Intake/Output from previous day: 12/19 0701 - 12/20 0700 In: 3456.3 [I.V.:2570.3; NG/GT:536; IV Piggyback:350] Out: 1329 [Urine:1125; Drains:203; Stool:1] Intake/Output this shift:    Physical exam:  Stable on vent. Minutes distended minimally tympanitic and nontender wound is clean no erythema or drainage and JP drains are in place. As are nontender  Lab Results: CBC   Recent Labs  01/05/15 0521 01/06/15 0430  WBC 8.4 13.5*  HGB 7.4* 7.7*  HCT 23.3* 24.4*  PLT 299 344   BMET  Recent Labs  01/05/15 0521 01/06/15 0430  NA 145 147*  K 3.3* 3.5  CL 109 113*  CO2 29 28  GLUCOSE 296* 137*  BUN 36* 27*  CREATININE 0.80 0.66  CALCIUM 7.7* 7.7*   PT/INR No results for input(s): LABPROT, INR in the last 72 hours. ABG  Recent Labs  01/04/15 1610 01/04/15 2125  PHART 7.46* 7.51*  HCO3 31.3* 33.5*    Studies/Results: Dg Abd 1 View  01/05/2015  CLINICAL DATA:  49 year old female with nasogastric tube placement. Subsequent encounter. EXAM: ABDOMEN - 1 VIEW COMPARISON:  01/04/2015. FINDINGS: Multiple overlying leads and surgical drainage catheters are in place. Nasogastric tube side hole just beyond the gastroesophageal junction with the tip of the nasogastric tube at the gastric body level (greater curvature). Gas-filled nondilated stomach. Basilar atelectasis greater  on the left. IMPRESSION: Nasogastric tube tip gastric body level with the side hole just beyond the gastroesophageal junction. Electronically Signed   By: Lacy DuverneySteven  Olson M.D.   On: 01/05/2015 10:43   Dg Chest Port 1 View  01/06/2015  CLINICAL DATA:  Respiratory failure, perforated viscus. EXAM: PORTABLE CHEST 1 VIEW COMPARISON:  Portable chest x-ray of January 04, 2015 FINDINGS: The lung volumes are low. The pulmonary interstitial markings are increased bilaterally. The pulmonary vascularity is engorged and indistinct. The cardiac silhouette is top-normal in size. There is small left pleural effusion. The endotracheal tube tip lies 3 cm above the carina. The esophagogastric tube tip projects below the inferior margin of the image. IMPRESSION: Allowing for a bilateral hypoinflation there has not been significant interval change in the appearance of the chest. Persistent fluffy alveolar pneumonia or edema remain. There is a persistent small left pleural effusion. The support tubes are in reasonable position. Electronically Signed   By: David  [place] M.D.   On: 01/06/2015 07:37    Anti-infectives: Anti-infectives    Start     Dose/Rate Route Frequency Ordered Stop   12/31/14 0245  piperacillin-tazobactam (ZOSYN) IVPB 3.375 g  Status:  Discontinued     3.375 g 12.5 mL/hr over 240 Minutes Intravenous 4 times per day 12/31/14 0237 12/31/14 0330   12/31/14 0230  piperacillin-tazobactam (ZOSYN) IVPB 3.375 g  Status:  Discontinued     3.375 g 12.5 mL/hr over 240 Minutes Intravenous 4 times per day 12/31/14 0226 12/31/14  1478   12/31/14 0200  piperacillin-tazobactam (ZOSYN) IVPB 3.375 g     3.375 g 12.5 mL/hr over 240 Minutes Intravenous 3 times per day 12/30/14 2311     12/30/14 1730  piperacillin-tazobactam (ZOSYN) IVPB 3.375 g     3.375 g 100 mL/hr over 30 Minutes Intravenous  Once 12/30/14 1718 12/30/14 1821      Assessment/Plan: s/p Procedure(s): EXPLORATORY LAPAROTOMY-graham patch of peptic  ulcer   Sodium increased at 147. Otherwise patient with patient is stable with a low-grade temp. I will discuss with Dr. Egbert Garibaldi patient's plan for the week which may include a repeat CT scan.  Lattie Haw, MD, FACS  01/06/2015

## 2015-01-07 ENCOUNTER — Inpatient Hospital Stay: Payer: Self-pay

## 2015-01-07 ENCOUNTER — Encounter: Payer: Self-pay | Admitting: Radiology

## 2015-01-07 DIAGNOSIS — R509 Fever, unspecified: Secondary | ICD-10-CM

## 2015-01-07 DIAGNOSIS — S27309D Unspecified injury of lung, unspecified, subsequent encounter: Secondary | ICD-10-CM

## 2015-01-07 LAB — GLUCOSE, CAPILLARY
GLUCOSE-CAPILLARY: 107 mg/dL — AB (ref 65–99)
GLUCOSE-CAPILLARY: 113 mg/dL — AB (ref 65–99)
GLUCOSE-CAPILLARY: 122 mg/dL — AB (ref 65–99)
GLUCOSE-CAPILLARY: 138 mg/dL — AB (ref 65–99)
Glucose-Capillary: 111 mg/dL — ABNORMAL HIGH (ref 65–99)
Glucose-Capillary: 165 mg/dL — ABNORMAL HIGH (ref 65–99)
Glucose-Capillary: 149 mg/dL — ABNORMAL HIGH (ref 65–99)

## 2015-01-07 LAB — CULTURE, RESPIRATORY W GRAM STAIN: Special Requests: NORMAL

## 2015-01-07 LAB — AMMONIA: Ammonia: 44 umol/L — ABNORMAL HIGH (ref 9–35)

## 2015-01-07 LAB — URINALYSIS COMPLETE WITH MICROSCOPIC (ARMC ONLY)
BILIRUBIN URINE: NEGATIVE
Bacteria, UA: NONE SEEN
Glucose, UA: NEGATIVE mg/dL
KETONES UR: NEGATIVE mg/dL
NITRITE: NEGATIVE
Protein, ur: NEGATIVE mg/dL
SPECIFIC GRAVITY, URINE: 1.047 — AB (ref 1.005–1.030)
pH: 8 (ref 5.0–8.0)

## 2015-01-07 LAB — RAPID HIV SCREEN (HIV 1/2 AB+AG)
HIV 1/2 Antibodies: NONREACTIVE
HIV-1 P24 Antigen - HIV24: NONREACTIVE

## 2015-01-07 LAB — BASIC METABOLIC PANEL
Anion gap: 5 (ref 5–15)
BUN: 20 mg/dL (ref 6–20)
CALCIUM: 7.6 mg/dL — AB (ref 8.9–10.3)
CHLORIDE: 104 mmol/L (ref 101–111)
CO2: 32 mmol/L (ref 22–32)
Creatinine, Ser: 0.61 mg/dL (ref 0.44–1.00)
Glucose, Bld: 128 mg/dL — ABNORMAL HIGH (ref 65–99)
Potassium: 4 mmol/L (ref 3.5–5.1)
SODIUM: 141 mmol/L (ref 135–145)

## 2015-01-07 LAB — CBC
HCT: 26.1 % — ABNORMAL LOW (ref 35.0–47.0)
HEMOGLOBIN: 8.2 g/dL — AB (ref 12.0–16.0)
MCH: 28.6 pg (ref 26.0–34.0)
MCHC: 31.4 g/dL — AB (ref 32.0–36.0)
MCV: 91 fL (ref 80.0–100.0)
Platelets: 326 10*3/uL (ref 150–440)
RBC: 2.87 MIL/uL — ABNORMAL LOW (ref 3.80–5.20)
RDW: 15.4 % — ABNORMAL HIGH (ref 11.5–14.5)
WBC: 13.4 10*3/uL — ABNORMAL HIGH (ref 3.6–11.0)

## 2015-01-07 LAB — C DIFFICILE QUICK SCREEN W PCR REFLEX
C DIFFICILE (CDIFF) TOXIN: NEGATIVE
C Diff antigen: NEGATIVE
C Diff interpretation: NEGATIVE

## 2015-01-07 LAB — CULTURE, RESPIRATORY: CULTURE: NO GROWTH

## 2015-01-07 MED ORDER — SODIUM CHLORIDE 0.9 % IV SOLN
100.0000 mg | INTRAVENOUS | Status: DC
  Administered 2015-01-08 – 2015-01-11 (×4): 100 mg via INTRAVENOUS
  Filled 2015-01-07 (×8): qty 100

## 2015-01-07 MED ORDER — BUPROPION HCL 75 MG PO TABS
75.0000 mg | ORAL_TABLET | Freq: Two times a day (BID) | ORAL | Status: DC
  Administered 2015-01-07 – 2015-01-12 (×10): 75 mg via ORAL
  Filled 2015-01-07 (×14): qty 1

## 2015-01-07 MED ORDER — VITAL HIGH PROTEIN PO LIQD
1000.0000 mL | ORAL | Status: DC
  Administered 2015-01-07 (×2): 1000 mL

## 2015-01-07 MED ORDER — IOHEXOL 240 MG/ML SOLN
25.0000 mL | INTRAMUSCULAR | Status: AC
  Administered 2015-01-07 (×2): 25 mL via ORAL

## 2015-01-07 MED ORDER — QUETIAPINE FUMARATE 25 MG PO TABS
50.0000 mg | ORAL_TABLET | Freq: Two times a day (BID) | ORAL | Status: DC
  Administered 2015-01-07 – 2015-01-12 (×11): 50 mg via ORAL
  Filled 2015-01-07 (×12): qty 2

## 2015-01-07 MED ORDER — IOHEXOL 300 MG/ML  SOLN
100.0000 mL | Freq: Once | INTRAMUSCULAR | Status: AC | PRN
  Administered 2015-01-07: 100 mL via INTRAVENOUS

## 2015-01-07 MED ORDER — SODIUM CHLORIDE 0.9 % IV SOLN
200.0000 mg | Freq: Once | INTRAVENOUS | Status: AC
  Administered 2015-01-07: 200 mg via INTRAVENOUS
  Filled 2015-01-07: qty 200

## 2015-01-07 NOTE — Progress Notes (Addendum)
[facility] [place] [facility]   PATIENT NAME: Brandy PyleKimberly Robinson    MR#:  [id number]  DATE OF BIRTH:  04/16/1965  SUBJECTIVE:  CHIEF COMPLAINT:   Chief Complaint  Patient presents with  . Abdominal Pain   -Patient admitted for perforated gastric ulcer and had surgery done. Postop course complicated by disorientation, tachycardia, hypoxia due to ARDS ad encephalopathy - patient is intubated and on sedation. Appears critically ill  REVIEW OF SYSTEMS:  Review of Systems  Unable to perform ROS: critical illness    DRUG ALLERGIES:   Allergies  Allergen Reactions  . Hydrocodone Itching  . Aspirin Other (See Comments)    Reaction:  GI upset   . Morphine And Related Itching, Nausea And Vomiting and Other (See Comments)    Reaction:  Chest pain    VITALS:  Blood pressure 115/58, pulse 95, temperature 100.3 F (37.9 C), temperature source Axillary, resp. rate 15, height 5\' 3"  (1.6 m), weight 74.2 kg (163 lb 9.3 oz), SpO2 100 %.  PHYSICAL EXAMINATION:  Physical Exam  GENERAL:  49 y.o.-year-old patient lying in the bed, Intubated and sedated. Critically ill appearing EYES: Pupils equal, round, reactive to light and accommodation. No scleral icterus. Extraocular muscles intact.  HEENT: Head atraumatic, normocephalic. Oropharynx and nasopharynx clear. OGT and ETT in place. NECK:  Supple, no jugular venous distention. No thyroid enlargement, no tenderness.  LUNGS: Coarse rhonchi diffusely, Decreased bibasilar breath sounds. no wheezing, rales or crepitation. No use of accessory muscles of respiration.  CARDIOVASCULAR: S1, S2 normal. No murmurs, rubs, or gallops.  ABDOMEN: Abdomen is distended, recent surgeries with dressings and 3 JP drains in place. Tender diffusely. Hypoactive bowel sounds are present.  EXTREMITIES: No cyanosis, or clubbing. 1+ pedal edema noted, dopplerable pulses noted. NEUROLOGIC: off sedation this AM but not responding, no breathing  over the vent noted today.  PSYCHIATRIC: The patient is not responding while sedation turned off today SKIN: No obvious rash, lesion, or ulcer.    LABORATORY PANEL:   CBC  Recent Labs Lab 01/07/15 0423  WBC 13.4*  HGB 8.2*  HCT 26.1*  PLT 326   ------------------------------------------------------------------------------------------------------------------  Chemistries   Recent Labs Lab 01/06/15 0430 01/07/15 0423  NA 147* 141  K 3.5 4.0  CL 113* 104  CO2 28 32  GLUCOSE 137* 128*  BUN 27* 20  CREATININE 0.66 0.61  CALCIUM 7.7* 7.6*  AST 41  --   ALT 43  --   ALKPHOS 97  --   BILITOT 0.5  --    ------------------------------------------------------------------------------------------------------------------  Cardiac Enzymes No results for input(s): TROPONINI in the last 168 hours. ------------------------------------------------------------------------------------------------------------------  RADIOLOGY:  Dg Chest Port 1 View  01/07/2015  CLINICAL DATA:  Acute respiratory failure, perforated viscus EXAM: PORTABLE CHEST 1 VIEW COMPARISON:  Portable chest x-ray of January 06, 2015 FINDINGS: The lung volumes remain low. The interstitial markings remain increased with areas of confluence. The retrocardiac region on the left is slightly less dense today in the left hemidiaphragm is now partially visible. A small left pleural effusion remains The heart is normal in size. The pulmonary vascularity is not clearly engorged. The endotracheal tube tip lies 2.8 cm above the carina. The esophagogastric tube tip projects below the inferior margin of the image. IMPRESSION: Persistent hypoinflation. Slight interval improvement in pulmonary interstitial edema and left lower lobe atelectasis or pneumonia. There is no pneumothorax or large pleural effusion. The support tubes are in reasonable position. Electronically Signed   By:  David  [place] M.D.   On: 01/07/2015 07:26   Dg Chest  Port 1 View  01/06/2015  CLINICAL DATA:  Respiratory failure, perforated viscus. EXAM: PORTABLE CHEST 1 VIEW COMPARISON:  Portable chest x-ray of January 04, 2015 FINDINGS: The lung volumes are low. The pulmonary interstitial markings are increased bilaterally. The pulmonary vascularity is engorged and indistinct. The cardiac silhouette is top-normal in size. There is small left pleural effusion. The endotracheal tube tip lies 3 cm above the carina. The esophagogastric tube tip projects below the inferior margin of the image. IMPRESSION: Allowing for a bilateral hypoinflation there has not been significant interval change in the appearance of the chest. Persistent fluffy alveolar pneumonia or edema remain. There is a persistent small left pleural effusion. The support tubes are in reasonable position. Electronically Signed   By: David  [place] M.D.   On: 01/06/2015 07:37    EKG:   Orders placed or performed in visit on 04/23/11  . EKG 12-Lead    ASSESSMENT AND PLAN:   *Perforated gastric ulcer status post surgery 12/30/14. Management per primary team , on empiric antibiotics. - OG tube and on tube feeds - drains with minimal drainage -Continue supportive management with fluids and pain medications - repeat CT abd today for recurring fever  * Acute respi failure- hypoxic  X ray shows b/l Infiltrates Vs - edema -Appreciate pulmonary consult. -Echocardiogram with normal ejection fraction of 50-55%. - Intubated again and on vent , SBT every day, on PS as tolerated - neurologically still hasn't improved and so no plans for extubation for now  * Sepsis- fevers, either pulmonary or intraabdominal source - on zosyn, might add vancomycin - blood cultures and CT abd repeat - requested ID consult  * Anemia-acute postop anemia. Hemoglobin dropped after surgery, but has been stable Transfuse if hemoglobin less than 7. Continue to monitor.  * Gastric ulcer disease With chronic use of  NSAIDS and Goody powders. continue Protonix IV , and outpatient GI follow up  * Depression and bipolar disorders Meds restarted- on wellbutyrin, celexa, buspar and klonopin  * Acute encephalopathy- hepatic and metabolic -Intubated and off sedation today but not responding - might need repeat CT head and EEG - strongly recommend neurology consult if she doesn't wake up -Ammonia is improving with lactulose.  -Continue to monitor.  - CT head with no acute findings  Discussed with intensivist today. He is managing medically on this patient. So hospitalists will sign off.   All the records are reviewed and case discussed with Care Management/Social Workerr. Management plans discussed with the patient, family and they are in agreement.  CODE STATUS: Full Code  TOTAL CRITICAL CARE TIME SPENT IN TAKING CARE OF THIS PATIENT: 40 minutes.     Enid Baas M.D on 01/07/2015 at 12:19 PM  Between 7am to 6pm - Pager - [phone]  After 6pm go to [email or web address] - password [email or web address]  [facility]  Office  [phone]  CC: Primary care physician; Pcp Not In System

## 2015-01-07 NOTE — Consult Note (Signed)
[facility]ious [facility]     Reason for Consult: Fevers    Referring Physician: Antoine Primas Date of Admission:  12/30/2014   Active Problems:   Perforated viscus   Acute respiratory failure (HCC)   HPI: Brandy Robinson is a 49 y.o. female with hx depression, anxiety, gastric reflux, insomnia, bipolar admitted 12/13 with severe abd pain, nv. Found to have free air, thickened gastric wall on CT. Underwent emergent surgery with finding of perforated peptic ulcer which was repaired with Phillip Heal patch. She has has remained in ICU since that time with agitation, tachycardia, and worsening resp status requiring intubation 12/18.  12/20 developed fever to 100.8. Not on pressors, min trach secretions.   Past Medical History  Diagnosis Date  . Wears glasses   . Depression   . Anxiety   . GERD (gastroesophageal reflux disease)   . Vaginal atrophy   . Lichen   . Surgical menopause   . Endometriosis   . Dyspareunia   . Insomnia   . Bipolar disorder [facility])    Past Surgical History  Procedure Laterality Date  . Abdominal hysterectomy  1995    [place]  . Arm wound repair / closure  2013    cellulitis rt arm-i/d  . Knee bursectomy Left 09/24/2013    Procedure: IRRIGATION AND DEBRIDEMENT OF LEFT KNEE ;  Surgeon: Renette Butters, MD;  Location: [place];  Service: Orthopedics;  Laterality: Left;  . Laparotomy N/A 12/30/2014    Procedure: EXPLORATORY LAPAROTOMY-graham patch of peptic ulcer;  Surgeon: Clayburn Pert, MD;  Location: [facility];  Service: General;  Laterality: N/A;   Social History  Substance Use Topics  . Smoking status: Current Every Day Smoker -- 1.00 packs/day    Types: Cigarettes  . Smokeless tobacco: None  . Alcohol Use: No   Family History  Problem Relation Age of Onset  . Diabetes Mother   . Diabetes Sister   . Cancer Neg Hx   . Heart disease Neg Hx     Allergies:  Allergies  Allergen Reactions  . Hydrocodone Itching  . Aspirin  Other (See Comments)    Reaction:  GI upset   . Morphine And Related Itching, Nausea And Vomiting and Other (See Comments)    Reaction:  Chest pain    Current antibiotics: Antibiotics Given (last 72 hours)    Date/Time Action Medication Dose Rate   01/04/15 1730 Given   piperacillin-tazobactam (ZOSYN) IVPB 3.375 g 3.375 g 12.5 mL/hr   01/05/15 0235 Given   piperacillin-tazobactam (ZOSYN) IVPB 3.375 g 3.375 g 12.5 mL/hr   01/05/15 1151 Given   piperacillin-tazobactam (ZOSYN) IVPB 3.375 g 3.375 g 12.5 mL/hr   01/05/15 1732 Given   piperacillin-tazobactam (ZOSYN) IVPB 3.375 g 3.375 g 12.5 mL/hr   01/06/15 0200 Given   piperacillin-tazobactam (ZOSYN) IVPB 3.375 g 3.375 g 12.5 mL/hr   01/06/15 1417 Given  [awaited pharmacy delivery]   piperacillin-tazobactam (ZOSYN) IVPB 3.375 g 3.375 g 12.5 mL/hr   01/06/15 2200 Given   piperacillin-tazobactam (ZOSYN) IVPB 3.375 g 3.375 g 12.5 mL/hr   01/07/15 0230 Given   piperacillin-tazobactam (ZOSYN) IVPB 3.375 g 3.375 g 12.5 mL/hr   01/07/15 1116 Given   piperacillin-tazobactam (ZOSYN) IVPB 3.375 g 3.375 g 12.5 mL/hr      MEDICATIONS: . antiseptic oral rinse  7 mL Mouth Rinse QID  . budesonide (PULMICORT) nebulizer solution  0.5 mg Nebulization BID  . buPROPion  100 mg Per Tube TID  . busPIRone  10 mg Per Tube TID  . chlorhexidine gluconate  15 mL Mouth Rinse BID  . citalopram  10 mg Per Tube QHS  . clonazePAM  1 mg Per Tube BID  . feeding supplement (PRO-STAT SUGAR FREE 64)  30 mL Oral BID  . feeding supplement (VITAL HIGH PROTEIN)  1,000 mL Per Tube Q24H  . free water  100 mL Per Tube 6 X Daily  . insulin aspart  0-15 Units Subcutaneous 6 times per day  . ipratropium-albuterol  3 mL Nebulization Q6H  . lactulose  30 g Per Tube BID  . pantoprazole (PROTONIX) IV  40 mg Intravenous Q24H  . piperacillin-tazobactam (ZOSYN)  IV  3.375 g Intravenous 3 times per day  . QUEtiapine  100 mg Per Tube BID   Review of Systems - unable to  obtain  OBJECTIVE: Temp:  [99.6 F (37.6 C)-101 F (38.3 C)] 99.6 F (37.6 C) (12/21 1200) Pulse Rate:  [87-120] 91 (12/21 1100) Resp:  [14-24] 15 (12/21 1100) BP: (93-115)/(47-58) 114/57 mmHg (12/21 1100) SpO2:  [97 %-100 %] 100 % (12/21 1100) FiO2 (%):  [35 %-40 %] 35 % (12/21 1321) Weight:  [74.2 kg (163 lb 9.3 oz)] 74.2 kg (163 lb 9.3 oz) (12/21 0500) Physical Exam  Constitutional: Intubated, NGT in place.  Access - no central line, has foley, NGT  HENT: [place]/AT, PERRLA, no scleral icterus Mouth/Throat: ETT in place  Cardiovascular: Normal rate, regular rhythm and normal heart sounds.  Pulmonary/Chest: mec BS  Neck  supple, no nuchal rigidity Abdominal: distended, not rigid, + BS, Incision cdi, 2 JP drians with thin serous fluid Lymphadenopathy: no cervical adenopathy. No axillary adenopathy Neurological: intubated, not very interactive despite not on sedation  Skin: Skin is warm and dry. No rash noted. No erythema.  Psychiatric: unable to assess  LABS: Results for orders placed or performed during the hospital encounter of 12/30/14 (from the past 48 hour(s))  Glucose, capillary     Status: Abnormal   Collection Time: 01/05/15  4:26 PM  Result Value Ref Range   Glucose-Capillary 181 (H) 65 - 99 mg/dL  Glucose, capillary     Status: Abnormal   Collection Time: 01/05/15  8:06 PM  Result Value Ref Range   Glucose-Capillary 150 (H) 65 - 99 mg/dL  Glucose, capillary     Status: Abnormal   Collection Time: 01/06/15 12:13 AM  Result Value Ref Range   Glucose-Capillary 160 (H) 65 - 99 mg/dL  CBC     Status: Abnormal   Collection Time: 01/06/15  4:30 AM  Result Value Ref Range   WBC 13.5 (H) 3.6 - 11.0 K/uL   RBC 2.69 (L) 3.80 - 5.20 MIL/uL   Hemoglobin 7.7 (L) 12.0 - 16.0 g/dL   HCT 24.4 (L) 35.0 - 47.0 %   MCV 90.7 80.0 - 100.0 fL   MCH 28.7 26.0 - 34.0 pg   MCHC 31.7 (L) 32.0 - 36.0 g/dL   RDW 15.2 (H) 11.5 - 14.5 %   Platelets 344 150 - 440 K/uL  Comprehensive  metabolic panel     Status: Abnormal   Collection Time: 01/06/15  4:30 AM  Result Value Ref Range   Sodium 147 (H) 135 - 145 mmol/L   Potassium 3.5 3.5 - 5.1 mmol/L   Chloride 113 (H) 101 - 111 mmol/L   CO2 28 22 - 32 mmol/L   Glucose, Bld 137 (H) 65 - 99 mg/dL   BUN 27 (H) 6 - 20 mg/dL  Creatinine, Ser 0.66 0.44 - 1.00 mg/dL   Calcium 7.7 (L) 8.9 - 10.3 mg/dL   Total Protein 5.9 (L) 6.5 - 8.1 g/dL   Albumin 2.1 (L) 3.5 - 5.0 g/dL   AST 41 15 - 41 U/L   ALT 43 14 - 54 U/L   Alkaline Phosphatase 97 38 - 126 U/L   Total Bilirubin 0.5 0.3 - 1.2 mg/dL   GFR calc non Af Amer >60 >60 mL/min   GFR calc Af Amer >60 >60 mL/min    Comment: (NOTE) The eGFR has been calculated using the CKD EPI equation. This calculation has not been validated in all clinical situations. eGFR's persistently <60 mL/min signify possible Chronic Kidney Disease.    Anion gap 6 5 - 15  Glucose, capillary     Status: Abnormal   Collection Time: 01/06/15  4:30 AM  Result Value Ref Range   Glucose-Capillary 125 (H) 65 - 99 mg/dL  Ammonia     Status: Abnormal   Collection Time: 01/06/15  7:48 AM  Result Value Ref Range   Ammonia 51 (H) 9 - 35 umol/L  Glucose, capillary     Status: Abnormal   Collection Time: 01/06/15  8:14 AM  Result Value Ref Range   Glucose-Capillary 128 (H) 65 - 99 mg/dL  Glucose, capillary     Status: Abnormal   Collection Time: 01/06/15 12:08 PM  Result Value Ref Range   Glucose-Capillary 148 (H) 65 - 99 mg/dL  Glucose, capillary     Status: Abnormal   Collection Time: 01/06/15  4:11 PM  Result Value Ref Range   Glucose-Capillary 157 (H) 65 - 99 mg/dL  Glucose, capillary     Status: Abnormal   Collection Time: 01/06/15  7:35 PM  Result Value Ref Range   Glucose-Capillary 127 (H) 65 - 99 mg/dL   Comment 1 Notify RN   Glucose, capillary     Status: Abnormal   Collection Time: 01/07/15 12:19 AM  Result Value Ref Range   Glucose-Capillary 138 (H) 65 - 99 mg/dL   Comment 1 Notify  RN   Glucose, capillary     Status: Abnormal   Collection Time: 01/07/15  3:53 AM  Result Value Ref Range   Glucose-Capillary 111 (H) 65 - 99 mg/dL   Comment 1 Notify RN   CBC     Status: Abnormal   Collection Time: 01/07/15  4:23 AM  Result Value Ref Range   WBC 13.4 (H) 3.6 - 11.0 K/uL   RBC 2.87 (L) 3.80 - 5.20 MIL/uL   Hemoglobin 8.2 (L) 12.0 - 16.0 g/dL   HCT 26.1 (L) 35.0 - 47.0 %   MCV 91.0 80.0 - 100.0 fL   MCH 28.6 26.0 - 34.0 pg   MCHC 31.4 (L) 32.0 - 36.0 g/dL   RDW 15.4 (H) 11.5 - 14.5 %   Platelets 326 150 - 440 K/uL  Basic metabolic panel     Status: Abnormal   Collection Time: 01/07/15  4:23 AM  Result Value Ref Range   Sodium 141 135 - 145 mmol/L   Potassium 4.0 3.5 - 5.1 mmol/L   Chloride 104 101 - 111 mmol/L   CO2 32 22 - 32 mmol/L   Glucose, Bld 128 (H) 65 - 99 mg/dL   BUN 20 6 - 20 mg/dL   Creatinine, Ser 0.61 0.44 - 1.00 mg/dL   Calcium 7.6 (L) 8.9 - 10.3 mg/dL   GFR calc non Af Amer >60 >60 mL/min  GFR calc Af Amer >60 >60 mL/min    Comment: (NOTE) The eGFR has been calculated using the CKD EPI equation. This calculation has not been validated in all clinical situations. eGFR's persistently <60 mL/min signify possible Chronic Kidney Disease.    Anion gap 5 5 - 15  Ammonia     Status: Abnormal   Collection Time: 01/07/15  4:23 AM  Result Value Ref Range   Ammonia 44 (H) 9 - 35 umol/L  Glucose, capillary     Status: Abnormal   Collection Time: 01/07/15  7:31 AM  Result Value Ref Range   Glucose-Capillary 113 (H) 65 - 99 mg/dL  Glucose, capillary     Status: Abnormal   Collection Time: 01/07/15 11:49 AM  Result Value Ref Range   Glucose-Capillary 165 (H) 65 - 99 mg/dL   No components found for: ESR, C REACTIVE PROTEIN MICRO: Recent Results (from the past 720 hour(s))  Blood Culture (routine x 2)     Status: None   Collection Time: 12/30/14  5:37 PM  Result Value Ref Range Status   Specimen Description BLOOD RIGHT ASSIST CONTROL  Final    Special Requests   Final    BOTTLES DRAWN AEROBIC AND ANAEROBIC  3CC AERO, 2 ANAERO   Culture NO GROWTH 5 DAYS  Final   Report Status 01/04/2015 FINAL  Final  Blood Culture (routine x 2)     Status: None   Collection Time: 12/30/14  5:40 PM  Result Value Ref Range Status   Specimen Description BLOOD LEFT HAND  Final   Special Requests   Final    BOTTLES DRAWN AEROBIC AND ANAEROBIC  3CC AERO, [place] ANAERO   Culture NO GROWTH 5 DAYS  Final   Report Status 01/04/2015 FINAL  Final  Urine culture     Status: None   Collection Time: 12/30/14  7:45 PM  Result Value Ref Range Status   Specimen Description URINE, RANDOM  Final   Special Requests NONE  Final   Culture NO GROWTH  Final   Report Status 01/01/2015 FINAL  Final  MRSA PCR Screening     Status: None   Collection Time: 12/31/14 12:24 PM  Result Value Ref Range Status   MRSA by PCR NEGATIVE NEGATIVE Final    Comment:        The GeneXpert MRSA Assay (FDA approved for NASAL specimens only), is one component of a comprehensive MRSA colonization surveillance program. It is not intended to diagnose MRSA infection nor to guide or monitor treatment for MRSA infections.   Culture, respiratory (NON-Expectorated)     Status: None   Collection Time: 01/04/15  6:22 PM  Result Value Ref Range Status   Specimen Description TRACHEAL ASPIRATE  Final   Special Requests Normal  Final   Gram Stain   Final    FEW WBC SEEN NO ORGANISMS SEEN FAIR SPECIMEN - 70-80% WBCS    Culture NO GROWTH 2 DAYS  Final   Report Status 01/07/2015 FINAL  Final    IMAGING: Dg Chest 1 View  12/30/2014  CLINICAL DATA:  Abdominal pain, nausea and vomiting beginning last night. Initial encounter. EXAM: CHEST 1 VIEW COMPARISON:  PA and lateral chest 08/19/2011 and 03/22/2008. FINDINGS: Lung volumes are low with basilar atelectasis. No pneumothorax or pleural effusion. Heart size is normal. IMPRESSION: Low lung volumes with bibasilar atelectasis.  No acute  abnormality. Electronically Signed   By: Inge Rise M.D.   On: 12/30/2014 15:40   Dg Abd  1 View  01/05/2015  CLINICAL DATA:  49 year old female with nasogastric tube placement. Subsequent encounter. EXAM: ABDOMEN - 1 VIEW COMPARISON:  01/04/2015. FINDINGS: Multiple overlying leads and surgical drainage catheters are in place. Nasogastric tube side hole just beyond the gastroesophageal junction with the tip of the nasogastric tube at the gastric body level (greater curvature). Gas-filled nondilated stomach. Basilar atelectasis greater on the left. IMPRESSION: Nasogastric tube tip gastric body level with the side hole just beyond the gastroesophageal junction. Electronically Signed   By: Genia Del M.D.   On: 01/05/2015 10:43   Dg Abd 1 View  01/04/2015  CLINICAL DATA:  49 year old female with OG tube placement history of gastroesophageal reflux disease. EXAM: ABDOMEN - 1 VIEW COMPARISON:  Radiograph dated 27 of FINDINGS: Partially visualized enteric tube is noted over the lower esophagus likely at the gastroesophageal junction. Recommend advancement of the tube into the stomach. Three drainage tubes are again identified in the visualized abdomen. Similar to prior study. No evidence of bowel obstruction. The osseous structures are grossly unremarkable. IMPRESSION: Enteric tube is partially visualized and appears over the distal esophagus or gastroesophageal junction. Recommend advancement into the stomach. Electronically Signed   By: Anner Crete M.D.   On: 01/04/2015 19:15   Dg Abd 1 View  01/04/2015  CLINICAL DATA:  Status post orogastric tube placement. EXAM: ABDOMEN - 1 VIEW COMPARISON:  01/04/2015 at 1617 hours FINDINGS: The tip of the orogastric tube projects in the distal esophagus. It will need to be inserted at least 10 cm to allow the tube to extend well into stomach. No other change from prior study. Postsurgical changes are again noted. IMPRESSION: Orogastric tube tip projects in  the distal esophagus and does not enter the stomach. Electronically Signed   By: Lajean Manes M.D.   On: 01/04/2015 19:09   Dg Abd 1 View  01/04/2015  CLINICAL DATA:  Patient in CCU. Interval removal of the NG tube. Abdominal distention. EXAM: ABDOMEN - 1 VIEW COMPARISON:  12/30/2014 FINDINGS: Surgical drains within the central abdomen and left abdomen are identified. Skin staples are noted along the midline of the abdomen. Mild gaseous distension of large bowel loops noted. No abnormal small bowel dilatation identified. IMPRESSION: 1. No evidence for bowel obstruction. 2. Mild gaseous distension of large bowel loops. Electronically Signed   By: Kerby Moors M.D.   On: 01/04/2015 16:54   Ct Head Wo Contrast  01/04/2015  CLINICAL DATA:  Altered mental status.  Elevated ammonia levels. EXAM: CT HEAD WITHOUT CONTRAST TECHNIQUE: Contiguous axial images were obtained from the base of the skull through the vertex without intravenous contrast. COMPARISON:  Report of CT head without contrast 03/16/2002 FINDINGS: Basal ganglia are intact bilaterally. The insular ribbon is normal. No acute cortical infarcts are present. Mild periventricular white matter changes are noted bilaterally. Mild mucosal thickening is present in the right maxillary sinus. The remaining paranasal sinuses and the mastoid air cells are clear. IMPRESSION: 1. Mild periventricular white matter changes bilaterally. This is nonspecific, but may represent microvascular ischemic change. 2. No acute intracranial abnormality. 3. Minimal right maxillary sinus disease. Electronically Signed   By: San Morelle M.D.   On: 01/04/2015 15:55   Dg Chest Port 1 View  01/07/2015  CLINICAL DATA:  Acute respiratory failure, perforated viscus EXAM: PORTABLE CHEST 1 VIEW COMPARISON:  Portable chest x-ray of January 06, 2015 FINDINGS: The lung volumes remain low. The interstitial markings remain increased with areas of confluence. The retrocardiac  region  on the left is slightly less dense today in the left hemidiaphragm is now partially visible. A small left pleural effusion remains The heart is normal in size. The pulmonary vascularity is not clearly engorged. The endotracheal tube tip lies 2.8 cm above the carina. The esophagogastric tube tip projects below the inferior margin of the image. IMPRESSION: Persistent hypoinflation. Slight interval improvement in pulmonary interstitial edema and left lower lobe atelectasis or pneumonia. There is no pneumothorax or large pleural effusion. The support tubes are in reasonable position. Electronically Signed   By: Alic Hilburn  [place] M.D.   On: 01/07/2015 07:26   Dg Chest Port 1 View  01/06/2015  CLINICAL DATA:  Respiratory failure, perforated viscus. EXAM: PORTABLE CHEST 1 VIEW COMPARISON:  Portable chest x-ray of January 04, 2015 FINDINGS: The lung volumes are low. The pulmonary interstitial markings are increased bilaterally. The pulmonary vascularity is engorged and indistinct. The cardiac silhouette is top-normal in size. There is small left pleural effusion. The endotracheal tube tip lies 3 cm above the carina. The esophagogastric tube tip projects below the inferior margin of the image. IMPRESSION: Allowing for a bilateral hypoinflation there has not been significant interval change in the appearance of the chest. Persistent fluffy alveolar pneumonia or edema remain. There is a persistent small left pleural effusion. The support tubes are in reasonable position. Electronically Signed   By: Kadie Balestrieri  [place] M.D.   On: 01/06/2015 07:37   Dg Chest Port 1 View  01/04/2015  CLINICAL DATA:  49 year old female with respiratory failure and ET tube placement. EXAM: PORTABLE CHEST 1 VIEW COMPARISON:  Radiograph dated 01/04/2015 FINDINGS: There has been interval placement of an endotracheal tube with tip approximately 1.5 cm above the carina. Recommend retraction and repositioning of the tube by approximately 4 cm for  optimal positioning. An endotracheal tube is partially visualized coursing to the upper abdomen. The tip of the endotracheal tube is poorly visualized but appears to be over the lower mediastinum likely at the gastroesophageal junction. Recommend advancing the tube into the stomach and follow-up imaging to confirm positioning. Diffuse central vascular prominence as well as the areas of central airspace opacity noted. A small left pleural effusion may be present. There is no pneumothorax. The cardiac silhouette is within normal limits. The osseous structures are grossly unremarkable. IMPRESSION: Interval placement of an endotracheal and enteric tube. Recommend retraction of the endotracheal tube by approximately 4 cm and advancement of the enteric tube into the stomach for optimal positioning. Central vascular congestion and pulmonary opacities. Electronically Signed   By: Anner Crete M.D.   On: 01/04/2015 19:14   Dg Chest Port 1 View  01/04/2015  CLINICAL DATA:  Congestive failure EXAM: PORTABLE CHEST - 1 VIEW COMPARISON:  01/02/2015 FINDINGS: Cardiac shadow is within normal limits. Diffuse central vascular congestion and parenchymal edema is noted consistent with CHF. No bony abnormality is noted. IMPRESSION: CHF Electronically Signed   By: Inez Catalina M.D.   On: 01/04/2015 08:45   Dg Chest Port 1 View  01/02/2015  CLINICAL DATA:  49 year old ICU patient with hypoxia status post abdominal laparotomy for perforated peptic ulcer. Postoperative day 3. Initial encounter. EXAM: PORTABLE CHEST 1 VIEW COMPARISON:  01/01/2015 and earlier FINDINGS: Portable AP semi upright view at 0524 hours. Enteric tube courses to the abdomen, tip not included. No endotracheal tube. Acute bilateral perihilar opacity seen yesterday is less extensive, but now more confluent about the hila. Stable lung volumes. Stable cardiac size and mediastinal contours. No superimposed pneumothorax. Left  lower lobe ventilation has improved  but there are residual retrocardiac air bronchograms. Small left pleural effusions suspected. No areas of worsening ventilation. IMPRESSION: Mild improved ventilation since yesterday. More confluent but less extensive perihilar opacity with some air bronchograms. Favor resolving aspiration over edema or ARDS. Small left pleural effusion suspected. Electronically Signed   By: Genevie Ann M.D.   On: 01/02/2015 07:28   Dg Chest Port 1 View  01/01/2015  CLINICAL DATA:  Acute respiratory failure. EXAM: PORTABLE CHEST 1 VIEW COMPARISON:  12/30/2014; CT the chest, abdomen pelvis - 01/01/2015 FINDINGS: Interval development of extensive bilateral perihilar predominant heterogeneous airspace opacities with relative sparing of the lung periphery and obscuration of the cardiac silhouette and mediastinal contours. Suspected development of small bilateral effusions, left greater than right. Enteric tube terminates below the left hemidiaphragm. No acute osseous abnormalities. IMPRESSION: Findings worrisome for flash alveolar pulmonary edema though note, extensive aspiration could have a similar appearance. Continued attention on follow-up is recommended. Electronically Signed   By: Sandi Mariscal M.D.   On: 01/01/2015 13:54   Ct Angio Chest Aorta W/cm &/or Wo/cm  12/30/2014  CLINICAL DATA:  Patient brought in by [facility] c/o hypotension, chest pain, abdominal pain, nausea, and vomiting that started last night around 10 pm. Patient denies diarrhea. EXAM: CT ANGIOGRAPHY CHEST, ABDOMEN AND PELVIS TECHNIQUE: Multidetector CT imaging through the chest, abdomen and pelvis was performed using the standard protocol during bolus administration of intravenous contrast. Multiplanar reconstructed images and MIPs were obtained and reviewed to evaluate the vascular anatomy. CONTRAST:  132m OMNIPAQUE IOHEXOL 350 MG/ML SOLN COMPARISON:  05/04/2014 FINDINGS: CHEST The noncontrast scout shows no hyperdense crescent, mediastinal hematoma, pleural or  pericardial effusion. Scattered aortic arch calcifications. Right arm IV contrast injection. The SVC is patent. RV is nondilated. Satisfactory opacification of pulmonary arteries noted, and there is no evidence of pulmonary emboli. Patent pulmonary veins. Adequate contrast opacification of the thoracic aorta with no evidence of dissection, aneurysm, or stenosis. There is classic 3-vessel brachiocephalic arch anatomy without proximal stenosis. Calcified right hilar and subcarinal lymph nodes. No mediastinal adenopathy. Linear subsegmental atelectasis or scarring in the right middle and lower lobes, and inferior lingula. Review of the MIP images confirms the above findings. ABDOMEN AND PELVIS Arterial findings: Aorta: Scattered calcified plaque. Focal penetrating atheromatous ulcer or limited nonocclusive dissection in the infrarenal segment. No aneurysm or stenosis. Celiac axis:         Patent Superior mesenteric: Patent, with replaced right hepatic arterial supply, an anatomic variant. Left renal:          Duplicated, superior dominant, both patent. Right renal:         Single, patent. Inferior mesenteric: Patent, with origin stenosis related to aortic wall plaque. Left iliac: Eccentric calcified nonocclusive plaque through the common and internal iliac arteries. External iliac widely patent. Right iliac: Scattered eccentric calcified plaque through the common iliac into the proximal internal iliac artery. External iliac widely patent. Venous findings: Dedicated venous phase imaging not obtained. Note made of patent portal, splenic and renal veins. Review of the MIP images confirms the above findings. Nonvascular findings: Scattered freed intraperitoneal air. Mild perihepatic and perisplenic ascites. Stomach is incompletely distended, with some wall thickening suggested in the body and antrum. There is some focal gas collection contiguous with the mucosa of the gastric antrum suggesting possible gastric ulceration  as the source of free intraperitoneal gas and fluid. Small bowel and colon are nondilated. Appendix not identified. No significant diverticular disease. Unremarkable  liver, gallbladder, spleen and accessory splenules, adrenal glands, kidneys, pancreas. Urinary bladder is nondistended. Lumbar spine and bony pelvis unremarkable. IMPRESSION: 1. Free intraperitoneal air and fluid in the upper abdomen consistent with perforated viscus. Apparent mucosal defect in the gastric antrum and associated gas collection suggest perforated gastric ulcer. Critical Value/emergent results were called by telephone at the time of interpretation on 12/30/2014 at 5:09 pm to Dr. Larae Grooms , who verbally acknowledged these results. 2. Negative for acute PE or thoracic aortic dissection. 3. Linear scarring or atelectasis in the lung bases right greater than left. 4. Age-advanced aortoiliac atheromatous plaque. Electronically Signed   By: Lucrezia Europe M.D.   On: 12/30/2014 17:09   Ct Angio Abd/pel W/ And/or W/o  12/30/2014  CLINICAL DATA:  Patient brought in by [facility] c/o hypotension, chest pain, abdominal pain, nausea, and vomiting that started last night around 10 pm. Patient denies diarrhea. EXAM: CT ANGIOGRAPHY CHEST, ABDOMEN AND PELVIS TECHNIQUE: Multidetector CT imaging through the chest, abdomen and pelvis was performed using the standard protocol during bolus administration of intravenous contrast. Multiplanar reconstructed images and MIPs were obtained and reviewed to evaluate the vascular anatomy. CONTRAST:  17m OMNIPAQUE IOHEXOL 350 MG/ML SOLN COMPARISON:  05/04/2014 FINDINGS: CHEST The noncontrast scout shows no hyperdense crescent, mediastinal hematoma, pleural or pericardial effusion. Scattered aortic arch calcifications. Right arm IV contrast injection. The SVC is patent. RV is nondilated. Satisfactory opacification of pulmonary arteries noted, and there is no evidence of pulmonary emboli. Patent pulmonary veins.  Adequate contrast opacification of the thoracic aorta with no evidence of dissection, aneurysm, or stenosis. There is classic 3-vessel brachiocephalic arch anatomy without proximal stenosis. Calcified right hilar and subcarinal lymph nodes. No mediastinal adenopathy. Linear subsegmental atelectasis or scarring in the right middle and lower lobes, and inferior lingula. Review of the MIP images confirms the above findings. ABDOMEN AND PELVIS Arterial findings: Aorta: Scattered calcified plaque. Focal penetrating atheromatous ulcer or limited nonocclusive dissection in the infrarenal segment. No aneurysm or stenosis. Celiac axis:         Patent Superior mesenteric: Patent, with replaced right hepatic arterial supply, an anatomic variant. Left renal:          Duplicated, superior dominant, both patent. Right renal:         Single, patent. Inferior mesenteric: Patent, with origin stenosis related to aortic wall plaque. Left iliac: Eccentric calcified nonocclusive plaque through the common and internal iliac arteries. External iliac widely patent. Right iliac: Scattered eccentric calcified plaque through the common iliac into the proximal internal iliac artery. External iliac widely patent. Venous findings: Dedicated venous phase imaging not obtained. Note made of patent portal, splenic and renal veins. Review of the MIP images confirms the above findings. Nonvascular findings: Scattered freed intraperitoneal air. Mild perihepatic and perisplenic ascites. Stomach is incompletely distended, with some wall thickening suggested in the body and antrum. There is some focal gas collection contiguous with the mucosa of the gastric antrum suggesting possible gastric ulceration as the source of free intraperitoneal gas and fluid. Small bowel and colon are nondilated. Appendix not identified. No significant diverticular disease. Unremarkable liver, gallbladder, spleen and accessory splenules, adrenal glands, kidneys, pancreas.  Urinary bladder is nondistended. Lumbar spine and bony pelvis unremarkable. IMPRESSION: 1. Free intraperitoneal air and fluid in the upper abdomen consistent with perforated viscus. Apparent mucosal defect in the gastric antrum and associated gas collection suggest perforated gastric ulcer. Critical Value/emergent results were called by telephone at the time of interpretation on 12/30/2014 at  5:09 pm to Dr. Larae Grooms , who verbally acknowledged these results. 2. Negative for acute PE or thoracic aortic dissection. 3. Linear scarring or atelectasis in the lung bases right greater than left. 4. Age-advanced aortoiliac atheromatous plaque. Electronically Signed   By: Lucrezia Europe M.D.   On: 12/30/2014 17:09    Assessment:   Brandy Robinson is a 49 y.o. female depression, anxiety, gastric reflux, insomnia, bipolar admitted with acute perforation of peptic ulcer s/p surgery 05//18 with complicated post op course currently intubated but not on pressors, with new fevers and wbc 13.  Possible sources - Post op abd abscess, PNA (CXR with LLL atelectasis or infitlrate but minimal trach secretions) sinusitis (NGT in place), fungemia (no line in place, UTI (foley in place), med effect (zosyn, psych meds), wound infection (wound looks very good).  ABX course Zosyn 12/13-->>  Recommendations Check bcx, ucx, UA, HIV Start antifungal coverage Check CT abd pelvis Continue zosyn pending above results however if becomes unstable would change to meropenem and add vancomycin. Thank you very much for allowing me to participate in the care of this patient. Please call with questions.   Cheral Marker. Ola Spurr, MD

## 2015-01-07 NOTE — Progress Notes (Signed)
PULMONARY / CRITICAL CARE MEDICINE   Name: Brandy Robinson MRN: [id number] DOB: 02/09/1965    ADMISSION DATE:  12/30/2014  BRIEF HISTORY: 49 year old female past medical history of depression, anxiety, gastric reflux, insomnia, bipolar, takes ibuprofen and powders for pain at home. Admitted with severe abdominal pain, found to have free air in the peritoneum, taken today or for perforated gastric ulcer surgery with worsening confusion and hypotension, transferred to ICU with elevated heart rate, status post open laparotomy for perforated viscus..  SUBJECTIVE:  Passed SBT this AM but was still too sedated to extubate. Scheduled to have CT A/P this afternoon. Spiked fever on Abx.  POD 8  VITAL SIGNS: Temp:  [99.6 F (37.6 C)-101 F (38.3 C)] 99.6 F (37.6 C) (12/21 1200) Pulse Rate:  [87-120] 91 (12/21 1100) Resp:  [14-24] 15 (12/21 1100) BP: (93-115)/(42-58) 114/57 mmHg (12/21 1100) SpO2:  [96 %-100 %] 100 % (12/21 1100) FiO2 (%):  [35 %-40 %] 35 % (12/21 1321) Weight:  [163 lb 9.3 oz (74.2 kg)] 163 lb 9.3 oz (74.2 kg) (12/21 0500) HEMODYNAMICS:   VENTILATOR SETTINGS: Vent Mode:  [-] Spontaneous FiO2 (%):  [35 %-40 %] 35 % Set Rate:  [16 bmp] 16 bmp Vt Set:  [420 mL] 420 mL PEEP:  [5 cmH20] 5 cmH20 Pressure Support:  [10 cmH20] 10 cmH20 INTAKE / OUTPUT:  Intake/Output Summary (Last 24 hours) at 01/07/15 1348 Last data filed at 01/07/15 1200  Gross per 24 hour  Intake 3592.48 ml  Output   1750 ml  Net 1842.48 ml    Review of Systems  Unable to perform ROS: mental acuity    Physical Exam  Constitutional: She is well-developed, well-nourished, and in no distress.  HENT:  Head: Normocephalic and atraumatic.  Eyes: Conjunctivae and EOM are normal. Pupils are equal, round, and reactive to light.  Neck: No JVD present. No thyromegaly present.  Cardiovascular: Normal rate, regular rhythm, normal heart sounds and intact distal pulses.  Exam reveals no friction rub.   No  murmur heard. Pulmonary/Chest: Breath sounds normal. She has no wheezes. She has no rales.  Abdominal: She exhibits distension. There is no rebound and no guarding.  Incision intact, no purulent drainage, bandages in place  Musculoskeletal: She exhibits no edema.  Lymphadenopathy:    She has no cervical adenopathy.  Neurological: She has normal reflexes. No cranial nerve deficit.  Confused, altered mental status  Skin: Skin is warm and dry.     LABS:  CBC  Recent Labs Lab 01/05/15 0521 01/06/15 0430 01/07/15 0423  WBC 8.4 13.5* 13.4*  HGB 7.4* 7.7* 8.2*  HCT 23.3* 24.4* 26.1*  PLT 299 344 326   Coag's  Recent Labs Lab 01/02/15 0501  INR 1.19   BMET  Recent Labs Lab 01/05/15 0521 01/06/15 0430 01/07/15 0423  NA 145 147* 141  K 3.3* 3.5 4.0  CL 109 113* 104  CO2 29 28 32  BUN 36* 27* 20  CREATININE 0.80 0.66 0.61  GLUCOSE 296* 137* 128*   Electrolytes  Recent Labs Lab 01/05/15 0521 01/06/15 0430 01/07/15 0423  CALCIUM 7.7* 7.7* 7.6*   Sepsis Markers No results for input(s): LATICACIDVEN, PROCALCITON, O2SATVEN in the last 168 hours. ABG  Recent Labs Lab 01/04/15 1100 01/04/15 1610 01/04/15 2125  PHART 7.47* 7.46* 7.51*  PCO2ART 43 44 42  PO2ART 66* 66* 131*   Liver Enzymes  Recent Labs Lab 01/06/15 0430  AST 41  ALT 43  ALKPHOS 97  BILITOT 0.5  ALBUMIN 2.1*   Cardiac Enzymes No results for input(s): TROPONINI, PROBNP in the last 168 hours. Glucose  Recent Labs Lab 01/06/15 1611 01/06/15 1935 01/07/15 0019 01/07/15 0353 01/07/15 0731 01/07/15 1149  GLUCAP 157* 127* 138* 111* 113* 165*    Imaging Dg Chest Port 1 View  01/07/2015  CLINICAL DATA:  Acute respiratory failure, perforated viscus EXAM: PORTABLE CHEST 1 VIEW COMPARISON:  Portable chest x-ray of January 06, 2015 FINDINGS: The lung volumes remain low. The interstitial markings remain increased with areas of confluence. The retrocardiac region on the left is  slightly less dense today in the left hemidiaphragm is now partially visible. A small left pleural effusion remains The heart is normal in size. The pulmonary vascularity is not clearly engorged. The endotracheal tube tip lies 2.8 cm above the carina. The esophagogastric tube tip projects below the inferior margin of the image. IMPRESSION: Persistent hypoinflation. Slight interval improvement in pulmonary interstitial edema and left lower lobe atelectasis or pneumonia. There is no pneumothorax or large pleural effusion. The support tubes are in reasonable position. Electronically Signed   By: David  [place] M.D.   On: 01/07/2015 07:26    LINES/TUBES: ETT 12/18 >>   MICRO: MRSA PCR 12/14 >> NEG Urine 12/13 >> NEG Blood 12/13 >> NEG resp 12/18 >> NEG Blood 12/21>>  ANTIBIOTICS Zosyn 12/14 >>   ASSESSMENT / PLAN: Post laparotomy 12/13 for perforated gastric ulcer Acute resp failure - intubated for extreme agitation and concomitant respiratory distress Bilateral pulmonary infiltrates on CXR - edema vs PNA vs ALI/ARDS  Note normal LVEF on Echocardiogram 12/15 Hypernatremia Hypokalemia Elevated ammonia level of unclear significance Severe sepsis, peritonitis Anemia without overt bleeding presently Bipolar disorder with multiple chronic psych meds Severe intermittent agitated delirium  Cont vent support - settings reviewed and/or adjusted Wean on PSV mode as tolerated Cont vent bundle Daily WUA/SBT Monitor BMET intermittently Monitor I/Os Correct electrolytes as indicated IVFs adjusted 12/20 Lasix X 1 12/20 SUP: IV pantoprazole NGT placed 12/19 Cont TFs DVT px: SCDs Monitor CBC intermittently Transfuse per usual guidelines Monitor temp, WBC count Micro and abx as above resume quetiapine, buspirone, bupropion Continue precedex- RASS goal 0, -1 Cont PRN lorazepam, fentanyl as ordered Redraw blood cultures - spiking fevers on Zosyn Repeat CT A\P today.    CCM time: 40  mins The above time includes time spent in consultation with patient and/or family members and reviewing care plan on multidisciplinary rounds  Stephanie Acre, MD [facility] Pager [phone] (please enter 7-digits) On Call Pager - [phone] (please enter 7-digits)

## 2015-01-07 NOTE — Progress Notes (Signed)
Nutrition Follow-up    INTERVENTION:   EN: recommend increasing to rate of 35 ml/hr with goal rate of 50 ml/hr as pt off diprivan, continue Prostat BID. Pt with additional kcals from D5. Continue to assess   NUTRITION DIAGNOSIS:   Inadequate oral intake related to acute illness, altered GI function as evidenced by NPO status. Being addressed via TF  GOAL:   Patient will meet greater than or equal to 90% of their needs  MONITOR:    (Energy Intake, Anthropometrics, Digestive System, Electrolyte/Renal Profile, Glucose Profile)  REASON FOR ASSESSMENT:   LOS    ASSESSMENT:    Pt remains on vent, off diprivan, on precedex; repeat CT abdomen pending for AM due to fevers (per surgery continue TF), mentation remains poor  Diet Order:   NPO  EN: Vital High Protein at rate of 20 ml/hr  Digestive System: no signs of TF intolerance  Skin:  Reviewed, no issues  Last BM:  12/19 large loose post lactulose   Meds: lactulose, ss novolog, precedex, fentanyl, D5 at 75 ml/hr (306 kcals in 24 hours)  Electrolyte and Renal Profile:  Recent Labs Lab 01/05/15 0521 01/06/15 0430 01/07/15 0423  BUN 36* 27* 20  CREATININE 0.80 0.66 0.61  NA 145 147* 141  K 3.3* 3.5 4.0   Glucose Profile:  Recent Labs  01/07/15 0353 01/07/15 0731 01/07/15 1149  GLUCAP 111* 113* 165*   Gastrointestinal Profile: ammonia 44  Height:   Ht Readings from Last 1 Encounters:  12/30/14 5\' 3"  (1.6 m)    Weight:   Wt Readings from Last 1 Encounters:  01/07/15 163 lb 9.3 oz (74.2 kg)   BMI:  Body mass index is 28.98 kg/(m^2).  Estimated Nutritional Needs:   Kcal:  TEE 1645 kcals/d (Ve 10.3, T max37.3)   Protein:  (1.2-2.0 g/kg) 91-152 g/d  Fluid:  (25-230ml/kg) 1900-222480ml/d  EDUCATION NEEDS:   No education needs identified at this time  HIGH Care Level  Brandy Starcherate Srihari Shellhammer MS, RD, LDN [phone] Pager  [phone] Weekend/On-Call Pager

## 2015-01-07 NOTE — Progress Notes (Signed)
Pt had first loose BM at 1600, and was given a bath. Once bath was complete, pt tube feed was resumed. Pt vomited after bath at 1630. Tube feed was stopped. Pt then had a second large, loose BM at 1700. Pt vomited for the second time at 1715. Another Large loose BM at 1730, followed by more emesis. Pt has been cleaned up multiple times. Tube feed is still on hold until further orders. Zofran was administered at 1716. A flexiseal was inserted and a stool sample will be collected to test for C. Diff.

## 2015-01-07 NOTE — Progress Notes (Signed)
Patient ID: Brandy Robinson, female   DOB: 05-31-1965, 49 y.o.   MRN: [id number]   CT scan which was ordered for tomorrow was actually done today because oral contrast was given to the patient today.   I reviewed the CT scan personally and agree with interpretation. There is no clear fluid collections in the abdomen. Drains can be removed tomorrow. The patient is having diarrhea. She is not tolerating oral feeds at this time and they've been held. A C. difficile will be obtained. Started on antifungals by infectious disease consultation.  At this point her respiratory failure seems to be secondary to either a pneumonia versus acute blood continue ventilatory support.   Appreciate critical care infectious disease and internal medicine consultations.

## 2015-01-07 NOTE — Progress Notes (Signed)
Surgery  POD 8  She remains on the ventilator. She had a fever 101 last night around midnight. She is tolerating tube feeds started Monday.  Filed Vitals:   01/07/15 0500 01/07/15 0600 01/07/15 0700 01/07/15 0800  BP: 104/54 104/50 109/54 115/58  Pulse: 92  90 95  Temp: 99.7 F (37.6 C)   100.3 F (37.9 C)  TempSrc: Axillary   Axillary  Resp: 15 15 14 15   Height:      Weight: 163 lb 9.3 oz (74.2 kg)     SpO2: 100%  100% 100%   Temperature max 101.  PE: The patient remains sedated. She is on the ventilatory circuit. She is on 40% FiO2 with a PEEP of 5. Abdomen is softer wound is clean dry and intact all 3 drains demonstrate straw-colored peritoneal fluid no bile was noted.  The drain with the most output is the right inferior which is in the right paracolic gutter.  Labs  CBC Latest Ref Rng 01/07/2015 01/06/2015 01/05/2015  WBC 3.6 - 11.0 K/uL 13.4(H) 13.5(H) 8.4  Hemoglobin 12.0 - 16.0 g/dL 8.2(L) 7.7(L) 7.4(L)  Hematocrit 35.0 - 47.0 % 26.1(L) 24.4(L) 23.3(L)  Platelets 150 - 440 K/uL 326 344 299   CMP Latest Ref Rng 01/07/2015 01/06/2015 01/05/2015  Glucose 65 - 99 mg/dL 161(W128(H) 960(A137(H) 540(J296(H)  BUN 6 - 20 mg/dL 20 81(X27(H) 91(Y36(H)  Creatinine 0.44 - 1.00 mg/dL 7.820.61 9.560.66 2.130.80  Sodium 135 - 145 mmol/L 141 147(H) 145  Potassium 3.5 - 5.1 mmol/L 4.0 3.5 3.3(L)  Chloride 101 - 111 mmol/L 104 113(H) 109  CO2 22 - 32 mmol/L 32 28 29  Calcium 8.9 - 10.3 mg/dL 7.6(L) 7.7(L) 7.7(L)  Total Protein 6.5 - 8.1 g/dL - 5.9(L) -  Total Bilirubin 0.3 - 1.2 mg/dL - 0.5 -  Alkaline Phos 38 - 126 U/L - 97 -  AST 15 - 41 U/L - 41 -  ALT 14 - 54 U/L - 43 -    I/O last 3 completed shifts: In: 5082.4 [I.V.:3737.8; NG/GT:1094.6; IV Piggyback:250] Out: 4608 [Urine:4100; Drains:508] Total I/O In: 106.3 [I.V.:86.3; NG/GT:20] Out: 260 [Urine:260]    IMP:  She remains critically ill with acute lung injury. She has this point postoperative day #8. She is having fevers. This is either related to  pulmonary or intra-abdominal.  Ammonia level is diminished. It is unclear to me why her mental status is so deranged other than critical illness.  Plan:  Continue intravenous antibiotics tube feeds ventilatory support as per pulmonary and critical care medicine I have ordered a CT scan for the morning. Long discussion with the patient's husband at the bedside all of his questions were answered. If CT scan is unremarkable I'll work on getting DRAINS out.

## 2015-01-07 NOTE — Consult Note (Signed)
Reason for Consult: confusion Referring Physician: [name] is an 49 y.o. female.  HPI: 49 yo RHD F presents to [facility] due to abdominal pain and was noted to have a perforated bowel.  She underwent surgery and has not been waking up since even though her sedating meds were weaned per nursing.  She has not been agitated lately either.  Her psychiatric meds were restarted one day prior to consultation.  Past Medical History  Diagnosis Date  . Wears glasses   . Depression   . Anxiety   . GERD (gastroesophageal reflux disease)   . Vaginal atrophy   . Lichen   . Surgical menopause   . Endometriosis   . Dyspareunia   . Insomnia   . Bipolar disorder [facility])     Past Surgical History  Procedure Laterality Date  . Abdominal hysterectomy  1995    [place]-bso  . Arm wound repair / closure  2013    cellulitis rt arm-i/d  . Knee bursectomy Left 09/24/2013    Procedure: IRRIGATION AND DEBRIDEMENT OF LEFT KNEE ;  Surgeon: Renette Butters, MD;  Location: [facility];  Service: Orthopedics;  Laterality: Left;  . Laparotomy N/A 12/30/2014    Procedure: EXPLORATORY LAPAROTOMY-graham patch of peptic ulcer;  Surgeon: Clayburn Pert, MD;  Location: [facility];  Service: General;  Laterality: N/A;    Family History  Problem Relation Age of Onset  . Diabetes Mother   . Diabetes Sister   . Cancer Neg Hx   . Heart disease Neg Hx     Social History:  reports that she has been smoking Cigarettes.  She has been smoking about 1.00 pack per day. She does not have any smokeless tobacco history on file. She reports that she does not drink alcohol or use illicit drugs.  Allergies:  Allergies  Allergen Reactions  . Hydrocodone Itching  . Aspirin Other (See Comments)    Reaction:  GI upset   . Morphine And Related Itching, Nausea And Vomiting and Other (See Comments)    Reaction:  Chest pain    Medications: personally reviewed by me  Results for orders placed or performed  during the hospital encounter of 12/30/14 (from the past 48 hour(s))  Glucose, capillary     Status: Abnormal   Collection Time: 01/05/15  4:26 PM  Result Value Ref Range   Glucose-Capillary 181 (H) 65 - 99 mg/dL  Glucose, capillary     Status: Abnormal   Collection Time: 01/05/15  8:06 PM  Result Value Ref Range   Glucose-Capillary 150 (H) 65 - 99 mg/dL  Glucose, capillary     Status: Abnormal   Collection Time: 01/06/15 12:13 AM  Result Value Ref Range   Glucose-Capillary 160 (H) 65 - 99 mg/dL  CBC     Status: Abnormal   Collection Time: 01/06/15  4:30 AM  Result Value Ref Range   WBC 13.5 (H) 3.6 - 11.0 K/uL   RBC 2.69 (L) 3.80 - 5.20 MIL/uL   Hemoglobin 7.7 (L) 12.0 - 16.0 g/dL   HCT 24.4 (L) 35.0 - 47.0 %   MCV 90.7 80.0 - 100.0 fL   MCH 28.7 26.0 - 34.0 pg   MCHC 31.7 (L) 32.0 - 36.0 g/dL   RDW 15.2 (H) 11.5 - 14.5 %   Platelets 344 150 - 440 K/uL  Comprehensive metabolic panel     Status: Abnormal   Collection Time: 01/06/15  4:30 AM  Result Value Ref  Range   Sodium 147 (H) 135 - 145 mmol/L   Potassium 3.5 3.5 - 5.1 mmol/L   Chloride 113 (H) 101 - 111 mmol/L   CO2 28 22 - 32 mmol/L   Glucose, Bld 137 (H) 65 - 99 mg/dL   BUN 27 (H) 6 - 20 mg/dL   Creatinine, Ser 0.66 0.44 - 1.00 mg/dL   Calcium 7.7 (L) 8.9 - 10.3 mg/dL   Total Protein 5.9 (L) 6.5 - 8.1 g/dL   Albumin 2.1 (L) 3.5 - 5.0 g/dL   AST 41 15 - 41 U/L   ALT 43 14 - 54 U/L   Alkaline Phosphatase 97 38 - 126 U/L   Total Bilirubin 0.5 0.3 - 1.2 mg/dL   GFR calc non Af Amer >60 >60 mL/min   GFR calc Af Amer >60 >60 mL/min    Comment: (NOTE) The eGFR has been calculated using the CKD EPI equation. This calculation has not been validated in all clinical situations. eGFR's persistently <60 mL/min signify possible Chronic Kidney Disease.    Anion gap 6 5 - 15  Glucose, capillary     Status: Abnormal   Collection Time: 01/06/15  4:30 AM  Result Value Ref Range   Glucose-Capillary 125 (H) 65 - 99 mg/dL   Ammonia     Status: Abnormal   Collection Time: 01/06/15  7:48 AM  Result Value Ref Range   Ammonia 51 (H) 9 - 35 umol/L  Glucose, capillary     Status: Abnormal   Collection Time: 01/06/15  8:14 AM  Result Value Ref Range   Glucose-Capillary 128 (H) 65 - 99 mg/dL  Glucose, capillary     Status: Abnormal   Collection Time: 01/06/15 12:08 PM  Result Value Ref Range   Glucose-Capillary 148 (H) 65 - 99 mg/dL  Glucose, capillary     Status: Abnormal   Collection Time: 01/06/15  4:11 PM  Result Value Ref Range   Glucose-Capillary 157 (H) 65 - 99 mg/dL  Glucose, capillary     Status: Abnormal   Collection Time: 01/06/15  7:35 PM  Result Value Ref Range   Glucose-Capillary 127 (H) 65 - 99 mg/dL   Comment 1 Notify RN   Glucose, capillary     Status: Abnormal   Collection Time: 01/07/15 12:19 AM  Result Value Ref Range   Glucose-Capillary 138 (H) 65 - 99 mg/dL   Comment 1 Notify RN   Glucose, capillary     Status: Abnormal   Collection Time: 01/07/15  3:53 AM  Result Value Ref Range   Glucose-Capillary 111 (H) 65 - 99 mg/dL   Comment 1 Notify RN   CBC     Status: Abnormal   Collection Time: 01/07/15  4:23 AM  Result Value Ref Range   WBC 13.4 (H) 3.6 - 11.0 K/uL   RBC 2.87 (L) 3.80 - 5.20 MIL/uL   Hemoglobin 8.2 (L) 12.0 - 16.0 g/dL   HCT 26.1 (L) 35.0 - 47.0 %   MCV 91.0 80.0 - 100.0 fL   MCH 28.6 26.0 - 34.0 pg   MCHC 31.4 (L) 32.0 - 36.0 g/dL   RDW 15.4 (H) 11.5 - 14.5 %   Platelets 326 150 - 440 K/uL  Basic metabolic panel     Status: Abnormal   Collection Time: 01/07/15  4:23 AM  Result Value Ref Range   Sodium 141 135 - 145 mmol/L   Potassium 4.0 3.5 - 5.1 mmol/L   Chloride 104 101 - 111 mmol/L  CO2 32 22 - 32 mmol/L   Glucose, Bld 128 (H) 65 - 99 mg/dL   BUN 20 6 - 20 mg/dL   Creatinine, Ser 0.61 0.44 - 1.00 mg/dL   Calcium 7.6 (L) 8.9 - 10.3 mg/dL   GFR calc non Af Amer >60 >60 mL/min   GFR calc Af Amer >60 >60 mL/min    Comment: (NOTE) The eGFR has been  calculated using the CKD EPI equation. This calculation has not been validated in all clinical situations. eGFR's persistently <60 mL/min signify possible Chronic Kidney Disease.    Anion gap 5 5 - 15  Ammonia     Status: Abnormal   Collection Time: 01/07/15  4:23 AM  Result Value Ref Range   Ammonia 44 (H) 9 - 35 umol/L  Glucose, capillary     Status: Abnormal   Collection Time: 01/07/15  7:31 AM  Result Value Ref Range   Glucose-Capillary 113 (H) 65 - 99 mg/dL  Glucose, capillary     Status: Abnormal   Collection Time: 01/07/15 11:49 AM  Result Value Ref Range   Glucose-Capillary 165 (H) 65 - 99 mg/dL    Dg Chest Port 1 View  01/07/2015  CLINICAL DATA:  Acute respiratory failure, perforated viscus EXAM: PORTABLE CHEST 1 VIEW COMPARISON:  Portable chest x-ray of January 06, 2015 FINDINGS: The lung volumes remain low. The interstitial markings remain increased with areas of confluence. The retrocardiac region on the left is slightly less dense today in the left hemidiaphragm is now partially visible. A small left pleural effusion remains The heart is normal in size. The pulmonary vascularity is not clearly engorged. The endotracheal tube tip lies 2.8 cm above the carina. The esophagogastric tube tip projects below the inferior margin of the image. IMPRESSION: Persistent hypoinflation. Slight interval improvement in pulmonary interstitial edema and left lower lobe atelectasis or pneumonia. There is no pneumothorax or large pleural effusion. The support tubes are in reasonable position. Electronically Signed   By: David  Martinique M.D.   On: 01/07/2015 07:26   Dg Chest Port 1 View  01/06/2015  CLINICAL DATA:  Respiratory failure, perforated viscus. EXAM: PORTABLE CHEST 1 VIEW COMPARISON:  Portable chest x-ray of January 04, 2015 FINDINGS: The lung volumes are low. The pulmonary interstitial markings are increased bilaterally. The pulmonary vascularity is engorged and indistinct. The cardiac  silhouette is top-normal in size. There is small left pleural effusion. The endotracheal tube tip lies 3 cm above the carina. The esophagogastric tube tip projects below the inferior margin of the image. IMPRESSION: Allowing for a bilateral hypoinflation there has not been significant interval change in the appearance of the chest. Persistent fluffy alveolar pneumonia or edema remain. There is a persistent small left pleural effusion. The support tubes are in reasonable position. Electronically Signed   By: David  Martinique M.D.   On: 01/06/2015 07:37    Review of Systems  Unable to perform ROS: critical illness   Blood pressure 114/57, pulse 91, temperature 99.6 F (37.6 C), temperature source Oral, resp. rate 15, height '5\' 3"'  (1.6 m), weight 74.2 kg (163 lb 9.3 oz), SpO2 100 %. Physical Exam  Constitutional: She appears well-nourished. She appears distressed.  HENT:  Head: Normocephalic and atraumatic.  Right Ear: External ear normal.  Left Ear: External ear normal.  Nose: Nose normal.  Mouth/Throat: Oropharynx is clear and moist.  Eyes: Conjunctivae and EOM are normal. Pupils are equal, round, and reactive to light. No scleral icterus.  Neck: Normal range  of motion. Neck supple.  Cardiovascular: Regular rhythm and intact distal pulses.  Exam reveals no friction rub.   No murmur heard. Respiratory: Breath sounds normal. She is in respiratory distress. She has no wheezes.  GI: She exhibits distension. There is tenderness.  Musculoskeletal: Normal range of motion. She exhibits no edema.  Neurological:  Intubated, sedated, opens to pain but does not track or follow, GCS 7T Pupils 22m B and sluggish, + corneals B, good cough Localizes equally, slighly increased tone in legs only 2+/4 B, possible Babinski on L Grimaces to pain in all 4 ext  Skin: She is diaphoretic.    CT of head personally reviewed by me and shows mild white matter changes  Assessment/Plan: 1.  Encephalopathy-   Multifactorial to include possible post-ictal, nutritional, toxic/metabolic and infectious.  These are expected to be temporary and should gradually resolve -  EEG -  Check ammonia -  Will decrease bupriopon and seroquel doses -  Wean IV sedation -  Treat fevers and infection -  Will follow  Brandy Robinson 01/07/2015, 2:38 PM

## 2015-01-07 NOTE — Progress Notes (Signed)
ANTIBIOTIC CONSULT NOTE -follow up  Pharmacy Consult for Zosyn dosing Indication: intra-abdominal infection  Allergies  Allergen Reactions  . Hydrocodone Itching  . Aspirin Other (See Comments)    Reaction:  GI upset   . Morphine And Related Itching, Nausea And Vomiting and Other (See Comments)    Reaction:  Chest pain    Patient Measurements: Height: 5\' 3"  (160 cm) Weight: 163 lb 9.3 oz (74.2 kg) IBW/kg (Calculated) : 52.4 Adjusted Body Weight: n/a  Vital Signs: Temp: 99.6 F (37.6 C) (12/21 1200) Temp Source: Oral (12/21 1200) BP: 114/57 mmHg (12/21 1100) Pulse Rate: 91 (12/21 1100) Intake/Output from previous day: 12/20 0701 - 12/21 0700 In: 3291 [I.V.:2372.4; NG/GT:718.6; IV Piggyback:200] Out: 3925 [Urine:3550; Drains:375] Intake/Output from this shift: Total I/O In: 743.4 [I.V.:393.4; NG/GT:300; IV Piggyback:50] Out: 260 [Urine:260]  Labs:  Recent Labs  01/05/15 0521 01/06/15 0430 01/07/15 0423  WBC 8.4 13.5* 13.4*  HGB 7.4* 7.7* 8.2*  PLT 299 344 326  CREATININE 0.80 0.66 0.61   Estimated Creatinine Clearance: 83.9 mL/min (by C-G formula based on Cr of 0.61).   Microbiology: Recent Results (from the past 720 hour(s))  Blood Culture (routine x 2)     Status: None   Collection Time: 12/30/14  5:37 PM  Result Value Ref Range Status   Specimen Description BLOOD RIGHT ASSIST CONTROL  Final   Special Requests   Final    BOTTLES DRAWN AEROBIC AND ANAEROBIC  3CC AERO, 2 ANAERO   Culture NO GROWTH 5 DAYS  Final   Report Status 01/04/2015 FINAL  Final  Blood Culture (routine x 2)     Status: None   Collection Time: 12/30/14  5:40 PM  Result Value Ref Range Status   Specimen Description BLOOD LEFT HAND  Final   Special Requests   Final    BOTTLES DRAWN AEROBIC AND ANAEROBIC  3CC AERO, 2CC ANAERO   Culture NO GROWTH 5 DAYS  Final   Report Status 01/04/2015 FINAL  Final  Urine culture     Status: None   Collection Time: 12/30/14  7:45 PM  Result Value  Ref Range Status   Specimen Description URINE, RANDOM  Final   Special Requests NONE  Final   Culture NO GROWTH  Final   Report Status 01/01/2015 FINAL  Final  MRSA PCR Screening     Status: None   Collection Time: 12/31/14 12:24 PM  Result Value Ref Range Status   MRSA by PCR NEGATIVE NEGATIVE Final    Comment:        The GeneXpert MRSA Assay (FDA approved for NASAL specimens only), is one component of a comprehensive MRSA colonization surveillance program. It is not intended to diagnose MRSA infection nor to guide or monitor treatment for MRSA infections.   Culture, respiratory (NON-Expectorated)     Status: None   Collection Time: 01/04/15  6:22 PM  Result Value Ref Range Status   Specimen Description TRACHEAL ASPIRATE  Final   Special Requests Normal  Final   Gram Stain   Final    FEW WBC SEEN NO ORGANISMS SEEN FAIR SPECIMEN - 70-80% WBCS    Culture NO GROWTH 2 DAYS  Final   Report Status 01/07/2015 FINAL  Final    Medical History: Past Medical History  Diagnosis Date  . Wears glasses   . Depression   . Anxiety   . GERD (gastroesophageal reflux disease)   . Vaginal atrophy   . Lichen   . Surgical menopause   .  Endometriosis   . Dyspareunia   . Insomnia   . Bipolar disorder [facility])      Assessment: 49 y/o F s/p exploratory laparotomy for perforated gastric ulcer.   12/13 Bcx negative x 2 12/13 Ucx negative MRSA PCR Negative 12/18 TA negative  Plan:  Will continue Zosyn 3.375 g EI q 8 hours.   Luisa Hart, PharmD  Clinical Pharmacist 01/07/2015 1:26 PM

## 2015-01-08 ENCOUNTER — Inpatient Hospital Stay: Payer: Self-pay

## 2015-01-08 DIAGNOSIS — J189 Pneumonia, unspecified organism: Secondary | ICD-10-CM

## 2015-01-08 LAB — GLUCOSE, CAPILLARY
GLUCOSE-CAPILLARY: 104 mg/dL — AB (ref 65–99)
Glucose-Capillary: 120 mg/dL — ABNORMAL HIGH (ref 65–99)
Glucose-Capillary: 135 mg/dL — ABNORMAL HIGH (ref 65–99)
Glucose-Capillary: 139 mg/dL — ABNORMAL HIGH (ref 65–99)
Glucose-Capillary: 125 mg/dL — ABNORMAL HIGH (ref 65–99)

## 2015-01-08 MED ORDER — VITAL HIGH PROTEIN PO LIQD
1000.0000 mL | ORAL | Status: DC
  Administered 2015-01-08: 1000 mL

## 2015-01-08 NOTE — Progress Notes (Signed)
NEUROLOGY NOTE  S: Pt more awake today per nursing, questionable following  ROS unobtainable secondary to intubation  O: 98.2    101/54    96    20 Nl weight, mild distress Normocephalic, oropharynx clear Supple, no JVD CTA B, no wheezing RRR, no murmurs No C/C/E  Intubated, lightly sedated, opens and tracks PERRLA, +corneals, good cough Localizes B equally, nl tone  EEG shows mild slowing, no seizures  A/P: 1. Encephalopathy-  Improving but can fluctuate;  Expect this to resolve with treatment of medical problems -  Continue decreased doses of Seroquel and Wellbutrin -  Wean IV sedation -  Continue to treat infections -  Will sign off, please call with questions -  No neurology f/u needed at this time

## 2015-01-08 NOTE — Progress Notes (Signed)
Nutrition Follow-up     INTERVENTION:   EN: per Philippa ChesterBrittney RN, MD Bird agreeable to restarting TF. Also noted in MD Bird's note. Recommend restarting at rate of 20 ml/hr, goal is 50 ml/hr. Prostat supplementation continues as well   NUTRITION DIAGNOSIS:   Inadequate oral intake related to acute illness, altered GI function as evidenced by NPO status.  GOAL:   Patient will meet greater than or equal to 90% of their needs  MONITOR:    (Energy Intake, Anthropometrics, Digestive System, Electrolyte/Renal Profile, Glucose Profile)  REASON FOR ASSESSMENT:   LOS    ASSESSMENT:    Pt remains on vent, more responsive today, able to follow some commands  Diet Order:   NPO  EN: TF on hold since yesterday due to vomiting  Digestive System: +liquid stool, C.diff negative, pt is on lactulose. Pt emesis x 3 yesterday, bilious appearance. First episode occurred about 30 minutes after pt received bath and TF resumed. Other episodes happened while TF off. NG clamped. CT abdomen with mild colonic ileus, no abscess, rectal tube was inserted yesterday for stool but also assists with decompression  Skin:  Reviewed, no issues  Electrolyte and Renal Profile:  Recent Labs Lab 01/05/15 0521 01/06/15 0430 01/07/15 0423  BUN 36* 27* 20  CREATININE 0.80 0.66 0.61  NA 145 147* 141  K 3.3* 3.5 4.0   Glucose Profile:  Recent Labs  01/08/15 0432 01/08/15 0739 01/08/15 1215  GLUCAP 120* 135* 139*   Meds: D5 at 77 (306 kcals), precedex, ss novolog, fentanyl prn, latulose  Height:   Ht Readings from Last 1 Encounters:  12/30/14 5\' 3"  (1.6 m)    Weight:   Wt Readings from Last 1 Encounters:  01/08/15 162 lb 11.2 oz (73.8 kg)    BMI:  Body mass index is 28.83 kg/(m^2).  Estimated Nutritional Needs:   Kcal:  TEE 1645 kcals/d (Ve 10.3, T max37.3)   Protein:  (1.2-2.0 g/kg) 91-152 g/d  Fluid:  (25-6830ml/kg) 1900-226380ml/d  EDUCATION NEEDS:   No education needs identified at  this time  HIGH Care Level  Romelle Starcherate Jennesis Ramaswamy MS, RD, LDN [phone] Pager  [phone] Weekend/On-Call Pager

## 2015-01-08 NOTE — Progress Notes (Signed)
ANTIBIOTIC CONSULT NOTE -follow up  Pharmacy Consult for Zosyn dosing Indication: intra-abdominal infection  Allergies  Allergen Reactions  . Hydrocodone Itching  . Aspirin Other (See Comments)    Reaction:  GI upset   . Morphine And Related Itching, Nausea And Vomiting and Other (See Comments)    Reaction:  Chest pain    Patient Measurements: Height: 5\' 3"  (160 cm) Weight: 162 lb 11.2 oz (73.8 kg) IBW/kg (Calculated) : 52.4 Adjusted Body Weight: n/a  Vital Signs: Temp: 98.2 F (36.8 C) (12/22 1200) Temp Source: Oral (12/22 1200) BP: 101/54 mmHg (12/22 1200) Pulse Rate: 96 (12/22 1200) Intake/Output from previous day: 12/21 0701 - 12/22 0700 In: 6013.1 [I.V.:1856.4; NG/GT:2696.7; IV Piggyback:410] Out: 3700 [Urine:3625; Drains:75] Intake/Output from this shift: Total I/O In: 561.8 [I.V.:394.8; Other:17; NG/GT:100; IV Piggyback:50] Out: 775 [Urine:775]  Labs:  Recent Labs  01/06/15 0430 01/07/15 0423  WBC 13.5* 13.4*  HGB 7.7* 8.2*  PLT 344 326  CREATININE 0.66 0.61   Estimated Creatinine Clearance: 83.7 mL/min (by C-G formula based on Cr of 0.61).   Microbiology: Recent Results (from the past 720 hour(s))  Blood Culture (routine x 2)     Status: None   Collection Time: 12/30/14  5:37 PM  Result Value Ref Range Status   Specimen Description BLOOD RIGHT ASSIST CONTROL  Final   Special Requests   Final    BOTTLES DRAWN AEROBIC AND ANAEROBIC  3CC AERO, 2 ANAERO   Culture NO GROWTH 5 DAYS  Final   Report Status 01/04/2015 FINAL  Final  Blood Culture (routine x 2)     Status: None   Collection Time: 12/30/14  5:40 PM  Result Value Ref Range Status   Specimen Description BLOOD LEFT HAND  Final   Special Requests   Final    BOTTLES DRAWN AEROBIC AND ANAEROBIC  3CC AERO, 2CC ANAERO   Culture NO GROWTH 5 DAYS  Final   Report Status 01/04/2015 FINAL  Final  Urine culture     Status: None   Collection Time: 12/30/14  7:45 PM  Result Value Ref Range Status   Specimen Description URINE, RANDOM  Final   Special Requests NONE  Final   Culture NO GROWTH  Final   Report Status 01/01/2015 FINAL  Final  MRSA PCR Screening     Status: None   Collection Time: 12/31/14 12:24 PM  Result Value Ref Range Status   MRSA by PCR NEGATIVE NEGATIVE Final    Comment:        The GeneXpert MRSA Assay (FDA approved for NASAL specimens only), is one component of a comprehensive MRSA colonization surveillance program. It is not intended to diagnose MRSA infection nor to guide or monitor treatment for MRSA infections.   Culture, respiratory (NON-Expectorated)     Status: None   Collection Time: 01/04/15  6:22 PM  Result Value Ref Range Status   Specimen Description TRACHEAL ASPIRATE  Final   Special Requests Normal  Final   Gram Stain   Final    FEW WBC SEEN NO ORGANISMS SEEN FAIR SPECIMEN - 70-80% WBCS    Culture NO GROWTH 2 DAYS  Final   Report Status 01/07/2015 FINAL  Final  Urine culture     Status: None (Preliminary result)   Collection Time: 01/07/15  3:29 PM  Result Value Ref Range Status   Specimen Description URINE, CATHETERIZED  Final   Special Requests zosyn Normal  Final   Culture NO GROWTH < 24 HOURS  Final  Report Status PENDING  Incomplete  C difficile quick scan w PCR reflex     Status: None   Collection Time: 01/07/15  6:14 PM  Result Value Ref Range Status   C Diff antigen NEGATIVE NEGATIVE Final   C Diff toxin NEGATIVE NEGATIVE Final   C Diff interpretation Negative for C. difficile  Final    Medical History: Past Medical History  Diagnosis Date  . Wears glasses   . Depression   . Anxiety   . GERD (gastroesophageal reflux disease)   . Vaginal atrophy   . Lichen   . Surgical menopause   . Endometriosis   . Dyspareunia   . Insomnia   . Bipolar disorder [facility])      Assessment: 49 y/o F s/p exploratory laparotomy for perforated gastric ulcer.   12/13 Bcx negative x 2 12/13 Ucx negative MRSA PCR Negative 12/18 TA  negative  Plan:  Will continue Zosyn 3.375 g EI q 8 hours.   Luisa Hart, PharmD  Clinical Pharmacist 01/08/2015 12:22 PM

## 2015-01-08 NOTE — Progress Notes (Signed)
ID E note Fever resolved. Had emesis so TF stopped. Had diarrhea but C diff negative CT showed atelectatic lung, ground glass in lungs but no abscess. BCX, UCX and trach asp ngtd  Rec Cont zosyn and eraxis

## 2015-01-08 NOTE — Progress Notes (Addendum)
PULMONARY / CRITICAL CARE MEDICINE   Name: Brandy Robinson MRN: [id number] DOB: Apr 13, 1965    ADMISSION DATE:  12/30/2014  BRIEF HISTORY: 49 year old female past medical history of depression, anxiety, gastric reflux, insomnia, bipolar, takes ibuprofen and powders for pain at home. Admitted with severe abdominal pain, found to have free air in the peritoneum, taken today or for perforated gastric ulcer surgery with worsening confusion and hypotension, transferred to ICU with elevated heart rate, status post open laparotomy for perforated viscus..  SUBJECTIVE:  No acute overnight events, started on antifungal by ID, following simple commands.  POD 9  VITAL SIGNS: Temp:  [98.3 F (36.8 C)-100.3 F (37.9 C)] 98.3 F (36.8 C) (12/22 0400) Pulse Rate:  [87-110] 92 (12/22 0600) Resp:  [13-22] 16 (12/22 0600) BP: (100-131)/(52-71) 104/59 mmHg (12/22 0600) SpO2:  [98 %-100 %] 100 % (12/22 0600) FiO2 (%):  [35 %-40 %] 35 % (12/22 0351) HEMODYNAMICS:   VENTILATOR SETTINGS: Vent Mode:  [-] PRVC FiO2 (%):  [35 %-40 %] 35 % Set Rate:  [16 bmp] 16 bmp Vt Set:  [420 mL] 420 mL PEEP:  [5 cmH20] 5 cmH20 Pressure Support:  [8 cmH20-10 cmH20] 8 cmH20 INTAKE / OUTPUT:  Intake/Output Summary (Last 24 hours) at 01/08/15 [id number] Last data filed at 01/08/15 0600  Gross per 24 hour  Intake 6019.35 ml  Output   3700 ml  Net 2319.35 ml    Review of Systems  Unable to perform ROS: critical illness    Physical Exam  Constitutional: She is well-developed, well-nourished, and in no distress.  HENT:  Head: Normocephalic and atraumatic.  Eyes: EOM are normal. Right eye exhibits no discharge. Left eye exhibits no discharge.  Neck: No JVD present. No thyromegaly present.  Cardiovascular: Normal rate, regular rhythm, normal heart sounds and intact distal pulses.  Exam reveals no friction rub.   No murmur heard. Pulmonary/Chest: Breath sounds normal. She has no wheezes. She has no rales.  Abdominal:  She exhibits distension. There is no rebound and no guarding.  Incision intact, no purulent drainage, bandages in place  Musculoskeletal: She exhibits no edema.  Lymphadenopathy:    She has no cervical adenopathy.  Neurological: She has normal reflexes. No cranial nerve deficit.  On vent, Following simple commands - wiggle toes.   Skin: Skin is warm and dry.     LABS:  CBC  Recent Labs Lab 01/05/15 0521 01/06/15 0430 01/07/15 0423  WBC 8.4 13.5* 13.4*  HGB 7.4* 7.7* 8.2*  HCT 23.3* 24.4* 26.1*  PLT 299 344 326   Coag's  Recent Labs Lab 01/02/15 0501  INR 1.19   BMET  Recent Labs Lab 01/05/15 0521 01/06/15 0430 01/07/15 0423  NA 145 147* 141  K 3.3* 3.5 4.0  CL 109 113* 104  CO2 29 28 32  BUN 36* 27* 20  CREATININE 0.80 0.66 0.61  GLUCOSE 296* 137* 128*   Electrolytes  Recent Labs Lab 01/05/15 0521 01/06/15 0430 01/07/15 0423  CALCIUM 7.7* 7.7* 7.6*   Sepsis Markers No results for input(s): LATICACIDVEN, PROCALCITON, O2SATVEN in the last 168 hours. ABG  Recent Labs Lab 01/04/15 1100 01/04/15 1610 01/04/15 2125  PHART 7.47* 7.46* 7.51*  PCO2ART 43 44 42  PO2ART 66* 66* 131*   Liver Enzymes  Recent Labs Lab 01/06/15 0430  AST 41  ALT 43  ALKPHOS 97  BILITOT 0.5  ALBUMIN 2.1*   Cardiac Enzymes No results for input(s): TROPONINI, PROBNP in the last 168 hours. Glucose  Recent Labs Lab 01/07/15 0731 01/07/15 1149 01/07/15 1615 01/07/15 1949 01/07/15 2334 01/08/15 0432  GLUCAP 113* 165* 107* 149* 122* 120*    Imaging Ct Abdomen Pelvis W Contrast  01/07/2015  CLINICAL DATA:  49 year old female inpatient admitted with perforated gastric ulcer status post emergent surgical repair with Cheree Ditto patch, now with respiratory failure requiring intubation 3 days prior with new fever and tachycardia. EXAM: CT ABDOMEN AND PELVIS WITH CONTRAST TECHNIQUE: Multidetector CT imaging of the abdomen and pelvis was performed using the standard  protocol following bolus administration of intravenous contrast. CONTRAST:  OMNIPAQUE IOHEXOL 300 MG/ML  SOLN COMPARISON:  12/30/2014 CT abdomen/pelvis. 01/05/2015 abdominal radiograph. FINDINGS: Study is mildly motion degraded. Lower chest: Small to moderate left and small right layering pleural effusions, new. Associated passive atelectasis in the dependent left greater than right lower lobes. There is patchy consolidation and ground-glass opacity in the non atelectatic portions of the right middle lobe, lingula and bilateral lower lobes. Hepatobiliary: Normal liver with no liver mass. Normal gallbladder with no radiopaque cholelithiasis. No biliary ductal dilatation. Pancreas: Normal, with no mass or duct dilation. Spleen: Normal size. No mass. Adrenals/Urinary Tract: Normal adrenals. Normal kidneys with no hydronephrosis and no renal mass. Urinary bladder is collapsed by indwelling Foley catheter. Stomach/Bowel: Enteric tube terminates in the body of the stomach. Grossly normal stomach. Normal caliber small bowel with no small bowel wall thickening. Normal appendix . Oral contrast progresses to the rectum. No large bowel wall thickening. Mild diffuse dilatation of the colon, likely representing a mild adynamic ileus. Vascular/Lymphatic: Atherosclerotic nonaneurysmal abdominal aorta. Patent portal, splenic, hepatic and renal veins. No pathologically enlarged lymph nodes in the abdomen or pelvis. Reproductive: Status post hysterectomy, with no abnormal findings at the vaginal cuff. No adnexal mass. Other: No residual of pneumoperitoneum. Trace simple free fluid in the pelvic cul-de-sac. No abdominal ascites. No focal fluid collection in the abdomen or pelvis. There are three percutaneous surgical drains in the abdomen, one of which terminates in the left subhepatic space, one of which terminates in the right paracolic region and the last of which terminates in the left anterior peritoneal cavity.  Musculoskeletal: No aggressive appearing focal osseous lesions. Skin staples are noted at the vertical laparotomy site in the midline ventral abdomen, with no superficial fluid collections. Mild anasarca. IMPRESSION: 1. Patchy consolidation and ground-glass opacity in the non atelectatic portions of both lung bases, suspicious for multifocal pneumonia and/or aspiration. 2. Small moderate left and small right layering pleural effusions. Trace free fluid in the pelvic cul-de-sac. Mild anasarca. 3. Three surgical drains in the abdomen, with no focal fluid collections in the abdomen or pelvis. No residual pneumoperitoneum. 4. Mild adynamic ileus of the colon. Electronically Signed   By: Delbert Phenix M.D.   On: 01/07/2015 15:31    LINES/TUBES: ETT 12/18 >>   MICRO: MRSA PCR 12/14 >> NEG Urine 12/13 >> NEG Blood 12/13 >> NEG resp 12/18 >> NEG Blood 12/21>>  ANTIBIOTICS Zosyn 12/14 >>  Eraxis 12/21 (Antifungal)>>  ASSESSMENT / PLAN: Post laparotomy 12/13 for perforated gastric ulcer Acute resp failure - intubated for extreme agitation and concomitant respiratory distress Bilateral pulmonary infiltrates on CXR - edema vs PNA vs ALI/ARDS  Note normal LVEF on Echocardiogram 12/15 Hypernatremia Hypokalemia Elevated ammonia level of unclear significance Severe sepsis, peritonitis Anemia without overt bleeding presently Bipolar disorder with multiple chronic psych meds Severe intermittent agitated delirium  Cont vent support - settings reviewed and/or adjusted Wean on PSV mode as tolerated  Cont vent bundle Daily WUA/SBT Monitor BMET intermittently Monitor I/Os Correct electrolytes as indicated IVFs adjusted 12/20 Lasix X 1 12/20 SUP: IV pantoprazole NGT placed 12/19 Cont TFs DVT px: SCDs Monitor CBC intermittently Transfuse per usual guidelines Monitor temp, WBC count Micro and abx as above resume quetiapine, buspirone, bupropion Continue precedex- RASS goal 0, -1 Cont PRN  lorazepam, fentanyl as ordered Redraw blood cultures - spiking fevers on Zosyn Repeat CT A\P with no acute findings EEG pending.  Use precedex only for agitation as patient has restarted home antipsychotics - fentanyl/propofol gtt has been causing more neurologic depression and AMS making it more difficult to wean from vent.   CCM time: 40 mins The above time includes time spent in consultation with patient and/or family members and reviewing care plan on multidisciplinary rounds  Stephanie Acre, MD [facility] Pager [phone] (please enter 7-digits) On Call Pager - [phone] (please enter 7-digits)

## 2015-01-08 NOTE — Progress Notes (Signed)
Patients son Brandy AverDustin Robinson wants to talk with case management regarding patients medicaid application. She was denied prior to this admission. His number is [phone]. [name] RN spoke with [name] and she stated to put a note in and that she would call him as soon as she is available. Will continue to assess and monitor.

## 2015-01-08 NOTE — Progress Notes (Signed)
Surgery  POD 9  S/P expiratory laparotomy with Cheree DittoGraham patch closure of perforated gastric ulcer. Over the course of the last 24 hours a CT scan was obtained which I personally reviewed demonstrating no evidence of intra-abdominal fluid collection.  Tube feeds have been stopped due to emesis yesterday. She is having diarrhea. C. difficile analysis is negative. On lactulose.  daily ammonia levels maintained stable.  Mental status remains unchanged. A neurology consult has been ordered. Head CT is negative.  Filed Vitals:   01/08/15 0600 01/08/15 0700 01/08/15 0740 01/08/15 0800  BP: 104/59 109/56  110/58  Pulse: 92 92  89  Temp:    98.6 F (37 C)  TempSrc:    Oral  Resp: 16 15  16   Height:      Weight:   162 lb 11.2 oz (73.8 kg)   SpO2: 100% 100%  99%    PE: No bile seen within any of the drains. Output is markedly diminished. Drains were removed. Wound is clean dry and intact.  Labs  CBC Latest Ref Rng 01/07/2015 01/06/2015 01/05/2015  WBC 3.6 - 11.0 K/uL 13.4(H) 13.5(H) 8.4  Hemoglobin 12.0 - 16.0 g/dL 8.2(L) 7.7(L) 7.4(L)  Hematocrit 35.0 - 47.0 % 26.1(L) 24.4(L) 23.3(L)  Platelets 150 - 440 K/uL 326 344 299   CMP Latest Ref Rng 01/07/2015 01/06/2015 01/05/2015  Glucose 65 - 99 mg/dL 409(W128(H) 119(J137(H) 478(G296(H)  BUN 6 - 20 mg/dL 20 95(A27(H) 21(H36(H)  Creatinine 0.44 - 1.00 mg/dL 0.860.61 5.780.66 4.690.80  Sodium 135 - 145 mmol/L 141 147(H) 145  Potassium 3.5 - 5.1 mmol/L 4.0 3.5 3.3(L)  Chloride 101 - 111 mmol/L 104 113(H) 109  CO2 22 - 32 mmol/L 32 28 29  Calcium 8.9 - 10.3 mg/dL 7.6(L) 7.7(L) 7.7(L)  Total Protein 6.5 - 8.1 g/dL - 5.9(L) -  Total Bilirubin 0.3 - 1.2 mg/dL - 0.5 -  Alkaline Phos 38 - 126 U/L - 97 -  AST 15 - 41 U/L - 41 -  ALT 14 - 54 U/L - 43 -    I/O last 3 completed shifts: In: 8266.2 [I.V.:3769.5; Other:1050; NG/GT:2936.7; IV Piggyback:510] Out: 4550 [Urine:4325; Drains:225] Total I/O In: 87.5 [I.V.:77.5; Other:10] Out: 500 [Urine:500]    IMP postoperative  respiratory failure pneumonia versus acute lung injury versus ARDS. Diarrhea secondary to lactulose. If her tube feeds are started her intravenous fluids need to be diminished. She is up nearly 5 L from admission.  Plan:  Attempt restarting of tube feeds. Family was updated. Wean ventilator as needed. I will defer to critical care regarding the attention to the ins and outs. Discussed with RN.

## 2015-01-08 NOTE — Care Management (Signed)
Tube feeding will be restarted today.  Neuro consult is pending.

## 2015-01-09 LAB — GLUCOSE, CAPILLARY
GLUCOSE-CAPILLARY: 130 mg/dL — AB (ref 65–99)
GLUCOSE-CAPILLARY: 133 mg/dL — AB (ref 65–99)
GLUCOSE-CAPILLARY: 137 mg/dL — AB (ref 65–99)
GLUCOSE-CAPILLARY: 142 mg/dL — AB (ref 65–99)
Glucose-Capillary: 123 mg/dL — ABNORMAL HIGH (ref 65–99)
Glucose-Capillary: 145 mg/dL — ABNORMAL HIGH (ref 65–99)
Glucose-Capillary: 112 mg/dL — ABNORMAL HIGH (ref 65–99)

## 2015-01-09 MED ORDER — VITAL HIGH PROTEIN PO LIQD
1000.0000 mL | ORAL | Status: DC
  Administered 2015-01-09: 23:00:00
  Administered 2015-01-09: 1000 mL
  Administered 2015-01-10: 04:00:00

## 2015-01-09 MED ORDER — METRONIDAZOLE IN NACL 5-0.79 MG/ML-% IV SOLN
500.0000 mg | Freq: Three times a day (TID) | INTRAVENOUS | Status: DC
  Administered 2015-01-09 – 2015-01-12 (×9): 500 mg via INTRAVENOUS
  Filled 2015-01-09 (×13): qty 100

## 2015-01-09 MED ORDER — FENTANYL CITRATE (PF) 100 MCG/2ML IJ SOLN
25.0000 ug | INTRAMUSCULAR | Status: DC | PRN
  Administered 2015-01-10: 25 ug via INTRAVENOUS
  Filled 2015-01-09: qty 2

## 2015-01-09 MED ORDER — FUROSEMIDE 10 MG/ML IJ SOLN
INTRAMUSCULAR | Status: AC
  Administered 2015-01-09: 40 mg
  Filled 2015-01-09: qty 4

## 2015-01-09 MED ORDER — CIPROFLOXACIN IN D5W 400 MG/200ML IV SOLN
400.0000 mg | Freq: Two times a day (BID) | INTRAVENOUS | Status: DC
  Administered 2015-01-09 – 2015-01-12 (×6): 400 mg via INTRAVENOUS
  Filled 2015-01-09 (×8): qty 200

## 2015-01-09 NOTE — Evaluation (Signed)
Clinical/Bedside Swallow Evaluation Patient Details  Name: Brandy Robinson MRN: [id number] Date of Birth: [birth year]  Today's Date: 01/09/2015 Time: SLP Start Time (ACUTE ONLY): 1245 SLP Stop Time (ACUTE ONLY): 1330 SLP Time Calculation (min) (ACUTE ONLY): 45 min  Past Medical History:  Past Medical History  Diagnosis Date  . Wears glasses   . Depression   . Anxiety   . GERD (gastroesophageal reflux disease)   . Vaginal atrophy   . Lichen   . Surgical menopause   . Endometriosis   . Dyspareunia   . Insomnia   . Bipolar disorder [facility](HCC)    Past Surgical History:  Past Surgical History  Procedure Laterality Date  . Abdominal hysterectomy  1995    [place]  . Arm wound repair / closure  2013    cellulitis rt arm-i/d  . Knee bursectomy Left 09/24/2013    Procedure: IRRIGATION AND DEBRIDEMENT OF LEFT KNEE ;  Surgeon: Sheral Apleyimothy D Murphy, MD;  Location: [facility];  Service: Orthopedics;  Laterality: Left;  . Laparotomy N/A 12/30/2014    Procedure: EXPLORATORY LAPAROTOMY-graham patch of peptic ulcer;  Surgeon: Ricarda Frameharles Woodham, MD;  Location: [facility];  Service: General;  Laterality: N/A;   HPI:  Brandy Robinson is a 49 y.o. female with a known history of depression, anxiety, gastric reflux, insomnia, bipolar disorder- takes ibuprofen and Goody powders at home for her pain. He came to emergency room in night with severe abdominal pain and found having free air in peritoneum, taken to the OR by surgical team for perforated gastric ulcer, and postsurgery admitted in stepdown unit. She remained tachycardiac and could not be given any oral medications so medical consult was called in. Pt was intubated on 12/18 and extubated this morning. Pt currently has NG tube in place.   Assessment / Plan / Recommendation Clinical Impression  Pt is judged to be high risk of aspiration d/t generalized weakness, lethargy, recent extubation and current ng placement. Pt given one tsp bolus of ice  chips and after initiating pharyngeal swallow; demonstrated immediate overt cough followed by wet vocal quality. O2 and RR remained stable. Pt demonstrated only an aphonic vocal quality while speaking to ST, but cough appeared strong and protective.  No further trials given at this time d/t risk of aspiration. Oral motor exam revealed generalized lingual and labial weakness but pt demonstrated adequate manipulation and transfer of ice chip bolus. Recommend continue w/NPO at this time and will re-attempt further trials in 1-2 days. Pt/family and nsg in agreement.     Aspiration Risk  Severe aspiration risk    Diet Recommendation NPO        Other  Recommendations Oral Care Recommendations: Staff/trained caregiver to provide oral care;Oral care QID   Follow up Recommendations   (TBD)    Frequency and Duration min 3x week  3 weeks       Prognosis Prognosis for Safe Diet Advancement: Fair Barriers to Reach Goals: Severity of deficits      Swallow Study   General HPI: Brandy Robinson is a 49 y.o. female with a known history of depression, anxiety, gastric reflux, insomnia, bipolar disorder- takes ibuprofen and Goody powders at home for her pain. He came to emergency room in night with severe abdominal pain and found having free air in peritoneum, taken to the OR by surgical team for perforated gastric ulcer, and postsurgery admitted in stepdown unit. She remained tachycardiac and could not be given any oral medications so medical consult was  called in. Pt was intubated on 12/18 and extubated this morning. Pt currently has NG tube in place. Type of Study: Bedside Swallow Evaluation Previous Swallow Assessment: none Diet Prior to this Study: NPO;NG Tube Temperature Spikes Noted: No Respiratory Status: Nasal cannula History of Recent Intubation: Yes Length of Intubations (days): 6 days Date extubated: 01/09/15 Behavior/Cognition: Cooperative;Lethargic/Drowsy;Requires cueing Oral Cavity  Assessment: Dried secretions Oral Care Completed by SLP: Yes Oral Cavity - Dentition: Adequate natural dentition Vision:  (unable to fully asses) Self-Feeding Abilities: Total assist Patient Positioning: Upright in bed Baseline Vocal Quality: Aphonic Volitional Cough: Strong Volitional Swallow: Able to elicit    Oral/Motor/Sensory Function Overall Oral Motor/Sensory Function: Generalized oral weakness   Ice Chips Ice chips: Impaired Presentation: Spoon Pharyngeal Phase Impairments: Wet Vocal Quality;Cough - Immediate Other Comments: Pt given one tsp of ice chips and after initiating swallow, demonstrated immediate overt cough and then wet vocal quality. No further boluses given.   Thin Liquid Thin Liquid: Not tested    Nectar Thick Nectar Thick Liquid: Not tested   Honey Thick Honey Thick Liquid: Not tested   Puree Puree: Not tested   Solid Solid: Not tested       Stanhope,Sherra Kimmons 01/09/2015,1:49 PM

## 2015-01-09 NOTE — Care Management (Signed)
Patient has been extubated.  She is awake and whispering appropriate responses to to conversation.  Informed her and her son Amalia HaileyDustin that patient financial resource staff are aware that patient does not have a payor source. It is not known if patient's current illness will add to patient disability claim.  She has been declined twice.  Her disability is her bipolar disorder.

## 2015-01-09 NOTE — Progress Notes (Signed)
ANTIBIOTIC CONSULT NOTE -follow up  Pharmacy Consult for Zosyn dosing Indication: intra-abdominal infection  Allergies  Allergen Reactions  . Hydrocodone Itching  . Aspirin Other (See Comments)    Reaction:  GI upset   . Morphine And Related Itching, Nausea And Vomiting and Other (See Comments)    Reaction:  Chest pain    Patient Measurements: Height:  (160 cm) Weight: 162 lb 0.6 oz (73.5 kg) IBW/kg (Calculated) : 52.4 Adjusted Body Weight: n/a  Vital Signs: Temp: 99.3 F (37.4 C) (12/23 0800) Temp Source: Axillary (12/23 0800) BP: 107/52 mmHg (12/23 1000) Pulse Rate: 88 (12/23 1000) Intake/Output from previous day: 12/22 0701 - 12/23 0700 In: 2772.7 [I.V.:1825.7; NG/GT:790; IV Piggyback:150] Out: 4975 [Urine:4000; Stool:975] Intake/Output from this shift:    Labs:  Recent Labs  01/07/15 0423  WBC 13.4*  HGB 8.2*  PLT 326  CREATININE 0.61   Estimated Creatinine Clearance: 83.4 mL/min (by C-G formula based on Cr of 0.61).   Microbiology: Recent Results (from the past 720 hour(s))  Blood Culture (routine x 2)     Status: None   Collection Time: 12/30/14  5:37 PM  Result Value Ref Range Status   Specimen Description BLOOD RIGHT ASSIST CONTROL  Final   Special Requests   Final    BOTTLES DRAWN AEROBIC AND ANAEROBIC  3CC AERO, 2 ANAERO   Culture NO GROWTH 5 DAYS  Final   Report Status 01/04/2015 FINAL  Final  Blood Culture (routine x 2)     Status: None   Collection Time: 12/30/14  5:40 PM  Result Value Ref Range Status   Specimen Description BLOOD LEFT HAND  Final   Special Requests   Final    BOTTLES DRAWN AEROBIC AND ANAEROBIC  3CC AERO, 2CC ANAERO   Culture NO GROWTH 5 DAYS  Final   Report Status 01/04/2015 FINAL  Final  Urine culture     Status: None   Collection Time: 12/30/14  7:45 PM  Result Value Ref Range Status   Specimen Description URINE, RANDOM  Final   Special Requests NONE  Final   Culture NO GROWTH  Final   Report Status  01/01/2015 FINAL  Final  MRSA PCR Screening     Status: None   Collection Time: 12/31/14 12:24 PM  Result Value Ref Range Status   MRSA by PCR NEGATIVE NEGATIVE Final    Comment:        The GeneXpert MRSA Assay (FDA approved for NASAL specimens only), is one component of a comprehensive MRSA colonization surveillance program. It is not intended to diagnose MRSA infection nor to guide or monitor treatment for MRSA infections.   Culture, respiratory (NON-Expectorated)     Status: None   Collection Time: 01/04/15  6:22 PM  Result Value Ref Range Status   Specimen Description TRACHEAL ASPIRATE  Final   Special Requests Normal  Final   Gram Stain   Final    FEW WBC SEEN NO ORGANISMS SEEN FAIR SPECIMEN - 70-80% WBCS    Culture NO GROWTH 2 DAYS  Final   Report Status 01/07/2015 FINAL  Final  Culture, blood (Routine X 2) w Reflex to ID Panel     Status: None (Preliminary result)   Collection Time: 01/07/15  3:07 PM  Result Value Ref Range Status   Specimen Description BLOOD LEFT ARM  Final   Special Requests   Final    BOTTLES DRAWN AEROBIC AND ANAEROBIC 9CCAERO, 7CCANA   Culture NO GROWTH 2 DAYS  Final   Report Status PENDING  Incomplete  Culture, blood (Routine X 2) w Reflex to ID Panel     Status: None (Preliminary result)   Collection Time: 01/07/15  3:16 PM  Result Value Ref Range Status   Specimen Description BLOOD RIGHT WRIST  Final   Special Requests BOTTLES DRAWN AEROBIC AND ANAEROBIC 7CCAERO,6CCANA  Final   Culture NO GROWTH 2 DAYS  Final   Report Status PENDING  Incomplete  Urine culture     Status: None (Preliminary result)   Collection Time: 01/07/15  3:29 PM  Result Value Ref Range Status   Specimen Description URINE, CATHETERIZED  Final   Special Requests zosyn Normal  Final   Culture HOLDING FOR POSSIBLE PATHOGEN  Final   Report Status PENDING  Incomplete  C difficile quick scan w PCR reflex     Status: None   Collection Time: 01/07/15  6:14 PM  Result  Value Ref Range Status   C Diff antigen NEGATIVE NEGATIVE Final   C Diff toxin NEGATIVE NEGATIVE Final   C Diff interpretation Negative for C. difficile  Final    Medical History: Past Medical History  Diagnosis Date  . Wears glasses   . Depression   . Anxiety   . GERD (gastroesophageal reflux disease)   . Vaginal atrophy   . Lichen   . Surgical menopause   . Endometriosis   . Dyspareunia   . Insomnia   . Bipolar disorder [facility](HCC)      Assessment: 49 y/o F s/p exploratory laparotomy for perforated gastric ulcer.   12/13 Bcx negative x 2 12/13 Ucx negative MRSA PCR Negative 12/18 TA negative 12/21 C diff negative 12/21 Urine cx NGTD 12/21 Blood cx NGTD x 2   Plan:  Will continue Zosyn 3.375 g EI q 8 hours.   Luisa HartScott Carollyn Etcheverry, PharmD  Clinical Pharmacist 01/09/2015 12:17 PM

## 2015-01-09 NOTE — Progress Notes (Signed)
Nutrition Follow-up    INTERVENTION:   EN: per Gwenyth OberAdam RN, MD Simonds plans to restart TF via NG later today. If TF restarted and pt remains off vent, recommend changing to Vital 1.5 with goal of 60 ml/hr providing 98 g of protein, 2160 kcals, 1094 mL of free water. Continue to assess   NUTRITION DIAGNOSIS:   Inadequate oral intake related to acute illness, altered GI function as evidenced by NPO status.  GOAL:   Patient will meet greater than or equal to 90% of their needs  MONITOR:    (Energy Intake, Anthropometrics, Digestive System, Electrolyte/Renal Profile, Glucose Profile)  REASON FOR ASSESSMENT:   LOS    ASSESSMENT:    Pt s/p extubation this AM, cognition remains altered. NG remains in place, TF discontinued with extubation  Diet Order:   NPO  EN: TF discontinued with extubation but previously tolerating re-initiation   Digestive System: rectal tube with 975 mL of liquid stool  Skin:  Reviewed, no issues  Electrolyte and Renal Profile:  Recent Labs Lab 01/05/15 0521 01/06/15 0430 01/07/15 0423  BUN 36* 27* 20  CREATININE 0.80 0.66 0.61  NA 145 147* 141  K 3.3* 3.5 4.0   Glucose Profile:   Recent Labs  01/09/15 0018 01/09/15 0405 01/09/15 0729  GLUCAP 142* 123* 133*   Meds: D5 at 100 ml/hr, ss novolog  Height:   Ht Readings from Last 1 Encounters:  12/30/14 5\' 3"  (1.6 m)    Weight:   Wt Readings from Last 1 Encounters:  01/09/15 162 lb 0.6 oz (73.5 kg)    Filed Weights   01/07/15 0500 01/08/15 0740 01/09/15 0450  Weight: 163 lb 9.3 oz (74.2 kg) 162 lb 11.2 oz (73.8 kg) 162 lb 0.6 oz (73.5 kg)    BMI:  Body mass index is 28.71 kg/(m^2).  Estimated Nutritional Needs:   Kcal:  [id number] kcals (BEE 1339, 1.3 AF, 1.1-1.3 IF)   Protein:  89-111 g (1.2-1.5 g/kg)   Fluid:  1850-2220 mL (25-30 ml/kg)   EDUCATION NEEDS:   No education needs identified at this time  HIGH Care Level  Romelle Starcherate Izac Faulkenberry MS, RD, LDN [phone] Pager   [phone] Weekend/On-Call Pager

## 2015-01-09 NOTE — Progress Notes (Signed)
PULMONARY / CRITICAL CARE MEDICINE   Name: Brandy Robinson MRN: [id number] DOB: 1965/02/03    ADMISSION DATE:  12/30/2014  BRIEF HISTORY: 49 year old female past medical history of depression, anxiety, gastric reflux, insomnia, bipolar, takes ibuprofen and powders for pain at home. Admitted with severe abdominal pain, found to have free air in the peritoneum, taken today or for perforated gastric ulcer surgery with worsening confusion and hypotension, transferred to ICU with elevated heart rate, status post open laparotomy for perforated viscus..  SUBJECTIVE:  Passed SBT this AM. Extubated and tolerating. Remains lethargic  VITAL SIGNS: Temp:  [98.9 F (37.2 C)-100.5 F (38.1 C)] 99.3 F (37.4 C) (12/23 0800) Pulse Rate:  [62-108] 97 (12/23 1400) Resp:  [13-26] 22 (12/23 1400) BP: (93-127)/(48-69) 113/53 mmHg (12/23 1400) SpO2:  [82 %-100 %] 99 % (12/23 1400) FiO2 (%):  [35 %] 35 % (12/23 0747) Weight:  [73.5 kg (162 lb 0.6 oz)] 73.5 kg (162 lb 0.6 oz) (12/23 0450) HEMODYNAMICS:   VENTILATOR SETTINGS: Vent Mode:  [-] PRVC FiO2 (%):  [35 %] 35 % Set Rate:  [16 bmp] 16 bmp Vt Set:  [420 mL] 420 mL PEEP:  [5 cmH20] 5 cmH20 Pressure Support:  [5 cmH20] 5 cmH20 INTAKE / OUTPUT:  Intake/Output Summary (Last 24 hours) at 01/09/15 1432 Last data filed at 01/09/15 1200  Gross per 24 hour  Intake 1946.27 ml  Output   5500 ml  Net -3553.73 ml    Review of Systems  Unable to perform ROS: mental acuity    Physical Exam  Constitutional: She is well-developed, well-nourished, and in no distress.  HENT:  Head: Normocephalic and atraumatic.  Eyes: Conjunctivae and EOM are normal. Pupils are equal, round, and reactive to light.  Neck: No JVD present. No thyromegaly present.  Cardiovascular: Normal rate, regular rhythm, normal heart sounds and intact distal pulses.  Exam reveals no friction rub.   No murmur heard. Pulmonary/Chest: Breath sounds normal. She has no wheezes. She has  no rales.  Abdominal: She exhibits distension. There is no rebound and no guarding.  Incision intact, no purulent drainage, bandages in place  Musculoskeletal: She exhibits no edema.  Lymphadenopathy:    She has no cervical adenopathy.  Neurological: She has normal reflexes. No cranial nerve deficit.  Confused, altered mental status  Skin: Skin is warm and dry.     LABS:  CBC  Recent Labs Lab 01/05/15 0521 01/06/15 0430 01/07/15 0423  WBC 8.4 13.5* 13.4*  HGB 7.4* 7.7* 8.2*  HCT 23.3* 24.4* 26.1*  PLT 299 344 326   Coag's No results for input(s): APTT, INR in the last 168 hours. BMET  Recent Labs Lab 01/05/15 0521 01/06/15 0430 01/07/15 0423  NA 145 147* 141  K 3.3* 3.5 4.0  CL 109 113* 104  CO2 29 28 32  BUN 36* 27* 20  CREATININE 0.80 0.66 0.61  GLUCOSE 296* 137* 128*   Electrolytes  Recent Labs Lab 01/05/15 0521 01/06/15 0430 01/07/15 0423  CALCIUM 7.7* 7.7* 7.6*   Sepsis Markers No results for input(s): LATICACIDVEN, PROCALCITON, O2SATVEN in the last 168 hours. ABG  Recent Labs Lab 01/04/15 1100 01/04/15 1610 01/04/15 2125  PHART 7.47* 7.46* 7.51*  PCO2ART 43 44 42  PO2ART 66* 66* 131*   Liver Enzymes  Recent Labs Lab 01/06/15 0430  AST 41  ALT 43  ALKPHOS 97  BILITOT 0.5  ALBUMIN 2.1*   Cardiac Enzymes No results for input(s): TROPONINI, PROBNP in the last 168 hours.  Glucose  Recent Labs Lab 01/08/15 1615 01/08/15 1944 01/09/15 0018 01/09/15 0405 01/09/15 0729 01/09/15 1200  GLUCAP 125* 104* 142* 123* 133* 145*    Imaging No results found.  LINES/TUBES: ETT 12/18 >> 12/23  MICRO: MRSA PCR 12/14 >> NEG Urine 12/13 >> NEG Blood 12/13 >> NEG resp 12/18 >> NEG  ANTIBIOTICS Zosyn 12/14 >>  anidulafungin 12/22 >>   ASSESSMENT / PLAN: Post laparotomy 12/13 for perforated gastric ulcer Acute resp failure - intubated for extreme agitation and concomitant respiratory distress  Appears resolved Bilateral  pulmonary infiltrates on CXR - edema vs PNA vs ALI/ARDS  Note normal LVEF on Echocardiogram 12/15  Improved after furosemide 12/21 Hypernatremia, resolved Hypokalemia, resolved Elevated ammonia level of unclear significance Severe sepsis, peritonitis Anemia without overt bleeding presently Bipolar disorder with multiple chronic psych meds Severe intermittent agitated delirium - controlled  Extubated under my care this AM Monitor in ICU through day today Supplemental O2 as needed Lasix X 1 12/23 Recheck CXR 12/24 AM Monitor BMET intermittently Monitor I/Os Correct electrolytes as indicated Lasix X 1 12/20 SUP: IV pantoprazole NGT placed 12/19 Cont TFs DVT px: SCDs Monitor CBC intermittently Transfuse per usual guidelines Monitor temp, WBC count Micro and abx as above Cont bupropion, buspirone, citalopram, low dose quetiapine Dc scheduled clonazepam 12/23 Cont PRN lorazepam, fentanyl   CCM time: 40 mins The above time includes time spent in consultation with patient and/or family members and reviewing care plan on multidisciplinary rounds  Billy Fischeravid Revel Stellmach, MD PCCM service Mobile [phone] Pager [phone]

## 2015-01-09 NOTE — Progress Notes (Signed)
Patient ID: Brandy Robinson, female  Brandy Robinson DOB: 1966/01/13, 49 y.o.   MRN: [id number]   Extubated  abd soft, wound ok.  continue tube feeds. Cont iv abx, antifungals.  No surgical recs at this time..Marland Kitchen

## 2015-01-09 NOTE — Progress Notes (Signed)
Pt extubated by RT.  Pt tolerated well, mouth suctioned and cleaned with Chlorhexadine.  Precedex discontinued at this time.  Husband at bedside.

## 2015-01-09 NOTE — Progress Notes (Signed)
[facility] Date of Admission:  12/30/2014     ID: Brandy Robinson is a 49 y.o. female with fever s/p perforated stomach  Active Problems:   Perforated viscus   Acute respiratory failure (HCC)   Subjective: Fever to 100.5.  Drains removed  ROS  Eleven systems are reviewed and negative except per hpi  Medications:  Antibiotics Given (last 72 hours)    Date/Time Action Medication Dose Rate   01/06/15 2200 Given   piperacillin-tazobactam (ZOSYN) IVPB 3.375 g 3.375 g 12.5 mL/hr   01/07/15 0230 Given   piperacillin-tazobactam (ZOSYN) IVPB 3.375 g 3.375 g 12.5 mL/hr   01/07/15 1116 Given   piperacillin-tazobactam (ZOSYN) IVPB 3.375 g 3.375 g 12.5 mL/hr   01/07/15 1819 Given   piperacillin-tazobactam (ZOSYN) IVPB 3.375 g 3.375 g 12.5 mL/hr   01/08/15 0227 Given   piperacillin-tazobactam (ZOSYN) IVPB 3.375 g 3.375 g 12.5 mL/hr   01/08/15 0957 Given   piperacillin-tazobactam (ZOSYN) IVPB 3.375 g 3.375 g 12.5 mL/hr   01/08/15 1742 Given   piperacillin-tazobactam (ZOSYN) IVPB 3.375 g 3.375 g 12.5 mL/hr   01/09/15 0230 Given   piperacillin-tazobactam (ZOSYN) IVPB 3.375 g 3.375 g 12.5 mL/hr   01/09/15 1031 Given   piperacillin-tazobactam (ZOSYN) IVPB 3.375 g 3.375 g 12.5 mL/hr     . anidulafungin  100 mg Intravenous Q24H  . antiseptic oral rinse  7 mL Mouth Rinse QID  . budesonide (PULMICORT) nebulizer solution  0.5 mg Nebulization BID  . buPROPion  75 mg Oral BID  . busPIRone  10 mg Per Tube TID  . chlorhexidine gluconate  15 mL Mouth Rinse BID  . citalopram  10 mg Per Tube QHS  . insulin aspart  0-15 Units Subcutaneous 6 times per day  . ipratropium-albuterol  3 mL Nebulization Q6H  . pantoprazole (PROTONIX) IV  40 mg Intravenous Q24H  . piperacillin-tazobactam (ZOSYN)  IV  3.375 g Intravenous 3 times per day  . QUEtiapine  50 mg Oral BID    Objective: Vital signs in last 24 hours: Temp:  [98.9 F (37.2 C)-100.5 F (38.1 C)] 99.3 F  (37.4 C) (12/23 0800) Pulse Rate:  [62-108] 97 (12/23 1400) Resp:  [13-26] 22 (12/23 1400) BP: (93-127)/(48-69) 113/53 mmHg (12/23 1400) SpO2:  [82 %-100 %] 99 % (12/23 1400) FiO2 (%):  [35 %] 35 % (12/23 0747) Weight:  [73.5 kg (162 lb 0.6 oz)] 73.5 kg (162 lb 0.6 oz) (12/23 0450)   Lab Results  Recent Labs  01/07/15 0423  WBC 13.4*  HGB 8.2*  HCT 26.1*  NA 141  K 4.0  CL 104  CO2 32  BUN 20  CREATININE 0.61    Microbiology: @micro @ Studies/Results: Ct Abdomen Pelvis W Contrast  01/07/2015  CLINICAL DATA:  49 year old female inpatient admitted with perforated gastric ulcer status post emergent surgical repair with Brandy Robinson, now with respiratory failure requiring intubation 3 days prior with new fever and tachycardia. EXAM: CT ABDOMEN AND PELVIS WITH CONTRAST TECHNIQUE: Multidetector CT imaging of the abdomen and pelvis was performed using the standard protocol following bolus administration of intravenous contrast. CONTRAST:  100mL OMNIPAQUE IOHEXOL 300 MG/ML  SOLN COMPARISON:  12/30/2014 CT abdomen/pelvis. 01/05/2015 abdominal radiograph. FINDINGS: Study is mildly motion degraded. Lower chest: Small to moderate left and small right layering pleural effusions, new. Associated passive atelectasis in the dependent left greater than right lower lobes. There is patchy consolidation and ground-glass opacity in the non atelectatic portions of the right middle lobe,  lingula and bilateral lower lobes. Hepatobiliary: Normal liver with no liver mass. Normal gallbladder with no radiopaque cholelithiasis. No biliary ductal dilatation. Pancreas: Normal, with no mass or duct dilation. Spleen: Normal size. No mass. Adrenals/Urinary Tract: Normal adrenals. Normal kidneys with no hydronephrosis and no renal mass. Urinary bladder is collapsed by indwelling Foley catheter. Stomach/Bowel: Enteric tube terminates in the body of the stomach. Grossly normal stomach. Normal caliber small bowel with no  small bowel wall thickening. Normal appendix . Oral contrast progresses to the rectum. No large bowel wall thickening. Mild diffuse dilatation of the colon, likely representing a mild adynamic ileus. Vascular/Lymphatic: Atherosclerotic nonaneurysmal abdominal aorta. Patent portal, splenic, hepatic and renal veins. No pathologically enlarged lymph nodes in the abdomen or pelvis. Reproductive: Status post hysterectomy, with no abnormal findings at the vaginal cuff. No adnexal mass. Other: No residual of pneumoperitoneum. Trace simple free fluid in the pelvic cul-de-sac. No abdominal ascites. No focal fluid collection in the abdomen or pelvis. There are three percutaneous surgical drains in the abdomen, one of which terminates in the left subhepatic space, one of which terminates in the right paracolic region and the last of which terminates in the left anterior peritoneal cavity. Musculoskeletal: No aggressive appearing focal osseous lesions. Skin staples are noted at the vertical laparotomy site in the midline ventral abdomen, with no superficial fluid collections. Mild anasarca. IMPRESSION: 1. Patchy consolidation and ground-glass opacity in the non atelectatic portions of both lung bases, suspicious for multifocal pneumonia and/or aspiration. 2. Small moderate left and small right layering pleural effusions. Trace free fluid in the pelvic cul-de-sac. Mild anasarca. 3. Three surgical drains in the abdomen, with no focal fluid collections in the abdomen or pelvis. No residual pneumoperitoneum. 4. Mild adynamic ileus of the colon. Electronically Signed   By: Brandy Phenix M.D.   On: 01/07/2015 15:31    Assessment/Plan: Brandy Robinson is a 49 y.o. female depression, anxiety, gastric reflux, insomnia, bipolar admitted with acute perforation of peptic ulcer s/p surgery 12//13 with complicated post op course currently intubated but not on pressors, with new fevers and wbc 13. BCX UCX negative. CT abd negative for  abscess.  HIV neg, C diff negative.   Possible sources - Post op abd abscess, PNA (CXR with LLL atelectasis or infitlrate but minimal trach secretions) sinusitis (NGT in place), fungemia (no line in place, UTI (foley in place), med effect (zosyn, psych meds), wound infection (wound looks very good).  ABX course Zosyn 12/13-->> Eraxis 12/21 ->>   Recommendations Cont antifungal coverage Change zosyn to cipro and flagyl as zosyn can cause low grade fevers  Thank you very much for the consult. Will follow with you.  Brandy Robinson   01/09/2015, 2:26 PM

## 2015-01-10 ENCOUNTER — Inpatient Hospital Stay: Payer: Self-pay

## 2015-01-10 LAB — CBC
HEMATOCRIT: 26.5 % — AB (ref 35.0–47.0)
Hemoglobin: 8.6 g/dL — ABNORMAL LOW (ref 12.0–16.0)
MCH: 28.6 pg (ref 26.0–34.0)
MCHC: 32.3 g/dL (ref 32.0–36.0)
MCV: 88.6 fL (ref 80.0–100.0)
PLATELETS: 403 10*3/uL (ref 150–440)
RBC: 2.99 MIL/uL — ABNORMAL LOW (ref 3.80–5.20)
RDW: 15.7 % — AB (ref 11.5–14.5)
WBC: 14.7 10*3/uL — AB (ref 3.6–11.0)

## 2015-01-10 LAB — COMPREHENSIVE METABOLIC PANEL
ALT: 21 U/L (ref 14–54)
AST: 20 U/L (ref 15–41)
Albumin: 2.2 g/dL — ABNORMAL LOW (ref 3.5–5.0)
Alkaline Phosphatase: 102 U/L (ref 38–126)
Anion gap: 9 (ref 5–15)
BILIRUBIN TOTAL: 0.5 mg/dL (ref 0.3–1.2)
BUN: 9 mg/dL (ref 6–20)
CHLORIDE: 96 mmol/L — AB (ref 101–111)
CO2: 29 mmol/L (ref 22–32)
Calcium: 8.5 mg/dL — ABNORMAL LOW (ref 8.9–10.3)
Creatinine, Ser: 0.47 mg/dL (ref 0.44–1.00)
Glucose, Bld: 179 mg/dL — ABNORMAL HIGH (ref 65–99)
POTASSIUM: 3.9 mmol/L (ref 3.5–5.1)
Sodium: 134 mmol/L — ABNORMAL LOW (ref 135–145)
TOTAL PROTEIN: 6.8 g/dL (ref 6.5–8.1)

## 2015-01-10 LAB — GLUCOSE, CAPILLARY
GLUCOSE-CAPILLARY: 183 mg/dL — AB (ref 65–99)
GLUCOSE-CAPILLARY: 124 mg/dL — AB (ref 65–99)
GLUCOSE-CAPILLARY: 140 mg/dL — AB (ref 65–99)
GLUCOSE-CAPILLARY: 111 mg/dL — AB (ref 65–99)
GLUCOSE-CAPILLARY: 113 mg/dL — AB (ref 65–99)
Glucose-Capillary: 128 mg/dL — ABNORMAL HIGH (ref 65–99)

## 2015-01-10 MED ORDER — KETOROLAC TROMETHAMINE 30 MG/ML IJ SOLN
30.0000 mg | Freq: Three times a day (TID) | INTRAMUSCULAR | Status: DC | PRN
  Administered 2015-01-11: 30 mg via INTRAVENOUS
  Filled 2015-01-10: qty 1

## 2015-01-10 MED ORDER — CLONAZEPAM 1 MG PO TABS
1.0000 mg | ORAL_TABLET | Freq: Every day | ORAL | Status: DC
  Administered 2015-01-11 (×2): 1 mg via ORAL
  Filled 2015-01-10 (×2): qty 1

## 2015-01-10 MED ORDER — CITALOPRAM HYDROBROMIDE 20 MG PO TABS
10.0000 mg | ORAL_TABLET | Freq: Every day | ORAL | Status: DC
  Administered 2015-01-10 – 2015-01-11 (×2): 10 mg via ORAL
  Filled 2015-01-10 (×2): qty 1

## 2015-01-10 MED ORDER — INSULIN ASPART 100 UNIT/ML ~~LOC~~ SOLN
0.0000 [IU] | Freq: Three times a day (TID) | SUBCUTANEOUS | Status: DC
  Administered 2015-01-10 – 2015-01-11 (×2): 2 [IU] via SUBCUTANEOUS
  Filled 2015-01-10 (×2): qty 2

## 2015-01-10 MED ORDER — BUSPIRONE HCL 10 MG PO TABS
10.0000 mg | ORAL_TABLET | Freq: Three times a day (TID) | ORAL | Status: DC
  Administered 2015-01-10 – 2015-01-12 (×7): 10 mg via ORAL
  Filled 2015-01-10 (×2): qty 2
  Filled 2015-01-10 (×5): qty 1

## 2015-01-10 MED ORDER — IPRATROPIUM-ALBUTEROL 0.5-2.5 (3) MG/3ML IN SOLN: 3.0000 mL | RESPIRATORY_TRACT | Status: DC | PRN

## 2015-01-10 MED ORDER — INSULIN ASPART 100 UNIT/ML ~~LOC~~ SOLN: 0.0000 [IU] | Freq: Every day | SUBCUTANEOUS | Status: DC

## 2015-01-10 MED ORDER — DEXTROSE 5 % IV SOLN
INTRAVENOUS | Status: DC
  Administered 2015-01-11: 01:00:00 via INTRAVENOUS
  Filled 2015-01-10 (×2): qty 1000

## 2015-01-10 MED ORDER — ACETAMINOPHEN 325 MG PO TABS
650.0000 mg | ORAL_TABLET | Freq: Four times a day (QID) | ORAL | Status: DC | PRN
  Administered 2015-01-10: 650 mg via ORAL
  Filled 2015-01-10 (×2): qty 2

## 2015-01-10 NOTE — Progress Notes (Signed)
Speech Language Pathology Treatment: Dysphagia  Patient Details Name: Brandy Robinson MRN: [id number] DOB: [birth year] Today's Date: 01/10/2015 Time: 1200-1300 SLP Time Calculation (min) (ACUTE ONLY): 60 min  Assessment / Plan / Recommendation Clinical Impression  Pt is more awake, response to others but continues to present w/ AMS which greatly increases risk for aspiration. Pt's NG was removed sec. To pt unable to maintain HOB elevation appropriately per NSG. Pt was given trials of ice chips, thin liquids, and purees. Pt only accepted 1/4(and less) tsp amounts of the puree and water; single ice chips. A mild, delayed cough/throat clear was noted x1 w/ trials of thin liquids. Unsure if related to laryngeal penetration or aspiration; no decline in O2 sats noted at the time. Vocal quality remained clear b/t trials. Pt required moderate verbal cues to redirect/attend to task; she was nervous about taking the po's as well. Trials of solid foods were not attempted sec. To pt's presentation above today.  Pt presents w/ increased risk for aspiration sec. To recent lengthy oral intubation as well as d/t her declined mental status currently. Rec. Modifying oral diet (ordered by MD) to a Dys. 1 w/ thin liquids to decreased texture in foods; strict aspiration precautions and feeding assistance w/ all po's - NSG supervision. Rec. Meds Crushed in Puree - consult Pharmacy re: this. Rec. NPO status if any overt s/s of aspiration are noted during attempts at po's/meals or meds. ST will f/u next 1-2 days w/ toleration of diet; trials to upgrade or objective swallow study if indicated. NSG consulted re: above and agreed. Rec. Dietician input d/t pt's limited acceptance of po intake(volume). Pt/husband informed of today's tx session and poc; both agreed verbally.      HPI HPI: Brandy Robinson is a 49 y.o. female with a known history of depression, anxiety, gastric reflux, insomnia, bipolar disorder- takes ibuprofen and  Goody powders at home for her pain. He came to emergency room in night with severe abdominal pain and found having free air in peritoneum, taken to the OR by surgical team for perforated gastric ulcer, and postsurgery admitted in stepdown unit. She remained tachycardiac and could not be given any oral medications so medical consult was called in. Pt was intubated on 12/18 and extubated 12.23.16. NG tube was removed also. Pt requires redirection to attend to tasks; moving about in bed. Family member present. Pt has been NPO since extubation sec. to BSE results yesterday. MD has ordered an oral diet today. Pt continues to present w/ AMS; see MD note indicating encephalopathy and that nutritional support may be necessary.       SLP Plan  Continue with current plan of care;Goals updated     Recommendations  Diet recommendations: Dysphagia 1 (puree);Thin liquid Liquids provided via: Cup;No straw Medication Administration: Crushed with puree Supervision: Staff to assist with self feeding;Full supervision/cueing for compensatory strategies Compensations: Minimize environmental distractions;Slow rate;Small sips/bites;Multiple dry swallows after each bite/sip Postural Changes and/or Swallow Maneuvers: Seated upright 90 degrees              General recommendations: Rehab consult (possible need for Cognitive evaluation ) Oral Care Recommendations: Oral care BID;Staff/trained caregiver to provide oral care Follow up Recommendations: Skilled Nursing facility Plan: Continue with current plan of care;Goals updated  Brandy SomKatherine Watson, MS, CCC-SLP  Robinson,Brandy 01/10/2015, 2:19 PM

## 2015-01-10 NOTE — Progress Notes (Signed)
PULMONARY / CRITICAL CARE MEDICINE   Name: Brandy Robinson MRN: [id number] DOB: Mar 30, 1965    ADMISSION DATE:  12/30/2014  BRIEF HISTORY: 49 year old female past medical history of depression, anxiety, gastric reflux, insomnia, bipolar, takes ibuprofen and powders for pain at home. Admitted with severe abdominal pain, found to have free air in the peritoneum, taken today or for perforated gastric ulcer surgery with worsening confusion and hypotension, transferred to ICU with elevated heart rate, status post open laparotomy for perforated viscus..  SUBJECTIVE:  Tolerating extubation well. Remains poorly oriented but much more awake and alert. No distress  VITAL SIGNS: Temp:  [98.6 F (37 C)-99.1 F (37.3 C)] 98.6 F (37 C) (12/24 0000) Pulse Rate:  [86-116] 93 (12/24 0900) Resp:  [15-30] 19 (12/24 0900) BP: (94-124)/(53-76) 108/53 mmHg (12/24 0900) SpO2:  [93 %-100 %] 100 % (12/24 0900) FiO2 (%):  [28 %] 28 % (12/24 0836) Weight:  [69.6 kg (153 lb 7 oz)] 69.6 kg (153 lb 7 oz) (12/24 0443) HEMODYNAMICS:   VENTILATOR SETTINGS: Vent Mode:  [-]  FiO2 (%):  [28 %] 28 % INTAKE / OUTPUT:  Intake/Output Summary (Last 24 hours) at 01/10/15 1411 Last data filed at 01/10/15 0600  Gross per 24 hour  Intake 2124.08 ml  Output   3050 ml  Net -925.92 ml    Review of Systems  Unable to perform ROS: mental acuity    Physical Exam  Constitutional: She is well-developed, well-nourished, and in no distress.  HENT:  Head: Normocephalic and atraumatic.  Eyes: Pupils are equal, round, and reactive to light.  Neck: No JVD present.  Cardiovascular: Normal rate, regular rhythm and intact distal pulses.  Exam reveals no friction rub.   No murmur heard. Pulmonary/Chest: Effort normal and breath sounds normal.  Abdominal: Soft. Bowel sounds are normal. She exhibits no distension.  Incision intact, no purulent drainage, bandages in place  Musculoskeletal: She exhibits no edema.   Neurological: She is alert. She has normal reflexes. No cranial nerve deficit.  Skin: Skin is warm and dry.     LABS:  CBC  Recent Labs Lab 01/06/15 0430 01/07/15 0423 01/10/15 0446  WBC 13.5* 13.4* 14.7*  HGB 7.7* 8.2* 8.6*  HCT 24.4* 26.1* 26.5*  PLT 344 326 403   Coag's No results for input(s): APTT, INR in the last 168 hours. BMET  Recent Labs Lab 01/06/15 0430 01/07/15 0423 01/10/15 0446  NA 147* 141 134*  K 3.5 4.0 3.9  CL 113* 104 96*  CO2 28 32 29  BUN 27* 20 9  CREATININE 0.66 0.61 0.47  GLUCOSE 137* 128* 179*   Electrolytes  Recent Labs Lab 01/06/15 0430 01/07/15 0423 01/10/15 0446  CALCIUM 7.7* 7.6* 8.5*   Sepsis Markers No results for input(s): LATICACIDVEN, PROCALCITON, O2SATVEN in the last 168 hours. ABG  Recent Labs Lab 01/04/15 1100 01/04/15 1610 01/04/15 2125  PHART 7.47* 7.46* 7.51*  PCO2ART 43 44 42  PO2ART 66* 66* 131*   Liver Enzymes  Recent Labs Lab 01/06/15 0430 01/10/15 0446  AST 41 20  ALT 43 21  ALKPHOS 97 102  BILITOT 0.5 0.5  ALBUMIN 2.1* 2.2*   Cardiac Enzymes No results for input(s): TROPONINI, PROBNP in the last 168 hours. Glucose  Recent Labs Lab 01/09/15 1601 01/09/15 1952 01/09/15 2334 01/10/15 0409 01/10/15 0740 01/10/15 1114  GLUCAP 137* 112* 130* 183* 124* 140*    Imaging Dg Chest Port 1 View  01/10/2015  CLINICAL DATA:  Respiratory failure EXAM: PORTABLE  CHEST 1 VIEW COMPARISON:  01/07/2015 FINDINGS: Endotracheal tube removed.  NG tube remains in place. Improved aeration of the lungs with decrease in bilateral edema. Decrease in bibasilar atelectasis. Small left pleural effusion remains. IMPRESSION: Resolving congestive heart failure with edema. Electronically Signed   By: Marlan Palauharles  Clark M.D.   On: 01/10/2015 09:01    LINES/TUBES: ETT 12/18 >> 12/23  MICRO: MRSA PCR 12/14 >> NEG Urine 12/13 >> NEG Blood 12/13 >> NEG resp 12/18 >> NEG  ANTIBIOTICS Zosyn 12/14 >>  12/24 anidulafungin 12/22 >>  Cipro 12/23 >>  Metronidazole 12/23 >>   ASSESSMENT / PLAN: Post laparotomy 12/13 for perforated gastric ulcer Acute resp failure - intubated for extreme agitation and concomitant respiratory distress  Appears resolved Bilateral pulmonary infiltrates on CXR, resolved Hypernatremia, resolved Hypokalemia, resolved Elevated ammonia level of unclear significance Severe sepsis, peritonitis - resolving Anemia without overt bleeding presently Bipolar disorder with multiple chronic psych meds Severe intermittent agitated delirium - controlled  Transfer to med-surg Cont supplemental O2 as needed Monitor BMET intermittently Monitor I/Os Correct electrolytes as indicated DC NGT  Begin diet - SLP to reassess today DVT px: SCDs Monitor CBC intermittently Transfuse per usual guidelines Monitor temp, WBC count Micro and abx as above - suspect can DC soon. ID following Cont bupropion, buspirone, citalopram, low dose quetiapine  PCCM will sign off. Please call if we can be of further assistance  Billy Fischeravid Lasharon Dunivan, MD PCCM service Mobile [phone] Pager [phone]

## 2015-01-10 NOTE — Progress Notes (Signed)
Surgery  POD 10  Remains stable but encephalopathic   NGT removed for unclear reasons...  Now have no enteral access  May well need TPN due to altered MS and failing swallow test yesterday  Exam  Lethargic abd soft, wound c/d/i  Labs reviewed.  No new recs at this time.

## 2015-01-10 NOTE — Progress Notes (Signed)
Nutrition Follow-up     INTERVENTION:   Meals and snacks: Pt started on po diet per MD Simonds. Monitor tolerance and intake. SLP following Medical Nutrition Supplement Therapy: If unable to meet nutritional needs will add supplement for additional kcals and protein   NUTRITION DIAGNOSIS:   Inadequate oral intake related to acute illness, altered GI function as evidenced by NPO status.    GOAL:   Patient will meet greater than or equal to 90% of their needs    MONITOR:    (Energy Intake, Anthropometrics, Digestive System, Electrolyte/Renal Profile, Glucose Profile)  REASON FOR ASSESSMENT:   LOS    ASSESSMENT:      Current Nutrition: Noted TF held last night secondary to pt restless and unable to keep HOB > 30 degrees.  NG removed this am per MD order and diet started.  SLP following   Gastrointestinal Profile: Last BM: 12/23   Scheduled Medications:  . anidulafungin  100 mg Intravenous Q24H  . antiseptic oral rinse  7 mL Mouth Rinse QID  . buPROPion  75 mg Oral BID  . busPIRone  10 mg Oral TID  . chlorhexidine gluconate  15 mL Mouth Rinse BID  . ciprofloxacin  400 mg Intravenous Q12H  . citalopram  10 mg Oral QHS  . insulin aspart  0-15 Units Subcutaneous TID WC  . insulin aspart  0-5 Units Subcutaneous QHS  . metronidazole  500 mg Intravenous Q8H  . QUEtiapine  50 mg Oral BID       Electrolyte/Renal Profile and Glucose Profile:   Recent Labs Lab 01/06/15 0430 01/07/15 0423 01/10/15 0446  NA 147* 141 134*  K 3.5 4.0 3.9  CL 113* 104 96*  CO2 28 32 29  BUN 27* 20 9  CREATININE 0.66 0.61 0.47  CALCIUM 7.7* 7.6* 8.5*  GLUCOSE 137* 128* 179*   Protein Profile:  Recent Labs Lab 01/06/15 0430 01/10/15 0446  ALBUMIN 2.1* 2.2*      Weight Trend since Admission: Filed Weights   01/08/15 0740 01/09/15 0450 01/10/15 0443  Weight: 162 lb 11.2 oz (73.8 kg) 162 lb 0.6 oz (73.5 kg) 153 lb 7 oz (69.6 kg)      Diet Order:  Diet Carb Modified  Fluid consistency:: Thin; Room service appropriate?: Yes with Assist  Skin:  Reviewed, no issues   Height:   Ht Readings from Last 1 Encounters:  12/30/14 5\' 3"  (1.6 m)    Weight:   Wt Readings from Last 1 Encounters:  01/10/15 153 lb 7 oz (69.6 kg)    Ideal Body Weight:     BMI:  Body mass index is 27.19 kg/(m^2).  Estimated Nutritional Needs:   Kcal:  [id number] kcals (BEE 1339, 1.3 AF, 1.1-1.3 IF)   Protein:  89-111 g (1.2-1.5 g/kg)   Fluid:  1850-2220 mL (25-30 ml/kg)   EDUCATION NEEDS:   No education needs identified at this time  MODERATE Care Level  Sharetha Newson B. Freida BusmanAllen, RD, LDN [phone] (pager) Weekend/On-Call pager [phone])

## 2015-01-10 NOTE — Progress Notes (Signed)
Dr. Tonita CongWoodham informed that tube feeding held due to risk of aspiration, based on patient's activities. MD in agreement to hold tube feeding.

## 2015-01-10 NOTE — Progress Notes (Signed)
PT Cancellation Note  Patient Details Name: Margaree MackintoshKimberly C Branson MRN: [id number] DOB: Jul 31, 1965   Cancelled Treatment:    Reason Eval/Treat Not Completed: Medical issues which prohibited therapy;Other (comment) (Completely lethargic, try later).   Ivar DrapeStout, Fantashia Shupert E 01/10/2015, 11:45 AM   Samul Dadauth Terrius Gentile, PT MS Acute Rehab Dept. Number: [facility] [phone] and [facility] [phone]

## 2015-01-10 NOTE — Progress Notes (Signed)
Pt tube feeding paused at this time since patient remains restless and will not keep her head above 30 degrees by sliding down in bed. Staff has been trying to maintain HOB>30 degrees unscussessfully.

## 2015-01-11 LAB — URINE CULTURE
Culture: >=100000
Special Requests: NORMAL

## 2015-01-11 LAB — GLUCOSE, CAPILLARY
GLUCOSE-CAPILLARY: 106 mg/dL — AB (ref 65–99)
GLUCOSE-CAPILLARY: 122 mg/dL — AB (ref 65–99)
GLUCOSE-CAPILLARY: 107 mg/dL — AB (ref 65–99)
Glucose-Capillary: 98 mg/dL (ref 65–99)

## 2015-01-11 MED ORDER — ACETAMINOPHEN 325 MG PO TABS: 650.0000 mg | ORAL_TABLET | ORAL | Status: DC | PRN

## 2015-01-11 MED ORDER — TRAMADOL HCL 50 MG PO TABS
50.0000 mg | ORAL_TABLET | ORAL | Status: DC | PRN
  Administered 2015-01-11 – 2015-01-12 (×3): 50 mg via ORAL
  Filled 2015-01-11 (×3): qty 1

## 2015-01-11 NOTE — Progress Notes (Signed)
Afebrile, stable vital signs.  Awake, alert, orientated.  Minimal complaints of pain. Denies shortness of breath.  Chest exam: Clear.  Cardiac: Regular rhythm.  Abdomen: Soft, nontender.  Wound: Clean. Staples intact.  Foley catheter in place since surgery. Urine output. We'll discontinue.  Antibiotic regimen being managed by infectious disease.  Tolerating diet well. No indication for supplemental feeds at this time.  We will try oral tramadol and place of IV morphine. Hold Toradol based on history of perforated gastric ulcer.

## 2015-01-12 LAB — CULTURE, BLOOD (ROUTINE X 2)
CULTURE: NO GROWTH
CULTURE: NO GROWTH

## 2015-01-12 LAB — CBC
HEMATOCRIT: 29.4 % — AB (ref 35.0–47.0)
Hemoglobin: 9.3 g/dL — ABNORMAL LOW (ref 12.0–16.0)
MCH: 28.2 pg (ref 26.0–34.0)
MCHC: 31.8 g/dL — AB (ref 32.0–36.0)
MCV: 88.8 fL (ref 80.0–100.0)
PLATELETS: 559 10*3/uL — AB (ref 150–440)
RBC: 3.31 MIL/uL — ABNORMAL LOW (ref 3.80–5.20)
RDW: 15.7 % — AB (ref 11.5–14.5)
WBC: 11 10*3/uL (ref 3.6–11.0)

## 2015-01-12 LAB — BASIC METABOLIC PANEL
ANION GAP: 11 (ref 5–15)
BUN: 10 mg/dL (ref 6–20)
CALCIUM: 8.9 mg/dL (ref 8.9–10.3)
CO2: 25 mmol/L (ref 22–32)
CREATININE: 0.64 mg/dL (ref 0.44–1.00)
Chloride: 99 mmol/L — ABNORMAL LOW (ref 101–111)
Glucose, Bld: 108 mg/dL — ABNORMAL HIGH (ref 65–99)
Potassium: 4 mmol/L (ref 3.5–5.1)
Sodium: 135 mmol/L (ref 135–145)

## 2015-01-12 LAB — GLUCOSE, CAPILLARY
Glucose-Capillary: 111 mg/dL — ABNORMAL HIGH (ref 65–99)
Glucose-Capillary: 116 mg/dL — ABNORMAL HIGH (ref 65–99)

## 2015-01-12 LAB — MAGNESIUM: Magnesium: 2.1 mg/dL (ref 1.7–2.4)

## 2015-01-12 LAB — PHOSPHORUS: PHOSPHORUS: 3.8 mg/dL (ref 2.5–4.6)

## 2015-01-12 MED ORDER — METRONIDAZOLE 500 MG PO TABS: 500.0000 mg | ORAL_TABLET | Freq: Three times a day (TID) | ORAL | Status: DC

## 2015-01-12 MED ORDER — CIPROFLOXACIN HCL 500 MG PO TABS: 500.0000 mg | ORAL_TABLET | Freq: Two times a day (BID) | ORAL | Status: DC

## 2015-01-12 MED ORDER — PANTOPRAZOLE SODIUM 40 MG PO TBEC
40.0000 mg | DELAYED_RELEASE_TABLET | Freq: Every day | ORAL | Status: DC
  Administered 2015-01-12: 40 mg via ORAL
  Filled 2015-01-12: qty 1

## 2015-01-12 MED ORDER — FLUCONAZOLE 100 MG PO TABS: 100.0000 mg | ORAL_TABLET | Freq: Every day | ORAL | Status: DC

## 2015-01-12 MED ORDER — TRAMADOL HCL 50 MG PO TABS: 50.0000 mg | ORAL_TABLET | ORAL | Status: DC | PRN

## 2015-01-12 MED ORDER — PANTOPRAZOLE SODIUM 40 MG PO TBEC: 40.0000 mg | DELAYED_RELEASE_TABLET | Freq: Every day | ORAL | Status: DC

## 2015-01-12 NOTE — Discharge Summary (Signed)
Physician Discharge Summary  Patient ID: Brandy Robinson MRN: [id number] DOB/AGE: 1965-07-30 49 y.o.  Admit date: 12/30/2014 Discharge date: 01/12/2015  Admission Diagnoses:  Perforated Gastric Ulcer  Discharge Diagnoses:  Active Problems:   Perforated viscus   Acute respiratory failure (HCC)   Discharged Condition: good  Hospital Course: 49 yr old with perforated gastric ulcer complicated by sepsis, acute respiratory failure, and CHF.  Patient doing much better now.  She is up and walking in hallways, pain controlled with po tramadol.  Her white count has now normalized on Cipro, Flagyl and antifungal, will d/c on po same and diflucan for seven more days.  She is tolerating a diet and is having BMs that are easy to pass.   Consults: pulmonary/intensive care and ID    Treatments: antibiotics: Cipro and metronidazole and surgery: Ex Lap with Cheree Ditto patch  Discharge Exam: Blood pressure 107/46, pulse 91, temperature 98.5 F (36.9 C), temperature source Oral, resp. rate 16, height  (1.6 m), weight 153 lb 7 oz (69.6 kg), SpO2 96 %. General appearance: alert, cooperative and no distress Resp: clear to auscultation bilaterally Cardio: RRR GI: soft, appropraitely tender, non distended, staples in place to midline incision, no erythema or drainage.  Extremities: extremities normal, atraumatic, no cyanosis or edema  Disposition: 01-Home or Self Care  Discharge Instructions    (HEART FAILURE PATIENTS) Call MD:  Anytime you have any of the following symptoms: 1) 3 pound weight gain in 24 hours or 5 pounds in 1 week 2) shortness of breath, with or without a dry hacking cough 3) swelling in the hands, feet or stomach 4) if you have to sleep on extra pillows at night in order to breathe.    Complete by:  As directed      Call MD for:  persistant nausea and vomiting    Complete by:  As directed      Call MD for:  redness, tenderness, or signs of infection (pain, swelling, redness,  odor or green/yellow discharge around incision site)    Complete by:  As directed      Call MD for:  severe uncontrolled pain    Complete by:  As directed      Call MD for:  temperature >100.4    Complete by:  As directed      Diet Carb Modified    Complete by:  As directed      Driving Restrictions    Complete by:  As directed   No driving while on prescription pain medication     Increase activity slowly    Complete by:  As directed      Lifting restrictions    Complete by:  As directed   No lifting more than 10lbs for 4 weeks     May shower / Bathe    Complete by:  As directed      May walk up steps    Complete by:  As directed      No wound care    Complete by:  As directed             Medication List    STOP taking these medications        oxyCODONE-acetaminophen 5-325 MG tablet  Commonly known as:  ROXICET      TAKE these medications        buPROPion 150 MG 12 hr tablet  Commonly known as:  WELLBUTRIN SR  Take 150 mg by mouth daily.  busPIRone 10 MG tablet  Commonly known as:  BUSPAR  Take 20 mg by mouth 3 (three) times daily.     ciprofloxacin 500 MG tablet  Commonly known as:  CIPRO  Take 1 tablet (500 mg total) by mouth 2 (two) times daily.     citalopram 20 MG tablet  Commonly known as:  CELEXA  Take 20 mg by mouth at bedtime.     clonazePAM 1 MG tablet  Commonly known as:  KLONOPIN  Take 1 mg by mouth at bedtime.     conjugated estrogens vaginal cream  Commonly known as:  PREMARIN  Place 0.5 Applicatorfuls vaginally every Monday, Wednesday, and Friday.     estradiol 1 MG tablet  Commonly known as:  ESTRACE  Take 1 tablet (1 mg total) by mouth daily.     fluconazole 100 MG tablet  Commonly known as:  DIFLUCAN  Take 1 tablet (100 mg total) by mouth daily.     metoCLOPramide 10 MG tablet  Commonly known as:  REGLAN  Take 1 tablet (10 mg total) by mouth 3 (three) times daily with meals.     metroNIDAZOLE 500 MG tablet  Commonly known  as:  FLAGYL  Take 1 tablet (500 mg total) by mouth 3 (three) times daily.     mometasone 0.1 % ointment  Commonly known as:  ELOCON  Apply 1 application topically 2 (two) times a week.     omeprazole 20 MG capsule  Commonly known as:  PRILOSEC  Take 20 mg by mouth 2 (two) times daily.     pantoprazole 40 MG tablet  Commonly known as:  PROTONIX  Take 1 tablet (40 mg total) by mouth daily.     QUEtiapine 50 MG tablet  Commonly known as:  SEROQUEL  Take 50 mg by mouth 2 (two) times daily.     QUEtiapine 300 MG 24 hr tablet  Commonly known as:  SEROQUEL XR  Take 300 mg by mouth at bedtime.     traMADol 50 MG tablet  Commonly known as:  ULTRAM  Take 1 tablet (50 mg total) by mouth every 4 (four) hours as needed for moderate pain or severe pain.           Follow-up Information    Follow up with [facility]. Call in 1 day.   Why:  Call tomorrow and get appointment for 1-2 weeks from now for wound check   Contact information:   [address] [place] [id number] [phone]      Signed: Gladis RiffleCatherine L Thinh Cuccaro 01/12/2015, 1:29 PM

## 2015-01-12 NOTE — Progress Notes (Signed)
Physical Therapy Evaluation Patient Details Name: Brandy Robinson MRN: [id number] DOB: [birth year] Today's Date: 01/12/2015   History of Present Illness  Patient is a 49 y.o. female admitted on 13 Dec. for perforated viscus. s/p lapartomy. Patient's status complicated by sepsis and acute RF.  Clinical Impression  Patient is a previously independent female who lives in mobile home with 91 y.o. Son. Patient states that more recently she has become worried about falls when going to the bathroom at night. Notes increased dizziness since being in the hospital. Upon evaluation, patient moderately unsteady with HHA when ambulating 10'. Patient's visual field assessed with no complications. Deferred vestibular assessment. No hx of falls. Patient will benefit from RW at discharge to improve balance and gait. Recommended further evaluation for dizziness in outpatient PT setting if indicated.    Follow Up Recommendations Outpatient PT    Equipment Recommendations  Rolling walker with 5" wheels    Recommendations for Other Services       Precautions / Restrictions Precautions Precautions: None Restrictions Weight Bearing Restrictions: No      Mobility  Bed Mobility Overal bed mobility: Independent                Transfers Overall transfer level: Modified independent Equipment used: 1 person hand held assist             General transfer comment: Patient moves from sit to stand with mild dizziness that subsided <2 minutes.   Ambulation/Gait Ambulation/Gait assistance: Min assist Ambulation Distance (Feet): 10 Feet Assistive device: 1 person hand held assist       General Gait Details: Patient ambulates with 1 person HHA, grabbing onto countertop to steady. Complains of moderate dizziness.  Stairs            Wheelchair Mobility    Modified Rankin (Stroke Patients Only)       Balance Overall balance assessment: Needs assistance Sitting-balance support: Feet  supported Sitting balance-Leahy Scale: Good     Standing balance support: Single extremity supported Standing balance-Leahy Scale: Fair Standing balance comment: Patient has difficulty weightshifting, complains of dizziness                             Pertinent Vitals/Pain Pain Assessment: No/denies pain    Home Living Family/patient expects to be discharged to:: Private residence Living Arrangements: Children Available Help at Discharge: Available 24 hours/day Type of Home: Mobile home Home Access: Stairs to enter Entrance Stairs-Rails: Can reach both Entrance Stairs-Number of Steps: 6 Home Layout: One level Home Equipment: None      Prior Function Level of Independence: Independent         Comments: Patient previously independent with mobility and ADLs     Hand Dominance        Extremity/Trunk Assessment   Upper Extremity Assessment: Overall WFL for tasks assessed           Lower Extremity Assessment: Overall WFL for tasks assessed         Communication   Communication: No difficulties  Cognition Arousal/Alertness: Awake/alert Behavior During Therapy: WFL for tasks assessed/performed Overall Cognitive Status: Within Functional Limits for tasks assessed                      General Comments      Exercises        Assessment/Plan    PT Assessment Patient needs continued PT services  PT Diagnosis Difficulty  walking   PT Problem List Decreased balance;Decreased mobility;Decreased knowledge of use of DME;Decreased activity tolerance  PT Treatment Interventions DME instruction;Gait training;Stair training;Functional mobility training;Therapeutic activities;Balance training;Patient/family education   PT Goals (Current goals can be found in the Care Plan section) Acute Rehab PT Goals Patient Stated Goal: "To go home" PT Goal Formulation: With patient Time For Goal Achievement: 01/26/15 Potential to Achieve Goals: Good     Frequency Min 2X/week   Barriers to discharge        Co-evaluation               End of Session Equipment Utilized During Treatment: Gait belt Activity Tolerance: Patient tolerated treatment well;Other (comment) (Limited by dizziness) Patient left: in bed;with call bell/phone within reach;with bed alarm set Nurse Communication: Mobility status         Time: 1345-1400 PT Time Calculation (min) (ACUTE ONLY): 15 min   Charges:   PT Evaluation $Initial PT Evaluation Tier I: 1 Procedure     PT G Codes:        Neita CarpJulie Ann Johann Gascoigne, PT, DPT 01/12/2015, 2:31 PM

## 2015-01-12 NOTE — Progress Notes (Signed)
Pt A and O x 4. VSS. No complaints of pain or nausea. IV removed intact, prescriptions given. Pt voiced understanding of discharge instructions with no further questions. Pt discharged via wheelchair with nurse aide.

## 2015-01-12 NOTE — Care Management Note (Signed)
Case Management Note  Patient Details  Name: Brandy Robinson MRN: [id number] Date of Birth: 03-12-1965  Subjective/Objective:    Attempted to send uninsured [facility] with meds but they are closed today. This Clinical research associatewriter enrolled Brandy Robinson into the [facility] Medication program and explained to her how [facility]. For future medication needs and/or refills, encouraged Brandy Robinson to call and make an appointment with either the [facility], [facility], or [facility]. No home health orders.                 Action/Plan:   Expected Discharge Date:                  Expected Discharge Plan:     In-House Referral:     Discharge planning Services     Post Acute Care Choice:    Choice offered to:     DME Arranged:    DME Agency:     HH Arranged:    HH Agency:     Status of Service:     Medicare Important Message Given:    Date Medicare IM Given:    Medicare IM give by:    Date Additional Medicare IM Given:    Additional Medicare Important Message give by:     If discussed at Long Length of Stay Meetings, dates discussed:    Additional Comments:  Darryle Dennie A, RN 01/12/2015, 3:46 PM

## 2015-01-13 ENCOUNTER — Telehealth: Payer: Self-pay | Admitting: General Surgery

## 2015-01-13 NOTE — Telephone Encounter (Signed)
Patient had called to set up a time next week to have her staples removed, while talking to her she states she coughed this morning and her wound started draining - it is a light pink color - it is a lot - she has had to change her shirt twice. Please call as soon as possible.

## 2015-01-13 NOTE — Telephone Encounter (Signed)
Returned phone call to patient at this time. It is draining slightly a serosanginous drainage at this time. Staples are still intact per patient. Denies fever, nausea, and vomiting. I explained that she may need to keep a dressing over this area until it is completely sealed.   Post-op appointment made to see Dr. Tonita CongWoodham on 01/28/15. Patient is encouraged to call if she develops a fever, redness at incision site, or drainage of pus from incision. She verbalizes understanding of this.

## 2015-01-26 ENCOUNTER — Encounter: Payer: Self-pay | Admitting: General Surgery

## 2015-01-28 ENCOUNTER — Ambulatory Visit (INDEPENDENT_AMBULATORY_CARE_PROVIDER_SITE_OTHER): Payer: No Typology Code available for payment source | Admitting: General Surgery

## 2015-01-28 ENCOUNTER — Encounter: Payer: Self-pay | Admitting: General Surgery

## 2015-01-28 VITALS — BP 127/79 | HR 111 | Temp 97.9°F | Wt 142.0 lb

## 2015-01-28 DIAGNOSIS — R69 Illness, unspecified: Secondary | ICD-10-CM

## 2015-01-28 DIAGNOSIS — R198 Other specified symptoms and signs involving the digestive system and abdomen: Secondary | ICD-10-CM

## 2015-01-28 MED ORDER — TRAMADOL HCL 50 MG PO TABS: 50.0000 mg | ORAL_TABLET | Freq: Four times a day (QID) | ORAL | Status: DC | PRN

## 2015-01-28 MED ORDER — CELECOXIB 100 MG PO CAPS: 100.0000 mg | ORAL_CAPSULE | Freq: Two times a day (BID) | ORAL | Status: DC

## 2015-01-28 NOTE — Patient Instructions (Signed)
Please give us a call if you have any questions or concerns. However, if you notice any redness, fever and/or drainage-call our office.

## 2015-01-29 NOTE — Progress Notes (Signed)
Outpatient Surgical Follow Up  01/29/2015  Brandy Robinson is an 50 y.o. female.   Chief Complaint  Patient presents with  . Routine Post Op    Exploratory Laparotomy Dr. Tonita CongWoodham 12/30/2014    HPI:  50 year old female returns to clinic approximately 1 month status post export her laparotomy for a perforated peptic ulcer. Patient reports no pain. She has been eating well and having normal bowel function. Patient denies any fevers, chills, nausea, vomiting, chest pain, shortness of breath, diarrhea, constipation. Staples remain in place her mid abdomen that she wants removed.  Past Medical History  Diagnosis Date  . Wears glasses   . Depression   . Anxiety   . GERD (gastroesophageal reflux disease)   . Vaginal atrophy   . Lichen   . Surgical menopause   . Endometriosis   . Dyspareunia   . Insomnia   . Bipolar disorder [facility](HCC)     Past Surgical History  Procedure Laterality Date  . Abdominal hysterectomy  1995    [facility]-bso  . Arm wound repair / closure  2013    cellulitis rt arm-i/d  . Knee bursectomy Left 09/24/2013    Procedure: IRRIGATION AND DEBRIDEMENT OF LEFT KNEE ;  Surgeon: Sheral Apleyimothy D Murphy, MD;  Location: [facility];  Service: Orthopedics;  Laterality: Left;  . Laparotomy N/A 12/30/2014    Procedure: EXPLORATORY LAPAROTOMY-graham patch of peptic ulcer;  Surgeon: Brandy Frameharles Dmiyah Liscano, MD;  Location: [facility];  Service: General;  Laterality: N/A;    Family History  Problem Relation Age of Onset  . Diabetes Mother   . Diabetes Sister   . Cancer Neg Hx   . Heart disease Neg Hx     Social History:  reports that she has been smoking Cigarettes.  She has been smoking about 1.00 pack per day. She does not have any smokeless tobacco history on file. She reports that she does not drink alcohol or use illicit drugs.  Allergies:  Allergies  Allergen Reactions  . Hydrocodone Itching  . Aspirin Other (See Comments)    Reaction:  GI upset   . Morphine And Related  Itching, Nausea And Vomiting and Other (See Comments)    Reaction:  Chest pain    Medications reviewed.    ROS  multipoint review of systems was completed, all pertinent positives and negatives within the history of present illness the remainder negative.   BP 127/79 mmHg  Pulse 111  Temp(Src) 97.9 F (36.6 C) (Oral)  Wt 64.411 kg (142 lb)  Physical Exam  Gen.: No acute distress Chest: Clear to auscultation Heart: Tachycardic Abdomen: Soft, nontender, nondistended. Well approximated midline incision with staples in place. No evidence of erythema or drainage.    No results found for this or any previous visit (from the past 48 hour(s)). No results found.  Assessment/Plan:  1. Perforated viscus  50 year old female one month status post export her laparotomy for perforated peptic ulcer. Discussed the importance of continuing her acid suppression for the rest of her life. Also discussed the importance of not taking chronic NSAIDs. Patient voiced understanding. Discussed the signs and symptoms of infection or hernia formation. Patient to return to clinic should they occur. Patient will follow up on an as-needed basis.     Brandy Frameharles Eren Puebla, MD FACS General Surgeon  01/29/2015,8:27 AM

## 2015-05-12 ENCOUNTER — Other Ambulatory Visit: Payer: Self-pay

## 2015-05-12 MED ORDER — PANTOPRAZOLE SODIUM 40 MG PO TBEC: 40.0000 mg | DELAYED_RELEASE_TABLET | Freq: Every day | ORAL | Status: DC

## 2015-05-12 NOTE — Telephone Encounter (Signed)
Refilled patient's Pantoprazole for 1 month and informed her that she would need to establish a PCP or see her PCP in order to get a refill on this.

## 2015-05-13 ENCOUNTER — Telehealth: Payer: Self-pay | Admitting: General Surgery

## 2015-05-13 MED ORDER — PANTOPRAZOLE SODIUM 40 MG PO TBEC: 40.0000 mg | DELAYED_RELEASE_TABLET | Freq: Every day | ORAL | Status: DC

## 2015-05-13 NOTE — Telephone Encounter (Signed)
Patient would like for you to call in her medication to [facility] [phone] not [facility].

## 2015-05-13 NOTE — Telephone Encounter (Signed)
Sent to [facility] at this time.

## 2015-08-06 ENCOUNTER — Other Ambulatory Visit: Payer: Self-pay | Admitting: Urology

## 2015-08-12 ENCOUNTER — Other Ambulatory Visit: Payer: Self-pay | Admitting: Nurse Practitioner

## 2015-09-25 NOTE — Progress Notes (Deleted)
ANNUAL PREVENTATIVE CARE GYN  ENCOUNTER NOTE  Subjective:       Brandy Robinson is a 50 y.o. No obstetric history on file. female here for a routine annual gynecologic exam.  Current complaints: 1.      Gynecologic History No LMP recorded. Patient has had a hysterectomy. Contraception: status post hysterectomy Last Pap: unsure. Results were: normal Last mammogram: ?. Results were:   Obstetric History OB History  No data available    Past Medical History:  Diagnosis Date  . Anxiety   . Bipolar disorder (HCC)   . Depression   . Dyspareunia   . Endometriosis   . GERD (gastroesophageal reflux disease)   . Insomnia   . Lichen   . Surgical menopause   . Vaginal atrophy   . Wears glasses     Past Surgical History:  Procedure Laterality Date  . ABDOMINAL HYSTERECTOMY  1995   [facility]  . ARM WOUND REPAIR / CLOSURE  2013   cellulitis rt arm-i/d  . KNEE BURSECTOMY Left 09/24/2013   Procedure: IRRIGATION AND DEBRIDEMENT OF LEFT KNEE ;  Surgeon: Sheral Apleyimothy D Murphy, MD;  Location: [facility];  Service: Orthopedics;  Laterality: Left;  . LAPAROTOMY N/A 12/30/2014   Procedure: EXPLORATORY LAPAROTOMY-graham patch of peptic ulcer;  Surgeon: Ricarda Frameharles Woodham, MD;  Location: [facility];  Service: General;  Laterality: N/A;    Current Outpatient Prescriptions on File Prior to Visit  Medication Sig Dispense Refill  . buPROPion (WELLBUTRIN SR) 150 MG 12 hr tablet Take 150 mg by mouth daily.    . busPIRone (BUSPAR) 10 MG tablet Take 20 mg by mouth 3 (three) times daily.     . celecoxib (CELEBREX) 100 MG capsule Take 1 capsule (100 mg total) by mouth 2 (two) times daily. 30 capsule 0  . citalopram (CELEXA) 20 MG tablet Take 20 mg by mouth at bedtime.     . clonazePAM (KLONOPIN) 1 MG tablet Take 1 mg by mouth at bedtime.    . conjugated estrogens (PREMARIN) vaginal cream Place 0.5 Applicatorfuls vaginally every Monday, Wednesday, and Friday.    . estradiol (ESTRACE) 1 MG tablet  Take 1 tablet (1 mg total) by mouth daily. 30 tablet 12  . metoCLOPramide (REGLAN) 10 MG tablet Take 1 tablet (10 mg total) by mouth 3 (three) times daily with meals. 90 tablet 1  . mometasone (ELOCON) 0.1 % ointment Apply 1 application topically 2 (two) times a week.    . pantoprazole (PROTONIX) 40 MG tablet Take 1 tablet (40 mg total) by mouth daily. 60 tablet 0  . QUEtiapine (SEROQUEL XR) 300 MG 24 hr tablet Take 300 mg by mouth at bedtime.    Marland Kitchen. QUEtiapine (SEROQUEL) 50 MG tablet Take 50 mg by mouth 2 (two) times daily.     . traMADol (ULTRAM) 50 MG tablet Take 1 tablet (50 mg total) by mouth every 6 (six) hours as needed. 30 tablet 0   No current facility-administered medications on file prior to visit.     Allergies  Allergen Reactions  . Hydrocodone Itching  . Aspirin Other (See Comments)    Reaction:  GI upset   . Morphine And Related Itching, Nausea And Vomiting and Other (See Comments)    Reaction:  Chest pain    Social History   Social History  . Marital status: Married    Spouse name: N/A  . Number of children: N/A  . Years of education: N/A   Occupational History  .  Not on file.   Social History Main Topics  . Smoking status: Current Every Day Smoker    Packs/day: 1.00    Types: Cigarettes  . Smokeless tobacco: Not on file  . Alcohol use No  . Drug use: No  . Sexual activity: Yes    Birth control/ protection: Surgical   Other Topics Concern  . Not on file   Social History Narrative  . No narrative on file    Family History  Problem Relation Age of Onset  . Diabetes Mother   . Diabetes Sister   . Heart disease Neg Hx   . Cancer Father     Throat    The following portions of the patient's history were reviewed and updated as appropriate: allergies, current medications, past family history, past medical history, past social history, past surgical history and problem list.  Review of Systems ROS Review of Systems - General ROS: negative for -  chills, fatigue, fever, hot flashes, night sweats, weight gain or weight loss Psychological ROS: negative for - anxiety, decreased libido, depression, mood swings, physical abuse or sexual abuse Ophthalmic ROS: negative for - blurry vision, eye pain or loss of vision ENT ROS: negative for - headaches, hearing change, visual changes or vocal changes Allergy and Immunology ROS: negative for - hives, itchy/watery eyes or seasonal allergies Hematological and Lymphatic ROS: negative for - bleeding problems, bruising, swollen lymph nodes or weight loss Endocrine ROS: negative for - galactorrhea, hair pattern changes, hot flashes, malaise/lethargy, mood swings, palpitations, polydipsia/polyuria, skin changes, temperature intolerance or unexpected weight changes Breast ROS: negative for - new or changing breast lumps or nipple discharge Respiratory ROS: negative for - cough or shortness of breath Cardiovascular ROS: negative for - chest pain, irregular heartbeat, palpitations or shortness of breath Gastrointestinal ROS: no abdominal pain, change in bowel habits, or black or bloody stools Genito-Urinary ROS: no dysuria, trouble voiding, or hematuria Musculoskeletal ROS: negative for - joint pain or joint stiffness Neurological ROS: negative for - bowel and bladder control changes Dermatological ROS: negative for rash and skin lesion changes   Objective:   There were no vitals taken for this visit. CONSTITUTIONAL: Well-developed, well-nourished female in no acute distress.  PSYCHIATRIC: Normal mood and affect. Normal behavior. Normal judgment and thought content. NEUROLGIC: Alert and oriented to person, place, and time. Normal muscle tone coordination. No cranial nerve deficit noted. HENT:  Normocephalic, atraumatic, External right and left ear normal. Oropharynx is clear and moist EYES: Conjunctivae and EOM are normal. Pupils are equal, round, and reactive to light. No scleral icterus.  NECK: Normal  range of motion, supple, no masses.  Normal thyroid.  SKIN: Skin is warm and dry. No rash noted. Not diaphoretic. No erythema. No pallor. CARDIOVASCULAR: Normal heart rate noted, regular rhythm, no murmur. RESPIRATORY: Clear to auscultation bilaterally. Effort and breath sounds normal, no problems with respiration noted. BREASTS: Symmetric in size. No masses, skin changes, nipple drainage, or lymphadenopathy. ABDOMEN: Soft, normal bowel sounds, no distention noted.  No tenderness, rebound or guarding.  BLADDER: Normal PELVIC:  External Genitalia: Normal  BUS: Normal  Vagina: Normal  Cervix: Normal  Uterus: Normal  Adnexa: Normal  RV: {Blank multiple:19196::"External Exam NormaI","No Rectal Masses","Normal Sphincter tone"}  MUSCULOSKELETAL: Normal range of motion. No tenderness.  No cyanosis, clubbing, or edema.  2+ distal pulses. LYMPHATIC: No Axillary, Supraclavicular, or Inguinal Adenopathy.    Assessment:   Annual gynecologic examination 50 y.o. Contraception: status post hysterectomy bmi-34 Problem List Items Addressed This  Visit    Lichen sclerosus   Surgical menopause   Endometriosis   Insomnia    Other Visit Diagnoses    Well woman exam    -  Primary   Encounter for screening mammogram for breast cancer       Vaginal atrophy          Plan:  Pap: Not needed Mammogram: Ordered Stool Guaiac Testing:  Not Indicated Labs: thru pcp Routine preventative health maintenance measures emphasized: {Blank multiple:19196::"Exercise/Diet/Weight control","Tobacco Warnings","Alcohol/Substance use risks","Stress Management","Peer Pressure Issues","Safe Sex"} *** Return to Clinic - 1 [address] [place], [place]

## 2015-09-29 ENCOUNTER — Encounter: Payer: Self-pay | Admitting: Obstetrics and Gynecology

## 2015-10-14 ENCOUNTER — Encounter: Payer: Self-pay | Admitting: Emergency Medicine

## 2015-10-14 DIAGNOSIS — F17210 Nicotine dependence, cigarettes, uncomplicated: Secondary | ICD-10-CM | POA: Insufficient documentation

## 2015-10-14 DIAGNOSIS — Z79899 Other long term (current) drug therapy: Secondary | ICD-10-CM | POA: Insufficient documentation

## 2015-10-14 DIAGNOSIS — K029 Dental caries, unspecified: Secondary | ICD-10-CM | POA: Insufficient documentation

## 2015-10-14 MED ORDER — LIDOCAINE VISCOUS 2 % MT SOLN
15.0000 mL | Freq: Once | OROMUCOSAL | Status: AC
  Administered 2015-10-14: 15 mL via OROMUCOSAL
  Filled 2015-10-14: qty 15

## 2015-10-14 MED ORDER — OXYCODONE-ACETAMINOPHEN 5-325 MG PO TABS
1.0000 | ORAL_TABLET | Freq: Once | ORAL | Status: AC
  Administered 2015-10-14: 1 via ORAL
  Filled 2015-10-14: qty 1

## 2015-10-14 NOTE — ED Triage Notes (Addendum)
Patient ambulatory to triage with steady gait, without difficulty or distress noted; pt reports since Sunday having left sided dental pain, now radiating into ear & face; took 3ibuprofen and OTC sinus med PTA without relief; st had left lower tooth that broke about 3wks ago

## 2015-10-15 ENCOUNTER — Emergency Department
Admission: EM | Admit: 2015-10-15 | Discharge: 2015-10-15 | Disposition: A | Discharge status: Home / Self Care | Payer: Self-pay | Attending: Emergency Medicine | Admitting: Emergency Medicine

## 2015-10-15 DIAGNOSIS — K0889 Other specified disorders of teeth and supporting structures: Secondary | ICD-10-CM

## 2015-10-15 DIAGNOSIS — K029 Dental caries, unspecified: Secondary | ICD-10-CM

## 2015-10-15 MED ORDER — AMOXICILLIN-POT CLAVULANATE 875-125 MG PO TABS
1.0000 | ORAL_TABLET | Freq: Two times a day (BID) | ORAL | Status: DC
  Administered 2015-10-15: 1 via ORAL
  Filled 2015-10-15: qty 1

## 2015-10-15 MED ORDER — OXYCODONE-ACETAMINOPHEN 5-325 MG PO TABS: 1.0000 | ORAL_TABLET | ORAL | 0 refills | Status: DC | PRN

## 2015-10-15 MED ORDER — AMOXICILLIN-POT CLAVULANATE 875-125 MG PO TABS: 1.0000 | ORAL_TABLET | Freq: Two times a day (BID) | ORAL | 0 refills | Status: AC

## 2015-10-15 NOTE — ED Provider Notes (Signed)
[facility] Emergency Department Provider Note    First MD Initiated Contact with Patient 10/15/15 0207     (approximate)  I have reviewed the triage vital signs and the nursing notes.   HISTORY  Chief Complaint Dental Pain   HPI Brandy Robinson is a 50 y.o. female presents with left lower molar pain 3 days. Patient denies any fever. Patient states that the pain is unrelieved with ibuprofen. States that she broke a tooth at that location the pain approximately 3 weeks ago.   Past Medical History:  Diagnosis Date  . Anxiety   . Bipolar disorder (HCC)   . Depression   . Dyspareunia   . Endometriosis   . GERD (gastroesophageal reflux disease)   . Insomnia   . Lichen   . Surgical menopause   . Vaginal atrophy   . Wears glasses     Patient Active Problem List   Diagnosis Date Noted  . Acute respiratory failure (HCC)   . Perforated viscus 12/30/2014  . Lichen sclerosus 09/24/2014  . Surgical menopause 09/24/2014  . Endometriosis 09/24/2014  . Bipolar 1 disorder (HCC) 09/24/2014  . Insomnia 09/24/2014  . GERD (gastroesophageal reflux disease) 09/24/2014  . History of stomach ulcers 09/24/2014    Past Surgical History:  Procedure Laterality Date  . ABDOMINAL HYSTERECTOMY  1995   [facility]  . ARM WOUND REPAIR / CLOSURE  2013   cellulitis rt arm-i/d  . KNEE BURSECTOMY Left 09/24/2013   Procedure: IRRIGATION AND DEBRIDEMENT OF LEFT KNEE ;  Surgeon: Sheral Apley, MD;  Location: [facility];  Service: Orthopedics;  Laterality: Left;  . LAPAROTOMY N/A 12/30/2014   Procedure: EXPLORATORY LAPAROTOMY-graham patch of peptic ulcer;  Surgeon: Ricarda Frame, MD;  Location: [facility];  Service: General;  Laterality: N/A;    Prior to Admission medications   Medication Sig Start Date End Date Taking? Authorizing Provider  buPROPion (WELLBUTRIN SR) 150 MG 12 hr tablet Take 150 mg by mouth daily.    Historical Provider, MD  busPIRone  (BUSPAR) 10 MG tablet Take 20 mg by mouth 3 (three) times daily.     Historical Provider, MD  celecoxib (CELEBREX) 100 MG capsule Take 1 capsule (100 mg total) by mouth 2 (two) times daily. 01/28/15   Ricarda Frame, MD  citalopram (CELEXA) 20 MG tablet Take 20 mg by mouth at bedtime.     Historical Provider, MD  clonazePAM (KLONOPIN) 1 MG tablet Take 1 mg by mouth at bedtime.    Historical Provider, MD  conjugated estrogens (PREMARIN) vaginal cream Place 0.5 Applicatorfuls vaginally every Monday, Wednesday, and Friday.    Historical Provider, MD  estradiol (ESTRACE) 1 MG tablet Take 1 tablet (1 mg total) by mouth daily. 09/24/14   Prentice Docker Defrancesco, MD  metoCLOPramide (REGLAN) 10 MG tablet Take 1 tablet (10 mg total) by mouth 3 (three) times daily with meals. 11/07/14 11/07/15  Minna Antis, MD  mometasone (ELOCON) 0.1 % ointment Apply 1 application topically 2 (two) times a week.    Historical Provider, MD  pantoprazole (PROTONIX) 40 MG tablet Take 1 tablet (40 mg total) by mouth daily. 05/13/15   Gladis Riffle, MD  QUEtiapine (SEROQUEL XR) 300 MG 24 hr tablet Take 300 mg by mouth at bedtime.    Historical Provider, MD  QUEtiapine (SEROQUEL) 50 MG tablet Take 50 mg by mouth 2 (two) times daily.     Historical Provider, MD  traMADol (ULTRAM) 50 MG tablet Take 1  tablet (50 mg total) by mouth every 6 (six) hours as needed. 01/28/15   Ricarda Frameharles Woodham, MD    Allergies Hydrocodone; Aspirin; and Morphine and related  Family History  Problem Relation Age of Onset  . Diabetes Mother   . Diabetes Sister   . Cancer Father     Throat  . Heart disease Neg Hx     Social History Social History  Substance Use Topics  . Smoking status: Current Every Day Smoker    Packs/day: 1.00    Types: Cigarettes  . Smokeless tobacco: Never Used  . Alcohol use No    Review of Systems Constitutional: No fever/chills Eyes: No visual changes. ENT: No sore throat.Positive for dental  pain Cardiovascular: Denies chest pain. Respiratory: Denies shortness of breath. Gastrointestinal: No abdominal pain.  No nausea, no vomiting.  No diarrhea.  No constipation. Genitourinary: Negative for dysuria. Musculoskeletal: Negative for back pain. Skin: Negative for rash. Neurological: Negative for headaches, focal weakness or numbness.  10-point ROS otherwise negative.  ____________________________________________   PHYSICAL EXAM:  VITAL SIGNS: ED Triage Vitals  Enc Vitals Group     BP 10/14/15 2336 (!) 153/77     Pulse Rate 10/14/15 2336 95     Resp 10/14/15 2336 20     Temp 10/14/15 2336 98.1 F (36.7 C)     Temp Source 10/14/15 2336 Oral     SpO2 10/14/15 2336 95 %     Weight 10/14/15 2334 170 lb (77.1 kg)     Height 10/14/15 2334 5\' 3"  (1.6 m)     Head Circumference --      Peak Flow --      Pain Score 10/14/15 2334 10     Pain Loc --      Pain Edu? --      Excl. in GC? --     Constitutional: Alert and oriented. Well appearing and in no acute distress. Eyes: Conjunctivae are normal. PERRL. EOMI. Head: Atraumatic. Ears:  Healthy appearing ear canals and TMs bilaterally Nose: No congestion/rhinnorhea. Mouth/Throat: Mucous membranes are moist.  Oropharynx non-erythematous.Left posterior molar dental caries with surrounding gum swelling Neck: No stridor.  No meningeal signs.   Cardiovascular: Normal rate, regular rhythm. Good peripheral circulation. Grossly normal heart sounds. Respiratory: Normal respiratory effort.  No retractions. Lungs CTAB. Gastrointestinal: Soft and nontender. No distention.  Musculoskeletal: No lower extremity tenderness nor edema. No gross deformities of extremities. Neurologic:  Normal speech and language. No gross focal neurologic deficits are appreciated.  Skin:  Skin is warm, dry and intact. No rash noted.      Procedures     INITIAL IMPRESSION / ASSESSMENT AND PLAN / ED COURSE  Pertinent labs & imaging results that were  available during my care of the patient were reviewed by me and considered in my medical decision making (see chart for details).  Patient given viscous lidocaine swish and spit one Percocet and Augmentin with improvement of pain.   Clinical Course    ____________________________________________  FINAL CLINICAL IMPRESSION(S) / ED DIAGNOSES  Final diagnoses:  Dental caries  Pain, dental     MEDICATIONS GIVEN DURING THIS VISIT:  Medications  oxyCODONE-acetaminophen (PERCOCET/ROXICET) 5-325 MG per tablet 1 tablet (1 tablet Oral Given 10/14/15 2344)  lidocaine (XYLOCAINE) 2 % viscous mouth solution 15 mL (15 mLs Mouth/Throat Given 10/14/15 2344)     NEW OUTPATIENT MEDICATIONS STARTED DURING THIS VISIT:  New Prescriptions   No medications on file    Modified Medications  No medications on file    Discontinued Medications   No medications on file     Note:  This document was prepared using Dragon voice recognition software and may include unintentional dictation errors.    Darci Current, MD 10/15/15 [phone]

## 2015-10-15 NOTE — ED Notes (Signed)
Patient discharged to home per MD order. Patient in stable condition, and deemed medically cleared by ED provider for discharge. Discharge instructions reviewed with patient/family using "Teach Back"; verbalized understanding of medication education and administration, and information about follow-up care. Denies further concerns. ° °

## 2015-10-19 NOTE — Progress Notes (Deleted)
ANNUAL PREVENTATIVE CARE GYN  ENCOUNTER NOTE  Subjective:       Brandy Robinson is a 50 y.o. No obstetric history on file. female here for a routine annual gynecologic exam.  Current complaints: 1.      Gynecologic History No LMP recorded. Patient has had a hysterectomy. Contraception: status post hysterectomy Last Pap: unsure of last one. Results were: normal Last mammogram: 2014. Results were: normal  Obstetric History OB History  No data available    Past Medical History:  Diagnosis Date  . Anxiety   . Bipolar disorder (HCC)   . Depression   . Dyspareunia   . Endometriosis   . GERD (gastroesophageal reflux disease)   . Insomnia   . Lichen   . Surgical menopause   . Vaginal atrophy   . Wears glasses     Past Surgical History:  Procedure Laterality Date  . ABDOMINAL HYSTERECTOMY  1995   [facility]  . ARM WOUND REPAIR / CLOSURE  2013   cellulitis rt arm-i/d  . KNEE BURSECTOMY Left 09/24/2013   Procedure: IRRIGATION AND DEBRIDEMENT OF LEFT KNEE ;  Surgeon: Sheral Apley, MD;  Location: [facility];  Service: Orthopedics;  Laterality: Left;  . LAPAROTOMY N/A 12/30/2014   Procedure: EXPLORATORY LAPAROTOMY-graham patch of peptic ulcer;  Surgeon: Ricarda Frame, MD;  Location: [facility];  Service: General;  Laterality: N/A;    Current Outpatient Prescriptions on File Prior to Visit  Medication Sig Dispense Refill  . amoxicillin-clavulanate (AUGMENTIN) 875-125 MG tablet Take 1 tablet by mouth 2 (two) times daily. 20 tablet 0  . buPROPion (WELLBUTRIN SR) 150 MG 12 hr tablet Take 150 mg by mouth daily.    . busPIRone (BUSPAR) 10 MG tablet Take 20 mg by mouth 3 (three) times daily.     . celecoxib (CELEBREX) 100 MG capsule Take 1 capsule (100 mg total) by mouth 2 (two) times daily. 30 capsule 0  . citalopram (CELEXA) 20 MG tablet Take 20 mg by mouth at bedtime.     . clonazePAM (KLONOPIN) 1 MG tablet Take 1 mg by mouth at bedtime.    . conjugated estrogens  (PREMARIN) vaginal cream Place 0.5 Applicatorfuls vaginally every Monday, Wednesday, and Friday.    . estradiol (ESTRACE) 1 MG tablet Take 1 tablet (1 mg total) by mouth daily. 30 tablet 12  . metoCLOPramide (REGLAN) 10 MG tablet Take 1 tablet (10 mg total) by mouth 3 (three) times daily with meals. 90 tablet 1  . mometasone (ELOCON) 0.1 % ointment Apply 1 application topically 2 (two) times a week.    Marland Kitchen oxyCODONE-acetaminophen (ROXICET) 5-325 MG tablet Take 1 tablet by mouth every 4 (four) hours as needed for severe pain. 12 tablet 0  . pantoprazole (PROTONIX) 40 MG tablet Take 1 tablet (40 mg total) by mouth daily. 60 tablet 0  . QUEtiapine (SEROQUEL XR) 300 MG 24 hr tablet Take 300 mg by mouth at bedtime.    Marland Kitchen QUEtiapine (SEROQUEL) 50 MG tablet Take 50 mg by mouth 2 (two) times daily.     . traMADol (ULTRAM) 50 MG tablet Take 1 tablet (50 mg total) by mouth every 6 (six) hours as needed. 30 tablet 0   No current facility-administered medications on file prior to visit.     Allergies  Allergen Reactions  . Hydrocodone Itching  . Aspirin Other (See Comments)    Reaction:  GI upset   . Morphine And Related Itching, Nausea And Vomiting and Other (See  Comments)    Reaction:  Chest pain    Social History   Social History  . Marital status: Married    Spouse name: N/A  . Number of children: N/A  . Years of education: N/A   Occupational History  . Not on file.   Social History Main Topics  . Smoking status: Current Every Day Smoker    Packs/day: 1.00    Types: Cigarettes  . Smokeless tobacco: Never Used  . Alcohol use No  . Drug use: No  . Sexual activity: Yes    Birth control/ protection: Surgical   Other Topics Concern  . Not on file   Social History Narrative  . No narrative on file    Family History  Problem Relation Age of Onset  . Diabetes Mother   . Diabetes Sister   . Cancer Father     Throat  . Heart disease Neg Hx     The following portions of the  patient's history were reviewed and updated as appropriate: allergies, current medications, past family history, past medical history, past social history, past surgical history and problem list.  Review of Systems ROS Review of Systems - General ROS: negative for - chills, fatigue, fever, hot flashes, night sweats, weight gain or weight loss Psychological ROS: negative for - anxiety, decreased libido, depression, mood swings, physical abuse or sexual abuse Ophthalmic ROS: negative for - blurry vision, eye pain or loss of vision ENT ROS: negative for - headaches, hearing change, visual changes or vocal changes Allergy and Immunology ROS: negative for - hives, itchy/watery eyes or seasonal allergies Hematological and Lymphatic ROS: negative for - bleeding problems, bruising, swollen lymph nodes or weight loss Endocrine ROS: negative for - galactorrhea, hair pattern changes, hot flashes, malaise/lethargy, mood swings, palpitations, polydipsia/polyuria, skin changes, temperature intolerance or unexpected weight changes Breast ROS: negative for - new or changing breast lumps or nipple discharge Respiratory ROS: negative for - cough or shortness of breath Cardiovascular ROS: negative for - chest pain, irregular heartbeat, palpitations or shortness of breath Gastrointestinal ROS: no abdominal pain, change in bowel habits, or black or bloody stools Genito-Urinary ROS: no dysuria, trouble voiding, or hematuria Musculoskeletal ROS: negative for - joint pain or joint stiffness Neurological ROS: negative for - bowel and bladder control changes Dermatological ROS: negative for rash and skin lesion changes   Objective:   There were no vitals taken for this visit. CONSTITUTIONAL: Well-developed, well-nourished female in no acute distress.  PSYCHIATRIC: Normal mood and affect. Normal behavior. Normal judgment and thought content. NEUROLGIC: Alert and oriented to person, place, and time. Normal muscle tone  coordination. No cranial nerve deficit noted. HENT:  Normocephalic, atraumatic, External right and left ear normal. Oropharynx is clear and moist EYES: Conjunctivae and EOM are normal. Pupils are equal, round, and reactive to light. No scleral icterus.  NECK: Normal range of motion, supple, no masses.  Normal thyroid.  SKIN: Skin is warm and dry. No rash noted. Not diaphoretic. No erythema. No pallor. CARDIOVASCULAR: Normal heart rate noted, regular rhythm, no murmur. RESPIRATORY: Clear to auscultation bilaterally. Effort and breath sounds normal, no problems with respiration noted. BREASTS: Symmetric in size. No masses, skin changes, nipple drainage, or lymphadenopathy. ABDOMEN: Soft, normal bowel sounds, no distention noted.  No tenderness, rebound or guarding.  BLADDER: Normal PELVIC:  External Genitalia: Normal  BUS: Normal  Vagina: Normal  Cervix: Normal  Uterus: Normal  Adnexa: Normal  RV: {Blank multiple:19196::"External Exam NormaI","No Rectal Masses","Normal Sphincter tone"}  MUSCULOSKELETAL: Normal range of motion. No tenderness.  No cyanosis, clubbing, or edema.  2+ distal pulses. LYMPHATIC: No Axillary, Supraclavicular, or Inguinal Adenopathy.    Assessment:   Annual gynecologic examination 50 y.o. Contraception: status post hysterectomy bmi- 30 Problem List Items Addressed This Visit    Lichen sclerosus   Surgical menopause   Endometriosis    Other Visit Diagnoses    Well woman exam    -  Primary   Vaginal atrophy       Encounter for screening mammogram for breast cancer          Plan:  Pap: Pap Co Test Mammogram: Ordered Stool Guaiac Testing:  Not Indicated Labs: thru [facility] Routine preventative health maintenance measures emphasized: {Blank multiple:19196::"Exercise/Diet/Weight control","Tobacco Warnings","Alcohol/Substance use risks","Stress Management","Peer Pressure Issues","Safe Sex"} *** Return to Clinic - 1 Year   [place], [place]

## 2015-10-21 ENCOUNTER — Encounter: Payer: Self-pay | Admitting: Obstetrics and Gynecology

## 2015-12-02 ENCOUNTER — Encounter: Payer: Self-pay | Admitting: Emergency Medicine

## 2015-12-02 ENCOUNTER — Emergency Department
Admission: EM | Admit: 2015-12-02 | Discharge: 2015-12-02 | Disposition: A | Discharge status: Home / Self Care | Payer: Self-pay | Attending: Emergency Medicine | Admitting: Emergency Medicine

## 2015-12-02 DIAGNOSIS — Z791 Long term (current) use of non-steroidal anti-inflammatories (NSAID): Secondary | ICD-10-CM | POA: Insufficient documentation

## 2015-12-02 DIAGNOSIS — F17210 Nicotine dependence, cigarettes, uncomplicated: Secondary | ICD-10-CM | POA: Insufficient documentation

## 2015-12-02 DIAGNOSIS — K047 Periapical abscess without sinus: Secondary | ICD-10-CM | POA: Insufficient documentation

## 2015-12-02 MED ORDER — TRAMADOL HCL 50 MG PO TABS: 50.0000 mg | ORAL_TABLET | Freq: Four times a day (QID) | ORAL | 0 refills | Status: DC | PRN

## 2015-12-02 MED ORDER — TRAMADOL HCL 50 MG PO TABS
50.0000 mg | ORAL_TABLET | Freq: Once | ORAL | Status: AC
  Administered 2015-12-02: 50 mg via ORAL
  Filled 2015-12-02: qty 1

## 2015-12-02 MED ORDER — PENICILLIN V POTASSIUM 500 MG PO TABS: 500.0000 mg | ORAL_TABLET | Freq: Three times a day (TID) | ORAL | 0 refills | Status: DC

## 2015-12-02 NOTE — ED Provider Notes (Signed)
[name]. [facility] Emergency Department Provider Note ____________________________________________  Time seen: Approximately 6:28 PM  I have reviewed the triage vital signs and the nursing notes.   HISTORY  Chief Complaint No chief complaint on file.   HPI Brandy Robinson is a 50 y.o. female who presents to the emergency department for evaluation of dental pain. Pain has been present off-and-on for the past month or so but has worsened over the past 24 hours. No relief with Tylenol. She denies fever.  Past Medical History:  Diagnosis Date  . Anxiety   . Bipolar disorder (HCC)   . Depression   . Dyspareunia   . Endometriosis   . GERD (gastroesophageal reflux disease)   . Insomnia   . Lichen   . Surgical menopause   . Vaginal atrophy   . Wears glasses     Patient Active Problem List   Diagnosis Date Noted  . Acute respiratory failure (HCC)   . Perforated viscus 12/30/2014  . Lichen sclerosus 09/24/2014  . Surgical menopause 09/24/2014  . Endometriosis 09/24/2014  . Bipolar 1 disorder (HCC) 09/24/2014  . Insomnia 09/24/2014  . GERD (gastroesophageal reflux disease) 09/24/2014  . History of stomach ulcers 09/24/2014    Past Surgical History:  Procedure Laterality Date  . ABDOMINAL HYSTERECTOMY  1995   tah-bso  . ARM WOUND REPAIR / CLOSURE  2013   cellulitis rt arm-i/d  . KNEE BURSECTOMY Left 09/24/2013   Procedure: IRRIGATION AND DEBRIDEMENT OF LEFT KNEE ;  Surgeon: Sheral Apleyimothy D Murphy, MD;  Location: [facility];  Service: Orthopedics;  Laterality: Left;  . LAPAROTOMY N/A 12/30/2014   Procedure: EXPLORATORY LAPAROTOMY-graham patch of peptic ulcer;  Surgeon: Ricarda Frameharles Woodham, MD;  Location: [facility] ORS;  Service: General;  Laterality: N/A;    Prior to Admission medications   Medication Sig Start Date End Date Taking? Authorizing Provider  buPROPion (WELLBUTRIN SR) 150 MG 12 hr tablet Take 150 mg by mouth daily.    Historical Provider, MD   busPIRone (BUSPAR) 10 MG tablet Take 20 mg by mouth 3 (three) times daily.     Historical Provider, MD  celecoxib (CELEBREX) 100 MG capsule Take 1 capsule (100 mg total) by mouth 2 (two) times daily. 01/28/15   Ricarda Frameharles Woodham, MD  citalopram (CELEXA) 20 MG tablet Take 20 mg by mouth at bedtime.     Historical Provider, MD  clonazePAM (KLONOPIN) 1 MG tablet Take 1 mg by mouth at bedtime.    Historical Provider, MD  conjugated estrogens (PREMARIN) vaginal cream Place 0.5 Applicatorfuls vaginally every Monday, Wednesday, and Friday.    Historical Provider, MD  estradiol (ESTRACE) 1 MG tablet Take 1 tablet (1 mg total) by mouth daily. 09/24/14   Prentice DockerMartin A Defrancesco, MD  metoCLOPramide (REGLAN) 10 MG tablet Take 1 tablet (10 mg total) by mouth 3 (three) times daily with meals. 11/07/14 11/07/15  Minna AntisKevin Paduchowski, MD  mometasone (ELOCON) 0.1 % ointment Apply 1 application topically 2 (two) times a week.    Historical Provider, MD  oxyCODONE-acetaminophen (ROXICET) 5-325 MG tablet Take 1 tablet by mouth every 4 (four) hours as needed for severe pain. 10/15/15   Darci Currentandolph N Brown, MD  pantoprazole (PROTONIX) 40 MG tablet Take 1 tablet (40 mg total) by mouth daily. 05/13/15   Gladis Riffleatherine L Loflin, MD  penicillin v potassium (VEETID) 500 MG tablet Take 1 tablet (500 mg total) by mouth 3 (three) times daily. 12/02/15   Chinita Pesterari B Alezandra Egli, FNP  QUEtiapine (SEROQUEL XR)  300 MG 24 hr tablet Take 300 mg by mouth at bedtime.    Historical Provider, MD  QUEtiapine (SEROQUEL) 50 MG tablet Take 50 mg by mouth 2 (two) times daily.     Historical Provider, MD  traMADol (ULTRAM) 50 MG tablet Take 1 tablet (50 mg total) by mouth every 6 (six) hours as needed. 12/02/15   Chinita Pesterari B Braeleigh Pyper, FNP    Allergies Hydrocodone; Aspirin; and Morphine and related  Family History  Problem Relation Age of Onset  . Diabetes Mother   . Diabetes Sister   . Cancer Father     Throat  . Heart disease Neg Hx     Social History Social  History  Substance Use Topics  . Smoking status: Current Every Day Smoker    Packs/day: 1.00    Types: Cigarettes  . Smokeless tobacco: Never Used  . Alcohol use No    Review of Systems Constitutional: Negative for fever ENT: Positive for dental pain. Musculoskeletal: Negative for jaw pain/trismus. Skin: Positive for mild swelling of the left lower jaw without erythema. ____________________________________________   PHYSICAL EXAM:  VITAL SIGNS: ED Triage Vitals  Enc Vitals Group     BP 12/02/15 1813 (!) 161/80     Pulse Rate 12/02/15 1813 98     Resp 12/02/15 1813 18     Temp 12/02/15 1813 97.9 F (36.6 C)     Temp Source 12/02/15 1813 Oral     SpO2 12/02/15 1813 99 %     Weight 12/02/15 1813 167 lb (75.8 kg)     Height 12/02/15 1813 5\' 3"  (1.6 m)     Head Circumference --      Peak Flow --      Pain Score 12/02/15 1819 7     Pain Loc --      Pain Edu? --      Excl. in GC? --     Constitutional: Alert and oriented. Well appearing and in no acute distress. Eyes: Conjunctivae are normal.EOMI. Mouth/Throat: Mucous membranes are moist. Oropharynx non-erythematous. Periodontal Exam    Hematological/Lymphatic/Immunilogical: No cervical lymphadenopathy. Respiratory: Normal respiratory effort.  Musculoskeletal: Full ROM x 4 extremities. Neurologic:  Normal speech and language. No gross focal neurologic deficits are appreciated. Speech is normal. No gait instability. Skin:  Mild edema over the left mandible without erythema. Psychiatric: Mood and affect are normal. Speech and behavior are normal.  ____________________________________________   LABS (all labs ordered are listed, but only abnormal results are displayed)  Labs Reviewed - No data to display ____________________________________________   RADIOLOGY  Not indicated ____________________________________________   PROCEDURES  Procedure(s) performed: None  Critical Care performed:  No  ____________________________________________   INITIAL IMPRESSION / ASSESSMENT AND PLAN / ED COURSE  Pertinent labs & imaging results that were available during my care of the patient were reviewed by me and considered in my medical decision making (see chart for details). Patient will be prescribed penicillin V and tramadol. Patient was advised to see the dentist within 14 days. Also advised to take the antibiotic until finished. Instructed to return to the ER for symptoms that change or worsen if unable to schedule an appointment. ____________________________________________   FINAL CLINICAL IMPRESSION(S) / ED DIAGNOSES  Final diagnoses:  Dental abscess    Note:  This document was prepared using Dragon voice recognition software and may include unintentional dictation errors.    Chinita PesterCari B Preciliano Castell, FNP 12/02/15 2240    Sharman CheekPhillip Stafford, MD 12/02/15 [phone]

## 2015-12-02 NOTE — Discharge Instructions (Signed)
Please call and schedule a dental appointment as soon as possible. You will need to be seen within the next 14 days. Return to the emergency department for symptoms that change or worsen if you're unable to schedule an appointment.  OPTIONS FOR DENTAL FOLLOW UP CARE  [place] Department of Health and Human Services - Local Safety Net Dental Clinics http://www.ncdhhs.gov/dph/oralhealth/services/safetynetclinics.htm   [facility] ([phone])  [facility] ([phone])  [facility] ([phone])  [facility] ([phone])  [facility] ([phone]) This clinic caters to the indigent population and is on a lottery system. Location: [facility], [address], [place] Clinic Hours: Wednesdays from 6pm - 9pm, patients seen by a lottery system. For dates, call or go to [email or web address].[facility].edu/[facility]/[email or web address]-[facility] Services: Cleanings, fillings and simple extractions. Payment Options: DENTAL WORK IS FREE OF CHARGE. Bring proof of income or support. Best way to get seen: Arrive at 5:15 pm - this is a lottery, NOT first come/first serve, so arriving earlier will not increase your chances of being seen.     [facility] [phone] Select option 1 for emergencies   Location: [facility], [address], [place] Clinic Hours: No walk-ins accepted - call the day before to schedule an appointment. Check in times are 9:30 am and 1:30 pm. Services: Simple extractions, temporary fillings, pulpectomy/pulp debridement, uncomplicated abscess drainage. Payment Options: PAYMENT IS DUE AT THE TIME OF SERVICE.  Fee is usually $100-200, additional surgical procedures (e.g. abscess drainage) may be extra. Cash, checks, Visa/MasterCard accepted.  Can file Medicaid if patient is covered for dental - patient should call case worker to check. No  discount for [facility] patients. Best way to get seen: MUST call the day before and get onto the schedule. Can usually be seen the next 1-2 days. No walk-ins accepted.     [facility] [phone]   Location: [facility], [address], [place] Clinic Hours: M, W, Th, F 8am or 1:30pm, Tues 9a or 1:30 - first come/first served. Services: Simple extractions, temporary fillings, uncomplicated abscess drainage.  You do not need to be an [place] resident. Payment Options: PAYMENT IS DUE AT THE TIME OF SERVICE. Dental insurance, otherwise sliding scale - bring proof of income or support. Depending on income and treatment needed, cost is usually $50-200. Best way to get seen: Arrive early as it is first come/first served.     [facility] [phone]   Location: [address] Clinic Hours: Mon-Thu 8a-5p Services: Most basic dental services including extractions and fillings. Payment Options: PAYMENT IS DUE AT THE TIME OF SERVICE. Sliding scale, up to 50% off - bring proof if income or support. Medicaid with dental option accepted. Best way to get seen: Call to schedule an appointment, can usually be seen within 2 weeks OR they will try to see walk-ins - show up at 8a or 2p (you may have to wait).     [facility] [phone] [place] RESIDENTS ONLY   Location: [facility], [address], [place], [place] [address] Clinic Hours: By appointment only. Monday - Thursday 8am-5pm, Friday 8am-12pm Services: Cleanings, fillings, extractions. Payment Options: PAYMENT IS DUE AT THE TIME OF SERVICE. Cash, Visa or MasterCard. Sliding scale - $30 minimum per service. Best way to get seen: Come in to office, complete packet and make an appointment -   need proof of income or support monies for each household member and proof of [place]  residence. Usually takes about a month to get in.     [facility] [phone]   Location: [address] [place] Clinic Hours: Walk-in Urgent Care Dental Services are offered Monday-Friday mornings only. The numbers of emergencies accepted daily is limited to the number of providers available. Maximum 15 - Mondays, Wednesdays & Thursdays Maximum 10 - Tuesdays & Fridays Services: You do not need to be a [place] resident to be seen for a dental emergency. Emergencies are defined as pain, swelling, abnormal bleeding, or dental trauma. Walkins will receive x-rays if needed. NOTE: Dental cleaning is not an emergency. Payment Options: PAYMENT IS DUE AT THE TIME OF SERVICE. Minimum co-pay is $40.00 for uninsured patients. Minimum co-pay is $3.00 for Medicaid with dental coverage. Dental Insurance is accepted and must be presented at time of visit. Medicare does not cover dental. Forms of payment: Cash, credit card, checks. Best way to get seen: If not previously registered with the clinic, walk-in dental registration begins at 7:15 am and is on a first come/first serve basis. If previously registered with the clinic, call to make an appointment.     [facility] [phone] [place] RESIDENTS ONLY   Location: [address], [place], [place] Clinic Hours: Mon-Thu 10a-2p Services: Extractions only! Payment Options: FREE (donations accepted) - bring proof of income or support Best way to get seen: Call and schedule an appointment OR come at 8am on the 1st Monday of every month (except for holidays) when it is first come/first served.     [facility] [phone]   Location: [address], [place] Clinic Hours: Friday mornings Services, Payment Options, Best way to get seen: Call for info  

## 2015-12-02 NOTE — ED Triage Notes (Signed)
Pt to ED with lower right side dental pain.

## 2016-01-01 ENCOUNTER — Other Ambulatory Visit: Payer: Self-pay | Admitting: Obstetrics and Gynecology

## 2016-01-06 ENCOUNTER — Encounter: Payer: Self-pay | Admitting: Obstetrics and Gynecology

## 2016-01-06 ENCOUNTER — Telehealth: Payer: Self-pay | Admitting: Obstetrics and Gynecology

## 2016-01-06 MED ORDER — ESTRADIOL 1 MG PO TABS: 1.0000 mg | ORAL_TABLET | Freq: Every day | ORAL | 0 refills | Status: DC

## 2016-01-06 NOTE — Telephone Encounter (Signed)
Pt called again and asked for her estrdadiol to be refilled. She cancelled her appt for today and moved it to 1/16.

## 2016-01-06 NOTE — Telephone Encounter (Signed)
Pt aware med erx. Pt aware if she does not make ae in 01/2016 she will not be able to get another refill. Pt voices understanding.

## 2016-02-01 ENCOUNTER — Telehealth: Payer: Self-pay | Admitting: Obstetrics and Gynecology

## 2016-02-01 MED ORDER — ESTRADIOL 1 MG PO TABS: 1.0000 mg | ORAL_TABLET | Freq: Every day | ORAL | 0 refills | Status: DC

## 2016-02-01 NOTE — Telephone Encounter (Signed)
Pt aware med faxed to medication mgmt.

## 2016-02-01 NOTE — Telephone Encounter (Signed)
PT WILL NEED A REFILL ON THE ESTRIDOL, HER APPT WAS SCHEDULED FOR TOMORROW FOR F/U ON IT BUT DR DE OUT OF OFFICE, HER APPT IS NOW 02/17/16 AT 11:15, CAN WE GIVE HER A REFILL TO LAST HER UNTIL THEN

## 2016-02-02 ENCOUNTER — Encounter: Payer: Self-pay | Admitting: Obstetrics and Gynecology

## 2016-02-17 ENCOUNTER — Encounter: Payer: Self-pay | Admitting: Obstetrics and Gynecology

## 2016-03-03 ENCOUNTER — Encounter: Payer: Self-pay | Admitting: Obstetrics and Gynecology

## 2016-03-03 ENCOUNTER — Ambulatory Visit (INDEPENDENT_AMBULATORY_CARE_PROVIDER_SITE_OTHER): Payer: Self-pay | Admitting: Obstetrics and Gynecology

## 2016-03-03 VITALS — BP 91/64 | HR 125 | Ht 63.0 in | Wt 183.6 lb

## 2016-03-03 DIAGNOSIS — N941 Unspecified dyspareunia: Secondary | ICD-10-CM

## 2016-03-03 DIAGNOSIS — N952 Postmenopausal atrophic vaginitis: Secondary | ICD-10-CM

## 2016-03-03 DIAGNOSIS — L900 Lichen sclerosus et atrophicus: Secondary | ICD-10-CM

## 2016-03-03 DIAGNOSIS — E8940 Asymptomatic postprocedural ovarian failure: Secondary | ICD-10-CM

## 2016-03-03 MED ORDER — ESTRADIOL 1 MG PO TABS: 1.5000 mg | ORAL_TABLET | Freq: Every day | ORAL | 11 refills | Status: DC

## 2016-03-03 MED ORDER — ESTROGENS, CONJUGATED 0.625 MG/GM VA CREA: 0.5000 | TOPICAL_CREAM | VAGINAL | 4 refills | Status: DC

## 2016-03-03 MED ORDER — MOMETASONE FUROATE 0.1 % EX OINT: 1.0000 "application " | TOPICAL_OINTMENT | CUTANEOUS | 3 refills | Status: DC

## 2016-03-03 NOTE — Patient Instructions (Signed)
1. Premarin cream is refilled; continue using twice a week intravaginal 2. Estradiol tablets are increased to 1.5 mg a day for ongoing vasomotor symptoms incompletely relieved. 3. Mometasone furoate ointment is prescribed and to be continued twice weekly as previously noted 4. Return in 3 months for annual exam. Annual exam was due in September 2017.

## 2016-03-03 NOTE — Progress Notes (Signed)
Chief complaint: 1. Medication management 2. Lichen sclerosus 3. Vaginal atrophy 4. Surgical menopause  Patient presents for follow-up on medications.  Mometasone furoate is being used for lichen sclerosus, eating applied topically to the vulva twice a week; patient is asymptomatic at this time.  Premarin cream intravaginal is being used twice weekly for vaginal atrophy and dyspareunia; painful intercourse has resolved.  Patient is on estradiol 1 mg a day for surgical menopause; she is still experiencing some hot flashes and night sweats would like medication dosage adjustment. Due to cost of Estratest, will instead prescribe estradiol 1.5 mg daily.  Past history, past surgical history, problem was, medications, and allergies are reviewed  OBJECTIVE: BP 91/64   Pulse (!) 125   Ht 5\' 3"  (1.6 m)   Wt 183 lb 9.6 oz (83.3 kg)   BMI 32.52 kg/m  Pleasant female in no acute distress. Alert and oriented. Abdomen: Soft and nontender without organomegaly Pelvic exam: External genitalia-normal; introitus narrowing resolved BUS-normal Vagina-moderate atrophy process; no significant discharge Cervix-surgically absent Uterus-surgically absent Bimanual-without palpable masses; slight tenderness 1/4 in pelvis without palpable mass RV-without external abnormality  ASSESSMENT: 1. History of dyspareunia, resolved with vaginal estrogen therapy 2. Vaginal atrophy, persistent, stable 3. Lichen sclerosus, asymptomatic, stable 4. Vasomotor symptoms, undergoing despite estradiol 1 mg a  PLAN: 1. Continue with Premarin cream intravaginal twice a week 2. Increase estradiol to 1.5 mg a day 3. Continue with mometasone furoate twice a week 4. Return in 3 months for annual exam and follow-up  A total of 15 minutes were spent face-to-face with the patient during this encounter and over half of that time dealt with counseling and coordination of care.   Herold HarmsMartin A Chavis Tessler, MD  Note: This  dictation was prepared with Dragon dictation along with smaller phrase technology. Any transcriptional errors that result from this process are unintentional. .mad

## 2016-05-09 ENCOUNTER — Telehealth: Payer: Self-pay | Admitting: Pharmacist

## 2016-05-09 NOTE — Telephone Encounter (Signed)
05/09/2016 Patient eligible with [facility] till April 2019.

## 2016-05-09 NOTE — Telephone Encounter (Signed)
05/09/2016 Patient elig with [facility] till April 2019.

## 2016-05-27 NOTE — Progress Notes (Deleted)
Patient ID: Brandy Robinson, female   DOB: [birth year], 51 y.o.   MRN: [id number] ANNUAL PREVENTATIVE CARE GYN  ENCOUNTER NOTE  Subjective:       Brandy Robinson is a 51 y.o. No obstetric history on file. female here for a routine annual gynecologic exam.  Current complaints:  Allscripts summary:the patient is a 51 year old white female, status post TAH/BSO for endometriosis, on estradiol 0.5 mg daily.  4.  Surgical menopause with suboptimal control of vasomotor symptoms, also using Premarin cream intravaginal twice a week for vaginal atrophy symptoms, suboptimal treatment, presents for annual evaluation. Patient also has history of lichen sclerosis which is stable with Temovate ointment use twice weekly.    Gynecologic History No LMP recorded. Patient has had a hysterectomy.TAH/BSO Contraception: status post hysterectomyTAH/BSO Last Pap: unsure. Results were: normal Last mammogram: 2014 Results were: normal History of endometriosis Lichen sclerosis  Obstetric History OB History  No data available    Past Medical History:  Diagnosis Date  . Anxiety   . Bipolar disorder (HCC)   . Depression   . Disc disorder   . Dyspareunia   . Endometriosis   . GERD (gastroesophageal reflux disease)   . Insomnia   . Lichen   . Surgical menopause   . Vaginal atrophy   . Wears glasses     Past Surgical History:  Procedure Laterality Date  . ABDOMINAL HYSTERECTOMY  1995   tah-bso  . ARM WOUND REPAIR / CLOSURE  2013   cellulitis rt arm-i/d  . KNEE BURSECTOMY Left 09/24/2013   Procedure: IRRIGATION AND DEBRIDEMENT OF LEFT KNEE ;  Surgeon: Sheral Apleyimothy D Murphy, MD;  Location: [facility];  Service: Orthopedics;  Laterality: Left;  . LAPAROTOMY N/A 12/30/2014   Procedure: EXPLORATORY LAPAROTOMY-graham patch of peptic ulcer;  Surgeon: Ricarda Frameharles Woodham, MD;  Location: [facility];  Service: General;  Laterality: N/A;    Current Outpatient Prescriptions on File Prior to Visit  Medication  Sig Dispense Refill  . buPROPion (WELLBUTRIN SR) 150 MG 12 hr tablet Take 150 mg by mouth daily.    . busPIRone (BUSPAR) 10 MG tablet Take 20 mg by mouth 3 (three) times daily.     . celecoxib (CELEBREX) 100 MG capsule Take 1 capsule (100 mg total) by mouth 2 (two) times daily. 30 capsule 0  . citalopram (CELEXA) 20 MG tablet Take 20 mg by mouth at bedtime.     . clonazePAM (KLONOPIN) 1 MG tablet Take 1 mg by mouth at bedtime.    . conjugated estrogens (PREMARIN) vaginal cream Place 0.5 Applicatorfuls vaginally every Monday, Wednesday, and Friday. 42.5 g 4  . cyclobenzaprine (FLEXERIL) 10 MG tablet Take 10 mg by mouth 3 (three) times daily as needed for muscle spasms.    Marland Kitchen. estradiol (ESTRACE) 1 MG tablet Take 1.5 tablets (1.5 mg total) by mouth daily. 45 tablet 11  . mometasone (ELOCON) 0.1 % ointment Apply 1 application topically 2 (two) times a week. 45 g 3  . pantoprazole (PROTONIX) 40 MG tablet Take 1 tablet (40 mg total) by mouth daily. 60 tablet 0  . pravastatin (PRAVACHOL) 40 MG tablet Take 40 mg by mouth daily.    . QUEtiapine (SEROQUEL XR) 300 MG 24 hr tablet Take 300 mg by mouth at bedtime.     No current facility-administered medications on file prior to visit.     Allergies  Allergen Reactions  . Hydrocodone Itching  . Aspirin Other (See Comments)    Reaction:  GI  upset   . Morphine And Related Itching, Nausea And Vomiting and Other (See Comments)    Reaction:  Chest pain    Social History   Social History  . Marital status: Married    Spouse name: N/A  . Number of children: N/A  . Years of education: N/A   Occupational History  . Not on file.   Social History Main Topics  . Smoking status: Current Every Day Smoker    Packs/day: 1.00    Types: Cigarettes  . Smokeless tobacco: Never Used  . Alcohol use No  . Drug use: No  . Sexual activity: Yes    Birth control/ protection: Surgical   Other Topics Concern  . Not on file   Social History Narrative  . No  narrative on file    Family History  Problem Relation Age of Onset  . Diabetes Mother   . Diabetes Sister   . Cancer Father        Throat  . Heart disease Neg Hx     The following portions of the patient's history were reviewed and updated as appropriate: allergies, current medications, past family history, past medical history, past social history, past surgical history and problem list.  Review of Systems ROS   Objective:   There were no vitals taken for this visit. CONSTITUTIONAL: Well-developed, well-nourished female in no acute distress.  PSYCHIATRIC: Normal mood and affect. Normal behavior. Normal judgment and thought content. NEUROLGIC: Alert and oriented to person, place, and time. Normal muscle tone coordination. No cranial nerve deficit noted. HENT:  Normocephalic, atraumatic, External right and left ear normal. Oropharynx is clear and moist EYES: Conjunctivae and EOM are normal. Pupils are equal, round, and reactive to light. No scleral icterus.  NECK: Normal range of motion, supple, no masses.  Normal thyroid.  SKIN: Skin is warm and dry. No rash noted. Not diaphoretic. No erythema. No pallor. CARDIOVASCULAR: Normal heart rate noted, regular rhythm, no murmur. RESPIRATORY: Clear to auscultation bilaterally. Effort and breath sounds normal, no problems with respiration noted. BREASTS: Symmetric in size. No masses, skin changes, nipple drainage, or lymphadenopathy. ABDOMEN: Soft, normal bowel sounds, no distention noted.  No tenderness, rebound or guarding. Slightly protuberant; midline scars well-healed without evidence of hernia BLADDER: Normal PELVIC:  External Genitalia: Normal; introitus, slightly narrowed  BUS: Normal  Vagina: mild atrophic changes  Cervix: surgically absent  Uterus: surgically absent  Adnexa: nonpalpable, nontender  RV: External Exam NormaI, No Rectal Masses and Normal Sphincter tone  MUSCULOSKELETAL: Normal range of motion. No tenderness.  No  cyanosis, clubbing, or edema.  2+ distal pulses. LYMPHATIC: No Axillary, Supraclavicular, or Inguinal Adenopathy.    Assessment:   Annual gynecologic examination 51 y.o. Contraception: status post hysterectomy Normal BMI Lichen sclerosis, stable. Vasomotor symptoms suboptimally controlled with 0.5 mg estradiol Vaginal atrophy symptoms suboptimally controlled with twice weekly Premarin cream  Plan:  Pap: not needed Mammogram: Ordered Stool Guaiac Testing:  Not Indicated Labs: Leonette Most drew  Routine preventative health maintenance measures emphasized: Exercise/Diet/Weight control, Tobacco Warnings and Alcohol/Substance use risks Estradiol is increased to 1 mg a day. Premarin vaginal cream is increased to 3 times weekly. Temovate ointment is to be continued with twice weekly application Return to Clinic - [address] [place], [place]

## 2016-06-01 ENCOUNTER — Encounter: Payer: Self-pay | Admitting: Obstetrics and Gynecology

## 2016-10-17 ENCOUNTER — Emergency Department: Payer: Self-pay

## 2016-10-17 ENCOUNTER — Encounter: Payer: Self-pay | Admitting: Radiology

## 2016-10-17 ENCOUNTER — Emergency Department
Admission: EM | Admit: 2016-10-17 | Discharge: 2016-10-17 | Disposition: A | Discharge status: Home / Self Care | Payer: Self-pay | Attending: Emergency Medicine | Admitting: Emergency Medicine

## 2016-10-17 DIAGNOSIS — F17210 Nicotine dependence, cigarettes, uncomplicated: Secondary | ICD-10-CM | POA: Insufficient documentation

## 2016-10-17 DIAGNOSIS — R112 Nausea with vomiting, unspecified: Secondary | ICD-10-CM | POA: Insufficient documentation

## 2016-10-17 DIAGNOSIS — Z79899 Other long term (current) drug therapy: Secondary | ICD-10-CM | POA: Insufficient documentation

## 2016-10-17 DIAGNOSIS — R079 Chest pain, unspecified: Secondary | ICD-10-CM | POA: Insufficient documentation

## 2016-10-17 LAB — HEPATIC FUNCTION PANEL
ALBUMIN: 3.4 g/dL — AB (ref 3.5–5.0)
ALK PHOS: 118 U/L (ref 38–126)
ALT: 17 U/L (ref 14–54)
AST: 31 U/L (ref 15–41)
BILIRUBIN TOTAL: 0.3 mg/dL (ref 0.3–1.2)
Bilirubin, Direct: <0.1 mg/dL — ABNORMAL LOW (ref 0.1–0.5)
TOTAL PROTEIN: 6.8 g/dL (ref 6.5–8.1)

## 2016-10-17 LAB — BASIC METABOLIC PANEL
ANION GAP: 12 (ref 5–15)
BUN: 13 mg/dL (ref 6–20)
CHLORIDE: 99 mmol/L — AB (ref 101–111)
CO2: 24 mmol/L (ref 22–32)
Calcium: 9.4 mg/dL (ref 8.9–10.3)
Creatinine, Ser: 0.89 mg/dL (ref 0.44–1.00)
GFR calc non Af Amer: >60 mL/min (ref >60)
Glucose, Bld: 110 mg/dL — ABNORMAL HIGH (ref 65–99)
POTASSIUM: 3.9 mmol/L (ref 3.5–5.1)
SODIUM: 135 mmol/L (ref 135–145)

## 2016-10-17 LAB — TROPONIN I: Troponin I: <0.03 ng/mL (ref <0.03)

## 2016-10-17 LAB — CBC
HCT: 38.6 % (ref 35.0–47.0)
HEMOGLOBIN: 13.6 g/dL (ref 12.0–16.0)
MCH: 32.9 pg (ref 26.0–34.0)
MCHC: 35.2 g/dL (ref 32.0–36.0)
MCV: 93.4 fL (ref 80.0–100.0)
PLATELETS: 245 10*3/uL (ref 150–440)
RBC: 4.13 MIL/uL (ref 3.80–5.20)
RDW: 13.3 % (ref 11.5–14.5)
WBC: 9.4 10*3/uL (ref 3.6–11.0)

## 2016-10-17 LAB — FIBRIN DERIVATIVES D-DIMER (ARMC ONLY): FIBRIN DERIVATIVES D-DIMER (ARMC): 205.36 ng{FEU}/mL (ref 0.00–499.00)

## 2016-10-17 LAB — LIPASE, BLOOD: Lipase: 30 U/L (ref 11–51)

## 2016-10-17 MED ORDER — HYDROMORPHONE HCL 1 MG/ML IJ SOLN
1.0000 mg | Freq: Once | INTRAMUSCULAR | Status: AC
  Administered 2016-10-17: 1 mg via INTRAVENOUS
  Filled 2016-10-17: qty 1

## 2016-10-17 MED ORDER — ONDANSETRON HCL 4 MG/2ML IJ SOLN
INTRAMUSCULAR | Status: AC
  Administered 2016-10-17: 4 mg via INTRAVENOUS
  Filled 2016-10-17: qty 2

## 2016-10-17 MED ORDER — SUCRALFATE 1 G PO TABS
1.0000 g | ORAL_TABLET | Freq: Once | ORAL | Status: AC
  Administered 2016-10-17: 1 g via ORAL
  Filled 2016-10-17: qty 1

## 2016-10-17 MED ORDER — SODIUM CHLORIDE 0.9 % IV BOLUS (SEPSIS)
1000.0000 mL | Freq: Once | INTRAVENOUS | Status: AC
  Administered 2016-10-17: 1000 mL via INTRAVENOUS

## 2016-10-17 MED ORDER — METOCLOPRAMIDE HCL 10 MG PO TABS: 10.0000 mg | ORAL_TABLET | Freq: Three times a day (TID) | ORAL | 0 refills | Status: DC | PRN

## 2016-10-17 MED ORDER — ONDANSETRON HCL 4 MG/2ML IJ SOLN
4.0000 mg | Freq: Once | INTRAMUSCULAR | Status: AC
  Administered 2016-10-17: 4 mg via INTRAVENOUS

## 2016-10-17 MED ORDER — IOPAMIDOL (ISOVUE-370) INJECTION 76%
75.0000 mL | Freq: Once | INTRAVENOUS | Status: AC | PRN
  Administered 2016-10-17: 75 mL via INTRAVENOUS

## 2016-10-17 MED ORDER — ONDANSETRON HCL 4 MG/2ML IJ SOLN
4.0000 mg | Freq: Once | INTRAMUSCULAR | Status: AC
  Administered 2016-10-17: 4 mg via INTRAVENOUS
  Filled 2016-10-17: qty 2

## 2016-10-17 MED ORDER — SUCRALFATE 1 G PO TABS: 1.0000 g | ORAL_TABLET | Freq: Two times a day (BID) | ORAL | 0 refills | Status: DC

## 2016-10-17 MED ORDER — GI COCKTAIL ~~LOC~~
30.0000 mL | Freq: Once | ORAL | Status: AC
  Administered 2016-10-17: 30 mL via ORAL
  Filled 2016-10-17: qty 30

## 2016-10-17 NOTE — ED Triage Notes (Signed)
Patient to rm 12 via EMS from home for chest pain.  Reports chest pain off/on for days, worse tonight.  Patient reports pain mid sternal radiates into back of right arm and straight through to her back with shortness of breath, described as throbbing/ pressure.

## 2016-10-17 NOTE — ED Notes (Signed)
Patient transported to X-ray 

## 2016-10-17 NOTE — ED Provider Notes (Signed)
[facility] Emergency Department Provider Note   ____________________________________________   First MD Initiated Contact with Patient 10/17/16 [phone]     (approximate)  I have reviewed the triage vital signs and the nursing notes.   HISTORY  Chief Complaint Chest Pain    HPI Brandy Robinson is a 51 y.o. female Who comes into the hospital today with chest pain. She reports that she's been having chest pain going through to her back on and off for the past 1-2 weeks. She states that about an hour or 2 before she came in the pain got worse. She was laying in bed and started vomiting. She reports that she kept hurting. She's had some shortness of breath and states that the pain is worse when she takes a deep breath. The patient denies any abdominal pain. She feels lightheaded and states that her pain as a 7 out of 10 in intensity. She's never had pain like this before. The patient does have a history of panic attacks and GERD but nothing has ever been this bad. She is here today for evaluation.   Past Medical History:  Diagnosis Date  . Anxiety   . Bipolar disorder (HCC)   . Depression   . Disc disorder   . Dyspareunia   . Endometriosis   . GERD (gastroesophageal reflux disease)   . Insomnia   . Lichen   . Surgical menopause   . Vaginal atrophy   . Wears glasses     Patient Active Problem List   Diagnosis Date Noted  . Acute respiratory failure (HCC)   . Perforated viscus 12/30/2014  . Lichen sclerosus 09/24/2014  . Surgical menopause 09/24/2014  . Endometriosis 09/24/2014  . Bipolar 1 disorder (HCC) 09/24/2014  . Insomnia 09/24/2014  . GERD (gastroesophageal reflux disease) 09/24/2014  . History of stomach ulcers 09/24/2014    Past Surgical History:  Procedure Laterality Date  . ABDOMINAL HYSTERECTOMY  1995   [facility]  . ARM WOUND REPAIR / CLOSURE  2013   cellulitis rt arm-i/d  . KNEE BURSECTOMY Left 09/24/2013   Procedure: IRRIGATION AND  DEBRIDEMENT OF LEFT KNEE ;  Surgeon: Sheral Apley, MD;  Location:  [facility];  Service: Orthopedics;  Laterality: Left;  . LAPAROTOMY N/A 12/30/2014   Procedure: EXPLORATORY LAPAROTOMY-graham patch of peptic ulcer;  Surgeon: Ricarda Frame, MD;  Location: [facility] ORS;  Service: General;  Laterality: N/A;    Prior to Admission medications   Medication Sig Start Date End Date Taking? Authorizing Provider  Atorvastatin Calcium (LIPITOR PO) Take 1 tablet by mouth daily.   Yes [provider]  buPROPion (WELLBUTRIN SR) 150 MG 12 hr tablet Take 150 mg by mouth daily.   Yes [provider]  busPIRone (BUSPAR) 10 MG tablet Take 20 mg by mouth 4 (four) times daily.    Yes [provider]  conjugated estrogens (PREMARIN) vaginal cream Place 0.5 Applicatorfuls vaginally every Monday, Wednesday, and Friday. 03/04/16  Yes Defrancesco, Prentice Docker, MD  DULoxetine (CYMBALTA) 30 MG capsule Take 30 mg by mouth daily.   Yes [provider]  estradiol (ESTRACE) 1 MG tablet Take 1.5 tablets (1.5 mg total) by mouth daily. 03/03/16  Yes Defrancesco, Prentice Docker, MD  pantoprazole (PROTONIX) 40 MG tablet Take 1 tablet (40 mg total) by mouth daily. 05/13/15  Yes Loflin, Laney Potash, MD  QUEtiapine (SEROQUEL XR) 300 MG 24 hr tablet Take 300 mg by mouth at bedtime.   Yes [provider]  celecoxib (CELEBREX) 100 MG capsule Take 1 capsule (100 mg total) by mouth 2 (two) times daily. 01/28/15   Ricarda Frame, MD  clonazePAM (KLONOPIN) 0.5 MG disintegrating tablet Take 1 mg by mouth daily as needed.     [provider]  cyclobenzaprine (FLEXERIL) 10 MG tablet Take 10 mg by mouth 3 (three) times daily as needed for muscle spasms.    [provider]  metoCLOPramide (REGLAN) 10 MG tablet Take 1 tablet (10 mg total) by mouth every 8 (eight) hours as needed. 10/17/16   Rebecka Apley, MD  mometasone (ELOCON) 0.1 % ointment Apply 1 application topically 2  (two) times a week. Patient not taking: Reported on 10/17/2016 03/03/16   Defrancesco, Prentice Docker, MD  sucralfate (CARAFATE) 1 g tablet Take 1 tablet (1 g total) by mouth 2 (two) times daily. 10/17/16   Rebecka Apley, MD    Allergies Hydrocodone; Aspirin; and Morphine and related  Family History  Problem Relation Age of Onset  . Diabetes Mother   . Diabetes Sister   . Cancer Father        Throat  . Heart disease Neg Hx     Social History Social History  Substance Use Topics  . Smoking status: Current Every Day Smoker    Packs/day: 1.00    Types: Cigarettes  . Smokeless tobacco: Never Used  . Alcohol use No    Review of Systems  Constitutional: No fever/chills Eyes: No visual changes. ENT: No sore throat. Cardiovascular:  chest pain. Respiratory: shortness of breath. Gastrointestinal: nausea and vomiting with No abdominal pain.  No diarrhea.  No constipation. Genitourinary: Negative for dysuria. Musculoskeletal: back pain. Skin: Negative for rash. Neurological: Negative for headaches, focal weakness or numbness.   ____________________________________________   PHYSICAL EXAM:  VITAL SIGNS: ED Triage Vitals [10/17/16 0321]  Enc Vitals Group     BP (!) 151/84     Pulse Rate (!) 107     Resp (!) 22     Temp 97.6 F (36.4 C)     Temp Source Oral     SpO2 100 %     Weight 175 lb (79.4 kg)     Height  (1.6 m)     Head Circumference      Peak Flow      Pain Score 6     Pain Loc      Pain Edu?      Excl. in GC?     Constitutional: Alert and oriented. Well appearing and in moderate distress. Eyes: Conjunctivae are normal. PERRL. EOMI. Head: Atraumatic. Nose: No congestion/rhinnorhea. Mouth/Throat: Mucous membranes are moist.  Oropharynx non-erythematous. Cardiovascular: tachycardia regular rhythm. Grossly normal heart sounds.  Good peripheral circulation. Respiratory: Normal respiratory effort.  No retractions. Lungs CTAB. Gastrointestinal: Soft mild  epigastric tenderness to palpation. No distention. positive bowel sounds Musculoskeletal: No lower extremity tenderness nor edema.   Neurologic:  Normal speech and language.  Skin:  Skin is warm, dry and intact.  Psychiatric: Mood and affect are normal.   ____________________________________________   LABS (all labs ordered are listed, but only abnormal results are displayed)  Labs Reviewed  BASIC METABOLIC PANEL - Abnormal; Notable for the following:       Result Value   Chloride 99 (*)    Glucose, Bld 110 (*)    All other components within normal limits  HEPATIC FUNCTION PANEL - Abnormal; Notable for the following:    Albumin 3.4 (*)    Bilirubin,  Direct <0.1 (*)    All other components within normal limits  CBC  TROPONIN I  LIPASE, BLOOD  FIBRIN DERIVATIVES D-DIMER ([facility] ONLY)  TROPONIN I   ____________________________________________  EKG  ED ECG REPORT I, Rebecka Apley, the attending physician, personally viewed and interpreted this ECG.   Date: 10/17/2016  EKG Time: 321  Rate: 108  Rhythm: sinus tachycardia  Axis: normal  Intervals:none  ST&T Change: none  ED ECG REPORT I, Rebecka Apley, the attending physician, personally viewed and interpreted this ECG.   Date: 10/17/2016  EKG Time: 515  Rate: 103  Rhythm: normal sinus rhythm  Axis: normal  Intervals:none  ST&T Change: none   ____________________________________________  RADIOLOGY  Dg Chest 2 View  Result Date: 10/17/2016 CLINICAL DATA:  Midsternal chest pain radiating of the right arm. Shortness of breath. EXAM: CHEST  2 VIEW COMPARISON:  01/10/2015 FINDINGS: Linear scarring in the right lung base. No airspace disease or consolidation in the lungs. No blunting of costophrenic angles. No pneumothorax. Heart size and pulmonary vascularity are normal. IMPRESSION: Linear scarring in the right lung base. No evidence of active pulmonary disease. Electronically Signed   By: Burman Nieves  M.D.   On: 10/17/2016 03:49   Ct Angio Chest Aorta W And/or Wo Contrast  Result Date: 10/17/2016 CLINICAL DATA:  51 y/o F; intermittent chest pain with concern for aortic dissection. EXAM: CT ANGIOGRAPHY CHEST WITH CONTRAST TECHNIQUE: Multidetector CT imaging of the chest was performed using the standard protocol during bolus administration of intravenous contrast. Multiplanar CT image reconstructions and MIPs were obtained to evaluate the vascular anatomy. CONTRAST:  75 cc Isovue 370 COMPARISON:  12/30/2014 CT angiogram of the chest. FINDINGS: Cardiovascular: Preferential opacification of the thoracic aorta. No evidence of thoracic aortic aneurysm or dissection. Moderate mixed atherosclerotic plaque of the thoracic aorta. Normal heart size. No pericardial effusion. Mediastinum/Nodes: No mediastinal adenopathy. Calcified subcarinal and left hilar lymph nodes compatible with sequelae of prior granulomatous disease. Normal thoracic esophagus. Normal thyroid gland. Lungs/Pleura: Centrilobular ground-glass nodules likely representing smoking associated respiratory bronchiolitis. No consolidation, pleural effusion, or pneumothorax. Upper Abdomen: Multiple stable splenules in the left upper quadrant. Musculoskeletal: No chest wall abnormality. No acute or significant osseous findings. Review of the MIP images confirms the above findings. IMPRESSION: 1. Normal caliber thoracic aorta. No dissection or findings of aortitis. Moderate mixed atherosclerotic plaque. 2. Centrilobular ground-glass nodules likely representing smoking associated respiratory bronchiolitis. 3. Otherwise unremarkable CTA of the chest. Electronically Signed   By: Mitzi Hansen M.D.   On: 10/17/2016 06:58    ____________________________________________   PROCEDURES  Procedure(s) performed: None  Procedures  Critical Care performed: No  ____________________________________________   INITIAL IMPRESSION / ASSESSMENT AND PLAN  / ED COURSE  Pertinent labs & imaging results that were available during my care of the patient were reviewed by me and considered in my medical decision making (see chart for details).  This is a 51 year old female who comes into the hospital today with some chest pain radiating to her back.  My differential diagnosis includes acute coronary syndrome, pulmonary embolism, aortic dissection, gastroesophageal reflux.  I initially did give the patient a dose of Dilaudid and Zofran as she was vomiting when she arrived. the patient also received a liter of normal saline. I sent some blood work including a chest x-ray. The patient's blood work was negative and the patient's initial d-dimer was negative. I decided to send the patient for a CTA looking at her  aorta because she had pain going into her back. The CTA was negative. The patient's pain returns were repeated the EKG which was the same and we gave the patient another dose of Dilaudid with the GI cocktail. When I went back in to evaluate the patient and she reports that her pain is improved and not as severe as previous. She is drinking ginger ale and no longer vomiting. I feel that the patient may have pain that is more consistent with gastroesophageal reflux versus ulcer but she also does need to follow back up with cardiology for further evaluation. The patient will be discharged home to follow-up. She has no further questions or concerns at this time.      ____________________________________________   FINAL CLINICAL IMPRESSION(S) / ED DIAGNOSES  Final diagnoses:  Chest pain, unspecified type  Nausea and vomiting, intractability of vomiting not specified, unspecified vomiting type      NEW MEDICATIONS STARTED DURING THIS VISIT:  New Prescriptions   METOCLOPRAMIDE (REGLAN) 10 MG TABLET    Take 1 tablet (10 mg total) by mouth every 8 (eight) hours as needed.   SUCRALFATE (CARAFATE) 1 G TABLET    Take 1 tablet (1 g total) by mouth 2  (two) times daily.     Note:  This document was prepared using Dragon voice recognition software and may include unintentional dictation errors.    Rebecka Apley, MD 10/17/16 [phone]

## 2016-10-17 NOTE — Discharge Instructions (Signed)
please follow-up with your primary care physician as well as cardiology for further evaluation of these symptoms. Please return with any worsening symptoms or any worsening condition.

## 2016-10-17 NOTE — ED Notes (Addendum)
Pt alert and oriented X4, active, cooperative, pt in NAD. RR even and unlabored, color WNL.  Pt informed to return if any life threatening symptoms occur.  Discharge and followup instructions reviewed.  Pt able to eat and drink without emesis prior to discharge.

## 2016-10-17 NOTE — ED Notes (Signed)
Pt called out to report pain as pressure in chest has returned quickly, EKG repeated, Dr Zenda Alpers notified

## 2016-10-20 ENCOUNTER — Emergency Department
Admission: EM | Admit: 2016-10-20 | Discharge: 2016-10-20 | Disposition: A | Discharge status: Home / Self Care | Payer: Self-pay | Attending: Emergency Medicine | Admitting: Emergency Medicine

## 2016-10-20 ENCOUNTER — Encounter: Payer: Self-pay | Admitting: Emergency Medicine

## 2016-10-20 ENCOUNTER — Emergency Department: Payer: Self-pay

## 2016-10-20 DIAGNOSIS — Z79899 Other long term (current) drug therapy: Secondary | ICD-10-CM | POA: Insufficient documentation

## 2016-10-20 DIAGNOSIS — K529 Noninfective gastroenteritis and colitis, unspecified: Secondary | ICD-10-CM | POA: Insufficient documentation

## 2016-10-20 DIAGNOSIS — F17210 Nicotine dependence, cigarettes, uncomplicated: Secondary | ICD-10-CM | POA: Insufficient documentation

## 2016-10-20 LAB — URINALYSIS, COMPLETE (UACMP) WITH MICROSCOPIC
Bilirubin Urine: NEGATIVE
Glucose, UA: NEGATIVE mg/dL
Hgb urine dipstick: NEGATIVE
Ketones, ur: NEGATIVE mg/dL
Nitrite: NEGATIVE
PROTEIN: NEGATIVE mg/dL
Specific Gravity, Urine: 1.04 — ABNORMAL HIGH (ref 1.005–1.030)
pH: 5 (ref 5.0–8.0)

## 2016-10-20 LAB — COMPREHENSIVE METABOLIC PANEL
ALT: 18 U/L (ref 14–54)
AST: 31 U/L (ref 15–41)
Albumin: 3.6 g/dL (ref 3.5–5.0)
Alkaline Phosphatase: 119 U/L (ref 38–126)
Anion gap: 10 (ref 5–15)
BILIRUBIN TOTAL: 0.5 mg/dL (ref 0.3–1.2)
BUN: 11 mg/dL (ref 6–20)
CO2: 25 mmol/L (ref 22–32)
Calcium: 9.3 mg/dL (ref 8.9–10.3)
Chloride: 100 mmol/L — ABNORMAL LOW (ref 101–111)
Creatinine, Ser: 0.83 mg/dL (ref 0.44–1.00)
GFR calc Af Amer: >60 mL/min (ref >60)
Glucose, Bld: 155 mg/dL — ABNORMAL HIGH (ref 65–99)
POTASSIUM: 4 mmol/L (ref 3.5–5.1)
Sodium: 135 mmol/L (ref 135–145)
TOTAL PROTEIN: 7 g/dL (ref 6.5–8.1)

## 2016-10-20 LAB — CBC
HEMATOCRIT: 39.4 % (ref 35.0–47.0)
Hemoglobin: 13.6 g/dL (ref 12.0–16.0)
MCH: 32.2 pg (ref 26.0–34.0)
MCHC: 34.5 g/dL (ref 32.0–36.0)
MCV: 93.4 fL (ref 80.0–100.0)
PLATELETS: 262 10*3/uL (ref 150–440)
RBC: 4.22 MIL/uL (ref 3.80–5.20)
RDW: 13.5 % (ref 11.5–14.5)
WBC: 9.2 10*3/uL (ref 3.6–11.0)

## 2016-10-20 LAB — LIPASE, BLOOD: Lipase: 27 U/L (ref 11–51)

## 2016-10-20 MED ORDER — METRONIDAZOLE 500 MG PO TABS: 500.0000 mg | ORAL_TABLET | Freq: Three times a day (TID) | ORAL | 0 refills | Status: AC

## 2016-10-20 MED ORDER — METRONIDAZOLE 500 MG PO TABS
500.0000 mg | ORAL_TABLET | Freq: Once | ORAL | Status: AC
  Administered 2016-10-20: 500 mg via ORAL
  Filled 2016-10-20: qty 1

## 2016-10-20 MED ORDER — AMOXICILLIN-POT CLAVULANATE 875-125 MG PO TABS: 1.0000 | ORAL_TABLET | Freq: Two times a day (BID) | ORAL | 0 refills | Status: AC

## 2016-10-20 MED ORDER — ACETAMINOPHEN 500 MG PO TABS
1000.0000 mg | ORAL_TABLET | Freq: Once | ORAL | Status: DC
  Filled 2016-10-20: qty 2

## 2016-10-20 MED ORDER — ONDANSETRON HCL 4 MG/2ML IJ SOLN
4.0000 mg | Freq: Once | INTRAMUSCULAR | Status: AC
  Administered 2016-10-20: 4 mg via INTRAVENOUS
  Filled 2016-10-20: qty 2

## 2016-10-20 MED ORDER — FENTANYL CITRATE (PF) 100 MCG/2ML IJ SOLN
50.0000 ug | Freq: Once | INTRAMUSCULAR | Status: AC
  Administered 2016-10-20: 50 ug via INTRAVENOUS
  Filled 2016-10-20: qty 2

## 2016-10-20 MED ORDER — OXYCODONE HCL 5 MG PO TABS
5.0000 mg | ORAL_TABLET | Freq: Once | ORAL | Status: DC
  Filled 2016-10-20: qty 1

## 2016-10-20 MED ORDER — ONDANSETRON 4 MG PO TBDP: 4.0000 mg | ORAL_TABLET | Freq: Three times a day (TID) | ORAL | 0 refills | Status: DC | PRN

## 2016-10-20 MED ORDER — AMOXICILLIN-POT CLAVULANATE 875-125 MG PO TABS
1.0000 | ORAL_TABLET | Freq: Once | ORAL | Status: AC
  Administered 2016-10-20: 1 via ORAL
  Filled 2016-10-20: qty 1

## 2016-10-20 MED ORDER — OXYCODONE-ACETAMINOPHEN 5-325 MG PO TABS: 1.0000 | ORAL_TABLET | Freq: Four times a day (QID) | ORAL | 0 refills | Status: DC | PRN

## 2016-10-20 MED ORDER — IOPAMIDOL (ISOVUE-300) INJECTION 61%
100.0000 mL | Freq: Once | INTRAVENOUS | Status: AC | PRN
  Administered 2016-10-20: 100 mL via INTRAVENOUS

## 2016-10-20 NOTE — ED Provider Notes (Signed)
[facility] Emergency Department Provider Note  ____________________________________________  Time seen: Approximately 6:15 PM  I have reviewed the triage vital signs and the nursing notes.   HISTORY  Chief Complaint Abdominal Pain   HPI Brandy Robinson is a 51 y.o. female with a history of perforated stomach ulcer in 2016, GERD who presents for evaluation of abdominal pain. Patient reports that she was evaluated here 3 days ago for similar complaints and at that time underwent a chest pain evaluation with cardiac enzymes, EKG, CT of the chest. She felt better when she went home. Today the pain returned. She describes that the pain is dull, severe, starts in her epigastric region and radiates bilaterally to her back, it lasts 10 min at resolves. Currently pain is 8/10. No SOB, no CP, she has had nausea but no vomiting. Normal bowel movement with the last one this morning. No melena. Passive flatus. Slightly distended abdomen.  Past Medical History:  Diagnosis Date  . Anxiety   . Bipolar disorder (HCC)   . Depression   . Disc disorder   . Dyspareunia   . Endometriosis   . GERD (gastroesophageal reflux disease)   . Insomnia   . Lichen   . Surgical menopause   . Vaginal atrophy   . Wears glasses     Patient Active Problem List   Diagnosis Date Noted  . Acute respiratory failure (HCC)   . Perforated viscus 12/30/2014  . Lichen sclerosus 09/24/2014  . Surgical menopause 09/24/2014  . Endometriosis 09/24/2014  . Bipolar 1 disorder (HCC) 09/24/2014  . Insomnia 09/24/2014  . GERD (gastroesophageal reflux disease) 09/24/2014  . History of stomach ulcers 09/24/2014    Past Surgical History:  Procedure Laterality Date  . ABDOMINAL HYSTERECTOMY  1995   [facility]  . ARM WOUND REPAIR / CLOSURE  2013   cellulitis rt arm-i/d  . KNEE BURSECTOMY Left 09/24/2013   Procedure: IRRIGATION AND DEBRIDEMENT OF LEFT KNEE ;  Surgeon: Sheral Apley, MD;  Location:  [facility];  Service: Orthopedics;  Laterality: Left;  . LAPAROTOMY N/A 12/30/2014   Procedure: EXPLORATORY LAPAROTOMY-graham patch of peptic ulcer;  Surgeon: Ricarda Frame, MD;  Location: [facility] ORS;  Service: General;  Laterality: N/A;    Prior to Admission medications   Medication Sig Start Date End Date Taking? Authorizing Provider  amoxicillin-clavulanate (AUGMENTIN) 875-125 MG tablet Take 1 tablet by mouth 2 (two) times daily. 10/20/16 10/30/16  Nita Sickle, MD  Atorvastatin Calcium (LIPITOR PO) Take 1 tablet by mouth daily.    [provider]  buPROPion (WELLBUTRIN SR) 150 MG 12 hr tablet Take 150 mg by mouth daily.    [provider]  busPIRone (BUSPAR) 10 MG tablet Take 20 mg by mouth 4 (four) times daily.     [provider]  celecoxib (CELEBREX) 100 MG capsule Take 1 capsule (100 mg total) by mouth 2 (two) times daily. 01/28/15   Ricarda Frame, MD  clonazePAM (KLONOPIN) 0.5 MG disintegrating tablet Take 1 mg by mouth daily as needed.     [provider]  conjugated estrogens (PREMARIN) vaginal cream Place 0.5 Applicatorfuls vaginally every Monday, Wednesday, and Friday. 03/04/16   Defrancesco, Prentice Docker, MD  cyclobenzaprine (FLEXERIL) 10 MG tablet Take 10 mg by mouth 3 (three) times daily as needed for muscle spasms.    [provider]  DULoxetine (CYMBALTA) 30 MG capsule Take 30 mg by mouth daily.    [provider]  estradiol (ESTRACE)  1 MG tablet Take 1.5 tablets (1.5 mg total) by mouth daily. 03/03/16   Defrancesco, Prentice Docker, MD  metoCLOPramide (REGLAN) 10 MG tablet Take 1 tablet (10 mg total) by mouth every 8 (eight) hours as needed. 10/17/16   Rebecka Apley, MD  metroNIDAZOLE (FLAGYL) 500 MG tablet Take 1 tablet (500 mg total) by mouth 3 (three) times daily. 10/20/16 11/03/16  Nita Sickle, MD  mometasone (ELOCON) 0.1 % ointment Apply 1 application topically 2 (two) times a week. Patient not  taking: Reported on 10/17/2016 03/03/16   Defrancesco, Prentice Docker, MD  ondansetron (ZOFRAN ODT) 4 MG disintegrating tablet Take 1 tablet (4 mg total) by mouth every 8 (eight) hours as needed for nausea or vomiting. 10/20/16   Nita Sickle, MD  oxyCODONE-acetaminophen (ROXICET) 5-325 MG tablet Take 1 tablet by mouth every 6 (six) hours as needed. 10/20/16 10/20/17  Nita Sickle, MD  pantoprazole (PROTONIX) 40 MG tablet Take 1 tablet (40 mg total) by mouth daily. 05/13/15   Gladis Riffle, MD  QUEtiapine (SEROQUEL XR) 300 MG 24 hr tablet Take 300 mg by mouth at bedtime.    [provider]  sucralfate (CARAFATE) 1 g tablet Take 1 tablet (1 g total) by mouth 2 (two) times daily. 10/17/16   Rebecka Apley, MD    Allergies Hydrocodone; Aspirin; and Morphine and related  Family History  Problem Relation Age of Onset  . Diabetes Mother   . Diabetes Sister   . Cancer Father        Throat  . Heart disease Neg Hx     Social History Social History  Substance Use Topics  . Smoking status: Current Every Day Smoker    Packs/day: 1.00    Types: Cigarettes  . Smokeless tobacco: Never Used  . Alcohol use No    Review of Systems  Constitutional: Negative for fever. Eyes: Negative for visual changes. ENT: Negative for sore throat. Neck: No neck pain  Cardiovascular: Negative for chest pain. Respiratory: Negative for shortness of breath. Gastrointestinal: + abdominal pain and nausea. No vomiting or diarrhea. Genitourinary: Negative for dysuria. Musculoskeletal: Negative for back pain. Skin: Negative for rash. Neurological: Negative for headaches, weakness or numbness. Psych: No SI or HI  ____________________________________________   PHYSICAL EXAM:  VITAL SIGNS: ED Triage Vitals [10/20/16 1652]  Enc Vitals Group     BP (!) 146/73     Pulse Rate (!) 102     Resp 18     Temp 98.7 F (37.1 C)     Temp Source Oral     SpO2 99 %     Weight 170 lb (77.1 kg)      Height  (1.6 m)     Head Circumference      Peak Flow      Pain Score 6     Pain Loc      Pain Edu?      Excl. in GC?     Constitutional: Alert and oriented. Well appearing and in no apparent distress. HEENT:      Head: Normocephalic and atraumatic.         Eyes: Conjunctivae are normal. Sclera is non-icteric.       Mouth/Throat: Mucous membranes are moist.       Neck: Supple with no signs of meningismus. Cardiovascular: Regular rate and rhythm. No murmurs, gallops, or rubs. 2+ symmetrical distal pulses are present in all extremities. No JVD. Respiratory: Normal respiratory effort. Lungs are clear to auscultation  bilaterally. No wheezes, crackles, or rhonchi.  Gastrointestinal: Soft, slightly distended with hypoactive bowel sounds, tender to palpation on the epigastric region. No rebound or guarding. Genitourinary: No CVA tenderness. Musculoskeletal: Nontender with normal range of motion in all extremities. No edema, cyanosis, or erythema of extremities. Neurologic: Normal speech and language. Face is symmetric. Moving all extremities. No gross focal neurologic deficits are appreciated. Skin: Skin is warm, dry and intact. No rash noted. Psychiatric: Mood and affect are normal. Speech and behavior are normal.  ____________________________________________   LABS (all labs ordered are listed, but only abnormal results are displayed)  Labs Reviewed  COMPREHENSIVE METABOLIC PANEL - Abnormal; Notable for the following:       Result Value   Chloride 100 (*)    Glucose, Bld 155 (*)    All other components within normal limits  URINALYSIS, COMPLETE (UACMP) WITH MICROSCOPIC - Abnormal; Notable for the following:    Color, Urine YELLOW (*)    APPearance CLEAR (*)    Specific Gravity, Urine 1.040 (*)    Leukocytes, UA LARGE (*)    Bacteria, UA RARE (*)    Squamous Epithelial / LPF 0-5 (*)    All other components within normal limits  LIPASE, BLOOD  CBC    ____________________________________________  EKG  ED ECG REPORT I, Nita Sickle, the attending physician, personally viewed and interpreted this ECG.  Sinus tachycardia, rate of 106, normal intervals, normal axis, no ST elevations or depressions. unchanged from prior from 3 days ago. ____________________________________________  RADIOLOGY  CT a/p: . Examination is positive for mild pancolitis with mild terminal ileitis. No complicating features identified. Specifically there is no bowel perforation, pneumatosis or abscess. 2. Aortic Atherosclerosis (ICD10-I70.0).  ____________________________________________   PROCEDURES  Procedure(s) performed: None Procedures Critical Care performed:  None ____________________________________________   INITIAL IMPRESSION / ASSESSMENT AND PLAN / ED COURSE   51 y.o. female with a history of perforated stomach ulcer in 2016, GERD who presents for evaluation of intermittent dull severe epigastric abdominal pain associated with nausea. Patient is well-appearing, slightly tachycardic, remaining of her vital signs are within normal limits, abdomen is slightly distended with tenderness in the epigastric region, no rebound or guarding, hypoactive bowel sounds. Patient underwent CT angiogram of the chest 3 days ago with no evidence of PE or dissection. labs here with no acute findings with normal CMP, lipase, and CBC. Ddx PUD vs gallbladder vs SBO vs gastritis vs pancreatitis vs colitis. Will give morphine, Zofran for symptom relief. We'll send patient for CT abdomen and pelvis.    _________________________ 8:12 PM on 10/20/2016 -----------------------------------------  CT concerning for pancolitis. No bowel perforation or abscess. Patient was started on Augmentin and Flagyl. She is going to be discharged home on a 10 day course, Percocet and Zofran. Recommend close follow-up with primary care doctor or return to the emergency room for  worsening pain, fever, dehydration.  Pertinent labs & imaging results that were available during my care of the patient were reviewed by me and considered in my medical decision making (see chart for details).    ____________________________________________   FINAL CLINICAL IMPRESSION(S) / ED DIAGNOSES  Final diagnoses:  Colitis      NEW MEDICATIONS STARTED DURING THIS VISIT:  New Prescriptions   AMOXICILLIN-CLAVULANATE (AUGMENTIN) 875-125 MG TABLET    Take 1 tablet by mouth 2 (two) times daily.   METRONIDAZOLE (FLAGYL) 500 MG TABLET    Take 1 tablet (500 mg total) by mouth 3 (three) times daily.   ONDANSETRON (  ZOFRAN ODT) 4 MG DISINTEGRATING TABLET    Take 1 tablet (4 mg total) by mouth every 8 (eight) hours as needed for nausea or vomiting.   OXYCODONE-ACETAMINOPHEN (ROXICET) 5-325 MG TABLET    Take 1 tablet by mouth every 6 (six) hours as needed.     Note:  This document was prepared using Dragon voice recognition software and may include unintentional dictation errors.    Nita Sickle, MD 10/20/16 2012

## 2016-10-20 NOTE — ED Notes (Signed)
Pt arrives from [facility] for eval of continued abd pain

## 2016-10-20 NOTE — ED Notes (Signed)
Pt. Verbalizes understanding of d/c instructions, prescriptions, and follow-up. VS stable.Pt. In NAD at time of d/c and denies further concerns regarding this visit. Pt. Stable at the time of departure from the unit, departing unit by the safest and most appropriate manner per that pt condition and limitations. Pt advised to return to the ED at any time for emergent concerns, or for new/worsening symptoms.   

## 2016-10-20 NOTE — ED Triage Notes (Signed)
Patient presents to ED via POV from pcp office. Patient reports abdominal pain x2 weeks. Patient also reports vomiting.

## 2016-11-26 ENCOUNTER — Emergency Department
Admission: EM | Admit: 2016-11-26 | Discharge: 2016-11-27 | Disposition: A | Discharge status: Home / Self Care | Payer: Self-pay | Attending: Emergency Medicine | Admitting: Emergency Medicine

## 2016-11-26 ENCOUNTER — Encounter: Payer: Self-pay | Admitting: Emergency Medicine

## 2016-11-26 ENCOUNTER — Emergency Department: Payer: Self-pay

## 2016-11-26 ENCOUNTER — Other Ambulatory Visit: Payer: Self-pay

## 2016-11-26 DIAGNOSIS — R1012 Left upper quadrant pain: Secondary | ICD-10-CM | POA: Insufficient documentation

## 2016-11-26 DIAGNOSIS — K2900 Acute gastritis without bleeding: Secondary | ICD-10-CM | POA: Insufficient documentation

## 2016-11-26 DIAGNOSIS — Z79899 Other long term (current) drug therapy: Secondary | ICD-10-CM | POA: Insufficient documentation

## 2016-11-26 DIAGNOSIS — F17210 Nicotine dependence, cigarettes, uncomplicated: Secondary | ICD-10-CM | POA: Insufficient documentation

## 2016-11-26 LAB — CBC
HEMATOCRIT: 40 % (ref 35.0–47.0)
Hemoglobin: 13.3 g/dL (ref 12.0–16.0)
MCH: 31.6 pg (ref 26.0–34.0)
MCHC: 33.3 g/dL (ref 32.0–36.0)
MCV: 94.9 fL (ref 80.0–100.0)
Platelets: 291 10*3/uL (ref 150–440)
RBC: 4.22 MIL/uL (ref 3.80–5.20)
RDW: 13 % (ref 11.5–14.5)
WBC: 8 10*3/uL (ref 3.6–11.0)

## 2016-11-26 LAB — BASIC METABOLIC PANEL
Anion gap: 10 (ref 5–15)
BUN: 14 mg/dL (ref 6–20)
CHLORIDE: 100 mmol/L — AB (ref 101–111)
CO2: 25 mmol/L (ref 22–32)
Calcium: 9 mg/dL (ref 8.9–10.3)
Creatinine, Ser: 0.86 mg/dL (ref 0.44–1.00)
GFR calc Af Amer: >60 mL/min (ref >60)
GFR calc non Af Amer: >60 mL/min (ref >60)
Glucose, Bld: 117 mg/dL — ABNORMAL HIGH (ref 65–99)
POTASSIUM: 4 mmol/L (ref 3.5–5.1)
SODIUM: 135 mmol/L (ref 135–145)

## 2016-11-26 LAB — TROPONIN I: Troponin I: <0.03 ng/mL (ref <0.03)

## 2016-11-26 MED ORDER — GI COCKTAIL ~~LOC~~
30.0000 mL | Freq: Once | ORAL | Status: AC
  Administered 2016-11-27: 30 mL via ORAL

## 2016-11-26 MED ORDER — METOCLOPRAMIDE HCL 5 MG/ML IJ SOLN
10.0000 mg | Freq: Once | INTRAMUSCULAR | Status: AC
  Administered 2016-11-27: 10 mg via INTRAVENOUS

## 2016-11-26 NOTE — ED Triage Notes (Signed)
Pt ambulatory to triage in NAD, reports left sided chest pain, rib pain and back pain over past couple weeks.  Denies injury.  Reports some nausea w/ pain.  States pain is sharp and is intermittent.

## 2016-11-27 LAB — TROPONIN I

## 2016-11-27 LAB — HEPATIC FUNCTION PANEL
ALK PHOS: 126 U/L (ref 38–126)
ALT: 17 U/L (ref 14–54)
AST: 32 U/L (ref 15–41)
Albumin: 3.4 g/dL — ABNORMAL LOW (ref 3.5–5.0)
BILIRUBIN TOTAL: 0.4 mg/dL (ref 0.3–1.2)
TOTAL PROTEIN: 6.9 g/dL (ref 6.5–8.1)

## 2016-11-27 LAB — LIPASE, BLOOD: Lipase: 36 U/L (ref 11–51)

## 2016-11-27 MED ORDER — METOCLOPRAMIDE HCL 5 MG/ML IJ SOLN
INTRAMUSCULAR | Status: AC
  Administered 2016-11-27: 10 mg via INTRAVENOUS
  Filled 2016-11-27: qty 2

## 2016-11-27 MED ORDER — GI COCKTAIL ~~LOC~~
ORAL | Status: AC
  Administered 2016-11-27: 30 mL via ORAL
  Filled 2016-11-27: qty 30

## 2016-11-27 MED ORDER — SODIUM CHLORIDE 0.9 % IV BOLUS (SEPSIS)
500.0000 mL | Freq: Once | INTRAVENOUS | Status: AC
  Administered 2016-11-27: 500 mL via INTRAVENOUS

## 2016-11-27 MED ORDER — ONDANSETRON 4 MG PO TBDP: 4.0000 mg | ORAL_TABLET | Freq: Three times a day (TID) | ORAL | 0 refills | Status: DC | PRN

## 2016-11-27 MED ORDER — FAMOTIDINE 40 MG PO TABS: 40.0000 mg | ORAL_TABLET | Freq: Every evening | ORAL | 0 refills | Status: DC

## 2016-11-27 MED ORDER — OMEPRAZOLE 40 MG PO CPDR: 40.0000 mg | DELAYED_RELEASE_CAPSULE | Freq: Every day | ORAL | 0 refills | Status: DC

## 2016-11-27 NOTE — Discharge Instructions (Signed)
Please follow up with your primary care physician and GI

## 2016-11-27 NOTE — ED Notes (Signed)

## 2016-11-27 NOTE — ED Provider Notes (Signed)
[facility] Emergency Department Provider Note   ____________________________________________   First MD Initiated Contact with Patient 11/26/16 2350     (approximate)  I have reviewed the triage vital signs and the nursing notes.   HISTORY  Chief Complaint Back Pain and Chest Pain    HPI Brandy Robinson is a 51 y.o. female who comes into the hospital today with some left-sided chest pain. The patient reports that is going around the side into her back. She reports that she's had chest pain that started about 2 weeks ago. She reports also that every time she eats she gets pain and she feels really bad. She also vomits. The patient reports that now she doesn't even have to eat she just gets sick and has been throwing up. The patient states that Tylenol and ibuprofen have not been helping. The pain is dull and a little bit of pressure. The patient states that her pain is currently an 8 out of 10 in intensity. It is under her left breast she reports in her upper abdomen. The patient denies any diarrhea. She decided to come into the hospital today for further evaluation of her symptoms.   Past Medical History:  Diagnosis Date  . Anxiety   . Bipolar disorder (HCC)   . Depression   . Disc disorder   . Dyspareunia   . Endometriosis   . GERD (gastroesophageal reflux disease)   . Insomnia   . Lichen   . Surgical menopause   . Vaginal atrophy   . Wears glasses     Patient Active Problem List   Diagnosis Date Noted  . Acute respiratory failure (HCC)   . Perforated viscus 12/30/2014  . Lichen sclerosus 09/24/2014  . Surgical menopause 09/24/2014  . Endometriosis 09/24/2014  . Bipolar 1 disorder (HCC) 09/24/2014  . Insomnia 09/24/2014  . GERD (gastroesophageal reflux disease) 09/24/2014  . History of stomach ulcers 09/24/2014    Past Surgical History:  Procedure Laterality Date  . ABDOMINAL HYSTERECTOMY  1995   tah-bso  . ARM WOUND REPAIR /  CLOSURE  2013   cellulitis rt arm-i/d    Prior to Admission medications   Medication Sig Start Date End Date Taking? Authorizing Provider  Atorvastatin Calcium (LIPITOR PO) Take 1 tablet by mouth daily.    [provider]  buPROPion (WELLBUTRIN SR) 150 MG 12 hr tablet Take 150 mg by mouth daily.    [provider]  busPIRone (BUSPAR) 10 MG tablet Take 20 mg by mouth 4 (four) times daily.     [provider]  celecoxib (CELEBREX) 100 MG capsule Take 1 capsule (100 mg total) by mouth 2 (two) times daily. 01/28/15   Ricarda FrameWoodham, Charles, MD  clonazePAM (KLONOPIN) 0.5 MG disintegrating tablet Take 1 mg by mouth daily as needed.     [provider]  conjugated estrogens (PREMARIN) vaginal cream Place 0.5 Applicatorfuls vaginally every Monday, Wednesday, and Friday. 03/04/16   Defrancesco, Prentice DockerMartin A, MD  cyclobenzaprine (FLEXERIL) 10 MG tablet Take 10 mg by mouth 3 (three) times daily as needed for muscle spasms.    [provider]  DULoxetine (CYMBALTA) 30 MG capsule Take 30 mg by mouth daily.    [provider]  estradiol (ESTRACE) 1 MG tablet Take 1.5 tablets (1.5 mg total) by mouth daily. 03/03/16   Defrancesco, Prentice DockerMartin A, MD  famotidine (PEPCID) 40 MG tablet Take 1 tablet (40 mg total) every evening by mouth. 11/27/16 11/27/17  Rebecka ApleyWebster, Allison P,  MD  metoCLOPramide (REGLAN) 10 MG tablet Take 1 tablet (10 mg total) by mouth every 8 (eight) hours as needed. 10/17/16   Rebecka Apley, MD  mometasone (ELOCON) 0.1 % ointment Apply 1 application topically 2 (two) times a week. Patient not taking: Reported on 10/17/2016 03/03/16   Defrancesco, Prentice Docker, MD  omeprazole (PRILOSEC) 40 MG capsule Take 1 capsule (40 mg total) daily by mouth. 11/27/16 11/27/17  Rebecka Apley, MD  ondansetron (ZOFRAN ODT) 4 MG disintegrating tablet Take 1 tablet (4 mg total) by mouth every 8 (eight) hours as needed for nausea or vomiting. 10/20/16   Nita Sickle, MD    ondansetron (ZOFRAN ODT) 4 MG disintegrating tablet Take 1 tablet (4 mg total) every 8 (eight) hours as needed by mouth for nausea or vomiting. 11/27/16   Rebecka Apley, MD  oxyCODONE-acetaminophen (ROXICET) 5-325 MG tablet Take 1 tablet by mouth every 6 (six) hours as needed. 10/20/16 10/20/17  Nita Sickle, MD  pantoprazole (PROTONIX) 40 MG tablet Take 1 tablet (40 mg total) by mouth daily. 05/13/15   Gladis Riffle, MD  QUEtiapine (SEROQUEL XR) 300 MG 24 hr tablet Take 300 mg by mouth at bedtime.    [provider]  sucralfate (CARAFATE) 1 g tablet Take 1 tablet (1 g total) by mouth 2 (two) times daily. 10/17/16   Rebecka Apley, MD    Allergies Hydrocodone; Aspirin; and Morphine and related  Family History  Problem Relation Age of Onset  . Diabetes Mother   . Diabetes Sister   . Cancer Father        Throat  . Heart disease Neg Hx     Social History Social History   Tobacco Use  . Smoking status: Current Every Day Smoker    Packs/day: 1.00    Types: Cigarettes  . Smokeless tobacco: Never Used  Substance Use Topics  . Alcohol use: No  . Drug use: No    Review of Systems  Constitutional: No fever/chills Eyes: No visual changes. ENT: No sore throat. Cardiovascular:  chest pain. Respiratory: Denies shortness of breath. Gastrointestinal:  abdominal pain.   nausea,  vomiting.  No diarrhea.  No constipation. Genitourinary: Negative for dysuria. Musculoskeletal: Negative for back pain. Skin: Negative for rash. Neurological: Negative for headaches, focal weakness or numbness.   ____________________________________________   PHYSICAL EXAM:  VITAL SIGNS: ED Triage Vitals  Enc Vitals Group     BP 11/26/16 2031 (!) 151/79     Pulse Rate 11/26/16 2031 (!) 105     Resp 11/26/16 2031 18     Temp 11/26/16 2031 (!) 97.5 F (36.4 C)     Temp Source 11/26/16 2031 Oral     SpO2 11/26/16 2031 98 %     Weight 11/26/16 2031 170 lb (77.1 kg)      Height 11/26/16 2031 5\' 3"  (1.6 m)     Head Circumference --      Peak Flow --      Pain Score 11/26/16 2030 6     Pain Loc --      Pain Edu? --      Excl. in GC? --     Constitutional: Alert and oriented. Well appearing and in moderate distress. Eyes: Conjunctivae are normal. PERRL. EOMI. Head: Atraumatic. Nose: No congestion/rhinnorhea. Mouth/Throat: Mucous membranes are moist.  Oropharynx non-erythematous. Cardiovascular: tachycardia, regular rhythm. Grossly normal heart sounds.  Good peripheral circulation. Respiratory: Normal respiratory effort.  No retractions. Lungs CTAB. Gastrointestinal: Soft with  left upper quadrant tenderness to palpation. No distention. positive bowel sounds Musculoskeletal: No lower extremity tenderness nor edema.   Neurologic:  Normal speech and language.  Skin:  Skin is warm, dry and intact. Psychiatric: Mood and affect are normal.   ____________________________________________   LABS (all labs ordered are listed, but only abnormal results are displayed)  Labs Reviewed  BASIC METABOLIC PANEL - Abnormal; Notable for the following components:      Result Value   Chloride 100 (*)    Glucose, Bld 117 (*)    All other components within normal limits  HEPATIC FUNCTION PANEL - Abnormal; Notable for the following components:   Albumin 3.4 (*)    Bilirubin, Direct <0.1 (*)    All other components within normal limits  CBC  TROPONIN I  LIPASE, BLOOD  TROPONIN I   ____________________________________________  EKG  ED ECG REPORT I, Rebecka ApleyWebster,  Allison P, the attending physician, personally viewed and interpreted this ECG.   Date: 11/26/2016  EKG Time: 2027  Rate: 95  Rhythm: normal sinus rhythm  Axis: normal  Intervals:none  ST&T Change: none    ____________________________________________  RADIOLOGY  Dg Chest 2 View  Result Date: 11/26/2016 CLINICAL DATA:  51 y/o F; left-sided chest pain, rib pain, and back pain. EXAM: CHEST  2 VIEW  COMPARISON:  10/17/2016 chest radiograph and CT chest. FINDINGS: Normal cardiac silhouette. Bronchitic changes in the lung bases. No focal consolidation, pleural effusion, or pneumothorax. Bones are unremarkable. IMPRESSION: Bronchitic changes in lung bases.  No focal consolidation. Electronically Signed   By: Mitzi HansenLance  Furusawa-Stratton M.D.   On: 11/26/2016 21:10    ____________________________________________   PROCEDURES  Procedure(s) performed: None  Procedures  Critical Care performed: No  ____________________________________________   INITIAL IMPRESSION / ASSESSMENT AND PLAN / ED COURSE  As part of my medical decision making, I reviewed the following data within the electronic MEDICAL RECORD NUMBER Notes from prior ED visits and [place] Controlled Substance Database   This is a 51 year old female who comes into the hospital today with some sided upper abdominal pain and left-sided chest pain.   the patient's differential diagnosis includes gastritis, acute coronary syndrome, pneumonia, pancreatitis.  We did check some blood work and the patient's buttocks unremarkable. Her lipase is 36 and her 2 troponins are negative. I gave the patient a GI cocktail as well as some Carafate and Reglan. The patient's pain did improve. The patient's family though was concerned because she's had a perforated viscus in the past. I splinted under her blood work is unremarkable and her pain was improved. The patient also had a CT scan done on October 1 as well as on October 4. She had been diagnosed with pancolitis on October 4 and was given antibiotics. I did offer a CT scan to the patient if she was concerned that she declined the study. As the patient's blood work is unremarkable and her pain is improved at feel that the patient doesn't follow-up with GI for further evaluation of possible gastritis. The patient will be discharged home. She did receive a liter of normal saline.       ____________________________________________   FINAL CLINICAL IMPRESSION(S) / ED DIAGNOSES  Final diagnoses:  Left upper quadrant pain  Acute gastritis without hemorrhage, unspecified gastritis type     ED Discharge Orders        Ordered    omeprazole (PRILOSEC) 40 MG capsule  Daily     11/27/16 0302    ondansetron (ZOFRAN ODT)  4 MG disintegrating tablet  Every 8 hours PRN     11/27/16 0302    famotidine (PEPCID) 40 MG tablet  Every evening     11/27/16 0302       Note:  This document was prepared using Dragon voice recognition software and may include unintentional dictation errors.    Rebecka Apley, MD 11/27/16 [phone]

## 2017-01-31 ENCOUNTER — Telehealth: Payer: Self-pay | Admitting: Obstetrics and Gynecology

## 2017-01-31 MED ORDER — FLUCONAZOLE 150 MG PO TABS: 150.0000 mg | ORAL_TABLET | Freq: Every day | ORAL | 0 refills | Status: DC

## 2017-01-31 NOTE — Telephone Encounter (Signed)
Pt has recently completed an ATB. She has had y/i sx x 1 week. Discharge, itching, and burning. No relief with  Monistat. NO new detergents or body wash. Diflucan erx. AE appt made for 2/4 at 9:00.

## 2017-01-31 NOTE — Telephone Encounter (Signed)
The patient called and stated that she is having problems with her most recent medication and would like a medication change, The patient would like to speak with a nurse soon, Please advise.

## 2017-02-07 ENCOUNTER — Emergency Department
Admission: EM | Admit: 2017-02-07 | Discharge: 2017-02-07 | Disposition: A | Discharge status: Home / Self Care | Payer: Self-pay | Attending: Emergency Medicine | Admitting: Emergency Medicine

## 2017-02-07 ENCOUNTER — Encounter: Payer: Self-pay | Admitting: Emergency Medicine

## 2017-02-07 ENCOUNTER — Other Ambulatory Visit: Payer: Self-pay

## 2017-02-07 ENCOUNTER — Emergency Department: Payer: Self-pay

## 2017-02-07 DIAGNOSIS — R112 Nausea with vomiting, unspecified: Secondary | ICD-10-CM | POA: Insufficient documentation

## 2017-02-07 DIAGNOSIS — N390 Urinary tract infection, site not specified: Secondary | ICD-10-CM | POA: Insufficient documentation

## 2017-02-07 DIAGNOSIS — Z79899 Other long term (current) drug therapy: Secondary | ICD-10-CM | POA: Insufficient documentation

## 2017-02-07 DIAGNOSIS — R319 Hematuria, unspecified: Secondary | ICD-10-CM | POA: Insufficient documentation

## 2017-02-07 DIAGNOSIS — R109 Unspecified abdominal pain: Secondary | ICD-10-CM

## 2017-02-07 DIAGNOSIS — F17210 Nicotine dependence, cigarettes, uncomplicated: Secondary | ICD-10-CM | POA: Insufficient documentation

## 2017-02-07 LAB — BASIC METABOLIC PANEL
ANION GAP: 9 (ref 5–15)
BUN: 15 mg/dL (ref 6–20)
CALCIUM: 9.3 mg/dL (ref 8.9–10.3)
CHLORIDE: 102 mmol/L (ref 101–111)
CO2: 28 mmol/L (ref 22–32)
CREATININE: 0.96 mg/dL (ref 0.44–1.00)
GFR calc non Af Amer: >60 mL/min (ref >60)
Glucose, Bld: 85 mg/dL (ref 65–99)
Potassium: 3.4 mmol/L — ABNORMAL LOW (ref 3.5–5.1)
SODIUM: 139 mmol/L (ref 135–145)

## 2017-02-07 LAB — CBC WITH DIFFERENTIAL/PLATELET
BASOS ABS: 0.1 10*3/uL (ref 0–0.1)
BASOS PCT: 1 %
EOS ABS: 0.1 10*3/uL (ref 0–0.7)
Eosinophils Relative: 1 %
HCT: 40.8 % (ref 35.0–47.0)
Hemoglobin: 13.7 g/dL (ref 12.0–16.0)
Lymphocytes Relative: 27 %
Lymphs Abs: 3.1 10*3/uL (ref 1.0–3.6)
MCH: 30.9 pg (ref 26.0–34.0)
MCHC: 33.5 g/dL (ref 32.0–36.0)
MCV: 92.1 fL (ref 80.0–100.0)
MONOS PCT: 8 %
Monocytes Absolute: 0.9 10*3/uL (ref 0.2–0.9)
NEUTROS PCT: 63 %
Neutro Abs: 7.2 10*3/uL — ABNORMAL HIGH (ref 1.4–6.5)
Platelets: 294 10*3/uL (ref 150–440)
RBC: 4.43 MIL/uL (ref 3.80–5.20)
RDW: 13.5 % (ref 11.5–14.5)
WBC: 11.4 10*3/uL — AB (ref 3.6–11.0)

## 2017-02-07 LAB — URINALYSIS, COMPLETE (UACMP) WITH MICROSCOPIC
BACTERIA UA: NONE SEEN
Glucose, UA: NEGATIVE mg/dL
KETONES UR: 5 mg/dL — AB
Nitrite: NEGATIVE
PH: 5 (ref 5.0–8.0)
PROTEIN: 100 mg/dL — AB
Specific Gravity, Urine: 1.033 — ABNORMAL HIGH (ref 1.005–1.030)

## 2017-02-07 MED ORDER — KETOROLAC TROMETHAMINE 30 MG/ML IJ SOLN
15.0000 mg | Freq: Once | INTRAMUSCULAR | Status: AC
  Administered 2017-02-07: 15 mg via INTRAVENOUS
  Filled 2017-02-07: qty 1

## 2017-02-07 MED ORDER — SODIUM CHLORIDE 0.9 % IV BOLUS (SEPSIS)
1000.0000 mL | INTRAVENOUS | Status: AC
  Administered 2017-02-07: 1000 mL via INTRAVENOUS

## 2017-02-07 MED ORDER — FENTANYL CITRATE (PF) 100 MCG/2ML IJ SOLN
50.0000 ug | Freq: Once | INTRAMUSCULAR | Status: AC
  Administered 2017-02-07: 50 ug via INTRAVENOUS
  Filled 2017-02-07: qty 2

## 2017-02-07 MED ORDER — TRAMADOL HCL 50 MG PO TABS: 50.0000 mg | ORAL_TABLET | Freq: Four times a day (QID) | ORAL | 0 refills | Status: DC | PRN

## 2017-02-07 MED ORDER — CEFTRIAXONE SODIUM IN DEXTROSE 20 MG/ML IV SOLN
1.0000 g | INTRAVENOUS | Status: AC
  Administered 2017-02-07: 1 g via INTRAVENOUS
  Filled 2017-02-07: qty 50

## 2017-02-07 MED ORDER — SULFAMETHOXAZOLE-TRIMETHOPRIM 800-160 MG PO TABS: 1.0000 | ORAL_TABLET | Freq: Two times a day (BID) | ORAL | 0 refills | Status: DC

## 2017-02-07 NOTE — ED Provider Notes (Signed)
[facility] Emergency Department Provider Note  ____________________________________________   First MD Initiated Contact with Patient 02/07/17 [phone]     (approximate)  I have reviewed the triage vital signs and the nursing notes.   HISTORY  Chief Complaint Dysuria    HPI TAYLYNN EASTON is a 52 y.o. female with medical history as listed below who presents for evaluation of about 3 days of flank pain that is bilateral but slightly worse on the left as well as having intermittent pain for several weeks to the right side.  She reports that what brought her in, however, was acute onset painful urination and increased urinary frequency overnight.  She saw some blood in her urine.  She has no history of kidney stones.  She describes the pain is severe.  Nothing makes it better or worse.  She denies fever/chills, chest pain, shortness of breath.  She has had some nausea and vomiting.  Past Medical History:  Diagnosis Date  . Anxiety   . Bipolar disorder (HCC)   . Depression   . Disc disorder   . Dyspareunia   . Endometriosis   . GERD (gastroesophageal reflux disease)   . Insomnia   . Lichen   . Surgical menopause   . Vaginal atrophy   . Wears glasses     Patient Active Problem List   Diagnosis Date Noted  . Acute respiratory failure (HCC)   . Perforated viscus 12/30/2014  . Lichen sclerosus 09/24/2014  . Surgical menopause 09/24/2014  . Endometriosis 09/24/2014  . Bipolar 1 disorder (HCC) 09/24/2014  . Insomnia 09/24/2014  . GERD (gastroesophageal reflux disease) 09/24/2014  . History of stomach ulcers 09/24/2014    Past Surgical History:  Procedure Laterality Date  . ABDOMINAL HYSTERECTOMY  1995   tah-bso  . ARM WOUND REPAIR / CLOSURE  2013   cellulitis rt arm-i/d  . KNEE BURSECTOMY Left 09/24/2013   Procedure: IRRIGATION AND DEBRIDEMENT OF LEFT KNEE ;  Surgeon: Sheral Apley, MD;  Location: [facility];  Service:  Orthopedics;  Laterality: Left;  . LAPAROTOMY N/A 12/30/2014   Procedure: EXPLORATORY LAPAROTOMY-graham patch of peptic ulcer;  Surgeon: Ricarda Frame, MD;  Location: [facility] OR[facility];  Service: General;  Laterality: N/A;    Prior to Admission medications   Medication Sig Start Date End Date Taking? Authorizing Provider  Atorvastatin Calcium (LIPITOR PO) Take 1 tablet by mouth daily.    [provider]  buPROPion (WELLBUTRIN SR) 150 MG 12 hr tablet Take 150 mg by mouth daily.    [provider]  busPIRone (BUSPAR) 10 MG tablet Take 20 mg by mouth 4 (four) times daily.     [provider]  celecoxib (CELEBREX) 100 MG capsule Take 1 capsule (100 mg total) by mouth 2 (two) times daily. 01/28/15   Ricarda Frame, MD  clonazePAM (KLONOPIN) 0.5 MG disintegrating tablet Take 1 mg by mouth daily as needed.     [provider]  conjugated estrogens (PREMARIN) vaginal cream Place 0.5 Applicatorfuls vaginally every Monday, Wednesday, and Friday. 03/04/16   Defrancesco, Prentice Docker, MD  cyclobenzaprine (FLEXERIL) 10 MG tablet Take 10 mg by mouth 3 (three) times daily as needed for muscle spasms.    [provider]  DULoxetine (CYMBALTA) 30 MG capsule Take 30 mg by mouth daily.    [provider]  estradiol (ESTRACE) 1 MG tablet Take 1.5 tablets (1.5 mg total) by mouth daily. 03/03/16   Defrancesco, Prentice Docker, MD  famotidine (  PEPCID) 40 MG tablet Take 1 tablet (40 mg total) every evening by mouth. 11/27/16 11/27/17  Rebecka ApleyWebster, Allison P, MD  fluconazole (DIFLUCAN) 150 MG tablet Take 1 tablet (150 mg total) by mouth daily. 01/31/17   Defrancesco, Prentice DockerMartin A, MD  metoCLOPramide (REGLAN) 10 MG tablet Take 1 tablet (10 mg total) by mouth every 8 (eight) hours as needed. 10/17/16   Rebecka ApleyWebster, Allison P, MD  mometasone (ELOCON) 0.1 % ointment Apply 1 application topically 2 (two) times a week. Patient not taking: Reported on 10/17/2016 03/03/16   Defrancesco, Prentice DockerMartin A, MD    omeprazole (PRILOSEC) 40 MG capsule Take 1 capsule (40 mg total) daily by mouth. 11/27/16 11/27/17  Rebecka ApleyWebster, Allison P, MD  ondansetron (ZOFRAN ODT) 4 MG disintegrating tablet Take 1 tablet (4 mg total) by mouth every 8 (eight) hours as needed for nausea or vomiting. 10/20/16   Nita SickleVeronese, [place], MD  ondansetron (ZOFRAN ODT) 4 MG disintegrating tablet Take 1 tablet (4 mg total) every 8 (eight) hours as needed by mouth for nausea or vomiting. 11/27/16   Rebecka ApleyWebster, Allison P, MD  oxyCODONE-acetaminophen (ROXICET) 5-325 MG tablet Take 1 tablet by mouth every 6 (six) hours as needed. 10/20/16 10/20/17  Nita SickleVeronese, [place], MD  pantoprazole (PROTONIX) 40 MG tablet Take 1 tablet (40 mg total) by mouth daily. 05/13/15   Gladis RiffleLoflin, Catherine L, MD  QUEtiapine (SEROQUEL XR) 300 MG 24 hr tablet Take 300 mg by mouth at bedtime.    [provider]  sucralfate (CARAFATE) 1 g tablet Take 1 tablet (1 g total) by mouth 2 (two) times daily. 10/17/16   Rebecka ApleyWebster, Allison P, MD    Allergies Hydrocodone; Aspirin; and Morphine and related  Family History  Problem Relation Age of Onset  . Diabetes Mother   . Diabetes Sister   . Cancer Father        Throat  . Heart disease Neg Hx     Social History Social History   Tobacco Use  . Smoking status: Current Every Day Smoker    Packs/day: 1.00    Types: Cigarettes  . Smokeless tobacco: Never Used  Substance Use Topics  . Alcohol use: No  . Drug use: No    Review of Systems Constitutional: No fever/chills Eyes: No visual changes. ENT: No sore throat. Cardiovascular: Denies chest pain. Respiratory: Denies shortness of breath. Gastrointestinal: Bilateral flank pain but worse in the left radiating into the abdomen.  Several episodes of vomiting with persistent nausea. Genitourinary: Dysuria and intermittent hematuria Musculoskeletal: Bilateral flanks but worse on the left Integumentary: Negative for rash. Neurological: Negative for headaches, focal  weakness or numbness. Psychiatric:Anxious  ____________________________________________   PHYSICAL EXAM:  VITAL SIGNS: ED Triage Vitals  Enc Vitals Group     BP 02/07/17 0502 (!) 149/73     Pulse Rate 02/07/17 0502 (!) 118     Resp 02/07/17 0502 20     Temp 02/07/17 0502 (!) 97.5 F (36.4 C)     Temp Source 02/07/17 0502 Oral     SpO2 02/07/17 0502 98 %     Weight 02/07/17 0458 73.9 kg (163 lb)     Height 02/07/17 0458 1.6 m (5\' 3" )     Head Circumference --      Peak Flow --      Pain Score 02/07/17 0458 7     Pain Loc --      Pain Edu? --      Excl. in GC? --     Constitutional: Alert  and oriented.  Related to the room and did not appear to be in any discomfort, when I saw her in the exam room she appeared to be in a significant amount of pain and was rolling from side to side and holding her back Eyes: Conjunctivae are normal.  Head: Atraumatic. Nose: No congestion/rhinnorhea. Mouth/Throat: Mucous membranes are moist. Neck: No stridor.  No meningeal signs.   Cardiovascular: Tachycardia, regular rhythm. Good peripheral circulation. Grossly normal heart sounds. Respiratory: Normal respiratory effort.  No retractions. Lungs CTAB. Gastrointestinal: Mild diffuse tenderness throughout the abdomen.  No distention. Musculoskeletal: Bilateral CVA tenderness but worse on the left as well as some lower back tenderness to palpation Neurologic:  Normal speech and language. No gross focal neurologic deficits are appreciated.  Skin:  Skin is warm, dry and intact. No rash noted. Psychiatric: Mood and affect are anxious but essentially normal  ____________________________________________   LABS (all labs ordered are listed, but only abnormal results are displayed)  Labs Reviewed  URINALYSIS, COMPLETE (UACMP) WITH MICROSCOPIC - Abnormal; Notable for the following components:      Result Value   Color, Urine YELLOW (*)    APPearance TURBID (*)    Specific Gravity, Urine 1.033 (*)     Hgb urine dipstick LARGE (*)    Bilirubin Urine SMALL (*)    Ketones, ur 5 (*)    Protein, ur 100 (*)    Leukocytes, UA MODERATE (*)    Squamous Epithelial / LPF 0-5 (*)    Non Squamous Epithelial 0-5 (*)    All other components within normal limits  CBC WITH DIFFERENTIAL/PLATELET - Abnormal; Notable for the following components:   WBC 11.4 (*)    Neutro Abs 7.2 (*)    All other components within normal limits  BASIC METABOLIC PANEL - Abnormal; Notable for the following components:   Potassium 3.4 (*)    All other components within normal limits  URINE CULTURE   ____________________________________________  EKG  None - EKG not ordered by ED physician ____________________________________________  RADIOLOGY   No results found.  ____________________________________________   PROCEDURES  Critical Care performed: No   Procedure(s) performed:   Procedures   ____________________________________________   INITIAL IMPRESSION / ASSESSMENT AND PLAN / ED COURSE  As part of my medical decision making, I reviewed the following data within the electronic MEDICAL RECORD NUMBER Nursing notes reviewed and incorporated, Labs reviewed , Old chart reviewed and Notes from prior ED visits   Differential diagnosis includes, but is not limited to, UTI, ureterolithiasis, pyelonephritis, MSK pain, etc.  Patient has had multiple ER visits for various pain issues but never flank pain and she has no reported history of kidney stones.  Her urine does appear infected and has hematuria. Given the morbidity/mortality associated with a missed diagnosis of infected stone, I will obtain a CT scan of her abdomen pelvis to attempt to identify both the presence of a stone as well as any sign of pyelonephritis.  I am treating with ceftriaxone 1 g IV, fentanyl 50 mcg IV, , and Toradol 15 mg IV.    Transferring ED care to Dr. Cyril Loosen to follow up results and reassess.      ____________________________________________  FINAL CLINICAL IMPRESSION(S) / ED DIAGNOSES  Final diagnoses:  Bilateral flank pain  Urinary tract infection with hematuria, site unspecified  Nausea and vomiting, intractability of vomiting not specified, unspecified vomiting type     MEDICATIONS GIVEN DURING THIS VISIT:  Medications  sodium chloride 0.9 % bolus  1,000 mL (1,000 mLs Intravenous New Bag/Given 02/07/17 0625)  cefTRIAXone (ROCEPHIN) 1 g in dextrose 5 % 50 mL IVPB - Premix (1 g Intravenous New Bag/Given 02/07/17 0636)  ketorolac (TORADOL) 30 MG/ML injection 15 mg (15 mg Intravenous Given 02/07/17 0625)  fentaNYL (SUBLIMAZE) injection 50 mcg (50 mcg Intravenous Given 02/07/17 1610)     ED Discharge Orders    None       Note:  This document was prepared using Dragon voice recognition software and may include unintentional dictation errors.    Loleta Rose, MD 02/07/17 [phone]

## 2017-02-07 NOTE — ED Notes (Signed)
ED Provider at bedside. 

## 2017-02-07 NOTE — ED Notes (Addendum)
Pt states bilateral flank pain x3 days to this RN, states a few weeks right sided to MD. Pt reports urinary frequency and pain x1day. Denies hx kidney stones

## 2017-02-07 NOTE — ED Notes (Signed)
Patient transported to CT 

## 2017-02-07 NOTE — ED Triage Notes (Signed)
Patient ambulatory to triage with steady gait, without difficulty or distress noted; pt reports dysuria tonight; denies abd pain

## 2017-02-07 NOTE — ED Provider Notes (Signed)
CT negative for stone. Patient feeling better. Ok for discharge at this point   Brandy Robinson, Lang Zingg, MD 02/07/17 [phone]

## 2017-02-08 LAB — URINE CULTURE: Special Requests: NORMAL

## 2017-02-20 ENCOUNTER — Encounter: Payer: Self-pay | Admitting: Obstetrics and Gynecology

## 2017-02-24 ENCOUNTER — Other Ambulatory Visit: Payer: Self-pay | Admitting: Obstetrics and Gynecology

## 2017-03-18 ENCOUNTER — Inpatient Hospital Stay
Admission: EM | Admit: 2017-03-18 | Discharge: 2017-03-25 | DRG: 871 | Disposition: A | Discharge status: Home / Self Care | Payer: Self-pay | Attending: Internal Medicine | Admitting: Internal Medicine

## 2017-03-18 ENCOUNTER — Emergency Department: Payer: Self-pay

## 2017-03-18 ENCOUNTER — Other Ambulatory Visit: Payer: Self-pay

## 2017-03-18 DIAGNOSIS — E669 Obesity, unspecified: Secondary | ICD-10-CM | POA: Diagnosis present

## 2017-03-18 DIAGNOSIS — Z833 Family history of diabetes mellitus: Secondary | ICD-10-CM

## 2017-03-18 DIAGNOSIS — R600 Localized edema: Secondary | ICD-10-CM

## 2017-03-18 DIAGNOSIS — Z808 Family history of malignant neoplasm of other organs or systems: Secondary | ICD-10-CM

## 2017-03-18 DIAGNOSIS — J449 Chronic obstructive pulmonary disease, unspecified: Secondary | ICD-10-CM

## 2017-03-18 DIAGNOSIS — Z8711 Personal history of peptic ulcer disease: Secondary | ICD-10-CM

## 2017-03-18 DIAGNOSIS — J9621 Acute and chronic respiratory failure with hypoxia: Secondary | ICD-10-CM | POA: Diagnosis present

## 2017-03-18 DIAGNOSIS — F17210 Nicotine dependence, cigarettes, uncomplicated: Secondary | ICD-10-CM | POA: Diagnosis present

## 2017-03-18 DIAGNOSIS — N809 Endometriosis, unspecified: Secondary | ICD-10-CM | POA: Diagnosis present

## 2017-03-18 DIAGNOSIS — Z973 Presence of spectacles and contact lenses: Secondary | ICD-10-CM

## 2017-03-18 DIAGNOSIS — J441 Chronic obstructive pulmonary disease with (acute) exacerbation: Secondary | ICD-10-CM | POA: Diagnosis present

## 2017-03-18 DIAGNOSIS — F410 Panic disorder [episodic paroxysmal anxiety] without agoraphobia: Secondary | ICD-10-CM | POA: Diagnosis present

## 2017-03-18 DIAGNOSIS — K760 Fatty (change of) liver, not elsewhere classified: Secondary | ICD-10-CM | POA: Diagnosis present

## 2017-03-18 DIAGNOSIS — A419 Sepsis, unspecified organism: Principal | ICD-10-CM

## 2017-03-18 DIAGNOSIS — Z792 Long term (current) use of antibiotics: Secondary | ICD-10-CM

## 2017-03-18 DIAGNOSIS — R0902 Hypoxemia: Secondary | ICD-10-CM

## 2017-03-18 DIAGNOSIS — Z79891 Long term (current) use of opiate analgesic: Secondary | ICD-10-CM

## 2017-03-18 DIAGNOSIS — F319 Bipolar disorder, unspecified: Secondary | ICD-10-CM | POA: Diagnosis present

## 2017-03-18 DIAGNOSIS — J440 Chronic obstructive pulmonary disease with acute lower respiratory infection: Secondary | ICD-10-CM | POA: Diagnosis present

## 2017-03-18 DIAGNOSIS — G4700 Insomnia, unspecified: Secondary | ICD-10-CM | POA: Diagnosis present

## 2017-03-18 DIAGNOSIS — E876 Hypokalemia: Secondary | ICD-10-CM | POA: Diagnosis present

## 2017-03-18 DIAGNOSIS — Z79899 Other long term (current) drug therapy: Secondary | ICD-10-CM

## 2017-03-18 DIAGNOSIS — F411 Generalized anxiety disorder: Secondary | ICD-10-CM | POA: Diagnosis present

## 2017-03-18 DIAGNOSIS — R1011 Right upper quadrant pain: Secondary | ICD-10-CM

## 2017-03-18 DIAGNOSIS — J189 Pneumonia, unspecified organism: Secondary | ICD-10-CM

## 2017-03-18 DIAGNOSIS — I82409 Acute embolism and thrombosis of unspecified deep veins of unspecified lower extremity: Secondary | ICD-10-CM

## 2017-03-18 DIAGNOSIS — M25511 Pain in right shoulder: Secondary | ICD-10-CM | POA: Diagnosis present

## 2017-03-18 DIAGNOSIS — I5023 Acute on chronic systolic (congestive) heart failure: Secondary | ICD-10-CM | POA: Diagnosis present

## 2017-03-18 DIAGNOSIS — J181 Lobar pneumonia, unspecified organism: Secondary | ICD-10-CM | POA: Diagnosis present

## 2017-03-18 DIAGNOSIS — Z6831 Body mass index (BMI) 31.0-31.9, adult: Secondary | ICD-10-CM

## 2017-03-18 DIAGNOSIS — W19XXXA Unspecified fall, initial encounter: Secondary | ICD-10-CM | POA: Diagnosis present

## 2017-03-18 DIAGNOSIS — K219 Gastro-esophageal reflux disease without esophagitis: Secondary | ICD-10-CM | POA: Diagnosis present

## 2017-03-18 LAB — LIPASE, BLOOD: Lipase: 17 U/L (ref 11–51)

## 2017-03-18 LAB — BASIC METABOLIC PANEL
ANION GAP: 11 (ref 5–15)
BUN: 8 mg/dL (ref 6–20)
CO2: 22 mmol/L (ref 22–32)
Calcium: 8.4 mg/dL — ABNORMAL LOW (ref 8.9–10.3)
Chloride: 101 mmol/L (ref 101–111)
Creatinine, Ser: 0.89 mg/dL (ref 0.44–1.00)
GFR calc Af Amer: >60 mL/min (ref >60)
GLUCOSE: 111 mg/dL — AB (ref 65–99)
Potassium: 3 mmol/L — ABNORMAL LOW (ref 3.5–5.1)
SODIUM: 134 mmol/L — AB (ref 135–145)

## 2017-03-18 LAB — CBC
HCT: 35.8 % (ref 35.0–47.0)
HEMOGLOBIN: 12.3 g/dL (ref 12.0–16.0)
MCH: 30.7 pg (ref 26.0–34.0)
MCHC: 34.3 g/dL (ref 32.0–36.0)
MCV: 89.7 fL (ref 80.0–100.0)
Platelets: 256 10*3/uL (ref 150–440)
RBC: 3.99 MIL/uL (ref 3.80–5.20)
RDW: 14 % (ref 11.5–14.5)
WBC: 16.2 10*3/uL — ABNORMAL HIGH (ref 3.6–11.0)

## 2017-03-18 LAB — INFLUENZA PANEL BY PCR (TYPE A & B)
INFLAPCR: NEGATIVE
INFLBPCR: NEGATIVE

## 2017-03-18 MED ORDER — HALOPERIDOL LACTATE 5 MG/ML IJ SOLN
5.0000 mg | Freq: Once | INTRAMUSCULAR | Status: AC
  Administered 2017-03-18: 5 mg via INTRAVENOUS

## 2017-03-18 MED ORDER — SODIUM CHLORIDE 0.9 % IV BOLUS (SEPSIS)
1000.0000 mL | Freq: Once | INTRAVENOUS | Status: AC
  Administered 2017-03-18: 1000 mL via INTRAVENOUS

## 2017-03-18 MED ORDER — ONDANSETRON HCL 4 MG/2ML IJ SOLN
4.0000 mg | Freq: Once | INTRAMUSCULAR | Status: AC | PRN
  Administered 2017-03-18: 4 mg via INTRAVENOUS

## 2017-03-18 MED ORDER — IOPAMIDOL (ISOVUE-370) INJECTION 76%
75.0000 mL | Freq: Once | INTRAVENOUS | Status: AC | PRN
  Administered 2017-03-18: 75 mL via INTRAVENOUS

## 2017-03-18 MED ORDER — ONDANSETRON HCL 4 MG/2ML IJ SOLN
INTRAMUSCULAR | Status: AC
  Administered 2017-03-18: 4 mg via INTRAVENOUS
  Filled 2017-03-18: qty 2

## 2017-03-18 MED ORDER — SODIUM CHLORIDE 0.9 % IV SOLN
500.0000 mg | Freq: Once | INTRAVENOUS | Status: DC
  Administered 2017-03-19: 500 mg via INTRAVENOUS
  Filled 2017-03-18: qty 500

## 2017-03-18 MED ORDER — LEVOFLOXACIN 750 MG PO TABS: 750.0000 mg | ORAL_TABLET | Freq: Once | ORAL | Status: DC

## 2017-03-18 MED ORDER — HALOPERIDOL LACTATE 5 MG/ML IJ SOLN
INTRAMUSCULAR | Status: AC
  Administered 2017-03-18: 5 mg via INTRAVENOUS
  Filled 2017-03-18: qty 1

## 2017-03-18 MED ORDER — KETOROLAC TROMETHAMINE 30 MG/ML IJ SOLN
15.0000 mg | INTRAMUSCULAR | Status: AC
  Administered 2017-03-18: 15 mg via INTRAVENOUS
  Filled 2017-03-18: qty 1

## 2017-03-18 MED ORDER — PROCHLORPERAZINE EDISYLATE 5 MG/ML IJ SOLN
10.0000 mg | Freq: Once | INTRAMUSCULAR | Status: AC
  Administered 2017-03-18: 10 mg via INTRAVENOUS
  Filled 2017-03-18: qty 2

## 2017-03-18 MED ORDER — FENTANYL CITRATE (PF) 100 MCG/2ML IJ SOLN
75.0000 ug | Freq: Once | INTRAMUSCULAR | Status: AC
  Administered 2017-03-18: 75 ug via INTRAVENOUS
  Filled 2017-03-18: qty 2

## 2017-03-18 MED ORDER — SODIUM CHLORIDE 0.9 % IV BOLUS (SEPSIS)
1000.0000 mL | Freq: Once | INTRAVENOUS | Status: AC
Start: 2017-03-18 — End: 2017-03-18
  Administered 2017-03-18: 1000 mL via INTRAVENOUS

## 2017-03-18 MED ORDER — SODIUM CHLORIDE 0.9 % IV SOLN
1.0000 g | Freq: Once | INTRAVENOUS | Status: AC
  Administered 2017-03-18: 1 g via INTRAVENOUS
  Filled 2017-03-18: qty 10

## 2017-03-18 NOTE — ED Notes (Signed)
US tech at bedside to perform procedure

## 2017-03-18 NOTE — ED Provider Notes (Signed)
Clinical Course as of Mar 19 2311  Sat Mar 18, 2017  2209 Results discussed with patient. CT reveals a right middle lobe pneumonia. In light of her tachycardia, tachypnea, hypoxia, leukocytosis, this qualifies as sepsis. She is not in shock. However, I recommended admission for IV antibiotics, the patient refuses. She was to go home tonight, understanding that this may get much worse requiring her to call 911 for return to the hospital. I discussed return precautions with her and will plan to discharge her against medical advise on antibiotics. She has medical decision-making capacity.  [PS]    Clinical Course User Index [PS] Sharman CheekStafford, Kevin Mario, MD    ----------------------------------------- 11:13 PM on 03/18/2017 -----------------------------------------  On further conversation, patient's agreeable to hospitalization. I discussed with hospitalist. Antibiotic selection of ceftriaxone and azithromycin. Check lactate and blood cultures. I initially did not check lactate and blood cultures upon discovering that the patient had infection and that this is in fact sepsis based on the CT scan result to 10 PM, due to the patient wanting to be discharged AGAINST MEDICAL ADVICE. We'll proceed with symptomatic management at this time.  Final diagnoses:  RUQ pain  Pneumonia of right middle lobe due to infectious organism [facility](HCC)  Sepsis, due to unspecified organism [facility](HCC)       Sharman CheekStafford, Ivann Trimarco, MD 03/18/17 2315

## 2017-03-18 NOTE — ED Provider Notes (Addendum)
[facility] Emergency Department Provider Note   ____________________________________________   I have reviewed the triage vital signs and the nursing notes.   HISTORY  Chief Complaint Abdominal pain  History limited by: Not Limited   HPI Brandy Robinson is a 52 y.o. female who presents to the emergency department today with primary complaint of abdominal pain. Started last night after a fall. Larey SeatFell to the ground after she tripped. Since then she has had severe pain in the right lower ribs/upper abdomen. The patient has had accompanied nausea and vomiting. Has felt dizzy. Has had pain to her right shoulder. The patient denies any fevers.    Per medical record review patient has a history of GERD.  Past Medical History:  Diagnosis Date  . Anxiety   . Bipolar disorder (HCC)   . Depression   . Disc disorder   . Dyspareunia   . Endometriosis   . GERD (gastroesophageal reflux disease)   . Insomnia   . Lichen   . Surgical menopause   . Vaginal atrophy   . Wears glasses     Patient Active Problem List   Diagnosis Date Noted  . Acute respiratory failure (HCC)   . Perforated viscus 12/30/2014  . Lichen sclerosus 09/24/2014  . Surgical menopause 09/24/2014  . Endometriosis 09/24/2014  . Bipolar 1 disorder (HCC) 09/24/2014  . Insomnia 09/24/2014  . GERD (gastroesophageal reflux disease) 09/24/2014  . History of stomach ulcers 09/24/2014    Past Surgical History:  Procedure Laterality Date  . ABDOMINAL HYSTERECTOMY  1995   tah-bso  . ARM WOUND REPAIR / CLOSURE  2013   cellulitis rt arm-i/d  . KNEE BURSECTOMY Left 09/24/2013   Procedure: IRRIGATION AND DEBRIDEMENT OF LEFT KNEE ;  Surgeon: Sheral Apleyimothy D Murphy, MD;  Location: [facility];  Service: Orthopedics;  Laterality: Left;  . LAPAROTOMY N/A 12/30/2014   Procedure: EXPLORATORY LAPAROTOMY-graham patch of peptic ulcer;  Surgeon: Ricarda Frameharles Woodham, MD;  Location: [facility] ORS;  Service:  General;  Laterality: N/A;    Prior to Admission medications   Medication Sig Start Date End Date Taking? Authorizing Provider  buPROPion (WELLBUTRIN SR) 150 MG 12 hr tablet Take 150 mg by mouth daily.    [provider]  busPIRone (BUSPAR) 10 MG tablet Take 20 mg by mouth 4 (four) times daily.     [provider]  celecoxib (CELEBREX) 100 MG capsule Take 1 capsule (100 mg total) by mouth 2 (two) times daily. 01/28/15   Ricarda FrameWoodham, Charles, MD  clonazePAM (KLONOPIN) 0.5 MG disintegrating tablet Take 0.5 mg by mouth daily as needed.     [provider]  conjugated estrogens (PREMARIN) vaginal cream Place 0.5 Applicatorfuls vaginally every Monday, Wednesday, and Friday. 03/04/16   Defrancesco, Prentice DockerMartin A, MD  cyclobenzaprine (FLEXERIL) 10 MG tablet Take 10 mg by mouth 2 (two) times daily.     [provider]  DULoxetine (CYMBALTA) 30 MG capsule Take 30 mg by mouth 3 (three) times daily.     [provider]  estradiol (ESTRACE) 1 MG tablet TAKE 1 1/2 (1.5MG ) BY MOUTH EVERY DAY 02/27/17   Defrancesco, Prentice DockerMartin A, MD  famotidine (PEPCID) 40 MG tablet Take 1 tablet (40 mg total) every evening by mouth. Patient not taking: Reported on 02/07/2017 11/27/16 11/27/17  Rebecka ApleyWebster, Allison P, MD  fluconazole (DIFLUCAN) 150 MG tablet Take 1 tablet (150 mg total) by mouth daily. Patient not taking: Reported on 02/07/2017 01/31/17   Defrancesco, Prentice DockerMartin A, MD  metoCLOPramide (REGLAN) 10 MG tablet Take 1 tablet (10 mg total) by mouth every 8 (eight) hours as needed. Patient not taking: Reported on 02/07/2017 10/17/16   Rebecka Apley, MD  mometasone (ELOCON) 0.1 % ointment Apply 1 application topically 2 (two) times a week. Patient not taking: Reported on 10/17/2016 03/03/16   Defrancesco, Prentice Docker, MD  omeprazole (PRILOSEC) 40 MG capsule Take 1 capsule (40 mg total) daily by mouth. Patient not taking: Reported on 02/07/2017 11/27/16 11/27/17  Rebecka Apley, MD  ondansetron  (ZOFRAN ODT) 4 MG disintegrating tablet Take 1 tablet (4 mg total) by mouth every 8 (eight) hours as needed for nausea or vomiting. 10/20/16   Nita Sickle, MD  ondansetron (ZOFRAN ODT) 4 MG disintegrating tablet Take 1 tablet (4 mg total) every 8 (eight) hours as needed by mouth for nausea or vomiting. Patient not taking: Reported on 02/07/2017 11/27/16   Rebecka Apley, MD  oxyCODONE-acetaminophen (ROXICET) 5-325 MG tablet Take 1 tablet by mouth every 6 (six) hours as needed. Patient not taking: Reported on 02/07/2017 10/20/16 10/20/17  Nita Sickle, MD  pantoprazole (PROTONIX) 40 MG tablet Take 1 tablet (40 mg total) by mouth daily. 05/13/15   Gladis Riffle, MD  QUEtiapine (SEROQUEL XR) 400 MG 24 hr tablet Take by mouth at bedtime.     [provider]  sucralfate (CARAFATE) 1 g tablet Take 1 tablet (1 g total) by mouth 2 (two) times daily. Patient not taking: Reported on 02/07/2017 10/17/16   Rebecka Apley, MD  sulfamethoxazole-trimethoprim (BACTRIM DS,SEPTRA DS) 800-160 MG tablet Take 1 tablet by mouth 2 (two) times daily. 02/07/17   Jene Every, MD  traMADol (ULTRAM) 50 MG tablet Take 1 tablet (50 mg total) by mouth every 6 (six) hours as needed. 02/07/17 02/07/18  Jene Every, MD    Allergies Hydrocodone; Aspirin; and Morphine and related  Family History  Problem Relation Age of Onset  . Diabetes Mother   . Diabetes Sister   . Cancer Father        Throat  . Heart disease Neg Hx     Social History Social History   Tobacco Use  . Smoking status: Current Every Day Smoker    Packs/day: 1.00    Types: Cigarettes  . Smokeless tobacco: Never Used  Substance Use Topics  . Alcohol use: No  . Drug use: No    Review of Systems Constitutional: No fever/chills Eyes: No visual changes. ENT: No sore throat. Cardiovascular: Denies chest pain. Respiratory: Denies shortness of breath. Gastrointestinal: Positive for right upper quadrant abdominal  pain. Genitourinary: Negative for dysuria. Musculoskeletal: Negative for back pain. Skin: Negative for rash. Neurological: Negative for headaches, focal weakness or numbness.  ____________________________________________   PHYSICAL EXAM:  VITAL SIGNS: ED Triage Vitals [03/18/17 1637]  Enc Vitals Group     BP (!) 148/47     Pulse Rate (!) 149     Resp 18     Temp 100 F (37.8 C)     Temp Source Oral     SpO2 96 %     Weight 162 lb (73.5 kg)     Height 5\' 3"  (1.6 m)     Head Circumference      Peak Flow      Pain Score 10   Constitutional: Alert and oriented. Well appearing and in no distress. Eyes: Conjunctivae are normal.  ENT   Head: Normocephalic and atraumatic.   Nose: No congestion/rhinnorhea.   Mouth/Throat: Mucous membranes  are moist.   Neck: No stridor. Hematological/Lymphatic/Immunilogical: No cervical lymphadenopathy. Cardiovascular: Tachycardic, regular rhythm.  No murmurs, rubs, or gallops.  Respiratory: Normal respiratory effort without tachypnea nor retractions. Breath sounds are clear and equal bilaterally. No wheezes/rales/rhonchi. Gastrointestinal: Soft and tender in palpation in the right upper quadrant.  Genitourinary: Deferred Musculoskeletal: Normal range of motion in all extremities. No lower extremity edema. Neurologic:  Normal speech and language. No gross focal neurologic deficits are appreciated.  Skin:  Skin is warm, dry and intact. No rash noted. Psychiatric: Mood and affect are normal. Speech and behavior are normal. Patient exhibits appropriate insight and judgment.  ____________________________________________    LABS (pertinent positives/negatives)  Influenza negative Lipase 17 CBC wbc 16.2, hgb 12.3, plt 256 BMP na 134, k 3.0, cr 0.89  ____________________________________________   EKG  I, Phineas Semen, attending physician, personally viewed and interpreted this EKG  EKG Time: 1649 Rate: 140 Rhythm: sinus  tachycardia Axis: normal Intervals: qtc 534 QRS: narrow ST changes: no st elevation Impression: abnormal ekg   ____________________________________________    RADIOLOGY  RUQ [name] No evidence of gallbladder disease  CT angio ____________________________________________   PROCEDURES  Procedures  ____________________________________________   INITIAL IMPRESSION / ASSESSMENT AND PLAN / ED COURSE  Pertinent labs & imaging results that were available during my care of the patient were reviewed by me and considered in my medical decision making (see chart for details).  Presented to the emergency department today with concerns for right upper quadrant right lower chest pain.  Exam patient is tender over the right upper quadrant.  Blood work did show a leukocytosis of 16.2 concerning for possible intra-abdominal infection.  Patient underwent ultrasound which did not show any obvious gallbladder pathology. Patient did have episode of hypoxia raising concern for possible PE or lung disease. Will get CT angio. Discussed with patient [name] results and plan of CT.  ____________________________________________   FINAL CLINICAL IMPRESSION(S) / ED DIAGNOSES  RUQ pain Hypoxia  Note: This dictation was prepared with Dragon dictation. Any transcriptional errors that result from this process are unintentional     Phineas Semen, MD 03/19/17 1610    Phineas Semen, MD 03/19/17 [phone]

## 2017-03-18 NOTE — ED Notes (Signed)
Dr Derrill KayGoodman informed of pt's continued pain of 9/10 after haldol

## 2017-03-18 NOTE — H&P (Signed)
[facility] - [place] at [facility]   PATIENT NAME: Albertha Beattie    MR#:  [id number]  DATE OF BIRTH:  1965-06-23  DATE OF ADMISSION:  03/18/2017  PRIMARY CARE PHYSICIAN: [facility]   REQUESTING/REFERRING PHYSICIAN:   CHIEF COMPLAINT:   Chief Complaint  Patient presents with  . Dizziness    HISTORY OF PRESENT ILLNESS: Jahdai Padovano  is a 52 y.o. female with a known history of bipolar disorder, anxiety and depression disorder, insomnia. Patient presented to emergency room for acute onset of nausea, vomiting and diffuse abdominal pain that started in the past 2 days, gradually getting worse.  She has been so weak that she even had a fall at home, yesterday.  Patient denies any chest pain or shortness of breath, no fever or chills no bleeding. Upon evaluation in the emergency room she was found hypoxic tachypneic and tachycardic.  Oxygen saturation was 83% at the arrival to emergency room.  Blood tests are notable for elevated WBC at 16.2 and low magnesium and potassium levels.  Lactic acid, within normal limits. CAT scan of the chest and the abdomen, reviewed by myself, are remarkable for right middle lobe infiltrate and fatty liver. Patient is admitted for further evaluation and treatment.  PAST MEDICAL HISTORY:   Past Medical History:  Diagnosis Date  . Anxiety   . Bipolar disorder (HCC)   . Depression   . Disc disorder   . Dyspareunia   . Endometriosis   . GERD (gastroesophageal reflux disease)   . Insomnia   . Lichen   . Surgical menopause   . Vaginal atrophy   . Wears glasses     PAST SURGICAL HISTORY:  Past Surgical History:  Procedure Laterality Date  . ABDOMINAL HYSTERECTOMY  1995   tah-bso  . ARM WOUND REPAIR / CLOSURE  2013   cellulitis rt arm-i/d  . KNEE BURSECTOMY Left 09/24/2013   Procedure: IRRIGATION AND DEBRIDEMENT OF LEFT KNEE ;  Surgeon: Sheral Apley, MD;  Location: [facility];  Service:  Orthopedics;  Laterality: Left;  . LAPAROTOMY N/A 12/30/2014   Procedure: EXPLORATORY LAPAROTOMY-graham patch of peptic ulcer;  Surgeon: Ricarda Frame, MD;  Location: [facility] ORS;  Service: General;  Laterality: N/A;    SOCIAL HISTORY:  Social History   Tobacco Use  . Smoking status: Current Every Day Smoker    Packs/day: 1.00    Types: Cigarettes  . Smokeless tobacco: Never Used  Substance Use Topics  . Alcohol use: No    FAMILY HISTORY:  Family History  Problem Relation Age of Onset  . Diabetes Mother   . Diabetes Sister   . Cancer Father        Throat  . Heart disease Neg Hx     DRUG ALLERGIES:  Allergies  Allergen Reactions  . Hydrocodone Itching  . Aspirin Other (See Comments)    Reaction:  GI upset   . Morphine And Related Itching, Nausea And Vomiting and Other (See Comments)    Reaction:  Chest pain    REVIEW OF SYSTEMS:   CONSTITUTIONAL: No fever/chills, but patient complains of fatigue and generalized weakness.  EYES: No blurred or double vision.  EARS, NOSE, AND THROAT: No tinnitus or ear pain.  RESPIRATORY: No cough, shortness of breath, wheezing or hemoptysis.  CARDIOVASCULAR: No chest pain, orthopnea, edema.  GASTROINTESTINAL: Positive for nausea, vomiting and abdominal pain.  GENITOURINARY: No dysuria, hematuria.  ENDOCRINE: No polyuria, nocturia,  HEMATOLOGY: No bleeding  SKIN: No rash or lesion. MUSCULOSKELETAL: She complains of generalized aches and pains.   NEUROLOGIC: No "weakness.  PSYCHIATRY: Positive history of anxiety, depression and bipolar disorder.   MEDICATIONS AT HOME:  Prior to Admission medications   Medication Sig Start Date End Date Taking? Authorizing Provider  buPROPion (WELLBUTRIN SR) 150 MG 12 hr tablet Take 150 mg by mouth daily.   Yes [provider]  busPIRone (BUSPAR) 10 MG tablet Take 10 mg by mouth 4 (four) times daily.    Yes [provider]  clonazePAM (KLONOPIN) 0.5 MG tablet Take 0.5 mg by mouth  daily as needed.    Yes [provider]  cyclobenzaprine (FLEXERIL) 10 MG tablet Take 10 mg by mouth as needed for muscle spasms.    Yes [provider]  DULoxetine (CYMBALTA) 30 MG capsule Take 30 mg by mouth at bedtime.    Yes [provider]  estradiol (ESTRACE) 1 MG tablet TAKE 1 1/2 (1.5MG ) BY MOUTH EVERY DAY 02/27/17  Yes Defrancesco, Prentice Docker, MD  pantoprazole (PROTONIX) 40 MG tablet Take 1 tablet (40 mg total) by mouth daily. 05/13/15  Yes Loflin, Laney Potash, MD  QUEtiapine (SEROQUEL) 300 MG tablet Take 300 mg by mouth at bedtime.   Yes [provider]  celecoxib (CELEBREX) 100 MG capsule Take 1 capsule (100 mg total) by mouth 2 (two) times daily. Patient not taking: Reported on 03/18/2017 01/28/15   Ricarda Frame, MD  conjugated estrogens (PREMARIN) vaginal cream Place 0.5 Applicatorfuls vaginally every Monday, Wednesday, and Friday. Patient not taking: Reported on 03/18/2017 03/04/16   Defrancesco, Prentice Docker, MD  famotidine (PEPCID) 40 MG tablet Take 1 tablet (40 mg total) every evening by mouth. Patient not taking: Reported on 02/07/2017 11/27/16 11/27/17  Rebecka Apley, MD  fluconazole (DIFLUCAN) 150 MG tablet Take 1 tablet (150 mg total) by mouth daily. Patient not taking: Reported on 02/07/2017 01/31/17   Defrancesco, Prentice Docker, MD  metoCLOPramide (REGLAN) 10 MG tablet Take 1 tablet (10 mg total) by mouth every 8 (eight) hours as needed. Patient not taking: Reported on 02/07/2017 10/17/16   Rebecka Apley, MD  mometasone (ELOCON) 0.1 % ointment Apply 1 application topically 2 (two) times a week. Patient not taking: Reported on 10/17/2016 03/03/16   Defrancesco, Prentice Docker, MD  omeprazole (PRILOSEC) 40 MG capsule Take 1 capsule (40 mg total) daily by mouth. Patient not taking: Reported on 02/07/2017 11/27/16 11/27/17  Rebecka Apley, MD  ondansetron (ZOFRAN ODT) 4 MG disintegrating tablet Take 1 tablet (4 mg total) by mouth every 8 (eight) hours as  needed for nausea or vomiting. Patient not taking: Reported on 03/18/2017 10/20/16   Nita Sickle, MD  ondansetron (ZOFRAN ODT) 4 MG disintegrating tablet Take 1 tablet (4 mg total) every 8 (eight) hours as needed by mouth for nausea or vomiting. Patient not taking: Reported on 02/07/2017 11/27/16   Rebecka Apley, MD  oxyCODONE-acetaminophen (ROXICET) 5-325 MG tablet Take 1 tablet by mouth every 6 (six) hours as needed. Patient not taking: Reported on 02/07/2017 10/20/16 10/20/17  Nita Sickle, MD  QUEtiapine (SEROQUEL XR) 400 MG 24 hr tablet Take by mouth at bedtime.     [provider]  sucralfate (CARAFATE) 1 g tablet Take 1 tablet (1 g total) by mouth 2 (two) times daily. Patient not taking: Reported on 02/07/2017 10/17/16   Rebecka Apley, MD  sulfamethoxazole-trimethoprim (BACTRIM DS,SEPTRA DS) 800-160 MG tablet Take 1 tablet by mouth 2 (two) times daily. Patient  not taking: Reported on 03/18/2017 02/07/17   Jene EveryKinner, Robert, MD  traMADol (ULTRAM) 50 MG tablet Take 1 tablet (50 mg total) by mouth every 6 (six) hours as needed. Patient not taking: Reported on 03/18/2017 02/07/17 02/07/18  Jene EveryKinner, Robert, MD      PHYSICAL EXAMINATION:   VITAL SIGNS: Blood pressure 106/70, pulse (!) 134, temperature 98.9 F (37.2 C), temperature source Oral, resp. rate (!) 24, height 5\' 3"  (1.6 m), weight 73.5 kg (162 lb), SpO2 92 %.  GENERAL:  52 y.o.-year-old patient lying in the bed.  She looks acutely ill, lethargic. EYES: Pupils equal, round, reactive to light and accommodation. No scleral icterus. Extraocular muscles intact.  HEENT: Head atraumatic, normocephalic. Oropharynx and nasopharynx clear.  NECK:  Supple, no jugular venous distention. No thyroid enlargement, no tenderness.  LUNGS: Reduced breath sounds bilaterally, no wheezing, but rhonchi noted at the right lung area. No use of accessory muscles of respiration.  CARDIOVASCULAR: S1, S2 normal. No S3/S4.  ABDOMEN: Soft,  nontender, nondistended. Bowel sounds present. No organomegaly or mass.  EXTREMITIES: No pedal edema, cyanosis, or clubbing.  NEUROLOGIC: No focal weakness.  Gait not checked, as patient is too weak to ambulate.  PSYCHIATRIC: The patient is alert and oriented x 3, looks lethargic.  SKIN: No obvious rash, lesion, or ulcer.   LABORATORY PANEL:   CBC Recent Labs  Lab 03/18/17 1639  WBC 16.2*  HGB 12.3  HCT 35.8  PLT 256  MCV 89.7  MCH 30.7  MCHC 34.3  RDW 14.0   ------------------------------------------------------------------------------------------------------------------  Chemistries  Recent Labs  Lab 03/18/17 1639  NA 134*  K 3.0*  CL 101  CO2 22  GLUCOSE 111*  BUN 8  CREATININE 0.89  CALCIUM 8.4*   ------------------------------------------------------------------------------------------------------------------ estimated creatinine clearance is 73.4 mL/min (by C-G formula based on SCr of 0.89 mg/dL). ------------------------------------------------------------------------------------------------------------------ No results for input(s): TSH, T4TOTAL, T3FREE, THYROIDAB in the last 72 hours.  Invalid input(s): FREET3   Coagulation profile No results for input(s): INR, PROTIME in the last 168 hours. ------------------------------------------------------------------------------------------------------------------- No results for input(s): DDIMER in the last 72 hours. -------------------------------------------------------------------------------------------------------------------  Cardiac Enzymes No results for input(s): CKMB, TROPONINI, MYOGLOBIN in the last 168 hours.  Invalid input(s): CK ------------------------------------------------------------------------------------------------------------------ Invalid input(s): POCBNP  ---------------------------------------------------------------------------------------------------------------  Urinalysis     Component Value Date/Time   COLORURINE YELLOW (A) 02/07/2017 0454   APPEARANCEUR TURBID (A) 02/07/2017 0454   APPEARANCEUR Clear 05/15/2014 2120   LABSPEC 1.033 (H) 02/07/2017 0454   LABSPEC 1.006 05/15/2014 2120   PHURINE 5.0 02/07/2017 0454   GLUCOSEU NEGATIVE 02/07/2017 0454   GLUCOSEU Negative 05/15/2014 2120   HGBUR LARGE (A) 02/07/2017 0454   BILIRUBINUR SMALL (A) 02/07/2017 0454   BILIRUBINUR Negative 05/15/2014 2120   KETONESUR 5 (A) 02/07/2017 0454   PROTEINUR 100 (A) 02/07/2017 0454   NITRITE NEGATIVE 02/07/2017 0454   LEUKOCYTESUR MODERATE (A) 02/07/2017 0454   LEUKOCYTESUR Trace 05/15/2014 2120     RADIOLOGY: Ct Angio Chest Pe W And/or Wo Contrast  Result Date: 03/18/2017 CLINICAL DATA:  Dizziness last night and today. Onset of fever, cough, nausea and pain today. EXAM: CT ANGIOGRAPHY CHEST WITH CONTRAST TECHNIQUE: Multidetector CT imaging of the chest was performed using the standard protocol during bolus administration of intravenous contrast. Multiplanar CT image reconstructions and MIPs were obtained to evaluate the vascular anatomy. CONTRAST:  75 ml ISOVUE-370 IOPAMIDOL (ISOVUE-370) INJECTION 76% COMPARISON:  CT chest 10/17/2016.  PA and lateral chest 11/26/2016. FINDINGS: Cardiovascular: No pulmonary embolus is identified. Heart size is  normal. No pericardial effusion. Scattered calcific aortic atherosclerotic calcifications are identified. Mediastinum/Nodes: No pathologic lymphadenopathy by CT size criteria. Calcified mediastinal and hilar lymph nodes are compatible with old granulomatous disease. Lungs/Pleura: Dense airspace disease throughout almost the entire right middle lobe is consistent with pneumonia. There is some emphysematous change in the lungs. No nodule or mass is identified. Airways are unremarkable. Upper Abdomen: There is diffuse fatty infiltration of the liver. Musculoskeletal: Negative. Review of the MIP images confirms the above findings. IMPRESSION:  Dense right middle lobe airspace disease consistent with pneumonia. Recommend follow-up to clearing. Negative for pulmonary embolus. Diffuse fatty infiltration of the liver. Aortic Atherosclerosis (ICD10-I70.0) and Emphysema (ICD10-J43.9). Electronically Signed   By: Drusilla Kanner M.D.   On: 03/18/2017 21:27   US Abdomen Limited Ruq  Result Date: 03/18/2017 CLINICAL DATA:  Acute right upper quadrant abdominal pain. EXAM: ULTRASOUND ABDOMEN LIMITED RIGHT UPPER QUADRANT COMPARISON:  CT scan of February 07, 2017. FINDINGS: Gallbladder: No gallstones or wall thickening visualized. No sonographic Murphy sign noted by sonographer. Common bile duct: Diameter: 2.4 mm which is within normal limits. Liver: No focal lesion identified. Within normal limits in parenchymal echogenicity. Portal vein is patent on color Doppler imaging with normal direction of blood flow towards the liver. IMPRESSION: No abnormality seen in the right upper quadrant of the abdomen. Electronically Signed   By: Lupita Raider, M.D.   On: 03/18/2017 20:42    EKG: Orders placed or performed during the hospital encounter of 03/18/17  . ED EKG  . ED EKG    IMPRESSION AND PLAN:  1.  Acute hypoxemic respiratory failure, secondary to community-acquired pneumonia.  We will start antibiotics, duo nebs and oxygen therapy.   2.  Sepsis, likely secondary to pneumonia.  Will transfer patient to intensive care unit for close monitoring.  See management, as above under #1. 3.  Community-acquired pneumonia.  Right middle lobe infiltrate is confirmed per chest CT. will start  ceftriaxone and doxycycline IV.  Continue oxygen therapy and duo nebs. 4.  Hypokalemia and hypomagnesemia, will replace electrolytes per protocol. 5.  Bipolar disorder, will restart home medications with some adjustments, holding and lowering the doses and certain medications with sedative potential, due to patient being already lethargic.  Continue to monitor clinically  closely.  All the records are reviewed and case discussed with ED provider. Management plans discussed with the patient and she is in agreement.  CODE STATUS: Code Status History    Date Active Date Inactive Code Status Order ID Comments User Context   12/31/2014 02:26 01/12/2015 18:50 Full Code [id number]  Ricarda Frame, MD Inpatient       TOTAL TIME TAKING CARE OF THIS PATIENT: 45 minutes.    Cammy Copa M.D on 03/18/2017 at 11:43 PM  Between 7am to 6pm - Pager - [phone]  After 6pm go to [email or web address] - password [email or web address] [facility]  [facility]  Office  [phone]  CC: Primary care physician; [facility]

## 2017-03-18 NOTE — ED Triage Notes (Signed)
Pt states last night she got up and felt dizzy. Unsure if LOC. States today fever, side pain, dizzy, cough, nausea. States LOC today. Alert, oriented, in wheelchair. Generalized pain.

## 2017-03-18 NOTE — ED Notes (Signed)
In to give pt's medications as ordered, pt has gone to CT via stretcher

## 2017-03-18 NOTE — ED Notes (Addendum)
In to check on pt; says pain has eased but she says she can't have the US until her pain is all gone; explained to pt that we may not be able to ease her pain all the way; pt says she will try to have her US now; US tech informed of same

## 2017-03-18 NOTE — ED Notes (Signed)
Pt resting more comfortably now; c/o thirst but understands nothing to eat or drink until US results and MD gives OK:

## 2017-03-18 NOTE — ED Notes (Signed)
Pt up to toilet in room to void; back to bed; pt looking pale and tired; pain to right upper quadrant increased after coughing spell; able to convince pt to be admitted to hospital; resp up to 52; sats down to 88% on room air; pt placed back on 2L via [place]; sats up to 93%

## 2017-03-18 NOTE — Progress Notes (Signed)
CODE SEPSIS - PHARMACY COMMUNICATION  **Broad Spectrum Antibiotics should be administered within 1 hour of Sepsis diagnosis**  Time Code Sepsis Called/Page Received: 0000  Antibiotics Ordered: azithro/ceftriaxone  Time of 1st antibiotic administration: 2251  Additional action taken by pharmacy:   If necessary, Name of Provider/Nurse Contacted:    Thomasene Rippleavid  Arwin Bisceglia ,PharmD Clinical Pharmacist  03/18/2017  11:40 PM

## 2017-03-18 NOTE — ED Notes (Signed)
Pt using call bell; voiced need to void; moved stretcher by toilet, non skid socks placed on pt, and assisted x 1 to toilet in room; pt refused to provide urine specimen; back to bed; pt with groaning respirations; tachy at 144; rates pain 9/10

## 2017-03-18 NOTE — ED Notes (Signed)
Given mask. Informed to wear it until in treatment room.

## 2017-03-18 NOTE — ED Notes (Signed)
Answered call bell; pt requesting water and given same; MD has already been in to discuss admission with pt; she refuses to be admitted; unable to convince otherwise; understands MD wants her to get antibiotics before leaving and has agreed to sign AMA when she leaves; oxygen turned off to see how pt responds;

## 2017-03-19 ENCOUNTER — Other Ambulatory Visit: Payer: Self-pay

## 2017-03-19 DIAGNOSIS — J181 Lobar pneumonia, unspecified organism: Secondary | ICD-10-CM

## 2017-03-19 LAB — CBC
HCT: 33.2 % — ABNORMAL LOW (ref 35.0–47.0)
Hemoglobin: 11.1 g/dL — ABNORMAL LOW (ref 12.0–16.0)
MCH: 30.1 pg (ref 26.0–34.0)
MCHC: 33.3 g/dL (ref 32.0–36.0)
MCV: 90.5 fL (ref 80.0–100.0)
PLATELETS: 242 10*3/uL (ref 150–440)
RBC: 3.67 MIL/uL — ABNORMAL LOW (ref 3.80–5.20)
RDW: 14.1 % (ref 11.5–14.5)
WBC: 14.9 10*3/uL — ABNORMAL HIGH (ref 3.6–11.0)

## 2017-03-19 LAB — BASIC METABOLIC PANEL
ANION GAP: 9 (ref 5–15)
Anion gap: 12 (ref 5–15)
BUN: 7 mg/dL (ref 6–20)
BUN: 9 mg/dL (ref 6–20)
CALCIUM: 7.2 mg/dL — AB (ref 8.9–10.3)
CHLORIDE: 103 mmol/L (ref 101–111)
CO2: 20 mmol/L — AB (ref 22–32)
CO2: 23 mmol/L (ref 22–32)
CREATININE: 0.81 mg/dL (ref 0.44–1.00)
Calcium: 7.1 mg/dL — ABNORMAL LOW (ref 8.9–10.3)
Chloride: 104 mmol/L (ref 101–111)
Creatinine, Ser: 0.85 mg/dL (ref 0.44–1.00)
GFR calc Af Amer: >60 mL/min (ref >60)
GFR calc Af Amer: >60 mL/min (ref >60)
GFR calc non Af Amer: >60 mL/min (ref >60)
GLUCOSE: 97 mg/dL (ref 65–99)
GLUCOSE: 231 mg/dL — AB (ref 65–99)
POTASSIUM: 3.6 mmol/L (ref 3.5–5.1)
Potassium: 2.9 mmol/L — ABNORMAL LOW (ref 3.5–5.1)
Sodium: 135 mmol/L (ref 135–145)
Sodium: 136 mmol/L (ref 135–145)

## 2017-03-19 LAB — BLOOD GAS, ARTERIAL
Acid-base deficit: 4.6 mmol/L — ABNORMAL HIGH (ref 0.0–2.0)
Bicarbonate: 18.6 mmol/L — ABNORMAL LOW (ref 20.0–28.0)
FIO2: 0.6
O2 SAT: 96.3 %
PATIENT TEMPERATURE: 37
PCO2 ART: 28 mmHg — AB (ref 32.0–48.0)
PO2 ART: 82 mmHg — AB (ref 83.0–108.0)
pH, Arterial: 7.43 (ref 7.350–7.450)

## 2017-03-19 LAB — MRSA PCR SCREENING: MRSA by PCR: NEGATIVE

## 2017-03-19 LAB — LACTIC ACID, PLASMA
LACTIC ACID, VENOUS: 1.8 mmol/L (ref 0.5–1.9)
Lactic Acid, Venous: 1.6 mmol/L (ref 0.5–1.9)

## 2017-03-19 LAB — MAGNESIUM
Magnesium: 0.9 mg/dL — CL (ref 1.7–2.4)
Magnesium: 3.4 mg/dL — ABNORMAL HIGH (ref 1.7–2.4)

## 2017-03-19 LAB — GLUCOSE, CAPILLARY
GLUCOSE-CAPILLARY: 194 mg/dL — AB (ref 65–99)
Glucose-Capillary: 97 mg/dL (ref 65–99)

## 2017-03-19 MED ORDER — POTASSIUM CHLORIDE 10 MEQ/100ML IV SOLN
10.0000 meq | INTRAVENOUS | Status: AC
  Administered 2017-03-19 (×4): 10 meq via INTRAVENOUS
  Filled 2017-03-19 (×5): qty 100

## 2017-03-19 MED ORDER — POTASSIUM CHLORIDE CRYS ER 20 MEQ PO TBCR
20.0000 meq | EXTENDED_RELEASE_TABLET | Freq: Once | ORAL | Status: AC
  Administered 2017-03-19: 20 meq via ORAL
  Filled 2017-03-19: qty 1

## 2017-03-19 MED ORDER — MAGNESIUM SULFATE 2 GM/50ML IV SOLN
2.0000 g | Freq: Once | INTRAVENOUS | Status: AC
  Administered 2017-03-19: 2 g via INTRAVENOUS
  Filled 2017-03-19: qty 50

## 2017-03-19 MED ORDER — PANTOPRAZOLE SODIUM 40 MG PO TBEC
40.0000 mg | DELAYED_RELEASE_TABLET | Freq: Every day | ORAL | Status: DC
  Administered 2017-03-19 – 2017-03-25 (×7): 40 mg via ORAL
  Filled 2017-03-19 (×7): qty 1

## 2017-03-19 MED ORDER — BISACODYL 5 MG PO TBEC: 5.0000 mg | DELAYED_RELEASE_TABLET | Freq: Every day | ORAL | Status: DC | PRN

## 2017-03-19 MED ORDER — ONDANSETRON HCL 4 MG PO TABS
4.0000 mg | ORAL_TABLET | Freq: Four times a day (QID) | ORAL | Status: DC | PRN
  Administered 2017-03-23: 4 mg via ORAL
  Filled 2017-03-19: qty 1

## 2017-03-19 MED ORDER — MAGNESIUM SULFATE 50 % IJ SOLN: 2.0000 g | Freq: Once | INTRAMUSCULAR | Status: DC

## 2017-03-19 MED ORDER — IBUPROFEN 400 MG PO TABS
400.0000 mg | ORAL_TABLET | Freq: Four times a day (QID) | ORAL | Status: DC | PRN
  Administered 2017-03-19 – 2017-03-20 (×3): 400 mg via ORAL
  Filled 2017-03-19 (×3): qty 1

## 2017-03-19 MED ORDER — ONDANSETRON HCL 4 MG/2ML IJ SOLN
4.0000 mg | Freq: Four times a day (QID) | INTRAMUSCULAR | Status: DC | PRN
  Administered 2017-03-21 – 2017-03-24 (×5): 4 mg via INTRAVENOUS
  Filled 2017-03-19 (×8): qty 2

## 2017-03-19 MED ORDER — TRAZODONE HCL 50 MG PO TABS: 25.0000 mg | ORAL_TABLET | Freq: Every evening | ORAL | Status: DC | PRN

## 2017-03-19 MED ORDER — ACETAMINOPHEN 325 MG PO TABS
650.0000 mg | ORAL_TABLET | Freq: Four times a day (QID) | ORAL | Status: DC | PRN
  Administered 2017-03-19 – 2017-03-24 (×5): 650 mg via ORAL
  Filled 2017-03-19 (×5): qty 2

## 2017-03-19 MED ORDER — MAGNESIUM SULFATE 4 GM/100ML IV SOLN
4.0000 g | Freq: Once | INTRAVENOUS | Status: AC
  Administered 2017-03-19: 4 g via INTRAVENOUS
  Filled 2017-03-19: qty 100

## 2017-03-19 MED ORDER — BUDESONIDE 0.5 MG/2ML IN SUSP
0.5000 mg | Freq: Two times a day (BID) | RESPIRATORY_TRACT | Status: DC
  Administered 2017-03-19 – 2017-03-25 (×11): 0.5 mg via RESPIRATORY_TRACT
  Filled 2017-03-19 (×11): qty 2

## 2017-03-19 MED ORDER — METHYLPREDNISOLONE SODIUM SUCC 125 MG IJ SOLR
80.0000 mg | Freq: Two times a day (BID) | INTRAMUSCULAR | Status: DC
  Administered 2017-03-19: 80 mg via INTRAVENOUS
  Filled 2017-03-19: qty 2

## 2017-03-19 MED ORDER — SODIUM CHLORIDE 0.9 % IV SOLN
Freq: Once | INTRAVENOUS | Status: AC
  Administered 2017-03-19: 02:00:00 via INTRAVENOUS

## 2017-03-19 MED ORDER — DOCUSATE SODIUM 100 MG PO CAPS
100.0000 mg | ORAL_CAPSULE | Freq: Two times a day (BID) | ORAL | Status: DC
  Administered 2017-03-19 – 2017-03-20 (×2): 100 mg via ORAL
  Filled 2017-03-19 (×12): qty 1

## 2017-03-19 MED ORDER — ESTROGENS, CONJUGATED 0.625 MG/GM VA CREA
0.5000 | TOPICAL_CREAM | VAGINAL | Status: DC
  Administered 2017-03-22: 0.5 via VAGINAL
  Filled 2017-03-19 (×2): qty 30

## 2017-03-19 MED ORDER — TRAMADOL HCL 50 MG PO TABS: 50.0000 mg | ORAL_TABLET | Freq: Four times a day (QID) | ORAL | Status: DC | PRN

## 2017-03-19 MED ORDER — BUSPIRONE HCL 10 MG PO TABS
10.0000 mg | ORAL_TABLET | Freq: Two times a day (BID) | ORAL | Status: DC | PRN
  Administered 2017-03-19 – 2017-03-20 (×2): 10 mg via ORAL
  Filled 2017-03-19 (×3): qty 1

## 2017-03-19 MED ORDER — SODIUM CHLORIDE 0.9 % IV SOLN
100.0000 mg | Freq: Two times a day (BID) | INTRAVENOUS | Status: DC
  Administered 2017-03-19 – 2017-03-21 (×6): 100 mg via INTRAVENOUS
  Filled 2017-03-19 (×10): qty 100

## 2017-03-19 MED ORDER — BUPROPION HCL ER (SR) 150 MG PO TB12
150.0000 mg | ORAL_TABLET | Freq: Every day | ORAL | Status: DC
  Administered 2017-03-19 – 2017-03-25 (×7): 150 mg via ORAL
  Filled 2017-03-19 (×8): qty 1

## 2017-03-19 MED ORDER — IPRATROPIUM-ALBUTEROL 0.5-2.5 (3) MG/3ML IN SOLN
RESPIRATORY_TRACT | Status: AC
  Filled 2017-03-19: qty 3

## 2017-03-19 MED ORDER — ACETAMINOPHEN 650 MG RE SUPP: 650.0000 mg | Freq: Four times a day (QID) | RECTAL | Status: DC | PRN

## 2017-03-19 MED ORDER — CLONAZEPAM 0.5 MG PO TBDP
0.5000 mg | ORAL_TABLET | Freq: Every day | ORAL | Status: DC | PRN
  Administered 2017-03-19 – 2017-03-20 (×2): 0.5 mg via ORAL
  Filled 2017-03-19: qty 4
  Filled 2017-03-19: qty 1

## 2017-03-19 MED ORDER — METHYLPREDNISOLONE SODIUM SUCC 40 MG IJ SOLR
40.0000 mg | Freq: Two times a day (BID) | INTRAMUSCULAR | Status: DC
  Administered 2017-03-19 – 2017-03-20 (×2): 40 mg via INTRAVENOUS
  Filled 2017-03-19 (×2): qty 1

## 2017-03-19 MED ORDER — MORPHINE SULFATE (PF) 2 MG/ML IV SOLN
2.0000 mg | Freq: Once | INTRAVENOUS | Status: AC
  Administered 2017-03-19: 2 mg via INTRAVENOUS
  Filled 2017-03-19: qty 1

## 2017-03-19 MED ORDER — ESTRADIOL 1 MG PO TABS
1.5000 mg | ORAL_TABLET | Freq: Every day | ORAL | Status: DC
  Administered 2017-03-19 – 2017-03-25 (×7): 1.5 mg via ORAL
  Filled 2017-03-19 (×8): qty 1.5

## 2017-03-19 MED ORDER — HEPARIN SODIUM (PORCINE) 5000 UNIT/ML IJ SOLN
5000.0000 [IU] | Freq: Three times a day (TID) | INTRAMUSCULAR | Status: DC
  Administered 2017-03-19 – 2017-03-25 (×18): 5000 [IU] via SUBCUTANEOUS
  Filled 2017-03-19 (×19): qty 1

## 2017-03-19 MED ORDER — IPRATROPIUM-ALBUTEROL 0.5-2.5 (3) MG/3ML IN SOLN
3.0000 mL | RESPIRATORY_TRACT | Status: DC
  Administered 2017-03-19 – 2017-03-21 (×12): 3 mL via RESPIRATORY_TRACT
  Filled 2017-03-19 (×14): qty 3

## 2017-03-19 MED ORDER — IPRATROPIUM-ALBUTEROL 0.5-2.5 (3) MG/3ML IN SOLN
3.0000 mL | RESPIRATORY_TRACT | Status: DC | PRN
  Administered 2017-03-19: 3 mL via RESPIRATORY_TRACT

## 2017-03-19 MED ORDER — QUETIAPINE FUMARATE ER 200 MG PO TB24
200.0000 mg | ORAL_TABLET | Freq: Every day | ORAL | Status: DC
  Administered 2017-03-19 – 2017-03-20 (×2): 200 mg via ORAL
  Filled 2017-03-19 (×5): qty 1

## 2017-03-19 MED ORDER — DULOXETINE HCL 30 MG PO CPEP
30.0000 mg | ORAL_CAPSULE | Freq: Three times a day (TID) | ORAL | Status: DC
  Administered 2017-03-19 – 2017-03-25 (×19): 30 mg via ORAL
  Filled 2017-03-19 (×20): qty 1

## 2017-03-19 MED ORDER — SODIUM CHLORIDE 0.9 % IV SOLN
1.0000 g | INTRAVENOUS | Status: DC
  Administered 2017-03-19 – 2017-03-24 (×6): 1 g via INTRAVENOUS
  Filled 2017-03-19 (×8): qty 10

## 2017-03-19 NOTE — ED Notes (Signed)
Report called to receiving RN, Annice Pihjackie

## 2017-03-19 NOTE — Progress Notes (Signed)
Md paged to notify of patient placed on venti mask at 55% and sats only 90%. RT recommending bipap d/t pt resp effort at current time. Order to transfer to icu

## 2017-03-19 NOTE — Progress Notes (Signed)
eLink Physician-Brief Progress Note Patient Name: Brandy MackintoshKimberly C Robinson DOB: 1965/03/17 MRN: [id number]   Date of Service  03/19/2017  HPI/Events of Note  Called by RN's regarding increased dyspnea. Agree with HFNC. Patient has RML pneumonia with pleurisy and resultant chest pain.  eICU Interventions  ABG after 20min on HFNC, morphine for chest pain.         Lennis Rader 03/19/2017, 3:48 AM

## 2017-03-19 NOTE — Progress Notes (Signed)
Pharmacy Antibiotic Note  Brandy Robinson is a 52 y.o. female admitted on 03/18/2017 with pneumonia.  Pharmacy has been consulted for azithromycin/ceftriaxone; however, patient has an EKG that shows a QTc of 534 and has already received one dose of azithromycin in the ED. Spoke to MD to switch abx to doxycycline considering patient already has a prolonged QTc and is on other QTc prolonging medications. MD notified and agrees with plan.  Plan: Patient has already received azithromycin 500 mg and ceftriaxone 1g IV x 1 in ED  Will transition to doxycycline 100 mg IV bid to avoid QTc prolongation. Will continue ceftriaxone 1g IV daily  Height: 5\' 3"  (160 cm) Weight: 162 lb (73.5 kg) IBW/kg (Calculated) : 52.4  Temp (24hrs), Avg:99.6 F (37.6 C), Min:98.9 F (37.2 C), Max:100 F (37.8 C)  Recent Labs  Lab 03/18/17 1639 03/18/17 2345  WBC 16.2*  --   CREATININE 0.89  --   LATICACIDVEN  --  1.6    Estimated Creatinine Clearance: 73.4 mL/min (by C-G formula based on SCr of 0.89 mg/dL).    Allergies  Allergen Reactions  . Hydrocodone Itching  . Aspirin Other (See Comments)    Reaction:  GI upset   . Morphine And Related Itching, Nausea And Vomiting and Other (See Comments)    Reaction:  Chest pain    Thank you for allowing pharmacy to be a part of this patient's care.  Thomasene Rippleavid Abbigael Detlefsen, PharmD, BCPS Clinical Pharmacist 03/19/2017

## 2017-03-19 NOTE — Progress Notes (Signed)
Pt independently up to the bedside commode.  No change of oxygen saturation of 96% on 5L [place].  No dyspnea noted.  Pt denies SOB.

## 2017-03-19 NOTE — Consult Note (Signed)
Name: Brandy Robinson MRN: [id number] DOB: 1965-05-21     CONSULTATION DATE: 03/18/2017  REFERRING MD : stafford  CHIEF COMPLAINT:SOB    HISTORY OF PRESENT ILLNESS: Brandy Robinson  is a 52 y.o. female with a known history of bipolar disorder, anxiety and depression disorder, insomnia. Patient presented to emergency room for acute onset of nausea, vomiting and diffuse abdominal pain that started in the past 2 days, gradually getting worse.  She has been so weak that she even had a fall at home, yesterday.  Patient denies any chest pain or shortness of breath, no fever or chills no bleeding. Upon evaluation in the emergency room she was found hypoxic tachypneic and tachycardic.  Oxygen saturation was 83% at the arrival to emergency room.  Blood tests are notable for elevated WBC at 16.2 and low magnesium and potassium levels.  Lactic acid, within normal limits. CAT scan of the chest and the abdomen, reviewed by myself, are remarkable for right middle lobe infiltrate and fatty liver.  Patient placed on high flow [place]  Anti-infectives (From admission, onward)   Start     Dose/Rate Route Frequency Ordered Stop   03/19/17 1000  cefTRIAXone (ROCEPHIN) 1 g in sodium chloride 0.9 % 100 mL IVPB     1 g 200 mL/hr over 30 Minutes Intravenous Every 24 hours 03/19/17 0320     03/19/17 0115  doxycycline (VIBRAMYCIN) 100 mg in sodium chloride 0.9 % 250 mL IVPB     100 mg 125 mL/hr over 120 Minutes Intravenous Every 12 hours 03/19/17 0102     03/18/17 2245  azithromycin (ZITHROMAX) 500 mg in sodium chloride 0.9 % 250 mL IVPB  Status:  Discontinued     500 mg 250 mL/hr over 60 Minutes Intravenous  Once 03/18/17 2241 03/19/17 0210   03/18/17 2215  levofloxacin (LEVAQUIN) tablet 750 mg  Status:  Discontinued     750 mg Oral  Once 03/18/17 2211 03/18/17 2241   03/18/17 2215  cefTRIAXone (ROCEPHIN) 1 g in sodium chloride 0.9 % 100 mL IVPB     1 g 200 mL/hr over 30 Minutes Intravenous  Once 03/18/17 2211  03/18/17 2335       PAST MEDICAL HISTORY :   has a past medical history of Anxiety, Bipolar disorder (HCC), Depression, Disc disorder, Dyspareunia, Endometriosis, GERD (gastroesophageal reflux disease), Insomnia, Lichen, Surgical menopause, Vaginal atrophy, and Wears glasses.  has a past surgical history that includes Abdominal hysterectomy (1995); Arm wound repair / closure (2013); Knee bursectomy (Left, 09/24/2013); and laparotomy (N/A, 12/30/2014). Prior to Admission medications   Medication Sig Start Date End Date Taking? Authorizing Provider  buPROPion (WELLBUTRIN SR) 150 MG 12 hr tablet Take 150 mg by mouth daily.   Yes [provider]  busPIRone (BUSPAR) 10 MG tablet Take 10 mg by mouth 4 (four) times daily.    Yes [provider]  clonazePAM (KLONOPIN) 0.5 MG tablet Take 0.5 mg by mouth daily as needed.    Yes [provider]  cyclobenzaprine (FLEXERIL) 10 MG tablet Take 10 mg by mouth as needed for muscle spasms.    Yes [provider]  DULoxetine (CYMBALTA) 30 MG capsule Take 30 mg by mouth at bedtime.    Yes [provider]  estradiol (ESTRACE) 1 MG tablet TAKE 1 1/2 (1.5MG ) BY MOUTH EVERY DAY 02/27/17  Yes Defrancesco, Prentice Docker, MD  pantoprazole (PROTONIX) 40 MG tablet Take 1 tablet (40 mg total) by mouth daily. 05/13/15  Yes Marguerita Merles  L, MD  QUEtiapine (SEROQUEL) 300 MG tablet Take 300 mg by mouth at bedtime.   Yes [provider]  celecoxib (CELEBREX) 100 MG capsule Take 1 capsule (100 mg total) by mouth 2 (two) times daily. Patient not taking: Reported on 03/18/2017 01/28/15   Ricarda Frame, MD  conjugated estrogens (PREMARIN) vaginal cream Place 0.5 Applicatorfuls vaginally every Monday, Wednesday, and Friday. Patient not taking: Reported on 03/18/2017 03/04/16   Defrancesco, Prentice Docker, MD  famotidine (PEPCID) 40 MG tablet Take 1 tablet (40 mg total) every evening by mouth. Patient not taking: Reported on 02/07/2017 11/27/16  11/27/17  Rebecka Apley, MD  fluconazole (DIFLUCAN) 150 MG tablet Take 1 tablet (150 mg total) by mouth daily. Patient not taking: Reported on 02/07/2017 01/31/17   Defrancesco, Prentice Docker, MD  metoCLOPramide (REGLAN) 10 MG tablet Take 1 tablet (10 mg total) by mouth every 8 (eight) hours as needed. Patient not taking: Reported on 02/07/2017 10/17/16   Rebecka Apley, MD  mometasone (ELOCON) 0.1 % ointment Apply 1 application topically 2 (two) times a week. Patient not taking: Reported on 10/17/2016 03/03/16   Defrancesco, Prentice Docker, MD  omeprazole (PRILOSEC) 40 MG capsule Take 1 capsule (40 mg total) daily by mouth. Patient not taking: Reported on 02/07/2017 11/27/16 11/27/17  Rebecka Apley, MD  ondansetron (ZOFRAN ODT) 4 MG disintegrating tablet Take 1 tablet (4 mg total) by mouth every 8 (eight) hours as needed for nausea or vomiting. Patient not taking: Reported on 03/18/2017 10/20/16   Nita Sickle, MD  ondansetron (ZOFRAN ODT) 4 MG disintegrating tablet Take 1 tablet (4 mg total) every 8 (eight) hours as needed by mouth for nausea or vomiting. Patient not taking: Reported on 02/07/2017 11/27/16   Rebecka Apley, MD  oxyCODONE-acetaminophen (ROXICET) 5-325 MG tablet Take 1 tablet by mouth every 6 (six) hours as needed. Patient not taking: Reported on 02/07/2017 10/20/16 10/20/17  Nita Sickle, MD  QUEtiapine (SEROQUEL XR) 400 MG 24 hr tablet Take by mouth at bedtime.     [provider]  sucralfate (CARAFATE) 1 g tablet Take 1 tablet (1 g total) by mouth 2 (two) times daily. Patient not taking: Reported on 02/07/2017 10/17/16   Rebecka Apley, MD  sulfamethoxazole-trimethoprim (BACTRIM DS,SEPTRA DS) 800-160 MG tablet Take 1 tablet by mouth 2 (two) times daily. Patient not taking: Reported on 03/18/2017 02/07/17   Jene Every, MD  traMADol (ULTRAM) 50 MG tablet Take 1 tablet (50 mg total) by mouth every 6 (six) hours as needed. Patient not taking: Reported on 03/18/2017  02/07/17 02/07/18  Jene Every, MD   Allergies  Allergen Reactions  . Hydrocodone Itching  . Aspirin Other (See Comments)    Reaction:  GI upset   . Morphine And Related Itching, Nausea And Vomiting and Other (See Comments)    Reaction:  Chest pain    FAMILY HISTORY:  family history includes Cancer in her father; Diabetes in her mother and sister. SOCIAL HISTORY:  reports that she has been smoking cigarettes.  She has been smoking about 1.00 pack per day. she has never used smokeless tobacco. She reports that she does not drink alcohol or use drugs.  REVIEW OF SYSTEMS:   SOB, no fevers,   ALL OTHER ROS ARE NEGATIVE    VITAL SIGNS: Temp:  [97.8 F (36.6 C)-100 F (37.8 C)] 97.8 F (36.6 C) (03/03 0800) Pulse Rate:  [98-149] 98 (03/03 1000) Resp:  [18-48] 28 (03/03 1000) BP: (95-148)/(43-102) 101/61 (03/03 1000)  SpO2:  [85 %-97 %] 93 % (03/03 1000) FiO2 (%):  [55 %-60 %] 60 % (03/03 0800) Weight:  [162 lb (73.5 kg)-173 lb 8 oz (78.7 kg)] 173 lb 8 oz (78.7 kg) (03/03 0300)  Physical Examination:   GENERAL:NAD, no fevers, chills, + weakness + fatigue HEAD: Normocephalic, atraumatic.  EYES: Pupils equal, round, reactive to light. Extraocular muscles intact. No scleral icterus.  MOUTH: Moist mucosal membrane.   EAR, NOSE, THROAT: Clear without exudates. No external lesions.  NECK: Supple. No thyromegaly. No nodules. No JVD.  PULMONARY:CTA B/L + wheezes, no crackles, no rhonchi CARDIOVASCULAR: S1 and S2. Regular rate and rhythm. No murmurs, rubs, or gallops. No edema.  GASTROINTESTINAL: Soft, nontender, nondistended. No masses. Positive bowel sounds.  MUSCULOSKELETAL: No swelling, clubbing, or edema. Range of motion full in all extremities.  NEUROLOGIC: Cranial nerves II through XII are intact. No gross focal neurological deficits.  SKIN: No ulceration, lesions, rashes, or cyanosis. Skin warm and dry. Turgor intact.  PSYCHIATRIC: Mood, affect within normal limits. The  patient is awake, alert and oriented x 3. Insight, judgment intact.      Recent Labs  Lab 03/18/17 1639 03/19/17 0119  NA 134* 135  K 3.0* 2.9*  CL 101 103  CO2 22 20*  BUN 8 7  CREATININE 0.89 0.81  GLUCOSE 111* 97   Recent Labs  Lab 03/18/17 1639 03/19/17 0119  HGB 12.3 11.1*  HCT 35.8 33.2*  WBC 16.2* 14.9*  PLT 256 242   Ct Angio Chest Pe W And/or Wo Contrast  Result Date: 03/18/2017 CLINICAL DATA:  Dizziness last night and today. Onset of fever, cough, nausea and pain today. EXAM: CT ANGIOGRAPHY CHEST WITH CONTRAST TECHNIQUE: Multidetector CT imaging of the chest was performed using the standard protocol during bolus administration of intravenous contrast. Multiplanar CT image reconstructions and MIPs were obtained to evaluate the vascular anatomy. CONTRAST:  75 ml ISOVUE-370 IOPAMIDOL (ISOVUE-370) INJECTION 76% COMPARISON:  CT chest 10/17/2016.  PA and lateral chest 11/26/2016. FINDINGS: Cardiovascular: No pulmonary embolus is identified. Heart size is normal. No pericardial effusion. Scattered calcific aortic atherosclerotic calcifications are identified. Mediastinum/Nodes: No pathologic lymphadenopathy by CT size criteria. Calcified mediastinal and hilar lymph nodes are compatible with old granulomatous disease. Lungs/Pleura: Dense airspace disease throughout almost the entire right middle lobe is consistent with pneumonia. There is some emphysematous change in the lungs. No nodule or mass is identified. Airways are unremarkable. Upper Abdomen: There is diffuse fatty infiltration of the liver. Musculoskeletal: Negative. Review of the MIP images confirms the above findings. IMPRESSION: Dense right middle lobe airspace disease consistent with pneumonia. Recommend follow-up to clearing. Negative for pulmonary embolus. Diffuse fatty infiltration of the liver. Aortic Atherosclerosis (ICD10-I70.0) and Emphysema (ICD10-J43.9). Electronically Signed   By: Drusilla Kannerhomas  Dalessio M.D.   On:  03/18/2017 21:27   [place] Abdomen Limited Ruq  Result Date: 03/18/2017 CLINICAL DATA:  Acute right upper quadrant abdominal pain. EXAM: ULTRASOUND ABDOMEN LIMITED RIGHT UPPER QUADRANT COMPARISON:  CT scan of February 07, 2017. FINDINGS: Gallbladder: No gallstones or wall thickening visualized. No sonographic Murphy sign noted by sonographer. Common bile duct: Diameter: 2.4 mm which is within normal limits. Liver: No focal lesion identified. Within normal limits in parenchymal echogenicity. Portal vein is patent on color Doppler imaging with normal direction of blood flow towards the liver. IMPRESSION: No abnormality seen in the right upper quadrant of the abdomen. Electronically Signed   By: Lupita RaiderJames  Green Jr, M.D.   On: 03/18/2017 20:42  I have Independently reviewed images of  CT chest   on 03/19/2017 Interpretation:RML opacity  ASSESSMENT / PLAN: 52 yo obese white female with hypoxic resp failure with RML pneumonia with sepsis With h/o smoking-will treat as presumed  COPD exacerbation  1.oxygen as needed 2.continue ABX 3.start IV steroids 4.NEB therapy 5.DVT prx   Lucie Leather, M.D.  [facility]  Medical Director [facility] Medical Director [facility] Cardio-Pulmonary Department

## 2017-03-19 NOTE — Progress Notes (Signed)
MD paged to notify of critical mag of 0.9 awaiting return call

## 2017-03-19 NOTE — Plan of Care (Signed)
[facility] from [facility] with respiratory distress. Patient has pneumonia.  Currently on HFNC at 60%.  Tolerated well.  O2 sats improved to >94.  Patient resting comfortably in bed.  Potassium and Magnesium replaced.  No distress noted.  Will monitor.

## 2017-03-19 NOTE — Progress Notes (Signed)
MD on call paged and returned page, notified of pt o2 sats of 85 on now 4L of o2 and HR sustaining in the 130's. MD ordered to start iv solumedrol and duo nebs. Orders placed. Will cont to monitor

## 2017-03-19 NOTE — Progress Notes (Signed)
resp in to adm breathing tx

## 2017-03-19 NOTE — Progress Notes (Addendum)
MEDICATION RELATED CONSULT NOTE - INITIAL   Pharmacy Consult for Electrolyte replacement Indication: hypokalemia  Allergies  Allergen Reactions  . Hydrocodone Itching  . Aspirin Other (See Comments)    Reaction:  GI upset   . Morphine And Related Itching, Nausea And Vomiting and Other (See Comments)    Reaction:  Chest pain    Patient Measurements: Height: 5\' 3"  (160 cm) Weight: 162 lb (73.5 kg) IBW/kg (Calculated) : 52.4 Adjusted Body Weight: 61 kg  Vital Signs: Temp: 99.8 F (37.7 C) (03/03 0058) Temp Source: Oral (03/03 0058) BP: 120/54 (03/03 0058) Pulse Rate: 130 (03/03 0058) Intake/Output from previous day: 03/02 0701 - 03/03 0700 In: 3100 [IV Piggyback:3100] Out: -  Intake/Output from this shift: Total I/O In: 2100 [IV Piggyback:2100] Out: -   Labs: Recent Labs    03/18/17 1639  WBC 16.2*  HGB 12.3  HCT 35.8  PLT 256  CREATININE 0.89   Estimated Creatinine Clearance: 73.4 mL/min (by C-G formula based on SCr of 0.89 mg/dL).   Microbiology: No results found for this or any previous visit (from the past 720 hour(s)).  Medical History: Past Medical History:  Diagnosis Date  . Anxiety   . Bipolar disorder (HCC)   . Depression   . Disc disorder   . Dyspareunia   . Endometriosis   . GERD (gastroesophageal reflux disease)   . Insomnia   . Lichen   . Surgical menopause   . Vaginal atrophy   . Wears glasses     Medications:  Scheduled:  . buPROPion  150 mg Oral Daily  . [START ON 03/20/2017] conjugated estrogens  0.5 Applicatorful Vaginal Q M,W,F  . docusate sodium  100 mg Oral BID  . DULoxetine  30 mg Oral TID  . estradiol  1.5 mg Oral Daily  . heparin  5,000 Units Subcutaneous Q8H  . pantoprazole  40 mg Oral Daily  . QUEtiapine  200 mg Oral QHS    Assessment: Patient admitted for SOB found to meet SIRS criteria and was called a code sepsis. Patient initially wanted to leave AMA but was convinced to stay.  CT shows R middle lobe  PNA Patient has a K+ of 3.0 on initial BMP  Goal of Therapy:  K+ 3.5 - 4.5  Plan:  Will replace K+ w/ 10 mEq IV x 4 for now since patient has a diet. Will also check mag to ensure that is WNL. Will recheck BMP and Mag w/ am labs.  Thomasene Rippleavid Dorissa Stinnette, PharmD, BCPS Clinical Pharmacist 03/19/2017

## 2017-03-19 NOTE — ED Notes (Signed)
Second set of blood cultures drawn; admitting MD at bedside;

## 2017-03-19 NOTE — Progress Notes (Signed)
[employer] - [place] at [facility]   PATIENT NAME: Brandy Robinson    MR#:  [id number]  DATE OF BIRTH:  Dec 16, 1965  SUBJECTIVE:  CHIEF COMPLAINT:   Chief Complaint  Patient presents with  . Dizziness  Case discussed with intensivist, continue ICU  REVIEW OF SYSTEMS:  CONSTITUTIONAL: No fever, fatigue or weakness.  EYES: No blurred or double vision.  EARS, NOSE, AND THROAT: No tinnitus or ear pain.  RESPIRATORY: No cough, shortness of breath, wheezing or hemoptysis.  CARDIOVASCULAR: No chest pain, orthopnea, edema.  GASTROINTESTINAL: No nausea, vomiting, diarrhea or abdominal pain.  GENITOURINARY: No dysuria, hematuria.  ENDOCRINE: No polyuria, nocturia,  HEMATOLOGY: No anemia, easy bruising or bleeding SKIN: No rash or lesion. MUSCULOSKELETAL: No joint pain or arthritis.   NEUROLOGIC: No tingling, numbness, weakness.  PSYCHIATRY: No anxiety or depression.   ROS  DRUG ALLERGIES:   Allergies  Allergen Reactions  . Hydrocodone Itching  . Aspirin Other (See Comments)    Reaction:  GI upset   . Morphine And Related Itching, Nausea And Vomiting and Other (See Comments)    Reaction:  Chest pain    VITALS:  Blood pressure 101/61, pulse 98, temperature 97.8 F (36.6 C), temperature source Oral, resp. rate (!) 28, height 5\' 3"  (1.6 m), weight 78.7 kg (173 lb 8 oz), SpO2 92 %.  PHYSICAL EXAMINATION:  GENERAL:  52 y.o.-year-old patient lying in the bed with no acute distress.  EYES: Pupils equal, round, reactive to light and accommodation. No scleral icterus. Extraocular muscles intact.  HEENT: Head atraumatic, normocephalic. Oropharynx and nasopharynx clear.  NECK:  Supple, no jugular venous distention. No thyroid enlargement, no tenderness.  LUNGS: Normal breath sounds bilaterally, no wheezing, rales,rhonchi or crepitation. No use of accessory muscles of respiration.  CARDIOVASCULAR: S1, S2 normal. No murmurs, rubs, or gallops.  ABDOMEN: Soft, nontender,  nondistended. Bowel sounds present. No organomegaly or mass.  EXTREMITIES: No pedal edema, cyanosis, or clubbing.  NEUROLOGIC: Cranial nerves II through XII are intact. Muscle strength 5/5 in all extremities. Sensation intact. Gait not checked.  PSYCHIATRIC: The patient is alert and oriented x 3.  SKIN: No obvious rash, lesion, or ulcer.   Physical Exam LABORATORY PANEL:   CBC Recent Labs  Lab 03/19/17 0119  WBC 14.9*  HGB 11.1*  HCT 33.2*  PLT 242   ------------------------------------------------------------------------------------------------------------------  Chemistries  Recent Labs  Lab 03/19/17 0119  NA 135  K 2.9*  CL 103  CO2 20*  GLUCOSE 97  BUN 7  CREATININE 0.81  CALCIUM 7.1*  MG 0.9*   ------------------------------------------------------------------------------------------------------------------  Cardiac Enzymes No results for input(s): TROPONINI in the last 168 hours. ------------------------------------------------------------------------------------------------------------------  RADIOLOGY:  Ct Angio Chest Pe W And/or Wo Contrast  Result Date: 03/18/2017 CLINICAL DATA:  Dizziness last night and today. Onset of fever, cough, nausea and pain today. EXAM: CT ANGIOGRAPHY CHEST WITH CONTRAST TECHNIQUE: Multidetector CT imaging of the chest was performed using the standard protocol during bolus administration of intravenous contrast. Multiplanar CT image reconstructions and MIPs were obtained to evaluate the vascular anatomy. CONTRAST:  75 ml ISOVUE-370 IOPAMIDOL (ISOVUE-370) INJECTION 76% COMPARISON:  CT chest 10/17/2016.  PA and lateral chest 11/26/2016. FINDINGS: Cardiovascular: No pulmonary embolus is identified. Heart size is normal. No pericardial effusion. Scattered calcific aortic atherosclerotic calcifications are identified. Mediastinum/Nodes: No pathologic lymphadenopathy by CT size criteria. Calcified mediastinal and hilar lymph nodes are compatible  with old granulomatous disease. Lungs/Pleura: Dense airspace disease throughout almost the entire right middle lobe is  consistent with pneumonia. There is some emphysematous change in the lungs. No nodule or mass is identified. Airways are unremarkable. Upper Abdomen: There is diffuse fatty infiltration of the liver. Musculoskeletal: Negative. Review of the MIP images confirms the above findings. IMPRESSION: Dense right middle lobe airspace disease consistent with pneumonia. Recommend follow-up to clearing. Negative for pulmonary embolus. Diffuse fatty infiltration of the liver. Aortic Atherosclerosis (ICD10-I70.0) and Emphysema (ICD10-J43.9). Electronically Signed   By: Drusilla Kannerhomas  Dalessio M.D.   On: 03/18/2017 21:27   [place] Abdomen Limited Ruq  Result Date: 03/18/2017 CLINICAL DATA:  Acute right upper quadrant abdominal pain. EXAM: ULTRASOUND ABDOMEN LIMITED RIGHT UPPER QUADRANT COMPARISON:  CT scan of February 07, 2017. FINDINGS: Gallbladder: No gallstones or wall thickening visualized. No sonographic Murphy sign noted by sonographer. Common bile duct: Diameter: 2.4 mm which is within normal limits. Liver: No focal lesion identified. Within normal limits in parenchymal echogenicity. Portal vein is patent on color Doppler imaging with normal direction of blood flow towards the liver. IMPRESSION: No abnormality seen in the right upper quadrant of the abdomen. Electronically Signed   By: Lupita RaiderJames  Green Jr, M.D.   On: 03/18/2017 20:42    ASSESSMENT AND PLAN:  1.  Acute hypoxemic respiratory failure Stable Secondary to acute pneumonia and COPD exacerbation Wean O2 as tolerated, aggressive pulmonary toilet with bronchodilator therapy  2.   Acute sepsis Secondary to pneumonia-community-acquired Continue pneumonia protocol, empiric antibiotics, follow-up on cultures  3.    Acute CAP Stable  Continue pneumonia protocol, empiric antibiotics, and IV fluids for rehydration   4.    Acute on COPD exacerbation   Continue IV Solu-Medrol with tapering as tolerated, empiric doxycycline/Rocephin, aggressive pulmonary toilet with bronchodilator therapy, supplemental oxygen with weaning as tolerated  5.  Acute hypokalemia/hypomagnesemia Repleted  6.    Chronic bipolar illness  Stable  Continue current regiment    All the records are reviewed and case discussed with Care Management/Social Workerr. Management plans discussed with the patient, family and they are in agreement.  CODE STATUS: full  TOTAL TIME TAKING CARE OF THIS PATIENT: 35 minutes.     POSSIBLE D/C IN 2-4 DAYS, DEPENDING ON CLINICAL CONDITION.   Evelena AsaMontell D Salary M.D on 03/19/2017   Between 7am to 6pm - Pager - [phone]  After 6pm go to [email or web address] - Social research officer, governmentpassword EPAS [email or web address]  [facility] [place] [facility]  Office  [phone]  CC: Primary care physician; Center, [facility]  Note: This dictation was prepared with Dragon dictation along with smaller phrase technology. Any transcriptional errors that result from this process are unintentional.

## 2017-03-20 LAB — GLUCOSE, CAPILLARY
GLUCOSE-CAPILLARY: 193 mg/dL — AB (ref 65–99)
GLUCOSE-CAPILLARY: 221 mg/dL — AB (ref 65–99)
Glucose-Capillary: 104 mg/dL — ABNORMAL HIGH (ref 65–99)
Glucose-Capillary: 168 mg/dL — ABNORMAL HIGH (ref 65–99)

## 2017-03-20 LAB — BASIC METABOLIC PANEL
ANION GAP: 11 (ref 5–15)
BUN: 12 mg/dL (ref 6–20)
CHLORIDE: 107 mmol/L (ref 101–111)
CO2: 20 mmol/L — ABNORMAL LOW (ref 22–32)
Calcium: 7.3 mg/dL — ABNORMAL LOW (ref 8.9–10.3)
Creatinine, Ser: 0.72 mg/dL (ref 0.44–1.00)
Glucose, Bld: 217 mg/dL — ABNORMAL HIGH (ref 65–99)
POTASSIUM: 3.3 mmol/L — AB (ref 3.5–5.1)
SODIUM: 138 mmol/L (ref 135–145)

## 2017-03-20 LAB — PHOSPHORUS: PHOSPHORUS: 1.7 mg/dL — AB (ref 2.5–4.6)

## 2017-03-20 LAB — HIV ANTIBODY (ROUTINE TESTING W REFLEX): HIV SCREEN 4TH GENERATION: NONREACTIVE

## 2017-03-20 LAB — MAGNESIUM: MAGNESIUM: 2.9 mg/dL — AB (ref 1.7–2.4)

## 2017-03-20 MED ORDER — SODIUM CHLORIDE 0.9 % IV BOLUS (SEPSIS)
500.0000 mL | Freq: Once | INTRAVENOUS | Status: AC
  Administered 2017-03-20: 500 mL via INTRAVENOUS

## 2017-03-20 MED ORDER — INSULIN ASPART 100 UNIT/ML ~~LOC~~ SOLN: 0.0000 [IU] | Freq: Every day | SUBCUTANEOUS | Status: DC

## 2017-03-20 MED ORDER — BUSPIRONE HCL 10 MG PO TABS
10.0000 mg | ORAL_TABLET | Freq: Four times a day (QID) | ORAL | Status: DC | PRN
  Administered 2017-03-21 (×2): 10 mg via ORAL
  Filled 2017-03-20 (×4): qty 1

## 2017-03-20 MED ORDER — POTASSIUM CHLORIDE CRYS ER 20 MEQ PO TBCR
40.0000 meq | EXTENDED_RELEASE_TABLET | Freq: Once | ORAL | Status: AC
  Administered 2017-03-20: 40 meq via ORAL
  Filled 2017-03-20: qty 2

## 2017-03-20 MED ORDER — HYDROCORTISONE NA SUCCINATE PF 100 MG IJ SOLR
50.0000 mg | Freq: Four times a day (QID) | INTRAMUSCULAR | Status: DC
  Administered 2017-03-20 – 2017-03-22 (×9): 50 mg via INTRAVENOUS
  Filled 2017-03-20 (×9): qty 2

## 2017-03-20 MED ORDER — SODIUM CHLORIDE 0.9 % IV BOLUS (SEPSIS)
1000.0000 mL | Freq: Once | INTRAVENOUS | Status: AC
  Administered 2017-03-20: 1000 mL via INTRAVENOUS

## 2017-03-20 MED ORDER — INSULIN ASPART 100 UNIT/ML ~~LOC~~ SOLN
0.0000 [IU] | Freq: Three times a day (TID) | SUBCUTANEOUS | Status: DC
  Administered 2017-03-20: 3 [IU] via SUBCUTANEOUS
  Administered 2017-03-21: 1 [IU] via SUBCUTANEOUS
  Administered 2017-03-21: 2 [IU] via SUBCUTANEOUS
  Administered 2017-03-21 – 2017-03-22 (×3): 1 [IU] via SUBCUTANEOUS
  Administered 2017-03-23: 2 [IU] via SUBCUTANEOUS
  Administered 2017-03-23 – 2017-03-24 (×3): 1 [IU] via SUBCUTANEOUS
  Administered 2017-03-24: 2 [IU] via SUBCUTANEOUS
  Administered 2017-03-25: 3 [IU] via SUBCUTANEOUS
  Administered 2017-03-25: 2 [IU] via SUBCUTANEOUS
  Filled 2017-03-20 (×13): qty 1

## 2017-03-20 MED ORDER — K PHOS MONO-SOD PHOS DI & MONO 155-852-130 MG PO TABS
500.0000 mg | ORAL_TABLET | Freq: Two times a day (BID) | ORAL | Status: AC
  Administered 2017-03-20 (×2): 500 mg via ORAL
  Filled 2017-03-20 (×3): qty 2

## 2017-03-20 NOTE — Progress Notes (Signed)
Inpatient Diabetes Program Recommendations  AACE/ADA: New Consensus Statement on Inpatient Glycemic Control (2015)  Target Ranges:  Prepandial:   less than 140 mg/dL      Peak postprandial:   less than 180 mg/dL (1-2 hours)      Critically ill patients:  140 - 180 mg/dL   Results for Brandy Robinson, Brandy Robinson (MRN [id number]) as of 03/20/2017 09:02  Ref. Range 03/18/2017 16:39 03/19/2017 01:19 03/19/2017 12:34 03/20/2017 05:46  Glucose Latest Ref Range: 65 - 99 mg/dL 409111 (H) 97 811231 (H) 914217 (H)   Review of Glycemic Control  Diabetes history: No Outpatient Diabetes medications: NA Current orders for Inpatient glycemic control: None  Inpatient Diabetes Program Recommendations: Correction (SSI): While inpatient and ordered steroids, please consider ordering CBGs with Novolog correction scale ACHS.  NOTE: Ordered Solucortef 50 mg Q6H which is contributing to hyperglycemia.   Thanks, Orlando PennerMarie Maritza Hosterman, RN, MSN, CDE Diabetes Coordinator Inpatient Diabetes Program [phone] (Team Pager from 8am to 5pm)

## 2017-03-20 NOTE — Progress Notes (Signed)
[employer] - [place] at [facility]   PATIENT NAME: Brandy Robinson    MR#:  [id number]  DATE OF BIRTH:  May 05, 1965  SUBJECTIVE:  CHIEF COMPLAINT:   Chief Complaint  Patient presents with  . Dizziness  Discussion with intensivist, possible regular nursing floor later today  REVIEW OF SYSTEMS:  CONSTITUTIONAL: No fever, fatigue or weakness.  EYES: No blurred or double vision.  EARS, NOSE, AND THROAT: No tinnitus or ear pain.  RESPIRATORY: No cough, shortness of breath, wheezing or hemoptysis.  CARDIOVASCULAR: No chest pain, orthopnea, edema.  GASTROINTESTINAL: No nausea, vomiting, diarrhea or abdominal pain.  GENITOURINARY: No dysuria, hematuria.  ENDOCRINE: No polyuria, nocturia,  HEMATOLOGY: No anemia, easy bruising or bleeding SKIN: No rash or lesion. MUSCULOSKELETAL: No joint pain or arthritis.   NEUROLOGIC: No tingling, numbness, weakness.  PSYCHIATRY: No anxiety or depression.   ROS  DRUG ALLERGIES:   Allergies  Allergen Reactions  . Hydrocodone Itching  . Aspirin Other (See Comments)    Reaction:  GI upset   . Morphine And Related Itching, Nausea And Vomiting and Other (See Comments)    Reaction:  Chest pain    VITALS:  Blood pressure 120/62, pulse (!) 119, temperature (!) 96.7 F (35.9 C), temperature source Axillary, resp. rate (!) 36, height 5\' 3"  (1.6 m), weight 78.7 kg (173 lb 8 oz), SpO2 (!) 89 %.  PHYSICAL EXAMINATION:  GENERAL:  52 y.o.-year-old patient lying in the bed with no acute distress.  EYES: Pupils equal, round, reactive to light and accommodation. No scleral icterus. Extraocular muscles intact.  HEENT: Head atraumatic, normocephalic. Oropharynx and nasopharynx clear.  NECK:  Supple, no jugular venous distention. No thyroid enlargement, no tenderness.  LUNGS: Normal breath sounds bilaterally, no wheezing, rales,rhonchi or crepitation. No use of accessory muscles of respiration.  CARDIOVASCULAR: S1, S2 normal. No murmurs, rubs,  or gallops.  ABDOMEN: Soft, nontender, nondistended. Bowel sounds present. No organomegaly or mass.  EXTREMITIES: No pedal edema, cyanosis, or clubbing.  NEUROLOGIC: Cranial nerves II through XII are intact. Muscle strength 5/5 in all extremities. Sensation intact. Gait not checked.  PSYCHIATRIC: The patient is alert and oriented x 3.  SKIN: No obvious rash, lesion, or ulcer.   Physical Exam LABORATORY PANEL:   CBC Recent Labs  Lab 03/19/17 0119  WBC 14.9*  HGB 11.1*  HCT 33.2*  PLT 242   ------------------------------------------------------------------------------------------------------------------  Chemistries  Recent Labs  Lab 03/20/17 0546  NA 138  K 3.3*  CL 107  CO2 20*  GLUCOSE 217*  BUN 12  CREATININE 0.72  CALCIUM 7.3*  MG 2.9*   ------------------------------------------------------------------------------------------------------------------  Cardiac Enzymes No results for input(s): TROPONINI in the last 168 hours. ------------------------------------------------------------------------------------------------------------------  RADIOLOGY:  Ct Angio Chest Pe W And/or Wo Contrast  Result Date: 03/18/2017 CLINICAL DATA:  Dizziness last night and today. Onset of fever, cough, nausea and pain today. EXAM: CT ANGIOGRAPHY CHEST WITH CONTRAST TECHNIQUE: Multidetector CT imaging of the chest was performed using the standard protocol during bolus administration of intravenous contrast. Multiplanar CT image reconstructions and MIPs were obtained to evaluate the vascular anatomy. CONTRAST:  75 ml ISOVUE-370 IOPAMIDOL (ISOVUE-370) INJECTION 76% COMPARISON:  CT chest 10/17/2016.  PA and lateral chest 11/26/2016. FINDINGS: Cardiovascular: No pulmonary embolus is identified. Heart size is normal. No pericardial effusion. Scattered calcific aortic atherosclerotic calcifications are identified. Mediastinum/Nodes: No pathologic lymphadenopathy by CT size criteria. Calcified  mediastinal and hilar lymph nodes are compatible with old granulomatous disease. Lungs/Pleura: Dense airspace disease throughout almost  the entire right middle lobe is consistent with pneumonia. There is some emphysematous change in the lungs. No nodule or mass is identified. Airways are unremarkable. Upper Abdomen: There is diffuse fatty infiltration of the liver. Musculoskeletal: Negative. Review of the MIP images confirms the above findings. IMPRESSION: Dense right middle lobe airspace disease consistent with pneumonia. Recommend follow-up to clearing. Negative for pulmonary embolus. Diffuse fatty infiltration of the liver. Aortic Atherosclerosis (ICD10-I70.0) and Emphysema (ICD10-J43.9). Electronically Signed   By: Drusilla Kannerhomas  Dalessio M.D.   On: 03/18/2017 21:27   [place] Abdomen Limited Ruq  Result Date: 03/18/2017 CLINICAL DATA:  Acute right upper quadrant abdominal pain. EXAM: ULTRASOUND ABDOMEN LIMITED RIGHT UPPER QUADRANT COMPARISON:  CT scan of February 07, 2017. FINDINGS: Gallbladder: No gallstones or wall thickening visualized. No sonographic Murphy sign noted by sonographer. Common bile duct: Diameter: 2.4 mm which is within normal limits. Liver: No focal lesion identified. Within normal limits in parenchymal echogenicity. Portal vein is patent on color Doppler imaging with normal direction of blood flow towards the liver. IMPRESSION: No abnormality seen in the right upper quadrant of the abdomen. Electronically Signed   By: Lupita RaiderJames  Green Jr, M.D.   On: 03/18/2017 20:42    ASSESSMENT AND PLAN:  1.Acute hypoxemic respiratory failure Resolving Secondary to acute pneumonia and COPD exacerbation Did require short stay in the ICU, for possible transfer to regular nursing floor bed later today, continue to wean O2 as tolerated, aggressive pulmonary toilet with bronchodilator therapy  2.  Acute sepsis Secondary to CAP Continue pneumonia protocol, empiric antibiotics, follow-up on cultures  3.   Acute CAP Stable  Continue pneumonia protocol, empiric antibiotics, and IV fluids for rehydration   4.  Acute on COPD exacerbation  Continue IV Solu-Medrol with tapering as tolerated, empiric doxycycline/Rocephin, aggressive pulmonary toilet with bronchodilator therapy, supplemental oxygen with weaning as tolerated  5.  Acute hypokalemia/hypomagnesemia Repleted  6.  Chronic bipolar illness  Stable  Continue current regiment   All the records are reviewed and case discussed with Care Management/Social Workerr. Management plans discussed with the patient, family and they are in agreement.  CODE STATUS: full  TOTAL TIME TAKING CARE OF THIS PATIENT: 35 minutes.     POSSIBLE D/C IN 1-3 DAYS, DEPENDING ON CLINICAL CONDITION.   Evelena AsaMontell D Salary M.D on 03/20/2017   Between 7am to 6pm - Pager - [phone]  After 6pm go to [email or web address] - Social research officer, governmentpassword EPAS [facility]  [facility] [place] [facility]  Office  [phone]  CC: Primary care physician; Center, [facility]  Note: This dictation was prepared with Dragon dictation along with smaller phrase technology. Any transcriptional errors that result from this process are unintentional.

## 2017-03-20 NOTE — Consult Note (Signed)
Name: Brandy Robinson MRN: [id number] DOB: March 12, 1965     CONSULTATION DATE: 03/18/2017  REFERRING MD : stafford  CHIEF COMPLAINT:SOB  HISTORY OF PRESENT ILLNESS: Feels better this AM Still very weak Down to [place] No fevers BP low this AM-given IVF's  Anti-infectives (From admission, onward)   Start     Dose/Rate Route Frequency Ordered Stop   03/19/17 1000  cefTRIAXone (ROCEPHIN) 1 g in sodium chloride 0.9 % 100 mL IVPB     1 g 200 mL/hr over 30 Minutes Intravenous Every 24 hours 03/19/17 0320     03/19/17 0115  doxycycline (VIBRAMYCIN) 100 mg in sodium chloride 0.9 % 250 mL IVPB     100 mg 125 mL/hr over 120 Minutes Intravenous Every 12 hours 03/19/17 0102     03/18/17 2245  azithromycin (ZITHROMAX) 500 mg in sodium chloride 0.9 % 250 mL IVPB  Status:  Discontinued     500 mg 250 mL/hr over 60 Minutes Intravenous  Once 03/18/17 2241 03/19/17 0210   03/18/17 2215  levofloxacin (LEVAQUIN) tablet 750 mg  Status:  Discontinued     750 mg Oral  Once 03/18/17 2211 03/18/17 2241   03/18/17 2215  cefTRIAXone (ROCEPHIN) 1 g in sodium chloride 0.9 % 100 mL IVPB     1 g 200 mL/hr over 30 Minutes Intravenous  Once 03/18/17 2211 03/18/17 2335       REVIEW OF SYSTEMS:   SOB, no fevers,  ALL OTHER ROS ARE NEGATIVE    VITAL SIGNS: Temp:  [97.8 F (36.6 C)-98.4 F (36.9 C)] 98.4 F (36.9 C) (03/03 2000) Pulse Rate:  [85-112] 96 (03/04 0630) Resp:  [18-36] 20 (03/04 0630) BP: (72-129)/(38-74) 89/47 (03/04 0630) SpO2:  [91 %-100 %] 98 % (03/04 0630) FiO2 (%):  [40 %-60 %] 40 % (03/03 2013)   Physical Examination:   GENERAL:NAD, no fevers, chills, + weakness + fatigue HEAD: Normocephalic, atraumatic.  EYES: Pupils equal, round, reactive to light. Extraocular muscles intact. No scleral icterus.  MOUTH: Moist mucosal membrane.   EAR, NOSE, THROAT: Clear without exudates. No external lesions.  NECK: Supple. No thyromegaly. No nodules. No JVD.  PULMONARY:CTA B/L + wheezes, no  crackles, no rhonchi CARDIOVASCULAR: S1 and S2. Regular rate and rhythm. No murmurs, rubs, or gallops. No edema.  GASTROINTESTINAL: Soft, nontender, nondistended. No masses. Positive bowel sounds.  MUSCULOSKELETAL: No swelling, clubbing, or edema. Range of motion full in all extremities.  NEUROLOGIC: Cranial nerves II through XII are intact. No gross focal neurological deficits.  SKIN: No ulceration, lesions, rashes, or cyanosis. Skin warm and dry. Turgor intact.  PSYCHIATRIC: Mood, affect within normal limits. The patient is awake, alert and oriented x 3. Insight, judgment intact.     Recent Labs  Lab 03/19/17 0119 03/19/17 1234 03/20/17 0546  NA 135 136 138  K 2.9* 3.6 3.3*  CL 103 104 107  CO2 20* 23 20*  BUN 7 9 12   CREATININE 0.81 0.85 0.72  GLUCOSE 97 231* 217*   Recent Labs  Lab 03/18/17 1639 03/19/17 0119  HGB 12.3 11.1*  HCT 35.8 33.2*  WBC 16.2* 14.9*  PLT 256 242   CT chest with RML opacity c/w pneumonia  ASSESSMENT / PLAN: 52 yo obese white female with hypoxic resp failure with RML pneumonia with sepsis With h/o smoking-will treat as presumed  COPD exacerbation  1.oxygen as needed 2.continue ABX 3.started IV steroids 4.NEB therapy 5.DVT prx 6.low BP-patient is fluid responsive  Lucie LeatherKurian David Muath Hallam, M.D.  [facility]  Medical Director [place] Director [facility] Cardio-Pulmonary Department

## 2017-03-20 NOTE — Progress Notes (Signed)
   03/20/17 0400  Vitals  BP (!) 81/48  MAP (mmHg) (!) 60  Pulse Rate 92  ECG Heart Rate 93  Resp 20  Oxygen Therapy  SpO2 94 %  Notified Tukov, NP.  Ordered 500ml NS bolus x1.

## 2017-03-20 NOTE — Progress Notes (Signed)
   03/20/17 0618  Vitals  BP (!) 72/38  MAP (mmHg) (!) 44  Pulse Rate 95  ECG Heart Rate 98  Resp (!) 24  Oxygen Therapy  SpO2 97 %  O2 Device Nasal Cannula  O2 Flow Rate (L/min) 5 L/min   Notified Luci Bankukov, NP at [phone].  Orders received.

## 2017-03-20 NOTE — Progress Notes (Signed)
Pharmacy Electrolyte Monitoring Consult:  Pharmacy consulted to assist in monitoring and replacing electrolytes in this 52 y.o. female admitted on 03/18/2017 with Dizziness   Labs:  Sodium (mmol/L)  Date Value  03/20/2017 138  05/15/2014 139   Potassium (mmol/L)  Date Value  03/20/2017 3.3 (L)  05/15/2014 4.0   Magnesium (mg/dL)  Date Value  16/10/960403/04/2017 2.9 (H)   Phosphorus (mg/dL)  Date Value  54/09/811903/04/2017 1.7 (L)   Calcium (mg/dL)  Date Value  14/78/295603/04/2017 7.3 (L)   Calcium, Total (mg/dL)  Date Value  21/30/865704/28/2016 9.3   Albumin (g/dL)  Date Value  84/69/629511/10/2016 3.4 (L)  05/15/2014 3.7    Plan: KCl 40 meq po once and Kphos neutral 500 mg bid x 2 and will recheck electrolytes in AM.   Brandy Robinson, Brandy Robinson D 03/20/2017 11:50 AM

## 2017-03-21 ENCOUNTER — Inpatient Hospital Stay: Payer: Self-pay

## 2017-03-21 DIAGNOSIS — F411 Generalized anxiety disorder: Secondary | ICD-10-CM

## 2017-03-21 LAB — URINALYSIS, COMPLETE (UACMP) WITH MICROSCOPIC
BILIRUBIN URINE: NEGATIVE
Glucose, UA: NEGATIVE mg/dL
Hgb urine dipstick: NEGATIVE
Ketones, ur: NEGATIVE mg/dL
Leukocytes, UA: NEGATIVE
Nitrite: NEGATIVE
Protein, ur: 30 mg/dL — AB
Specific Gravity, Urine: 1.018 (ref 1.005–1.030)
pH: 6 (ref 5.0–8.0)

## 2017-03-21 LAB — BASIC METABOLIC PANEL
Anion gap: 10 (ref 5–15)
BUN: 13 mg/dL (ref 6–20)
CHLORIDE: 110 mmol/L (ref 101–111)
CO2: 20 mmol/L — AB (ref 22–32)
Calcium: 7.2 mg/dL — ABNORMAL LOW (ref 8.9–10.3)
Creatinine, Ser: 0.69 mg/dL (ref 0.44–1.00)
GFR calc Af Amer: >60 mL/min (ref >60)
GFR calc non Af Amer: >60 mL/min (ref >60)
Glucose, Bld: 182 mg/dL — ABNORMAL HIGH (ref 65–99)
Potassium: 3.6 mmol/L (ref 3.5–5.1)
Sodium: 140 mmol/L (ref 135–145)

## 2017-03-21 LAB — GLUCOSE, CAPILLARY
GLUCOSE-CAPILLARY: 139 mg/dL — AB (ref 65–99)
Glucose-Capillary: 152 mg/dL — ABNORMAL HIGH (ref 65–99)
Glucose-Capillary: 121 mg/dL — ABNORMAL HIGH (ref 65–99)
Glucose-Capillary: 134 mg/dL — ABNORMAL HIGH (ref 65–99)

## 2017-03-21 LAB — PHOSPHORUS: Phosphorus: 1.9 mg/dL — ABNORMAL LOW (ref 2.5–4.6)

## 2017-03-21 LAB — BRAIN NATRIURETIC PEPTIDE: B NATRIURETIC PEPTIDE 5: 347 pg/mL — AB (ref 0.0–100.0)

## 2017-03-21 LAB — ALBUMIN: Albumin: 2.6 g/dL — ABNORMAL LOW (ref 3.5–5.0)

## 2017-03-21 MED ORDER — QUETIAPINE FUMARATE ER 200 MG PO TB24
200.0000 mg | ORAL_TABLET | Freq: Every day | ORAL | Status: DC
  Administered 2017-03-22 – 2017-03-24 (×3): 200 mg via ORAL
  Filled 2017-03-21 (×4): qty 1

## 2017-03-21 MED ORDER — CLONAZEPAM 0.5 MG PO TBDP
0.5000 mg | ORAL_TABLET | Freq: Every day | ORAL | Status: DC
  Administered 2017-03-21 – 2017-03-24 (×4): 0.5 mg via ORAL
  Filled 2017-03-21 (×4): qty 1

## 2017-03-21 MED ORDER — INFLUENZA VAC SPLIT QUAD 0.5 ML IM SUSY
0.5000 mL | PREFILLED_SYRINGE | INTRAMUSCULAR | Status: DC
  Filled 2017-03-21: qty 0.5

## 2017-03-21 MED ORDER — BUSPIRONE HCL 10 MG PO TABS
20.0000 mg | ORAL_TABLET | Freq: Two times a day (BID) | ORAL | Status: DC
  Administered 2017-03-21 – 2017-03-25 (×8): 20 mg via ORAL
  Filled 2017-03-21 (×9): qty 2

## 2017-03-21 MED ORDER — IPRATROPIUM-ALBUTEROL 0.5-2.5 (3) MG/3ML IN SOLN
3.0000 mL | Freq: Four times a day (QID) | RESPIRATORY_TRACT | Status: DC
  Administered 2017-03-21 – 2017-03-25 (×17): 3 mL via RESPIRATORY_TRACT
  Filled 2017-03-21 (×17): qty 3

## 2017-03-21 MED ORDER — QUETIAPINE FUMARATE ER 300 MG PO TB24
300.0000 mg | ORAL_TABLET | Freq: Every day | ORAL | Status: DC
  Administered 2017-03-21: 300 mg via ORAL
  Filled 2017-03-21: qty 1

## 2017-03-21 MED ORDER — DOXYCYCLINE HYCLATE 100 MG PO TABS
100.0000 mg | ORAL_TABLET | Freq: Two times a day (BID) | ORAL | Status: DC
  Administered 2017-03-21 – 2017-03-25 (×8): 100 mg via ORAL
  Filled 2017-03-21 (×8): qty 1

## 2017-03-21 MED ORDER — TRAZODONE HCL 100 MG PO TABS: 100.0000 mg | ORAL_TABLET | Freq: Every evening | ORAL | Status: DC | PRN

## 2017-03-21 MED ORDER — PNEUMOCOCCAL VAC POLYVALENT 25 MCG/0.5ML IJ INJ
0.5000 mL | INJECTION | INTRAMUSCULAR | Status: DC
  Filled 2017-03-21: qty 0.5

## 2017-03-21 MED ORDER — FUROSEMIDE 40 MG PO TABS
80.0000 mg | ORAL_TABLET | ORAL | Status: AC
  Administered 2017-03-21: 80 mg via ORAL
  Filled 2017-03-21: qty 2

## 2017-03-21 MED ORDER — POTASSIUM PHOSPHATE MONOBASIC 500 MG PO TABS
500.0000 mg | ORAL_TABLET | Freq: Three times a day (TID) | ORAL | Status: AC
  Administered 2017-03-21: 500 mg via ORAL
  Filled 2017-03-21 (×2): qty 1

## 2017-03-21 NOTE — Progress Notes (Signed)
Pharmacy Electrolyte Monitoring Consult:  Pharmacy consulted to assist in monitoring and replacing electrolytes in this 52 y.o. female admitted on 03/18/2017 with Dizziness   Labs:  Sodium (mmol/L)  Date Value  03/21/2017 140  05/15/2014 139   Potassium (mmol/L)  Date Value  03/21/2017 3.6  05/15/2014 4.0   Magnesium (mg/dL)  Date Value  40/98/119103/04/2017 2.9 (H)   Phosphorus (mg/dL)  Date Value  47/82/956203/05/2017 1.9 (L)   Calcium (mg/dL)  Date Value  13/08/657803/05/2017 7.2 (L)   Calcium, Total (mg/dL)  Date Value  46/96/295204/28/2016 9.3   Albumin (g/dL)  Date Value  84/13/244003/05/2017 2.6 (L)  05/15/2014 3.7    Plan: Will order KPhos 500mg  times 2 doses today, recheck electrolytes with AM labs.   Clovia CuffLisa Klaire Court, PharmD, BCPS 03/21/2017 11:24 AM

## 2017-03-21 NOTE — Progress Notes (Signed)
[employer] - [place] at [facility]   PATIENT NAME: Brandy Robinson    MR#:  [id number]  DATE OF BIRTH:  04-01-1965  SUBJECTIVE:  CHIEF COMPLAINT:   Chief Complaint  Patient presents with  . Dizziness  c/o panic/anxiety, denies chest pain/chest tightness/shortness of breath, no events overnight per nursing staff, psychiatry to see, continue to wean O2 off as tolerated  REVIEW OF SYSTEMS:  CONSTITUTIONAL: No fever, fatigue or weakness.  EYES: No blurred or double vision.  EARS, NOSE, AND THROAT: No tinnitus or ear pain.  RESPIRATORY: No cough, shortness of breath, wheezing or hemoptysis.  CARDIOVASCULAR: No chest pain, orthopnea, edema.  GASTROINTESTINAL: No nausea, vomiting, diarrhea or abdominal pain.  GENITOURINARY: No dysuria, hematuria.  ENDOCRINE: No polyuria, nocturia,  HEMATOLOGY: No anemia, easy bruising or bleeding SKIN: No rash or lesion. MUSCULOSKELETAL: No joint pain or arthritis.   NEUROLOGIC: No tingling, numbness, weakness.  PSYCHIATRY: No anxiety or depression.   ROS  DRUG ALLERGIES:   Allergies  Allergen Reactions  . Hydrocodone Itching  . Aspirin Other (See Comments)    Reaction:  GI upset   . Morphine And Related Itching, Nausea And Vomiting and Other (See Comments)    Reaction:  Chest pain    VITALS:  Blood pressure 131/66, pulse (!) 106, temperature (!) 97.5 F (36.4 C), temperature source Oral, resp. rate (!) 28, height 5\' 3"  (1.6 m), weight 83.5 kg (184 lb 1.4 oz), SpO2 90 %.  PHYSICAL EXAMINATION:  GENERAL:  52 y.o.-year-old patient lying in the bed with no acute distress.  EYES: Pupils equal, round, reactive to light and accommodation. No scleral icterus. Extraocular muscles intact.  HEENT: Head atraumatic, normocephalic. Oropharynx and nasopharynx clear.  NECK:  Supple, no jugular venous distention. No thyroid enlargement, no tenderness.  LUNGS: Normal breath sounds bilaterally, no wheezing, rales,rhonchi or crepitation. No  use of accessory muscles of respiration.  CARDIOVASCULAR: S1, S2 normal. No murmurs, rubs, or gallops.  ABDOMEN: Soft, nontender, nondistended. Bowel sounds present. No organomegaly or mass.  EXTREMITIES: No pedal edema, cyanosis, or clubbing.  NEUROLOGIC: Cranial nerves II through XII are intact. Muscle strength 5/5 in all extremities. Sensation intact. Gait not checked.  PSYCHIATRIC: The patient is alert and oriented x 3.  SKIN: No obvious rash, lesion, or ulcer.   Physical Exam LABORATORY PANEL:   CBC Recent Labs  Lab 03/19/17 0119  WBC 14.9*  HGB 11.1*  HCT 33.2*  PLT 242   ------------------------------------------------------------------------------------------------------------------  Chemistries  Recent Labs  Lab 03/20/17 0546 03/21/17 0543  NA 138 140  K 3.3* 3.6  CL 107 110  CO2 20* 20*  GLUCOSE 217* 182*  BUN 12 13  CREATININE 0.72 0.69  CALCIUM 7.3* 7.2*  MG 2.9*  --    ------------------------------------------------------------------------------------------------------------------  Cardiac Enzymes No results for input(s): TROPONINI in the last 168 hours. ------------------------------------------------------------------------------------------------------------------  RADIOLOGY:  No results found.  ASSESSMENT AND PLAN:  1.Acute hypoxemic respiratory failure Resolving slowly Secondary to acute pneumonia and COPD exacerbation Did require short stay in the ICU, wean O2 as tolerated, check two-view chest x-ray  2.Acute sepsis Resolved Secondary to CAP Continue pneumonia protocol, empiric doxycycline/Rocephin, and follow-up on outstanding cultures  3.Acute CAP Resolving   Continue pneumonia protocol, antibiotics per above, and follow-up on outstanding cultures  4.Acute on COPD exacerbation  Continue IV Solu-Medrol with tapering as tolerated, empiric doxycycline/Rocephin, inhaled corticosteroids twice daily, breathing treatments,  respiratory therapy following, wean O2 off as tolerated   5.Acute hypokalemia/hypomagnesemia Repleted  6.Chronic  bipolar illness  Stable  Continue current regiment  7.  Chronic anxiety/history of panic attacks Consult psychiatry for expert opinion   All the records are reviewed and case discussed with Care Management/Social Workerr. Management plans discussed with the patient, family and they are in agreement.  CODE STATUS: full  TOTAL TIME TAKING CARE OF THIS PATIENT: 35 minutes.     POSSIBLE D/C IN 1-3 DAYS, DEPENDING ON CLINICAL CONDITION.   Evelena AsaMontell D Salary M.D on 03/21/2017   Between 7am to 6pm - Pager - [phone]  After 6pm go to [email or web address] - Social research officer, governmentpassword [email or web address] [email or web address]  [facility]  Office  [phone]  CC: Primary care physician; Center, [facility]  Note: This dictation was prepared with Dragon dictation along with smaller phrase technology. Any transcriptional errors that result from this process are unintentional.

## 2017-03-21 NOTE — Consult Note (Signed)
[name] Face-to-Face Psychiatry Consult   Reason for Consult: Consult for 52 year old woman with a history of depression and anxiety currently in the hospital with pneumonia.  Question about medicine use Referring Physician: Salary Patient Identification: Brandy Robinson MRN:  [id number] Principal Diagnosis: Generalized anxiety disorder Diagnosis:   Patient Active Problem List   Diagnosis Date Noted  . Generalized anxiety disorder [F41.1] 03/21/2017  . CAP (community acquired pneumonia) [J18.9] 03/18/2017  . Acute respiratory failure ([place]) [J96.00]   . Perforated viscus [R19.8] 12/30/2014  . Lichen sclerosus [Q76.2] 09/24/2014  . Surgical menopause [E89.40] 09/24/2014  . Endometriosis [N80.9] 09/24/2014  . Bipolar 1 disorder ([place]) [F31.9] 09/24/2014  . Insomnia [G47.00] 09/24/2014  . GERD (gastroesophageal reflux disease) [K21.9] 09/24/2014  . History of stomach ulcers [Z87.19] 09/24/2014    Total Time spent with patient: 1 hour  Subjective:   Brandy Robinson is a 52 y.o. female patient admitted with "I am just so nervous".  HPI: Patient interviewed chart reviewed.  Old psychiatric notes particularly reviewed.  52 year old woman who presented to the emergency room with chest and abdomen discomfort was then found to have a pneumonia.  Patient is complaining of anxiety and difficulty sleeping.  Patient was cooperative with the interview.  Told me she is still having difficulty sleeping at night.  Also complains that her psychiatric medicines are not being given to her at her usual schedule which she thinks is making her more nervous.  Patient states that before coming into the hospital her mental health symptoms had been under good control.  Denied any recent depression.  Anxiety had been under good control.  Sleep was fine appetite was fine.  Completely denies any suicidal thought or psychosis.  Patient is followed up at [facility] and is routinely on Seroquel extended release 300 mg at night,  BuSpar 10 mg 4 times a day (although she usually takes it as 20 mg twice a day), clonazepam 0.5 mg at night and Wellbutrin 150 mg in the morning.  Social history: Patient lives at home with her husband.  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  Medical history: Currently has a pneumonia from which she is recovering.  Gastric reflux symptoms.  Substance abuse history: Denies any alcohol or drug abuse.  Looking back through the old chart there is no evidence or report of any substance abuse concerns previously.  Past Psychiatric History: Patient has had psychiatric hospitalizations in the past but the most recent one was in 2014.  No hospitalizations since then.  No history of suicide attempts or violence or psychosis.  Had been given a diagnosis of bipolar disorder but does not describe any clear history of psychotic mania.  She is followed up at [facility] and has been going there regularly and finds her current medicine satisfactory.  None of that is new.  Risk to Self: Is patient at risk for suicide?: No Risk to Others:   Prior Inpatient Therapy:   Prior Outpatient Therapy:    Past Medical History:  Past Medical History:  Diagnosis Date  . Anxiety   . Bipolar disorder ([place])   . Depression   . Disc disorder   . Dyspareunia   . Endometriosis   . GERD (gastroesophageal reflux disease)   . Insomnia   . Lichen   . Surgical menopause   . Vaginal atrophy   . Wears glasses     Past Surgical History:  Procedure Laterality Date  . [place]  tah-bso  . ARM WOUND REPAIR / CLOSURE  2013   cellulitis rt arm-i/d  . KNEE BURSECTOMY Left 09/24/2013   Procedure: IRRIGATION AND DEBRIDEMENT OF LEFT KNEE ;  Surgeon: Renette Butters, MD;  Location: [facility];  Service: Orthopedics;  Laterality: Left;  . LAPAROTOMY N/A 12/30/2014   Procedure: EXPLORATORY LAPAROTOMY-graham patch of peptic ulcer;  Surgeon: Clayburn Pert, MD;  Location: [facility];  Service:  General;  Laterality: N/A;   Family History:  Family History  Problem Relation Age of Onset  . Diabetes Mother   . Diabetes Sister   . Cancer Father        Throat  . Heart disease Neg Hx    Family Psychiatric  History: Does not know of any Social History:  Social History   Substance and Sexual Activity  Alcohol Use No     Social History   Substance and Sexual Activity  Drug Use No    Social History   Socioeconomic History  . Marital status: Married    Spouse name: None  . Number of children: None  . Years of education: None  . Highest education level: None  Social Needs  . Financial resource strain: None  . Food insecurity - worry: None  . Food insecurity - inability: None  . Transportation needs - medical: None  . Transportation needs - non-medical: None  Occupational History  . None  Tobacco Use  . Smoking status: Current Every Day Smoker    Packs/day: 1.00    Types: Cigarettes  . Smokeless tobacco: Never Used  Substance and Sexual Activity  . Alcohol use: No  . Drug use: No  . Sexual activity: Yes    Birth control/protection: Surgical  Other Topics Concern  . None  Social History Narrative  . None   Additional Social History:    Allergies:   Allergies  Allergen Reactions  . Hydrocodone Itching  . Aspirin Other (See Comments)    Reaction:  GI upset   . Morphine And Related Itching, Nausea And Vomiting and Other (See Comments)    Reaction:  Chest pain    Labs:  Results for orders placed or performed during the hospital encounter of 03/18/17 (from the past 48 hour(s))  Basic metabolic panel     Status: Abnormal   Collection Time: 03/20/17  5:46 AM  Result Value Ref Range   Sodium 138 135 - 145 mmol/L   Potassium 3.3 (L) 3.5 - 5.1 mmol/L   Chloride 107 101 - 111 mmol/L   CO2 20 (L) 22 - 32 mmol/L   Glucose, Bld 217 (H) 65 - 99 mg/dL   BUN 12 6 - 20 mg/dL   Creatinine, Ser 0.72 0.44 - 1.00 mg/dL   Calcium 7.3 (L) 8.9 - 10.3 mg/dL   GFR  calc non Af Amer >60 >60 mL/min   GFR calc Af Amer >60 >60 mL/min    Comment: (NOTE) The eGFR has been calculated using the CKD EPI equation. This calculation has not been validated in all clinical situations. eGFR's persistently <60 mL/min signify possible Chronic Kidney Disease.    Anion gap 11 5 - 15    Comment: Performed at [facility], [address] [place], [place] [address]  Magnesium     Status: Abnormal   Collection Time: 03/20/17  5:46 AM  Result Value Ref Range   Magnesium 2.9 (H) 1.7 - 2.4 mg/dL    Comment: Performed at [facility], [address] [place], [place] [address]  Phosphorus     Status: Abnormal   Collection Time: 03/20/17  5:46 AM  Result Value Ref Range   Phosphorus 1.7 (L) 2.5 - 4.6 mg/dL    Comment: Performed at [facility], [address] [place], [place] [address]  Glucose, capillary     Status: Abnormal   Collection Time: 03/20/17  8:21 AM  Result Value Ref Range   Glucose-Capillary 193 (H) 65 - 99 mg/dL  Glucose, capillary     Status: Abnormal   Collection Time: 03/20/17 12:13 PM  Result Value Ref Range   Glucose-Capillary 221 (H) 65 - 99 mg/dL   Comment 1 Notify RN   Glucose, capillary     Status: Abnormal   Collection Time: 03/20/17  5:13 PM  Result Value Ref Range   Glucose-Capillary 104 (H) 65 - 99 mg/dL  Glucose, capillary     Status: Abnormal   Collection Time: 03/20/17  9:28 PM  Result Value Ref Range   Glucose-Capillary 168 (H) 65 - 99 mg/dL  Urinalysis, Complete w Microscopic     Status: Abnormal   Collection Time: 03/20/17 11:39 PM  Result Value Ref Range   Color, Urine YELLOW (A) YELLOW   APPearance CLEAR (A) CLEAR   Specific Gravity, Urine 1.018 1.005 - 1.030   pH 6.0 5.0 - 8.0   Glucose, UA NEGATIVE NEGATIVE mg/dL   Hgb urine dipstick NEGATIVE NEGATIVE   Bilirubin Urine NEGATIVE NEGATIVE   Ketones, ur NEGATIVE NEGATIVE mg/dL   Protein, ur 30 (A) NEGATIVE mg/dL   Nitrite NEGATIVE NEGATIVE    Leukocytes, UA NEGATIVE NEGATIVE   RBC / HPF 0-5 0 - 5 RBC/hpf   WBC, UA 0-5 0 - 5 WBC/hpf   Bacteria, UA RARE (A) NONE SEEN   Squamous Epithelial / LPF 0-5 (A) NONE SEEN   Hyaline Casts, UA PRESENT     Comment: Performed at [facility], [address] [place], [place] [address]  Basic metabolic panel     Status: Abnormal   Collection Time: 03/21/17  5:43 AM  Result Value Ref Range   Sodium 140 135 - 145 mmol/L   Potassium 3.6 3.5 - 5.1 mmol/L   Chloride 110 101 - 111 mmol/L   CO2 20 (L) 22 - 32 mmol/L   Glucose, Bld 182 (H) 65 - 99 mg/dL   BUN 13 6 - 20 mg/dL   Creatinine, Ser 0.69 0.44 - 1.00 mg/dL   Calcium 7.2 (L) 8.9 - 10.3 mg/dL   GFR calc non Af Amer >60 >60 mL/min   GFR calc Af Amer >60 >60 mL/min    Comment: (NOTE) The eGFR has been calculated using the CKD EPI equation. This calculation has not been validated in all clinical situations. eGFR's persistently <60 mL/min signify possible Chronic Kidney Disease.    Anion gap 10 5 - 15    Comment: Performed at [facility], [address] [place], [place] [address]  Phosphorus     Status: Abnormal   Collection Time: 03/21/17  5:43 AM  Result Value Ref Range   Phosphorus 1.9 (L) 2.5 - 4.6 mg/dL    Comment: Performed at [facility], [address] [place], [place] [address]  Albumin     Status: Abnormal   Collection Time: 03/21/17  5:43 AM  Result Value Ref Range   Albumin 2.6 (L) 3.5 - 5.0 g/dL    Comment: Performed at [facility], [place], [place]  [address]  Brain natriuretic peptide     Status: Abnormal   Collection Time: 03/21/17  5:44 AM  Result Value Ref Range   B Natriuretic Peptide 347.0 (H) 0.0 - 100.0 pg/mL    Comment: Performed at [facility], [address] [place], [place] [address]  Glucose, capillary     Status: Abnormal   Collection Time: 03/21/17  7:31 AM  Result Value Ref Range   Glucose-Capillary 139 (H) 65 - 99 mg/dL    Comment 1 Notify RN   Glucose, capillary     Status: Abnormal   Collection Time: 03/21/17 11:35 AM  Result Value Ref Range   Glucose-Capillary 152 (H) 65 - 99 mg/dL   Comment 1 Notify RN   Glucose, capillary     Status: Abnormal   Collection Time: 03/21/17  5:04 PM  Result Value Ref Range   Glucose-Capillary 121 (H) 65 - 99 mg/dL    Current Facility-Administered Medications  Medication Dose Route Frequency Provider Last Rate Last Dose  . acetaminophen (TYLENOL) tablet 650 mg  650 mg Oral Q6H PRN Amelia Jo, MD   650 mg at 03/20/17 2232   Or  . acetaminophen (TYLENOL) suppository 650 mg  650 mg Rectal Q6H PRN Amelia Jo, MD      . bisacodyl (DULCOLAX) EC tablet 5 mg  5 mg Oral Daily PRN Amelia Jo, MD      . budesonide (PULMICORT) nebulizer solution 0.5 mg  0.5 mg Nebulization BID Flora Lipps, MD   0.5 mg at 03/21/17 0736  . buPROPion (WELLBUTRIN SR) 12 hr tablet 150 mg  150 mg Oral Daily Amelia Jo, MD   150 mg at 03/21/17 0916  . busPIRone (BUSPAR) tablet 20 mg  20 mg Oral BID Clapacs, John T, MD      . cefTRIAXone (ROCEPHIN) 1 g in sodium chloride 0.9 % 100 mL IVPB  1 g Intravenous Q24H Amelia Jo, MD   Stopped at 03/21/17 1032  . clonazePAM (KLONOPIN) disintegrating tablet 0.5 mg  0.5 mg Oral QHS Clapacs, John T, MD      . conjugated estrogens (PREMARIN) vaginal cream 0.5 Applicatorful  0.5 Applicatorful Vaginal Q M,W,F Amelia Jo, MD      . docusate sodium (COLACE) capsule 100 mg  100 mg Oral BID Amelia Jo, MD   100 mg at 03/20/17 2231  . doxycycline (VIBRA-TABS) tablet 100 mg  100 mg Oral Q12H Salary, Montell D, MD      . DULoxetine (CYMBALTA) DR capsule 30 mg  30 mg Oral TID Amelia Jo, MD   30 mg at 03/21/17 1557  . estradiol (ESTRACE) tablet 1.5 mg  1.5 mg Oral Daily Amelia Jo, MD   1.5 mg at 03/21/17 0916  . heparin injection 5,000 Units  5,000 Units Subcutaneous Q8H Amelia Jo, MD   5,000 Units at 03/21/17 [phone]  . hydrocortisone sodium succinate  (SOLU-CORTEF) 100 MG injection 50 mg  50 mg Intravenous Q6H Tukov, Magadalene S, NP   50 mg at 03/21/17 1233  . ibuprofen (ADVIL,MOTRIN) tablet 400 mg  400 mg Oral Q6H PRN Dorene Sorrow S, NP   400 mg at 03/20/17 1929  . [START ON 03/22/2017] Influenza vac split quadrivalent PF (FLUARIX) injection 0.5 mL  0.5 mL Intramuscular Tomorrow-1000 Salary, Montell D, MD      . insulin aspart (novoLOG) injection 0-5 Units  0-5 Units Subcutaneous QHS Kasa, Kurian, MD      . insulin aspart (novoLOG) injection 0-9 Units  0-9 Units Subcutaneous  TID WC Flora Lipps, MD   2 Units at 03/21/17 1232  . ipratropium-albuterol (DUONEB) 0.5-2.5 (3) MG/3ML nebulizer solution 3 mL  3 mL Nebulization QID Salary, Montell D, MD   3 mL at 03/21/17 1620  . ondansetron (ZOFRAN) tablet 4 mg  4 mg Oral Q6H PRN Amelia Jo, MD       Or  . ondansetron [facility]) injection 4 mg  4 mg Intravenous Q6H PRN Amelia Jo, MD   4 mg at 03/21/17 1415  . pantoprazole (PROTONIX) EC tablet 40 mg  40 mg Oral Daily Amelia Jo, MD   40 mg at 03/21/17 0916  . [START ON 03/22/2017] pneumococcal 23 valent vaccine (PNU-IMMUNE) injection 0.5 mL  0.5 mL Intramuscular Tomorrow-1000 Salary, Montell D, MD      . potassium phosphate (monobasic) (K-PHOS ORIGINAL) tablet 500 mg  500 mg Oral TID WC Vira Blanco, RPH   500 mg at 03/21/17 1232  . QUEtiapine (SEROQUEL XR) 24 hr tablet 300 mg  300 mg Oral QHS Clapacs, John T, MD      . traZODone (DESYREL) tablet 100 mg  100 mg Oral QHS PRN Clapacs, Madie Reno, MD        Musculoskeletal: Strength & Muscle Tone: within normal limits Gait & Station: normal Patient leans: N/A  Psychiatric Specialty Exam: Physical Exam  Nursing note and vitals reviewed. Constitutional: She appears well-developed and well-nourished.  HENT:  Head: Normocephalic and atraumatic.  Eyes: Conjunctivae are normal. Pupils are equal, round, and reactive to light.  Neck: Normal range of motion.  Cardiovascular: Regular rhythm and  normal heart sounds.  Respiratory: She is in respiratory distress.  GI: Soft.  Musculoskeletal: Normal range of motion.  Neurological: She is alert.  Skin: Skin is warm and dry.  Psychiatric: Her speech is normal. Judgment normal. Her mood appears anxious. She is agitated. She is not aggressive. Thought content is not paranoid. Cognition and memory are normal. Cognition and memory are not impaired. She expresses no homicidal and no suicidal ideation.    Review of Systems  Constitutional: Negative.   HENT: Negative.   Eyes: Negative.   Respiratory: Positive for shortness of breath.   Cardiovascular: Negative.   Gastrointestinal: Negative.   Musculoskeletal: Negative.   Skin: Negative.   Neurological: Negative.   Psychiatric/Behavioral: Negative for depression, hallucinations, memory loss, substance abuse and suicidal ideas. The patient is nervous/anxious and has insomnia.     Blood pressure 131/66, pulse (!) 106, temperature (!) 97.5 F (36.4 C), temperature source Oral, resp. rate (!) 28, height 5' 3" (1.6 m), weight 184 lb 1.4 oz (83.5 kg), SpO2 90 %.Body mass index is 32.61 kg/m.  General Appearance: Casual  Eye Contact:  Good  Speech:  Clear and Coherent  Volume:  Normal  Mood:  Anxious  Affect:  Congruent  Thought Process:  Goal Directed  Orientation:  Full (Time, Place, and Person)  Thought Content:  Logical  Suicidal Thoughts:  No  Homicidal Thoughts:  No  Memory:  Immediate;   Fair Recent;   Fair Remote;   Fair  Judgement:  Fair  Insight:  Fair  Psychomotor Activity:  Normal  Concentration:  Concentration: Fair  Recall:  [employer] of Knowledge:  Fair  Language:  Fair  Akathisia:  No  Handed:  Right  AIMS (if indicated):     Assets:  Communication Skills Desire for Improvement Housing Resilience Social Support  ADL's:  Intact  Cognition:  WNL  Sleep:  Treatment Plan Summary: Daily contact with patient to assess and evaluate symptoms and progress  in treatment, Medication management and Plan 51 year old woman currently in the hospital with a pneumonia who has chronic anxiety and depression problems.  She is nervous about her medicine being dispensed differently than how she normally takes it at home.  Patient does not have a history of substance abuse and there does not seem to be any reason not to give her her usual psychiatric medicine as she takes it at home.  Rather than anything being done PRN I am changing all of her medicine back to her usual schedule of clonazepam 0.5 mg nightly, BuSpar 20 mg twice a day, Seroquel extended release 300 mg at night and Wellbutrin 150 mg in the morning.  Also adding trazodone at 100 mg at night as needed if she is still not able to sleep with her current medicine.  I do not think there is much risk of any of that impairing her breathing.  Patient agreeable to plan.  Spoke with nursing.  I will follow up just as needed.  Disposition: No evidence of imminent risk to self or others at present.   Patient does not meet criteria for psychiatric inpatient admission. Supportive therapy provided about ongoing stressors.  Alethia Berthold, MD 03/21/2017 5:48 PM

## 2017-03-22 ENCOUNTER — Inpatient Hospital Stay: Payer: Self-pay

## 2017-03-22 ENCOUNTER — Inpatient Hospital Stay
Admit: 2017-03-22 | Discharge: 2017-03-22 | Disposition: A | Discharge status: Home / Self Care | Payer: Self-pay | Attending: Family Medicine | Admitting: Family Medicine

## 2017-03-22 LAB — BLOOD GAS, ARTERIAL
ACID-BASE EXCESS: 3.2 mmol/L — AB (ref 0.0–2.0)
BICARBONATE: 26.9 mmol/L (ref 20.0–28.0)
FIO2: 0.55
O2 Saturation: 87.5 %
PCO2 ART: 37 mmHg (ref 32.0–48.0)
PO2 ART: 50 mmHg — AB (ref 83.0–108.0)
Patient temperature: 37
pH, Arterial: 7.47 — ABNORMAL HIGH (ref 7.350–7.450)

## 2017-03-22 LAB — BASIC METABOLIC PANEL
Anion gap: 11 (ref 5–15)
BUN: 10 mg/dL (ref 6–20)
CHLORIDE: 103 mmol/L (ref 101–111)
CO2: 27 mmol/L (ref 22–32)
CREATININE: 0.63 mg/dL (ref 0.44–1.00)
Calcium: 7.5 mg/dL — ABNORMAL LOW (ref 8.9–10.3)
GFR calc Af Amer: >60 mL/min (ref >60)
GFR calc non Af Amer: >60 mL/min (ref >60)
Glucose, Bld: 130 mg/dL — ABNORMAL HIGH (ref 65–99)
Potassium: 3.3 mmol/L — ABNORMAL LOW (ref 3.5–5.1)
SODIUM: 141 mmol/L (ref 135–145)

## 2017-03-22 LAB — CBC WITH DIFFERENTIAL/PLATELET
Basophils Absolute: 0 10*3/uL (ref 0–0.1)
Basophils Relative: 0 %
EOS ABS: 0 10*3/uL (ref 0–0.7)
Eosinophils Relative: 0 %
HCT: 33 % — ABNORMAL LOW (ref 35.0–47.0)
HEMOGLOBIN: 10.9 g/dL — AB (ref 12.0–16.0)
LYMPHS ABS: 1.6 10*3/uL (ref 1.0–3.6)
Lymphocytes Relative: 16 %
MCH: 30 pg (ref 26.0–34.0)
MCHC: 33.1 g/dL (ref 32.0–36.0)
MCV: 90.6 fL (ref 80.0–100.0)
Monocytes Absolute: 0.7 10*3/uL (ref 0.2–0.9)
Monocytes Relative: 7 %
NEUTROS PCT: 77 %
Neutro Abs: 7.8 10*3/uL — ABNORMAL HIGH (ref 1.4–6.5)
Platelets: 281 10*3/uL (ref 150–440)
RBC: 3.64 MIL/uL — AB (ref 3.80–5.20)
RDW: 15 % — ABNORMAL HIGH (ref 11.5–14.5)
WBC: 10.1 10*3/uL (ref 3.6–11.0)

## 2017-03-22 LAB — GLUCOSE, CAPILLARY
GLUCOSE-CAPILLARY: 118 mg/dL — AB (ref 65–99)
GLUCOSE-CAPILLARY: 146 mg/dL — AB (ref 65–99)
Glucose-Capillary: 130 mg/dL — ABNORMAL HIGH (ref 65–99)
Glucose-Capillary: 127 mg/dL — ABNORMAL HIGH (ref 65–99)

## 2017-03-22 LAB — PHOSPHORUS: Phosphorus: 2 mg/dL — ABNORMAL LOW (ref 2.5–4.6)

## 2017-03-22 LAB — PROCALCITONIN: Procalcitonin: 1.64 ng/mL

## 2017-03-22 MED ORDER — POTASSIUM PHOSPHATE MONOBASIC 500 MG PO TABS
500.0000 mg | ORAL_TABLET | Freq: Three times a day (TID) | ORAL | Status: AC
  Administered 2017-03-22 – 2017-03-23 (×3): 500 mg via ORAL
  Filled 2017-03-22 (×3): qty 1

## 2017-03-22 MED ORDER — IOPAMIDOL (ISOVUE-370) INJECTION 76%
75.0000 mL | Freq: Once | INTRAVENOUS | Status: AC | PRN
  Administered 2017-03-22: 75 mL via INTRAVENOUS

## 2017-03-22 MED ORDER — CLONAZEPAM 0.5 MG PO TABS
0.2500 mg | ORAL_TABLET | Freq: Three times a day (TID) | ORAL | Status: DC | PRN
  Administered 2017-03-22 – 2017-03-23 (×2): 0.25 mg via ORAL
  Filled 2017-03-22 (×4): qty 1

## 2017-03-22 MED ORDER — FUROSEMIDE 10 MG/ML IJ SOLN
20.0000 mg | Freq: Two times a day (BID) | INTRAMUSCULAR | Status: DC
  Administered 2017-03-22 – 2017-03-24 (×4): 20 mg via INTRAVENOUS
  Filled 2017-03-22 (×4): qty 4

## 2017-03-22 MED ORDER — IOPAMIDOL (ISOVUE-300) INJECTION 61%: 75.0000 mL | Freq: Once | INTRAVENOUS | Status: DC | PRN

## 2017-03-22 MED ORDER — HYDROCORTISONE NA SUCCINATE PF 100 MG IJ SOLR
100.0000 mg | Freq: Four times a day (QID) | INTRAMUSCULAR | Status: AC
  Administered 2017-03-22 – 2017-03-23 (×3): 100 mg via INTRAVENOUS
  Filled 2017-03-22 (×3): qty 2

## 2017-03-22 MED ORDER — POTASSIUM CHLORIDE 20 MEQ PO PACK
40.0000 meq | PACK | Freq: Once | ORAL | Status: AC
  Administered 2017-03-22: 40 meq via ORAL
  Filled 2017-03-22: qty 2

## 2017-03-22 MED ORDER — FUROSEMIDE 10 MG/ML IJ SOLN
40.0000 mg | Freq: Two times a day (BID) | INTRAMUSCULAR | Status: DC
  Administered 2017-03-22: 40 mg via INTRAVENOUS
  Filled 2017-03-22: qty 4

## 2017-03-22 MED ORDER — POTASSIUM CHLORIDE 20 MEQ PO PACK
40.0000 meq | PACK | Freq: Every day | ORAL | Status: DC
  Administered 2017-03-23 – 2017-03-24 (×2): 40 meq via ORAL
  Filled 2017-03-22 (×3): qty 2

## 2017-03-22 MED ORDER — CARVEDILOL 6.25 MG PO TABS
6.2500 mg | ORAL_TABLET | Freq: Two times a day (BID) | ORAL | Status: DC
  Administered 2017-03-22 (×2): 6.25 mg via ORAL
  Filled 2017-03-22 (×3): qty 1

## 2017-03-22 NOTE — Consult Note (Signed)
[facility] Psychiatry Consult   Reason for Consult: Follow-up consult 52 year old woman with a history of anxiety. Referring Physician: Salary Patient Identification: Brandy Robinson MRN:  [id number] Principal Diagnosis: Generalized anxiety disorder Diagnosis:   Patient Active Problem List   Diagnosis Date Noted  . Generalized anxiety disorder [F41.1] 03/21/2017  . CAP (community acquired pneumonia) [J18.9] 03/18/2017  . Acute respiratory failure ([place]) [J96.00]   . Perforated viscus [R19.8] 12/30/2014  . Lichen sclerosus [W09.8] 09/24/2014  . Surgical menopause [E89.40] 09/24/2014  . Endometriosis [N80.9] 09/24/2014  . Bipolar 1 disorder ([place]) [F31.9] 09/24/2014  . Insomnia [G47.00] 09/24/2014  . GERD (gastroesophageal reflux disease) [K21.9] 09/24/2014  . History of stomach ulcers [Z87.19] 09/24/2014    Total Time spent with patient: 30 minutes  Subjective:   Brandy Robinson is a 52 y.o. female patient admitted with "I am having a bad day".  HPI: Patient interviewed chart reviewed.  This 52 year old woman was seen yesterday for her anxiety and was put back on her medication the same as she is taking it at home.  On interview today the patient says she is very anxious.  Apparently she went to have a CT scan today and during that time desaturated door had some trouble breathing which put her into a panic attack.  She is having trouble coming out of bed and is still feeling nervous and panicky.  No suicidality no evidence psychosis.  Past Psychiatric History: See previous notes with a history of anxiety and depression and chronic medication use.  Risk to Self: Is patient at risk for suicide?: No Risk to Others:   Prior Inpatient Therapy:   Prior Outpatient Therapy:    Past Medical History:  Past Medical History:  Diagnosis Date  . Anxiety   . Bipolar disorder ([place])   . Depression   . Disc disorder   . Dyspareunia   . Endometriosis   . GERD (gastroesophageal reflux  disease)   . Insomnia   . Lichen   . Surgical menopause   . Vaginal atrophy   . Wears glasses     Past Surgical History:  Procedure Laterality Date  . ABDOMINAL HYSTERECTOMY  1995   [facility]-bso  . ARM WOUND REPAIR / CLOSURE  2013   cellulitis rt arm-i/d  . KNEE BURSECTOMY Left 09/24/2013   Procedure: IRRIGATION AND DEBRIDEMENT OF LEFT KNEE ;  Surgeon: Renette Butters, MD;  Location: [facility];  Service: Orthopedics;  Laterality: Left;  . LAPAROTOMY N/A 12/30/2014   Procedure: EXPLORATORY LAPAROTOMY-graham patch of peptic ulcer;  Surgeon: Clayburn Pert, MD;  Location: [facility];  Service: General;  Laterality: N/A;   Family History:  Family History  Problem Relation Age of Onset  . Diabetes Mother   . Diabetes Sister   . Cancer Father        Throat  . Heart disease Neg Hx    Family Psychiatric  History: Anxiety Social History:  Social History   Substance and Sexual Activity  Alcohol Use No     Social History   Substance and Sexual Activity  Drug Use No    Social History   Socioeconomic History  . Marital status: Married    Spouse name: None  . Number of children: None  . Years of education: None  . Highest education level: None  Social Needs  . Financial resource strain: None  . Food insecurity - worry: None  . Food insecurity - inability: None  . Transportation needs - medical:  None  . Transportation needs - non-medical: None  Occupational History  . None  Tobacco Use  . Smoking status: Current Every Day Smoker    Packs/day: 1.00    Types: Cigarettes  . Smokeless tobacco: Never Used  Substance and Sexual Activity  . Alcohol use: No  . Drug use: No  . Sexual activity: Yes    Birth control/protection: Surgical  Other Topics Concern  . None  Social History Narrative  . None   Additional Social History:    Allergies:   Allergies  Allergen Reactions  . Hydrocodone Itching  . Aspirin Other (See Comments)    Reaction:  GI upset   .  Morphine And Related Itching, Nausea And Vomiting and Other (See Comments)    Reaction:  Chest pain    Labs:  Results for orders placed or performed during the hospital encounter of 03/18/17 (from the past 48 hour(s))  Glucose, capillary     Status: Abnormal   Collection Time: 03/20/17  5:13 PM  Result Value Ref Range   Glucose-Capillary 104 (H) 65 - 99 mg/dL  Glucose, capillary     Status: Abnormal   Collection Time: 03/20/17  9:28 PM  Result Value Ref Range   Glucose-Capillary 168 (H) 65 - 99 mg/dL  Urinalysis, Complete w Microscopic     Status: Abnormal   Collection Time: 03/20/17 11:39 PM  Result Value Ref Range   Color, Urine YELLOW (A) YELLOW   APPearance CLEAR (A) CLEAR   Specific Gravity, Urine 1.018 1.005 - 1.030   pH 6.0 5.0 - 8.0   Glucose, UA NEGATIVE NEGATIVE mg/dL   Hgb urine dipstick NEGATIVE NEGATIVE   Bilirubin Urine NEGATIVE NEGATIVE   Ketones, ur NEGATIVE NEGATIVE mg/dL   Protein, ur 30 (A) NEGATIVE mg/dL   Nitrite NEGATIVE NEGATIVE   Leukocytes, UA NEGATIVE NEGATIVE   RBC / HPF 0-5 0 - 5 RBC/hpf   WBC, UA 0-5 0 - 5 WBC/hpf   Bacteria, UA RARE (A) NONE SEEN   Squamous Epithelial / LPF 0-5 (A) NONE SEEN   Hyaline Casts, UA PRESENT     Comment: Performed at [facility], [address] [place], [place] [address]  Basic metabolic panel     Status: Abnormal   Collection Time: 03/21/17  5:43 AM  Result Value Ref Range   Sodium 140 135 - 145 mmol/L   Potassium 3.6 3.5 - 5.1 mmol/L   Chloride 110 101 - 111 mmol/L   CO2 20 (L) 22 - 32 mmol/L   Glucose, Bld 182 (H) 65 - 99 mg/dL   BUN 13 6 - 20 mg/dL   Creatinine, Ser 0.69 0.44 - 1.00 mg/dL   Calcium 7.2 (L) 8.9 - 10.3 mg/dL   GFR calc non Af Amer >60 >60 mL/min   GFR calc Af Amer >60 >60 mL/min    Comment: (NOTE) The eGFR has been calculated using the CKD EPI equation. This calculation has not been validated in all clinical situations. eGFR's persistently <60 mL/min signify possible Chronic  Kidney Disease.    Anion gap 10 5 - 15    Comment: Performed at [facility], [address] [place], [place] [address]  Phosphorus     Status: Abnormal   Collection Time: 03/21/17  5:43 AM  Result Value Ref Range   Phosphorus 1.9 (L) 2.5 - 4.6 mg/dL    Comment: Performed at [facility], [address] [place], [place] [address]  Albumin  Status: Abnormal   Collection Time: 03/21/17  5:43 AM  Result Value Ref Range   Albumin 2.6 (L) 3.5 - 5.0 g/dL    Comment: Performed at [facility], [address] [place], [place] [address]  Brain natriuretic peptide     Status: Abnormal   Collection Time: 03/21/17  5:44 AM  Result Value Ref Range   B Natriuretic Peptide 347.0 (H) 0.0 - 100.0 pg/mL    Comment: Performed at [facility], [address] [place], [place] [address]  Glucose, capillary     Status: Abnormal   Collection Time: 03/21/17  7:31 AM  Result Value Ref Range   Glucose-Capillary 139 (H) 65 - 99 mg/dL   Comment 1 Notify RN   Glucose, capillary     Status: Abnormal   Collection Time: 03/21/17 11:35 AM  Result Value Ref Range   Glucose-Capillary 152 (H) 65 - 99 mg/dL   Comment 1 Notify RN   Glucose, capillary     Status: Abnormal   Collection Time: 03/21/17  5:04 PM  Result Value Ref Range   Glucose-Capillary 121 (H) 65 - 99 mg/dL  Glucose, capillary     Status: Abnormal   Collection Time: 03/21/17  9:14 PM  Result Value Ref Range   Glucose-Capillary 134 (H) 65 - 99 mg/dL   Comment 1 Notify RN   Blood gas, arterial     Status: Abnormal   Collection Time: 03/21/17 11:00 PM  Result Value Ref Range   FIO2 0.55    pH, Arterial 7.47 (H) 7.350 - 7.450   pCO2 arterial 37 32.0 - 48.0 mmHg   pO2, Arterial 50 (L) 83.0 - 108.0 mmHg   Bicarbonate 26.9 20.0 - 28.0 mmol/L   Acid-Base Excess 3.2 (H) 0.0 - 2.0 mmol/L   O2 Saturation 87.5 %   Patient temperature 37.0    Collection site RIGHT RADIAL    Sample type ARTERIAL DRAW     Allens test (pass/fail) PASS PASS    Comment: Performed at [facility], [address] [place], [place] [address]  Basic metabolic panel     Status: Abnormal   Collection Time: 03/22/17  5:04 AM  Result Value Ref Range   Sodium 141 135 - 145 mmol/L   Potassium 3.3 (L) 3.5 - 5.1 mmol/L   Chloride 103 101 - 111 mmol/L   CO2 27 22 - 32 mmol/L   Glucose, Bld 130 (H) 65 - 99 mg/dL   BUN 10 6 - 20 mg/dL   Creatinine, Ser 0.63 0.44 - 1.00 mg/dL   Calcium 7.5 (L) 8.9 - 10.3 mg/dL   GFR calc non Af Amer >60 >60 mL/min   GFR calc Af Amer >60 >60 mL/min    Comment: (NOTE) The eGFR has been calculated using the CKD EPI equation. This calculation has not been validated in all clinical situations. eGFR's persistently <60 mL/min signify possible Chronic Kidney Disease.    Anion gap 11 5 - 15    Comment: Performed at [facility], [address] [place], [place] [address]  Phosphorus     Status: Abnormal   Collection Time: 03/22/17  5:04 AM  Result Value Ref Range   Phosphorus 2.0 (L) 2.5 - 4.6 mg/dL    Comment: Performed at [facility], [address] [place],  [address]  Glucose, capillary     Status: Abnormal   Collection Time: 03/22/17  7:41 AM  Result Value Ref Range   Glucose-Capillary 130 (H)  65 - 99 mg/dL   Comment 1 Notify RN   Procalcitonin - Baseline     Status: None   Collection Time: 03/22/17  8:21 AM  Result Value Ref Range   Procalcitonin 1.64 ng/mL    Comment:        Interpretation: PCT > 0.5 ng/mL and <= 2 ng/mL: Systemic infection (sepsis) is possible, but other conditions are known to elevate PCT as well. (NOTE)       Sepsis PCT Algorithm           Lower Respiratory Tract                                      Infection PCT Algorithm    ----------------------------     ----------------------------         PCT < 0.25 ng/mL                PCT < 0.10 ng/mL         Strongly encourage             Strongly discourage    discontinuation of antibiotics    initiation of antibiotics    ----------------------------     -----------------------------       PCT 0.25 - 0.50 ng/mL            PCT 0.10 - 0.25 ng/mL               OR       >80% decrease in PCT            Discourage initiation of                                            antibiotics      Encourage discontinuation           of antibiotics    ----------------------------     -----------------------------         PCT >= 0.50 ng/mL              PCT 0.26 - 0.50 ng/mL                AND       <80% decrease in PCT             Encourage initiation of                                             antibiotics       Encourage continuation           of antibiotics    ----------------------------     -----------------------------        PCT >= 0.50 ng/mL                  PCT > 0.50 ng/mL               AND         increase in PCT                  Strongly encourage  initiation of antibiotics    Strongly encourage escalation           of antibiotics                                     -----------------------------                                           PCT <= 0.25 ng/mL                                                 OR                                        > 80% decrease in PCT                                     Discontinue / Do not initiate                                             antibiotics Performed at [facility], [address] [place], [place] [address]   CBC with Differential/Platelet     Status: Abnormal   Collection Time: 03/22/17  8:21 AM  Result Value Ref Range   WBC 10.1 3.6 - 11.0 K/uL   RBC 3.64 (L) 3.80 - 5.20 MIL/uL   Hemoglobin 10.9 (L) 12.0 - 16.0 g/dL   HCT 33.0 (L) 35.0 - 47.0 %   MCV 90.6 80.0 - 100.0 fL   MCH 30.0 26.0 - 34.0 pg   MCHC 33.1 32.0 - 36.0 g/dL   RDW 15.0 (H) 11.5 - 14.5 %   Platelets 281 150 - 440 K/uL   Neutrophils Relative % 77 %   Neutro Abs 7.8 (H) 1.4 -  6.5 K/uL   Lymphocytes Relative 16 %   Lymphs Abs 1.6 1.0 - 3.6 K/uL   Monocytes Relative 7 %   Monocytes Absolute 0.7 0.2 - 0.9 K/uL   Eosinophils Relative 0 %   Eosinophils Absolute 0.0 0 - 0.7 K/uL   Basophils Relative 0 %   Basophils Absolute 0.0 0 - 0.1 K/uL    Comment: Performed at [facility], [address] [place], [place] [address]  Glucose, capillary     Status: Abnormal   Collection Time: 03/22/17 12:00 PM  Result Value Ref Range   Glucose-Capillary 118 (H) 65 - 99 mg/dL  Glucose, capillary     Status: Abnormal   Collection Time: 03/22/17  4:49 PM  Result Value Ref Range   Glucose-Capillary 146 (H) 65 - 99 mg/dL    Current Facility-Administered Medications  Medication Dose Route Frequency Provider Last Rate Last Dose  . acetaminophen (TYLENOL) tablet 650 mg  650 mg Oral Q6H PRN Amelia Jo, MD   650 mg at 03/22/17 0029   Or  . acetaminophen (TYLENOL) suppository 650 mg  650 mg Rectal Q6H PRN Amelia Jo, MD      . bisacodyl (DULCOLAX) EC tablet 5 mg  5 mg Oral Daily PRN Amelia Jo, MD      . budesonide (PULMICORT) nebulizer solution 0.5 mg  0.5 mg Nebulization BID Flora Lipps, MD   0.5 mg at 03/22/17 0731  . buPROPion (WELLBUTRIN SR) 12 hr tablet 150 mg  150 mg Oral Daily Amelia Jo, MD   150 mg at 03/22/17 1015  . busPIRone (BUSPAR) tablet 20 mg  20 mg Oral BID Clapacs, Madie Reno, MD   20 mg at 03/22/17 1015  . carvedilol (COREG) tablet 6.25 mg  6.25 mg Oral BID WC Salary, Montell D, MD   6.25 mg at 03/22/17 1649  . cefTRIAXone (ROCEPHIN) 1 g in sodium chloride 0.9 % 100 mL IVPB  1 g Intravenous Q24H Amelia Jo, MD   Stopped at 03/22/17 1101  . clonazePAM (KLONOPIN) disintegrating tablet 0.5 mg  0.5 mg Oral QHS Clapacs, John T, MD   0.5 mg at 03/21/17 2120  . clonazePAM (KLONOPIN) tablet 0.25 mg  0.25 mg Oral TID PRN Clapacs, Madie Reno, MD   0.25 mg at 03/22/17 1644  . conjugated estrogens (PREMARIN) vaginal cream 0.5 Applicatorful  0.5 Applicatorful  Vaginal Q M,W,F Amelia Jo, MD      . docusate sodium (COLACE) capsule 100 mg  100 mg Oral BID Amelia Jo, MD   100 mg at 03/20/17 2231  . doxycycline (VIBRA-TABS) tablet 100 mg  100 mg Oral Q12H Salary, Montell D, MD   100 mg at 03/22/17 1014  . DULoxetine (CYMBALTA) DR capsule 30 mg  30 mg Oral TID Amelia Jo, MD   30 mg at 03/22/17 1650  . estradiol (ESTRACE) tablet 1.5 mg  1.5 mg Oral Daily Amelia Jo, MD   1.5 mg at 03/22/17 1013  . furosemide (LASIX) injection 20 mg  20 mg Intravenous Q12H Salary, Montell D, MD      . heparin injection 5,000 Units  5,000 Units Subcutaneous Q8H Amelia Jo, MD   5,000 Units at 03/22/17 0847  . hydrocortisone sodium succinate (SOLU-CORTEF) 100 MG injection 100 mg  100 mg Intravenous Q6H Salary, Montell D, MD   100 mg at 03/22/17 1256  . ibuprofen (ADVIL,MOTRIN) tablet 400 mg  400 mg Oral Q6H PRN Dorene Sorrow S, NP   400 mg at 03/20/17 1929  . Influenza vac split quadrivalent PF (FLUARIX) injection 0.5 mL  0.5 mL Intramuscular Tomorrow-1000 Salary, Montell D, MD      . insulin aspart (novoLOG) injection 0-5 Units  0-5 Units Subcutaneous QHS Kasa, Kurian, MD      . insulin aspart (novoLOG) injection 0-9 Units  0-9 Units Subcutaneous TID WC Flora Lipps, MD   1 Units at 03/22/17 0819  . ipratropium-albuterol (DUONEB) 0.5-2.5 (3) MG/3ML nebulizer solution 3 mL  3 mL Nebulization QID Salary, Montell D, MD   3 mL at 03/22/17 1506  . ondansetron (ZOFRAN) tablet 4 mg  4 mg Oral Q6H PRN Amelia Jo, MD       Or  . ondansetron [facility]) injection 4 mg  4 mg Intravenous Q6H PRN Amelia Jo, MD   4 mg at 03/22/17 0819  . pantoprazole (PROTONIX) EC tablet 40 mg  40 mg Oral Daily Amelia Jo, MD   40 mg at 03/22/17 1015  . pneumococcal 23 valent vaccine (PNU-IMMUNE) injection 0.5 mL  0.5 mL Intramuscular Tomorrow-1000 Salary, Avel Peace, MD      . Derrill Memo  ON 03/23/2017] potassium chloride (KLOR-CON) packet 40 mEq  40 mEq Oral Daily Salary, Montell D, MD       . potassium phosphate (monobasic) (K-PHOS ORIGINAL) tablet 500 mg  500 mg Oral TID WC Salary, Montell D, MD   500 mg at 03/22/17 1306  . QUEtiapine (SEROQUEL XR) 24 hr tablet 200 mg  200 mg Oral QHS Amelia Jo, MD      . traZODone (DESYREL) tablet 100 mg  100 mg Oral QHS PRN Clapacs, Madie Reno, MD        Musculoskeletal: Strength & Muscle Tone: within normal limits Gait & Station: normal Patient leans: N/A  Psychiatric Specialty Exam: Physical Exam  Nursing note and vitals reviewed. Constitutional: She appears well-developed and well-nourished.  HENT:  Head: Normocephalic and atraumatic.  Eyes: Conjunctivae are normal. Pupils are equal, round, and reactive to light.  Neck: Normal range of motion.  Cardiovascular: Normal heart sounds.  Respiratory: She is in respiratory distress.  GI: Soft.  Musculoskeletal: Normal range of motion.  Neurological: She is alert.  Skin: Skin is warm and dry.  Psychiatric: Judgment normal. Her mood appears anxious. Her speech is delayed. She is slowed. Cognition and memory are normal. She expresses no homicidal and no suicidal ideation.    Review of Systems  Constitutional: Negative.   HENT: Negative.   Eyes: Negative.   Respiratory: Positive for shortness of breath.   Cardiovascular: Negative.   Gastrointestinal: Negative.   Musculoskeletal: Negative.   Skin: Negative.   Neurological: Negative.   Psychiatric/Behavioral: Negative for depression, hallucinations, memory loss, substance abuse and suicidal ideas. The patient is nervous/anxious. The patient does not have insomnia.     Blood pressure 128/61, pulse (!) 101, temperature 97.6 F (36.4 C), temperature source Oral, resp. rate 19, height _0  (1.6 m), weight 78.5 kg (173 lb), SpO2 92 %.Body mass index is 30.65 kg/m.  General Appearance: Casual  Eye Contact:  Fair  Speech:  Clear and Coherent  Volume:  Decreased  Mood:  Anxious  Affect:  Congruent  Thought Process:  Goal Directed   Orientation:  Full (Time, Place, and Person)  Thought Content:  Logical  Suicidal Thoughts:  No  Homicidal Thoughts:  No  Memory:  Immediate;   Fair Recent;   Fair Remote;   Fair  Judgement:  Fair  Insight:  Fair  Psychomotor Activity:  Restlessness  Concentration:  Concentration: Fair  Recall:  [employer] of Knowledge:  Fair  Language:  Fair  Akathisia:  No  Handed:  Right  AIMS (if indicated):     Assets:  Communication Skills Desire for Improvement Housing Resilience  ADL's:  Intact  Cognition:  WNL  Sleep:        Treatment Plan Summary: Daily contact with patient to assess and evaluate symptoms and progress in treatment, Medication management and Plan Patient is having as she says a bad day today.  Very anxious and nervous.  Looks panicky.  I suggested that we add back a clonazepam as needed quarter milligram 3 times a day if needed.  Spoke with nursing about it.  Orders done.  Patient agreeable.  No other change to medicine.  I will continue to follow.  Disposition: No evidence of imminent risk to self or others at present.   Patient does not meet criteria for psychiatric inpatient admission. Supportive therapy provided about ongoing stressors.  Alethia Berthold, MD 03/22/2017 5:11 PM

## 2017-03-22 NOTE — Progress Notes (Signed)
Pharmacy Electrolyte Monitoring Consult:  Pharmacy consulted to assist in monitoring and replacing electrolytes in this 52 y.o. female admitted on 03/18/2017 with Dizziness   Labs:  Sodium (mmol/L)  Date Value  03/22/2017 141  05/15/2014 139   Potassium (mmol/L)  Date Value  03/22/2017 3.3 (L)  05/15/2014 4.0   Magnesium (mg/dL)  Date Value  16/10/960403/04/2017 2.9 (H)   Phosphorus (mg/dL)  Date Value  54/09/811903/06/2017 2.0 (L)   Calcium (mg/dL)  Date Value  14/78/295603/06/2017 7.5 (L)   Calcium, Total (mg/dL)  Date Value  21/30/865704/28/2016 9.3   Albumin (g/dL)  Date Value  84/69/629503/05/2017 2.6 (L)  05/15/2014 3.7    Plan: Will continue with KPhos 500mg  tid, recheck electrolytes with AM labs.   Clovia CuffLisa Lemarcus Baggerly, PharmD, BCPS 03/22/2017 11:45 AM

## 2017-03-22 NOTE — Progress Notes (Signed)
[employer] - [place] [facility]   PATIENT NAME: Brandy PyleKimberly Robinson    MR#:  [id number]  DATE OF BIRTH:  04-19-1965  SUBJECTIVE:  CHIEF COMPLAINT:   Chief Complaint  Patient presents with  . Dizziness  Acute hypoxia overnight, chest x-ray noted for increasing bilateral pulmonary infiltrates, BNP 347, started on IV Lasix, patient with increasing left leg edema, will check lower extremity Dopplers for further evaluation for DVT, check CT of the chest to rule out PE, pulmonology to see  REVIEW OF SYSTEMS:  CONSTITUTIONAL: No fever, fatigue or weakness.  EYES: No blurred or double vision.  EARS, NOSE, AND THROAT: No tinnitus or ear pain.  RESPIRATORY: No cough, shortness of breath, wheezing or hemoptysis.  CARDIOVASCULAR: No chest pain, orthopnea, edema.  GASTROINTESTINAL: No nausea, vomiting, diarrhea or abdominal pain.  GENITOURINARY: No dysuria, hematuria.  ENDOCRINE: No polyuria, nocturia,  HEMATOLOGY: No anemia, easy bruising or bleeding SKIN: No rash or lesion. MUSCULOSKELETAL: No joint pain or arthritis.   NEUROLOGIC: No tingling, numbness, weakness.  PSYCHIATRY: No anxiety or depression.   ROS  DRUG ALLERGIES:   Allergies  Allergen Reactions  . Hydrocodone Itching  . Aspirin Other (See Comments)    Reaction:  GI upset   . Morphine And Related Itching, Nausea And Vomiting and Other (See Comments)    Reaction:  Chest pain    VITALS:  Blood pressure 127/70, pulse 94, temperature 97.6 F (36.4 C), temperature source Oral, resp. rate 19, height 5\' 3"  (1.6 m), weight 78.5 kg (173 lb), SpO2 95 %.  PHYSICAL EXAMINATION:  GENERAL:  52 y.o.-year-old patient lying in the bed with no acute distress.  EYES: Pupils equal, round, reactive to light and accommodation. No scleral icterus. Extraocular muscles intact.  HEENT: Head atraumatic, normocephalic. Oropharynx and nasopharynx clear.  NECK:  Supple, no jugular venous distention. No thyroid enlargement, no  tenderness.  LUNGS: Normal breath sounds bilaterally, no wheezing, rales,rhonchi or crepitation. No use of accessory muscles of respiration.  CARDIOVASCULAR: S1, S2 normal. No murmurs, rubs, or gallops.  ABDOMEN: Soft, nontender, nondistended. Bowel sounds present. No organomegaly or mass.  EXTREMITIES: No pedal edema, cyanosis, or clubbing.  NEUROLOGIC: Cranial nerves II through XII are intact. Muscle strength 5/5 in all extremities. Sensation intact. Gait not checked.  PSYCHIATRIC: The patient is alert and oriented x 3.  SKIN: No obvious rash, lesion, or ulcer.   Physical Exam LABORATORY PANEL:   CBC Recent Labs  Lab 03/22/17 0821  WBC 10.1  HGB 10.9*  HCT 33.0*  PLT 281   ------------------------------------------------------------------------------------------------------------------  Chemistries  Recent Labs  Lab 03/20/17 0546  03/22/17 0504  NA 138   < > 141  K 3.3*   < > 3.3*  CL 107   < > 103  CO2 20*   < > 27  GLUCOSE 217*   < > 130*  BUN 12   < > 10  CREATININE 0.72   < > 0.63  CALCIUM 7.3*   < > 7.5*  MG 2.9*  --   --    < > = values in this interval not displayed.   ------------------------------------------------------------------------------------------------------------------  Cardiac Enzymes No results for input(s): TROPONINI in the last 168 hours. ------------------------------------------------------------------------------------------------------------------  RADIOLOGY:  Dg Chest 2 View  Result Date: 03/21/2017 CLINICAL DATA:  Pneumonia, COPD, smoker EXAM: CHEST  2 VIEW COMPARISON:  11/26/2016; correlation CT chest 03/18/2017 FINDINGS: Stable heart size and mediastinal contours. Severe diffuse airspace infiltrates bilaterally significantly increased since previous exam compatible  with multifocal pneumonia. Air bronchograms at RIGHT lung base. No definite pleural effusion or pneumothorax. Bones unremarkable. IMPRESSION: Significant progression of  BILATERAL pulmonary infiltrates since 03/18/2017. Electronically Signed   By: Ulyses Southward M.D.   On: 03/21/2017 13:37    ASSESSMENT AND PLAN:  1.Acute hypoxemic respiratory failure Worsening noted overnight-started on IV Lasix Secondary to multifactorial process which includes acute pneumonia, COPD exacerbation, cannot exclude possible CHF, pulmonary embolism chest x-ray noted for worsening bilateral infiltrates Did require short stay in the ICU,  continue IV Lasix twice daily, check echocardiogram, check stat CT of the chest to rule out possible PE, pulmonology to see, wean O2 as tolerated  2.Acute sepsis Resolved Secondary toCAP Continue pneumonia protocol, empiric doxycycline/Rocephin, and follow-up on outstanding cultures  3.Acute CAP Resolving   Continue pneumonia protocol, antibiotics per above  4.Acute on COPD exacerbation  Continue IV Solu-Medrol with tapering as tolerated, empiric doxycycline/Rocephin, inhaled corticosteroids twice daily, breathing treatments, RT following, and all other plans as stated above   5.Acute hypokalemia/hypomagnesemia Repleted  6.Chronic bipolar illness  Stable  Psychiatry did see patient while in house-their input is greatly appreciated Continue current regiment   7.  Chronic anxiety/history of panic attacks Stable  Continue current regiment   All the records are reviewed and case discussed with Care Management/Social Workerr. Management plans discussed with the patient, family and they are in agreement.  CODE STATUS: full  TOTAL TIME TAKING CARE OF THIS PATIENT: 35 minutes.     POSSIBLE D/C IN 1-3 DAYS, DEPENDING ON CLINICAL CONDITION.   Evelena Asa Jakari Sada M.D on 03/22/2017   Between 7am to 6pm - Pager - [phone]  After 6pm go to [email or web address] - Social research officer, government  [facility]  Office  [phone]  CC: Primary care physician; Center, [facility]  Note: This dictation  was prepared with Dragon dictation along with smaller phrase technology. Any transcriptional errors that result from this process are unintentional.

## 2017-03-22 NOTE — Progress Notes (Signed)
Notified Dr. Caryn BeeMaier of patient O2 SAT 79 on 3 liters and swollen of left leg. O2 high flow, ABG gas and laxis were ordered. Will continue to monitor.

## 2017-03-22 NOTE — Progress Notes (Signed)
*  PRELIMINARY RESULTS* Echocardiogram 2D Echocardiogram has been performed.  Cristela BlueHege, Bronco Mcgrory 03/22/2017, 11:58 AM

## 2017-03-23 DIAGNOSIS — F411 Generalized anxiety disorder: Secondary | ICD-10-CM

## 2017-03-23 LAB — BASIC METABOLIC PANEL
Anion gap: 10 (ref 5–15)
BUN: 12 mg/dL (ref 6–20)
CALCIUM: 7.5 mg/dL — AB (ref 8.9–10.3)
CO2: 33 mmol/L — AB (ref 22–32)
Chloride: 95 mmol/L — ABNORMAL LOW (ref 101–111)
Creatinine, Ser: 0.68 mg/dL (ref 0.44–1.00)
GFR calc Af Amer: >60 mL/min (ref >60)
GLUCOSE: 130 mg/dL — AB (ref 65–99)
Potassium: 3.3 mmol/L — ABNORMAL LOW (ref 3.5–5.1)
Sodium: 138 mmol/L (ref 135–145)

## 2017-03-23 LAB — GLUCOSE, CAPILLARY
GLUCOSE-CAPILLARY: 163 mg/dL — AB (ref 65–99)
Glucose-Capillary: 116 mg/dL — ABNORMAL HIGH (ref 65–99)
Glucose-Capillary: 139 mg/dL — ABNORMAL HIGH (ref 65–99)
Glucose-Capillary: 179 mg/dL — ABNORMAL HIGH (ref 65–99)

## 2017-03-23 LAB — ECHOCARDIOGRAM COMPLETE
HEIGHTINCHES: 63 in
Weight: 2768 oz

## 2017-03-23 LAB — PHOSPHORUS: Phosphorus: 2.5 mg/dL (ref 2.5–4.6)

## 2017-03-23 LAB — ANGIOTENSIN CONVERTING ENZYME: ANGIOTENSIN-CONVERTING ENZYME: 44 U/L (ref 14–82)

## 2017-03-23 MED ORDER — LISINOPRIL 2.5 MG PO TABS
2.5000 mg | ORAL_TABLET | Freq: Every day | ORAL | Status: DC
  Administered 2017-03-23 – 2017-03-25 (×3): 2.5 mg via ORAL
  Filled 2017-03-23 (×3): qty 1

## 2017-03-23 MED ORDER — MAGIC MOUTHWASH
5.0000 mL | Freq: Two times a day (BID) | ORAL | Status: DC | PRN
  Administered 2017-03-23: 5 mL via ORAL
  Filled 2017-03-23 (×2): qty 5

## 2017-03-23 MED ORDER — CARVEDILOL 12.5 MG PO TABS
12.5000 mg | ORAL_TABLET | Freq: Two times a day (BID) | ORAL | Status: DC
  Administered 2017-03-23 – 2017-03-25 (×4): 12.5 mg via ORAL
  Filled 2017-03-23 (×4): qty 1

## 2017-03-23 MED ORDER — METHYLPREDNISOLONE SODIUM SUCC 125 MG IJ SOLR
60.0000 mg | Freq: Four times a day (QID) | INTRAMUSCULAR | Status: DC
  Administered 2017-03-23 – 2017-03-25 (×9): 60 mg via INTRAVENOUS
  Filled 2017-03-23 (×9): qty 2

## 2017-03-23 NOTE — Care Management (Signed)
RNCM confirmed that patient is active with [facility].  Last seen October 2018.  PCP Adamo, updated in Epic.  Application to Medication Management , [facility], "[facility]:  [facility] Guide [facility] [place]"  Booklet placed on Chart.  RNCM to review with patient prior to discharge.

## 2017-03-23 NOTE — Progress Notes (Signed)
[facility]     Assessment and Plan:  Acute severe hypoxic respiratory failure, currently on high flow nasal cannula @60 %. Pulmonary edema and pneumonitis changes, suspect secondary to heart failure.. Small right pleural effusion. Possible pneumonia, suspect viral.  -Continue diuresis for pulmonary edema. -Wean down high flow oxygen as tolerated --Wean down steroids as tolerated.  -Empiric antibiotics.  Date: 03/23/2017  MRN# [id number] Brandy Robinson [birth year]   Brandy Robinson is a 52 y.o. old female seen in follow up for chief complaint of  Chief Complaint  Patient presents with  . Dizziness     HPI:  Pt awake and alert, no new complaints, feeling well.   Doxycycline 3/5>>  Medication:    Current Facility-Administered Medications:  .  acetaminophen (TYLENOL) tablet 650 mg, 650 mg, Oral, Q6H PRN, 650 mg at 03/22/17 0029 **OR** acetaminophen (TYLENOL) suppository 650 mg, 650 mg, Rectal, Q6H PRN, Cammy Copa, MD .  bisacodyl (DULCOLAX) EC tablet 5 mg, 5 mg, Oral, Daily PRN, Cammy Copa, MD .  budesonide (PULMICORT) nebulizer solution 0.5 mg, 0.5 mg, Nebulization, BID, Belia Heman, Kurian, MD, 0.5 mg at 03/23/17 0805 .  buPROPion (WELLBUTRIN SR) 12 hr tablet 150 mg, 150 mg, Oral, Daily, Cammy Copa, MD, 150 mg at 03/22/17 1015 .  busPIRone (BUSPAR) tablet 20 mg, 20 mg, Oral, BID, Clapacs, Jackquline Denmark, MD, 20 mg at 03/22/17 2129 .  carvedilol (COREG) tablet 12.5 mg, 12.5 mg, Oral, BID WC, Salary, Montell D, MD .  cefTRIAXone (ROCEPHIN) 1 g in sodium chloride 0.9 % 100 mL IVPB, 1 g, Intravenous, Q24H, Cammy Copa, MD, Stopped at 03/22/17 1101 .  clonazePAM (KLONOPIN) disintegrating tablet 0.5 mg, 0.5 mg, Oral, QHS, Clapacs, John T, MD, 0.5 mg at 03/22/17 2130 .  clonazePAM (KLONOPIN) tablet 0.25 mg, 0.25 mg, Oral, TID PRN, Clapacs, John T, MD, 0.25 mg at 03/22/17 1644 .  conjugated estrogens (PREMARIN) vaginal cream 0.5 Applicatorful, 0.5 Applicatorful,  Vaginal, Q M,W,F, Cammy Copa, MD, 0.5 Applicatorful at 03/22/17 1824 .  docusate sodium (COLACE) capsule 100 mg, 100 mg, Oral, BID, Cammy Copa, MD, 100 mg at 03/20/17 2231 .  doxycycline (VIBRA-TABS) tablet 100 mg, 100 mg, Oral, Q12H, Salary, Montell D, MD, 100 mg at 03/23/17 1023 .  DULoxetine (CYMBALTA) DR capsule 30 mg, 30 mg, Oral, TID, Cammy Copa, MD, 30 mg at 03/23/17 1023 .  estradiol (ESTRACE) tablet 1.5 mg, 1.5 mg, Oral, Daily, Cammy Copa, MD, 1.5 mg at 03/23/17 1022 .  furosemide (LASIX) injection 20 mg, 20 mg, Intravenous, Q12H, Salary, Montell D, MD, 20 mg at 03/23/17 0843 .  heparin injection 5,000 Units, 5,000 Units, Subcutaneous, Q8H, Cammy Copa, MD, 5,000 Units at 03/23/17 [phone] .  ibuprofen (ADVIL,MOTRIN) tablet 400 mg, 400 mg, Oral, Q6H PRN, Luci Bank, Magadalene S, NP, 400 mg at 03/20/17 1929 .  Influenza vac split quadrivalent PF (FLUARIX) injection 0.5 mL, 0.5 mL, Intramuscular, Tomorrow-1000, Salary, Montell D, MD .  insulin aspart (novoLOG) injection 0-5 Units, 0-5 Units, Subcutaneous, QHS, Kasa, Kurian, MD .  insulin aspart (novoLOG) injection 0-9 Units, 0-9 Units, Subcutaneous, TID WC, Erin Fulling, MD, 2 Units at 03/23/17 [phone] .  ipratropium-albuterol (DUONEB) 0.5-2.5 (3) MG/3ML nebulizer solution 3 mL, 3 mL, Nebulization, QID, Salary, Montell D, MD, 3 mL at 03/23/17 0805 .  methylPREDNISolone sodium succinate (SOLU-MEDROL) 125 mg/2 mL injection 60 mg, 60 mg, Intravenous, Q6H, Salary, Montell D, MD, 60 mg at 03/23/17 1021 .  ondansetron (ZOFRAN) tablet 4 mg, 4 mg, Oral, Q6H PRN **  OR** ondansetron (ZOFRAN) injection 4 mg, 4 mg, Intravenous, Q6H PRN, Cammy Copa, MD, 4 mg at 03/22/17 2220 .  pantoprazole (PROTONIX) EC tablet 40 mg, 40 mg, Oral, Daily, Cammy Copa, MD, 40 mg at 03/23/17 1023 .  pneumococcal 23 valent vaccine (PNU-IMMUNE) injection 0.5 mL, 0.5 mL, Intramuscular, Tomorrow-1000, Salary, Montell D, MD .  potassium chloride (KLOR-CON) packet 40 mEq, 40  mEq, Oral, Daily, Salary, Montell D, MD, 40 mEq at 03/23/17 1022 .  QUEtiapine (SEROQUEL XR) 24 hr tablet 200 mg, 200 mg, Oral, QHS, Cammy Copa, MD, 200 mg at 03/22/17 2131 .  traZODone (DESYREL) tablet 100 mg, 100 mg, Oral, QHS PRN, Clapacs, John T, MD   Allergies:  Hydrocodone; Aspirin; and Morphine and related  Review of Systems: Gen:  Denies  fever, sweats. HEENT: Denies blurred vision. Cvc:  No dizziness, chest pain or heaviness Resp:   Denies cough or sputum porduction. Gi: Denies swallowing difficulty, stomach pain. constipation, bowel incontinence Gu:  Denies bladder incontinence, burning urine Ext:   No Joint pain, stiffness. Skin: No skin rash, easy bruising. Endoc:  No polyuria, polydipsia. Psych: No depression, insomnia. Other:  All other systems were reviewed and found to be negative other than what is mentioned in the HPI.   Physical Examination:   VS: BP 128/66   Pulse (!) 101   Temp 98.1 F (36.7 C) (Oral)   Resp (!) 24   Ht 5\' 3"  (1.6 m)   Wt 177 lb 14.4 oz (80.7 kg)   SpO2 96%   BMI 31.51 kg/m    General Appearance: No distress  Neuro:without focal findings,  speech normal,  HEENT: PERRLA, EOM intact. Pulmonary: normal breath sounds, No wheezing.   CardiovascularNormal S1,S2.  No m/r/g.   Abdomen: Benign, Soft, non-tender. Renal:  No costovertebral tenderness  GU:  Not performed at this time. Endoc: No evident thyromegaly, no signs of acromegaly. Skin:   warm, no rash. Extremities: normal, no cyanosis, clubbing.   LABORATORY PANEL:   CBC Recent Labs  Lab 03/22/17 0821  WBC 10.1  HGB 10.9*  HCT 33.0*  PLT 281   ------------------------------------------------------------------------------------------------------------------  Chemistries  Recent Labs  Lab 03/20/17 0546  03/23/17 0408  NA 138   < > 138  K 3.3*   < > 3.3*  CL 107   < > 95*  CO2 20*   < > 33*  GLUCOSE 217*   < > 130*  BUN 12   < > 12  CREATININE 0.72   < > 0.68    CALCIUM 7.3*   < > 7.5*  MG 2.9*  --   --    < > = values in this interval not displayed.   ------------------------------------------------------------------------------------------------------------------  Cardiac Enzymes No results for input(s): TROPONINI in the last 168 hours. ------------------------------------------------------------  RADIOLOGY:   No results found for this or any previous visit. Results for orders placed during the hospital encounter of 03/18/17  DG Chest 2 View   Narrative CLINICAL DATA:  Pneumonia, COPD, smoker  EXAM: CHEST  2 VIEW  COMPARISON:  11/26/2016; correlation CT chest 03/18/2017  FINDINGS: Stable heart size and mediastinal contours.  Severe diffuse airspace infiltrates bilaterally significantly increased since previous exam compatible with multifocal pneumonia.  Air bronchograms at RIGHT lung base.  No definite pleural effusion or pneumothorax.  Bones unremarkable.  IMPRESSION: Significant progression of BILATERAL pulmonary infiltrates since 03/18/2017.   Electronically Signed   By: Ulyses Southward M.D.   On: 03/21/2017 13:37    ------------------------------------------------------------------------------------------------------------------  Thank  you for allowing [facility] to assist in the care of your patient. Our recommendations are noted above.  Please contact us if we can be of further service.   Wells Guileseep Porscha Axley, MD.  [facility] Office Number: [phone]  Santiago Gladavid Kasa, M.D.  Billy Fischeravid Simonds, M.D  03/23/2017

## 2017-03-23 NOTE — Consult Note (Signed)
[facility] Consult   Reason for Consult: Follow-up consult 52 year old woman with a history of chronic anxiety Referring Physician: Salary Patient Identification: Brandy Robinson MRN:  [id number] Principal Diagnosis: Generalized anxiety disorder Diagnosis:   Patient Active Problem List   Diagnosis Date Noted  . Generalized anxiety disorder [F41.1] 03/21/2017  . CAP (community acquired pneumonia) [J18.9] 03/18/2017  . Acute respiratory failure ([place]) [J96.00]   . Perforated viscus [R19.8] 12/30/2014  . Lichen sclerosus [D98.3] 09/24/2014  . Surgical menopause [E89.40] 09/24/2014  . Endometriosis [N80.9] 09/24/2014  . Bipolar 1 disorder ([place]) [F31.9] 09/24/2014  . Insomnia [G47.00] 09/24/2014  . GERD (gastroesophageal reflux disease) [K21.9] 09/24/2014  . History of stomach ulcers [Z87.19] 09/24/2014    Total Time spent with patient: 15 minutes  Subjective:   Brandy Robinson is a 52 y.o. female patient admitted with "I am doing better".  HPI: See previous notes.  52 year old woman in the hospital with respiratory distress.  Slowly improving.  History of chronic anxiety.  Having some occasional panic attacks from her difficulty breathing.  Says that she appreciated having the as needed benzodiazepine last night.  No sign that she suffered from it.  Alert and oriented today.  Shows evidence of understanding her condition and is working on appropriate treatment  Past Psychiatric History: Chronic anxiety  Risk to Self: Is patient at risk for suicide?: No Risk to Others:   Prior Inpatient Therapy:   Prior Outpatient Therapy:    Past Medical History:  Past Medical History:  Diagnosis Date  . Anxiety   . Bipolar disorder ([place])   . Depression   . Disc disorder   . Dyspareunia   . Endometriosis   . GERD (gastroesophageal reflux disease)   . Insomnia   . Lichen   . Surgical menopause   . Vaginal atrophy   . Wears glasses     Past Surgical History:  Procedure  Laterality Date  . ABDOMINAL HYSTERECTOMY  1995   tah-bso  . ARM WOUND REPAIR / CLOSURE  2013   cellulitis rt arm-i/d  . KNEE BURSECTOMY Left 09/24/2013   Procedure: IRRIGATION AND DEBRIDEMENT OF LEFT KNEE ;  Surgeon: Renette Butters, MD;  Location: [facility];  Service: Orthopedics;  Laterality: Left;  . LAPAROTOMY N/A 12/30/2014   Procedure: EXPLORATORY LAPAROTOMY-graham patch of peptic ulcer;  Surgeon: Clayburn Pert, MD;  Location: [facility];  Service: General;  Laterality: N/A;   Family History:  Family History  Problem Relation Age of Onset  . Diabetes Mother   . Diabetes Sister   . Cancer Father        Throat  . Heart disease Neg Hx    Family Psychiatric  History: See previous notes Social History:  Social History   Substance and Sexual Activity  Alcohol Use No     Social History   Substance and Sexual Activity  Drug Use No    Social History   Socioeconomic History  . Marital status: Married    Spouse name: None  . Number of children: None  . Years of education: None  . Highest education level: None  Social Needs  . Financial resource strain: None  . Food insecurity - worry: None  . Food insecurity - inability: None  . Transportation needs - medical: None  . Transportation needs - non-medical: None  Occupational History  . None  Tobacco Use  . Smoking status: Current Every Day Smoker    Packs/day: 1.00  Types: Cigarettes  . Smokeless tobacco: Never Used  Substance and Sexual Activity  . Alcohol use: No  . Drug use: No  . Sexual activity: Yes    Birth control/protection: Surgical  Other Topics Concern  . None  Social History Narrative  . None   Additional Social History:    Allergies:   Allergies  Allergen Reactions  . Hydrocodone Itching  . Aspirin Other (See Comments)    Reaction:  GI upset   . Morphine And Related Itching, Nausea And Vomiting and Other (See Comments)    Reaction:  Chest pain    Labs:  Results for  orders placed or performed during the hospital encounter of 03/18/17 (from the past 48 hour(s))  Glucose, capillary     Status: Abnormal   Collection Time: 03/21/17  9:14 PM  Result Value Ref Range   Glucose-Capillary 134 (H) 65 - 99 mg/dL   Comment 1 Notify RN   Blood gas, arterial     Status: Abnormal   Collection Time: 03/21/17 11:00 PM  Result Value Ref Range   FIO2 0.55    pH, Arterial 7.47 (H) 7.350 - 7.450   pCO2 arterial 37 32.0 - 48.0 mmHg   pO2, Arterial 50 (L) 83.0 - 108.0 mmHg   Bicarbonate 26.9 20.0 - 28.0 mmol/L   Acid-Base Excess 3.2 (H) 0.0 - 2.0 mmol/L   O2 Saturation 87.5 %   Patient temperature 37.0    Collection site RIGHT RADIAL    Sample type ARTERIAL DRAW    Allens test (pass/fail) PASS PASS    Comment: Performed at [facility], [address] [place], [place] [address]  Basic metabolic panel     Status: Abnormal   Collection Time: 03/22/17  5:04 AM  Result Value Ref Range   Sodium 141 135 - 145 mmol/L   Potassium 3.3 (L) 3.5 - 5.1 mmol/L   Chloride 103 101 - 111 mmol/L   CO2 27 22 - 32 mmol/L   Glucose, Bld 130 (H) 65 - 99 mg/dL   BUN 10 6 - 20 mg/dL   Creatinine, Ser 0.63 0.44 - 1.00 mg/dL   Calcium 7.5 (L) 8.9 - 10.3 mg/dL   GFR calc non Af Amer >60 >60 mL/min   GFR calc Af Amer >60 >60 mL/min    Comment: (NOTE) The eGFR has been calculated using the CKD EPI equation. This calculation has not been validated in all clinical situations. eGFR's persistently <60 mL/min signify possible Chronic Kidney Disease.    Anion gap 11 5 - 15    Comment: Performed at [facility], [address] [place], [place] [address]  Phosphorus     Status: Abnormal   Collection Time: 03/22/17  5:04 AM  Result Value Ref Range   Phosphorus 2.0 (L) 2.5 - 4.6 mg/dL    Comment: Performed at [facility], [address] [place], [place] [address]  Glucose, capillary     Status: Abnormal   Collection Time: 03/22/17  7:41 AM  Result Value Ref  Range   Glucose-Capillary 130 (H) 65 - 99 mg/dL   Comment 1 Notify RN   Procalcitonin - Baseline     Status: None   Collection Time: 03/22/17  8:21 AM  Result Value Ref Range   Procalcitonin 1.64 ng/mL    Comment:        Interpretation: PCT > 0.5 ng/mL and <= 2 ng/mL: Systemic infection (sepsis) is possible, but other conditions are known to elevate PCT  as well. (NOTE)       Sepsis PCT Algorithm           Lower Respiratory Tract                                      Infection PCT Algorithm    ----------------------------     ----------------------------         PCT < 0.25 ng/mL                PCT < 0.10 ng/mL         Strongly encourage             Strongly discourage   discontinuation of antibiotics    initiation of antibiotics    ----------------------------     -----------------------------       PCT 0.25 - 0.50 ng/mL            PCT 0.10 - 0.25 ng/mL               OR       >80% decrease in PCT            Discourage initiation of                                            antibiotics      Encourage discontinuation           of antibiotics    ----------------------------     -----------------------------         PCT >= 0.50 ng/mL              PCT 0.26 - 0.50 ng/mL                AND       <80% decrease in PCT             Encourage initiation of                                             antibiotics       Encourage continuation           of antibiotics    ----------------------------     -----------------------------        PCT >= 0.50 ng/mL                  PCT > 0.50 ng/mL               AND         increase in PCT                  Strongly encourage                                      initiation of antibiotics    Strongly encourage escalation           of antibiotics                                     -----------------------------  PCT <= 0.25 ng/mL                                                 OR                                         > 80% decrease in PCT                                     Discontinue / Do not initiate                                             antibiotics Performed at [facility], [address] [place], [place] [address]   CBC with Differential/Platelet     Status: Abnormal   Collection Time: 03/22/17  8:21 AM  Result Value Ref Range   WBC 10.1 3.6 - 11.0 K/uL   RBC 3.64 (L) 3.80 - 5.20 MIL/uL   Hemoglobin 10.9 (L) 12.0 - 16.0 g/dL   HCT 33.0 (L) 35.0 - 47.0 %   MCV 90.6 80.0 - 100.0 fL   MCH 30.0 26.0 - 34.0 pg   MCHC 33.1 32.0 - 36.0 g/dL   RDW 15.0 (H) 11.5 - 14.5 %   Platelets 281 150 - 440 K/uL   Neutrophils Relative % 77 %   Neutro Abs 7.8 (H) 1.4 - 6.5 K/uL   Lymphocytes Relative 16 %   Lymphs Abs 1.6 1.0 - 3.6 K/uL   Monocytes Relative 7 %   Monocytes Absolute 0.7 0.2 - 0.9 K/uL   Eosinophils Relative 0 %   Eosinophils Absolute 0.0 0 - 0.7 K/uL   Basophils Relative 0 %   Basophils Absolute 0.0 0 - 0.1 K/uL    Comment: Performed at [facility], [address], [place], [place] [address]  Angiotensin converting enzyme     Status: None   Collection Time: 03/22/17  8:22 AM  Result Value Ref Range   Angiotensin-Converting Enzyme 44 14 - 82 U/L    Comment: (NOTE) Performed At: [facility] [place], [place] [id number] Rush Farmer MD [phone] Performed at [facility], [address] [place], [place] [address]   Glucose, capillary     Status: Abnormal   Collection Time: 03/22/17 12:00 PM  Result Value Ref Range   Glucose-Capillary 118 (H) 65 - 99 mg/dL  Glucose, capillary     Status: Abnormal   Collection Time: 03/22/17  4:49 PM  Result Value Ref Range   Glucose-Capillary 146 (H) 65 - 99 mg/dL  Glucose, capillary     Status: Abnormal   Collection Time: 03/22/17  9:40 PM  Result Value Ref Range   Glucose-Capillary 127 (H) 65 - 99 mg/dL  Basic metabolic panel     Status: Abnormal   Collection Time:  03/23/17  4:08 AM  Result Value Ref Range   Sodium 138 135 - 145 mmol/L   Potassium 3.3 (L) 3.5 - 5.1 mmol/L   Chloride 95 (L) 101 - 111 mmol/L   CO2 33 (  H) 22 - 32 mmol/L   Glucose, Bld 130 (H) 65 - 99 mg/dL   BUN 12 6 - 20 mg/dL   Creatinine, Ser 0.68 0.44 - 1.00 mg/dL   Calcium 7.5 (L) 8.9 - 10.3 mg/dL   GFR calc non Af Amer >60 >60 mL/min   GFR calc Af Amer >60 >60 mL/min    Comment: (NOTE) The eGFR has been calculated using the CKD EPI equation. This calculation has not been validated in all clinical situations. eGFR's persistently <60 mL/min signify possible Chronic Kidney Disease.    Anion gap 10 5 - 15    Comment: Performed at [facility], [address] [place], [place] [address]  Phosphorus     Status: None   Collection Time: 03/23/17  4:08 AM  Result Value Ref Range   Phosphorus 2.5 2.5 - 4.6 mg/dL    Comment: Performed at [facility], [address] [place], [place] [address]  Glucose, capillary     Status: Abnormal   Collection Time: 03/23/17  7:39 AM  Result Value Ref Range   Glucose-Capillary 163 (H) 65 - 99 mg/dL   Comment 1 Notify RN   Glucose, capillary     Status: Abnormal   Collection Time: 03/23/17 11:28 AM  Result Value Ref Range   Glucose-Capillary 116 (H) 65 - 99 mg/dL   Comment 1 Notify RN   Glucose, capillary     Status: Abnormal   Collection Time: 03/23/17  5:13 PM  Result Value Ref Range   Glucose-Capillary 139 (H) 65 - 99 mg/dL   Comment 1 Notify RN     Current Facility-Administered Medications  Medication Dose Route Frequency Provider Last Rate Last Dose  . acetaminophen (TYLENOL) tablet 650 mg  650 mg Oral Q6H PRN Amelia Jo, MD   650 mg at 03/22/17 0029   Or  . acetaminophen (TYLENOL) suppository 650 mg  650 mg Rectal Q6H PRN Amelia Jo, MD      . bisacodyl (DULCOLAX) EC tablet 5 mg  5 mg Oral Daily PRN Amelia Jo, MD      . budesonide (PULMICORT) nebulizer solution 0.5 mg  0.5 mg Nebulization BID Flora Lipps, MD   0.5 mg at 03/23/17 1943  . buPROPion (WELLBUTRIN SR) 12 hr tablet 150 mg  150 mg Oral Daily Amelia Jo, MD   150 mg at 03/23/17 1025  . busPIRone (BUSPAR) tablet 20 mg  20 mg Oral BID Bobbiejo Ishikawa, Madie Reno, MD   20 mg at 03/23/17 1030  . carvedilol (COREG) tablet 12.5 mg  12.5 mg Oral BID WC Salary, Montell D, MD   12.5 mg at 03/23/17 1902  . cefTRIAXone (ROCEPHIN) 1 g in sodium chloride 0.9 % 100 mL IVPB  1 g Intravenous Q24H Amelia Jo, MD   Stopped at 03/23/17 1059  . clonazePAM (KLONOPIN) disintegrating tablet 0.5 mg  0.5 mg Oral QHS Tequila Rottmann T, MD   0.5 mg at 03/22/17 2130  . clonazePAM (KLONOPIN) tablet 0.25 mg  0.25 mg Oral TID PRN Devin Foskey, Madie Reno, MD   0.25 mg at 03/23/17 1259  . conjugated estrogens (PREMARIN) vaginal cream 0.5 Applicatorful  0.5 Applicatorful Vaginal Q M,W,F Amelia Jo, MD   0.5 Applicatorful at 67/20/94 1824  . docusate sodium (COLACE) capsule 100 mg  100 mg Oral BID Amelia Jo, MD   100 mg at 03/20/17 2231  . doxycycline (VIBRA-TABS) tablet 100 mg  100 mg Oral Q12H Salary, Montell D, MD   100 mg  at 03/23/17 1023  . DULoxetine (CYMBALTA) DR capsule 30 mg  30 mg Oral TID Amelia Jo, MD   30 mg at 03/23/17 1447  . estradiol (ESTRACE) tablet 1.5 mg  1.5 mg Oral Daily Amelia Jo, MD   1.5 mg at 03/23/17 1022  . furosemide (LASIX) injection 20 mg  20 mg Intravenous Q12H Salary, Holly Bodily D, MD   20 mg at 03/23/17 0843  . heparin injection 5,000 Units  5,000 Units Subcutaneous Q8H Amelia Jo, MD   5,000 Units at 03/23/17 1805  . ibuprofen (ADVIL,MOTRIN) tablet 400 mg  400 mg Oral Q6H PRN Dorene Sorrow S, NP   400 mg at 03/20/17 1929  . Influenza vac split quadrivalent PF (FLUARIX) injection 0.5 mL  0.5 mL Intramuscular Tomorrow-1000 Salary, Montell D, MD      . insulin aspart (novoLOG) injection 0-5 Units  0-5 Units Subcutaneous QHS Kasa, Kurian, MD      . insulin aspart (novoLOG) injection 0-9 Units  0-9 Units Subcutaneous TID WC Flora Lipps, MD   1 Units at 03/23/17 1805  . ipratropium-albuterol (DUONEB) 0.5-2.5 (3) MG/3ML nebulizer solution 3 mL  3 mL Nebulization QID Salary, Montell D, MD   3 mL at 03/23/17 1943  . lisinopril (PRINIVIL,ZESTRIL) tablet 2.5 mg  2.5 mg Oral Daily Salary, Montell D, MD   2.5 mg at 03/23/17 1447  . magic mouthwash  5 mL Oral BID PRN Loney Hering D, MD   5 mL at 03/23/17 1902  . methylPREDNISolone sodium succinate (SOLU-MEDROL) 125 mg/2 mL injection 60 mg  60 mg Intravenous Q6H Salary, Montell D, MD   60 mg at 03/23/17 1447  . ondansetron (ZOFRAN) tablet 4 mg  4 mg Oral Q6H PRN Amelia Jo, MD   4 mg at 03/23/17 1259   Or  . ondansetron (ZOFRAN) injection 4 mg  4 mg Intravenous Q6H PRN Amelia Jo, MD   4 mg at 03/23/17 1805  . pantoprazole (PROTONIX) EC tablet 40 mg  40 mg Oral Daily Amelia Jo, MD   40 mg at 03/23/17 1023  . pneumococcal 23 valent vaccine (PNU-IMMUNE) injection 0.5 mL  0.5 mL Intramuscular Tomorrow-1000 Salary, Montell D, MD      . potassium chloride (KLOR-CON) packet 40 mEq  40 mEq Oral Daily Salary, Montell D, MD   40 mEq at 03/23/17 1022  . QUEtiapine (SEROQUEL XR) 24 hr tablet 200 mg  200 mg Oral QHS Amelia Jo, MD   200 mg at 03/22/17 2131  . traZODone (DESYREL) tablet 100 mg  100 mg Oral QHS PRN Rabab Currington, Madie Reno, MD        Musculoskeletal: Strength & Muscle Tone: within normal limits Gait & Station: normal Patient leans: N/A  Psychiatric Specialty Exam: Physical Exam  Nursing note and vitals reviewed. Constitutional: She appears well-developed and well-nourished.  HENT:  Head: Normocephalic and atraumatic.  Eyes: Conjunctivae are normal. Pupils are equal, round, and reactive to light.  Neck: Normal range of motion.  Cardiovascular: Regular rhythm and normal heart sounds.  Respiratory: She is in respiratory distress.  GI: Soft.  Musculoskeletal: Normal range of motion.  Neurological: She is alert.  Skin: Skin is warm and dry.  Psychiatric: Her  speech is normal and behavior is normal. Judgment and thought content normal. Her mood appears anxious. Cognition and memory are normal.    Review of Systems  Constitutional: Negative.   HENT: Negative.   Eyes: Negative.   Respiratory: Positive for shortness of breath.   Cardiovascular:  Negative.   Gastrointestinal: Negative.   Musculoskeletal: Negative.   Skin: Negative.   Neurological: Negative.   Psychiatric/Behavioral: Negative.     Blood pressure (!) 141/69, pulse 95, temperature 97.8 F (36.6 C), temperature source Oral, resp. rate (!) 24, height _0  (1.6 m), weight 80.7 kg (177 lb 14.4 oz), SpO2 94 %.Body mass index is 31.51 kg/m.  General Appearance: Casual  Eye Contact:  Good  Speech:  Clear and Coherent  Volume:  Normal  Mood:  Anxious  Affect:  Congruent  Thought Process:  Goal Directed  Orientation:  Full (Time, Place, and Person)  Thought Content:  Logical  Suicidal Thoughts:  No  Homicidal Thoughts:  No  Memory:  Immediate;   Fair Recent;   Fair Remote;   Fair  Judgement:  Fair  Insight:  Fair  Psychomotor Activity:  Decreased  Concentration:  Concentration: Fair  Recall:  [employer] of Knowledge:  Fair  Language:  Fair  Akathisia:  Negative  Handed:  Right  AIMS (if indicated):     Assets:  Desire for Improvement Financial Resources/Insurance  ADL's:  Intact  Cognition:  WNL  Sleep:        Treatment Plan Summary: Plan No change to medication.  Supportive therapy.  Reviewed medication usage.  Should not need to have any increased or change in medicine at discharge.  We will follow up.  Disposition: No evidence of imminent risk to self or others at present.   Patient does not meet criteria for psychiatric inpatient admission. Supportive therapy provided about ongoing stressors.  Alethia Berthold, MD 03/23/2017 8:08 PM

## 2017-03-23 NOTE — Progress Notes (Signed)
Pharmacy Electrolyte Monitoring Consult:  Pharmacy consulted to assist in monitoring and replacing electrolytes in this 52 y.o. female admitted on 03/18/2017 with Dizziness   Labs:  Sodium (mmol/L)  Date Value  03/23/2017 138  05/15/2014 139   Potassium (mmol/L)  Date Value  03/23/2017 3.3 (L)  05/15/2014 4.0   Magnesium (mg/dL)  Date Value  40/98/119103/04/2017 2.9 (H)   Phosphorus (mg/dL)  Date Value  47/82/956203/07/2017 2.5   Calcium (mg/dL)  Date Value  13/08/657803/07/2017 7.5 (L)   Calcium, Total (mg/dL)  Date Value  46/96/295204/28/2016 9.3   Albumin (g/dL)  Date Value  84/13/244003/05/2017 2.6 (L)  05/15/2014 3.7    Plan: Potassium is still a little low but phosphorus is now within range, continue with KCl 40mEq PO daily. Follow up with AM labs.  Clovia CuffLisa Renee Beale, PharmD, BCPS 03/23/2017 2:03 PM

## 2017-03-23 NOTE — Care Management (Signed)
Patient still requiring High Flow [place]

## 2017-03-23 NOTE — Progress Notes (Signed)
[facility] [place] [facility]   PATIENT NAME: Brandy Robinson    MR#:  [id number]  DATE OF BIRTH:  1965/11/04  SUBJECTIVE:  CHIEF COMPLAINT:   Chief Complaint  Patient presents with  . Dizziness  Patient feels better, less leg swelling, CT chest, lower extremity Dopplers noted  REVIEW OF SYSTEMS:  CONSTITUTIONAL: No fever, fatigue or weakness.  EYES: No blurred or double vision.  EARS, NOSE, AND THROAT: No tinnitus or ear pain.  RESPIRATORY: No cough, shortness of breath, wheezing or hemoptysis.  CARDIOVASCULAR: No chest pain, orthopnea, edema.  GASTROINTESTINAL: No nausea, vomiting, diarrhea or abdominal pain.  GENITOURINARY: No dysuria, hematuria.  ENDOCRINE: No polyuria, nocturia,  HEMATOLOGY: No anemia, easy bruising or bleeding SKIN: No rash or lesion. MUSCULOSKELETAL: No joint pain or arthritis.   NEUROLOGIC: No tingling, numbness, weakness.  PSYCHIATRY: No anxiety or depression.   ROS  DRUG ALLERGIES:   Allergies  Allergen Reactions  . Hydrocodone Itching  . Aspirin Other (See Comments)    Reaction:  GI upset   . Morphine And Related Itching, Nausea And Vomiting and Other (See Comments)    Reaction:  Chest pain    VITALS:  Blood pressure (!) 141/65, pulse 99, temperature 97.8 F (36.6 C), temperature source Oral, resp. rate (!) 24, height 5\' 3"  (1.6 m), weight 80.7 kg (177 lb 14.4 oz), SpO2 95 %.  PHYSICAL EXAMINATION:  GENERAL:  52 y.o.-year-old patient lying in the bed with no acute distress.  EYES: Pupils equal, round, reactive to light and accommodation. No scleral icterus. Extraocular muscles intact.  HEENT: Head atraumatic, normocephalic. Oropharynx and nasopharynx clear.  NECK:  Supple, no jugular venous distention. No thyroid enlargement, no tenderness.  LUNGS: Normal breath sounds bilaterally, no wheezing, rales,rhonchi or crepitation. No use of accessory muscles of respiration.  CARDIOVASCULAR: S1, S2 normal. No murmurs, rubs,  or gallops.  ABDOMEN: Soft, nontender, nondistended. Bowel sounds present. No organomegaly or mass.  EXTREMITIES: No pedal edema, cyanosis, or clubbing.  NEUROLOGIC: Cranial nerves II through XII are intact. Muscle strength 5/5 in all extremities. Sensation intact. Gait not checked.  PSYCHIATRIC: The patient is alert and oriented x 3.  SKIN: No obvious rash, lesion, or ulcer.   Physical Exam LABORATORY PANEL:   CBC Recent Labs  Lab 03/22/17 0821  WBC 10.1  HGB 10.9*  HCT 33.0*  PLT 281   ------------------------------------------------------------------------------------------------------------------  Chemistries  Recent Labs  Lab 03/20/17 0546  03/23/17 0408  NA 138   < > 138  K 3.3*   < > 3.3*  CL 107   < > 95*  CO2 20*   < > 33*  GLUCOSE 217*   < > 130*  BUN 12   < > 12  CREATININE 0.72   < > 0.68  CALCIUM 7.3*   < > 7.5*  MG 2.9*  --   --    < > = values in this interval not displayed.   ------------------------------------------------------------------------------------------------------------------  Cardiac Enzymes No results for input(s): TROPONINI in the last 168 hours. ------------------------------------------------------------------------------------------------------------------  RADIOLOGY:  Dg Chest 2 View  Result Date: 03/21/2017 CLINICAL DATA:  Pneumonia, COPD, smoker EXAM: CHEST  2 VIEW COMPARISON:  11/26/2016; correlation CT chest 03/18/2017 FINDINGS: Stable heart size and mediastinal contours. Severe diffuse airspace infiltrates bilaterally significantly increased since previous exam compatible with multifocal pneumonia. Air bronchograms at RIGHT lung base. No definite pleural effusion or pneumothorax. Bones unremarkable. IMPRESSION: Significant progression of BILATERAL pulmonary infiltrates since 03/18/2017. Electronically Signed  By: Ulyses SouthwardMark  Boles M.D.   On: 03/21/2017 13:37   Ct Angio Chest Pe W Or Wo Contrast  Result Date: 03/22/2017 CLINICAL DATA:   Being treated for pneumonia. Worsening shortness of breath and oxygen saturation. EXAM: CT ANGIOGRAPHY CHEST WITH CONTRAST TECHNIQUE: Multidetector CT imaging of the chest was performed using the standard protocol during bolus administration of intravenous contrast. Multiplanar CT image reconstructions and MIPs were obtained to evaluate the vascular anatomy. CONTRAST:  75mL ISOVUE-370 IOPAMIDOL (ISOVUE-370) INJECTION 76% COMPARISON:  Radiography 03/21/2017.  CT 03/18/2017. FINDINGS: Cardiovascular: Pulmonary arterial opacification is excellent. There are no pulmonary emboli. There is aortic atherosclerosis, premature for age. No aneurysm. No coronary artery calcification is seen. Small amount of pericardial fluid in the superior recess. Mediastinum/Nodes: Calcified node in the subcarinal region. Mild reactive nodal prominence along the right paratracheal region. Lungs/Pleura: Bilateral airspace filling pattern consistent with widespread bronchopneumonia. Consolidation most dense in the right middle lobe. Moderate layering effusion on the right. Tiny layering effusion on the left. Upper Abdomen: Negative Musculoskeletal: Normal Review of the MIP images confirms the above findings. IMPRESSION: No pulmonary emboli. Widespread bilateral bronchopneumonia with consolidation most dense in the right middle lobe. Layering effusions, right larger than left. Nodal prominence in the mediastinum, likely reactive to the pneumonia. Aortic Atherosclerosis (ICD10-I70.0). Electronically Signed   By: Paulina FusiMark  Shogry M.D.   On: 03/22/2017 13:39   [place] Venous Img Lower Bilateral  Result Date: 03/22/2017 CLINICAL DATA:  Edema left greater than right x1 day. EXAM: BILATERAL LOWER EXTREMITY VENOUS DOPPLER ULTRASOUND TECHNIQUE: Gray-scale sonography with compression, as well as color and duplex ultrasound, were performed to evaluate the deep venous system from the level of the common femoral vein through the popliteal and proximal calf  veins. COMPARISON:  None FINDINGS: Normal compressibility of the common femoral, superficial femoral, and popliteal veins, as well as the proximal calf veins. No filling defects to suggest DVT on grayscale or color Doppler imaging. Doppler waveforms show normal direction of venous flow, normal respiratory phasicity and response to augmentation. IMPRESSION: No evidence of  lower extremity deep vein thrombosis, bilaterally. Electronically Signed   By: Corlis Leak  Hassell M.D.   On: 03/22/2017 15:11    ASSESSMENT AND PLAN:  1.Acute hypoxemic respiratory failure Resolving slowly Secondary to multifactorial process which includes acute extensive bilateral pneumonia, COPD exacerbation,  and systolic congestive heart failure exacerbation newly diagnosed  Continue supplemental oxygen with weaning as tolerated, nasal BiPAP  2.Acute sepsis Resolved Secondary toCAP Continue pneumonia protocol,empiric doxycycline/Rocephin for 7-day course  3.Acute extensive bilateral CAP Resolving Continue pneumonia protocol,antibiotics per above  4.Acute on COPD exacerbation  Resolving slowly Continue IV Solu-Medrol with tapering as tolerated, empiric doxycycline/Rocephin,inhaled corticosteroids twice daily, breathing treatments, RT following, and all other plans as stated above  5.Acute hypokalemia/hypomagnesemia Repleted  6.Chronic bipolar illness  Stable  Psychiatry did see patient while in house-their input is greatly appreciated Continue current regiment   7.Chronic anxiety/history of panic attacks Stable  Continue current regiment   8.  Newly diagnosed systolic congestive heart failure exacerbation Echocardiogram noted for ejection fraction 45-50% Continue IV Lasix, beta-blocker therapy, aspirin, lisinopril, and continue close medical monitoring  All the records are reviewed and case discussed with Care Management/Social Workerr. Management plans discussed with the patient,  family and they are in agreement.  CODE STATUS: full  TOTAL TIME TAKING CARE OF THIS PATIENT: 40 minutes.     POSSIBLE D/C IN 2-3 DAYS, DEPENDING ON CLINICAL CONDITION.   Evelena AsaMontell D Nethan Caudillo M.D on 03/23/2017  Between 7am to 6pm - Pager - [phone]  After 6pm go to [email or web address] - Social research officer, government  [facility]  Office  [phone]  CC: Primary care physician; Abram Sander, MD  Note: This dictation was prepared with Dragon dictation along with smaller phrase technology. Any transcriptional errors that result from this process are unintentional.

## 2017-03-24 LAB — BASIC METABOLIC PANEL
ANION GAP: 10 (ref 5–15)
BUN: 13 mg/dL (ref 6–20)
CALCIUM: 7.8 mg/dL — AB (ref 8.9–10.3)
CO2: 34 mmol/L — ABNORMAL HIGH (ref 22–32)
Chloride: 94 mmol/L — ABNORMAL LOW (ref 101–111)
Creatinine, Ser: 0.72 mg/dL (ref 0.44–1.00)
GFR calc Af Amer: >60 mL/min (ref >60)
GFR calc non Af Amer: >60 mL/min (ref >60)
GLUCOSE: 173 mg/dL — AB (ref 65–99)
Potassium: 3.6 mmol/L (ref 3.5–5.1)
SODIUM: 138 mmol/L (ref 135–145)

## 2017-03-24 LAB — CULTURE, BLOOD (ROUTINE X 2)
Culture: NO GROWTH
Culture: NO GROWTH

## 2017-03-24 LAB — GLUCOSE, CAPILLARY
GLUCOSE-CAPILLARY: 159 mg/dL — AB (ref 65–99)
GLUCOSE-CAPILLARY: 136 mg/dL — AB (ref 65–99)
Glucose-Capillary: 144 mg/dL — ABNORMAL HIGH (ref 65–99)
Glucose-Capillary: 139 mg/dL — ABNORMAL HIGH (ref 65–99)

## 2017-03-24 MED ORDER — FUROSEMIDE 10 MG/ML IJ SOLN
40.0000 mg | Freq: Two times a day (BID) | INTRAMUSCULAR | Status: DC
  Administered 2017-03-24 – 2017-03-25 (×2): 40 mg via INTRAVENOUS
  Filled 2017-03-24 (×2): qty 4

## 2017-03-24 MED ORDER — METOLAZONE 2.5 MG PO TABS
2.5000 mg | ORAL_TABLET | Freq: Once | ORAL | Status: AC
  Administered 2017-03-24: 2.5 mg via ORAL
  Filled 2017-03-24: qty 1

## 2017-03-24 NOTE — Consult Note (Signed)
[facility] Consult   Reason for Consult: Follow-up consult 52 year old woman with anxiety Referring Physician: Salary Patient Identification: Brandy Robinson MRN:  [id number] Principal Diagnosis: Generalized anxiety disorder Diagnosis:   Patient Active Problem List   Diagnosis Date Noted  . Generalized anxiety disorder [F41.1] 03/21/2017  . CAP (community acquired pneumonia) [J18.9] 03/18/2017  . Acute respiratory failure ([place]) [J96.00]   . Perforated viscus [R19.8] 12/30/2014  . Lichen sclerosus [G81.8] 09/24/2014  . Surgical menopause [E89.40] 09/24/2014  . Endometriosis [N80.9] 09/24/2014  . Bipolar 1 disorder ([place]) [F31.9] 09/24/2014  . Insomnia [G47.00] 09/24/2014  . GERD (gastroesophageal reflux disease) [K21.9] 09/24/2014  . History of stomach ulcers [Z87.19] 09/24/2014    Total Time spent with patient: 15 minutes  Subjective:   Brandy Robinson is a 52 y.o. female patient admitted with "I am feeling a lot better".  HPI: Follow-up to previous notes.  Patient says today she is feeling much better.  Still has limited sleep at night because of her physical health but is not feeling panicky or anxious during the day.  Mood better.  Optimistic.  No sign of confusion or delirium  Past Psychiatric History: History of chronic anxiety disorder  Risk to Self: Is patient at risk for suicide?: No Risk to Others:   Prior Inpatient Therapy:   Prior Outpatient Therapy:    Past Medical History:  Past Medical History:  Diagnosis Date  . Anxiety   . Bipolar disorder ([place])   . Depression   . Disc disorder   . Dyspareunia   . Endometriosis   . GERD (gastroesophageal reflux disease)   . Insomnia   . Lichen   . Surgical menopause   . Vaginal atrophy   . Wears glasses     Past Surgical History:  Procedure Laterality Date  . ABDOMINAL HYSTERECTOMY  1995   tah-bso  . ARM WOUND REPAIR / CLOSURE  2013   cellulitis rt arm-i/d  . KNEE BURSECTOMY Left 09/24/2013    Procedure: IRRIGATION AND DEBRIDEMENT OF LEFT KNEE ;  Surgeon: Renette Butters, MD;  Location: [facility];  Service: Orthopedics;  Laterality: Left;  . LAPAROTOMY N/A 12/30/2014   Procedure: EXPLORATORY LAPAROTOMY-graham patch of peptic ulcer;  Surgeon: Clayburn Pert, MD;  Location: [facility];  Service: General;  Laterality: N/A;   Family History:  Family History  Problem Relation Age of Onset  . Diabetes Mother   . Diabetes Sister   . Cancer Father        Throat  . Heart disease Neg Hx    Family Psychiatric  History: None Social History:  Social History   Substance and Sexual Activity  Alcohol Use No     Social History   Substance and Sexual Activity  Drug Use No    Social History   Socioeconomic History  . Marital status: Married    Spouse name: None  . Number of children: None  . Years of education: None  . Highest education level: None  Social Needs  . Financial resource strain: None  . Food insecurity - worry: None  . Food insecurity - inability: None  . Transportation needs - medical: None  . Transportation needs - non-medical: None  Occupational History  . None  Tobacco Use  . Smoking status: Current Every Day Smoker    Packs/day: 1.00    Types: Cigarettes  . Smokeless tobacco: Never Used  Substance and Sexual Activity  . Alcohol use: No  . Drug use:  No  . Sexual activity: Yes    Birth control/protection: Surgical  Other Topics Concern  . None  Social History Narrative  . None   Additional Social History:    Allergies:   Allergies  Allergen Reactions  . Hydrocodone Itching  . Aspirin Other (See Comments)    Reaction:  GI upset   . Morphine And Related Itching, Nausea And Vomiting and Other (See Comments)    Reaction:  Chest pain    Labs:  Results for orders placed or performed during the hospital encounter of 03/18/17 (from the past 48 hour(s))  Glucose, capillary     Status: Abnormal   Collection Time: 03/22/17  4:49 PM   Result Value Ref Range   Glucose-Capillary 146 (H) 65 - 99 mg/dL  Glucose, capillary     Status: Abnormal   Collection Time: 03/22/17  9:40 PM  Result Value Ref Range   Glucose-Capillary 127 (H) 65 - 99 mg/dL  Basic metabolic panel     Status: Abnormal   Collection Time: 03/23/17  4:08 AM  Result Value Ref Range   Sodium 138 135 - 145 mmol/L   Potassium 3.3 (L) 3.5 - 5.1 mmol/L   Chloride 95 (L) 101 - 111 mmol/L   CO2 33 (H) 22 - 32 mmol/L   Glucose, Bld 130 (H) 65 - 99 mg/dL   BUN 12 6 - 20 mg/dL   Creatinine, Ser 0.68 0.44 - 1.00 mg/dL   Calcium 7.5 (L) 8.9 - 10.3 mg/dL   GFR calc non Af Amer >60 >60 mL/min   GFR calc Af Amer >60 >60 mL/min    Comment: (NOTE) The eGFR has been calculated using the CKD EPI equation. This calculation has not been validated in all clinical situations. eGFR's persistently <60 mL/min signify possible Chronic Kidney Disease.    Anion gap 10 5 - 15    Comment: Performed at [facility], [address] [place], [place] [address]  Phosphorus     Status: None   Collection Time: 03/23/17  4:08 AM  Result Value Ref Range   Phosphorus 2.5 2.5 - 4.6 mg/dL    Comment: Performed at [facility], [address] [place], [place] [address]  Glucose, capillary     Status: Abnormal   Collection Time: 03/23/17  7:39 AM  Result Value Ref Range   Glucose-Capillary 163 (H) 65 - 99 mg/dL   Comment 1 Notify RN   Glucose, capillary     Status: Abnormal   Collection Time: 03/23/17 11:28 AM  Result Value Ref Range   Glucose-Capillary 116 (H) 65 - 99 mg/dL   Comment 1 Notify RN   Glucose, capillary     Status: Abnormal   Collection Time: 03/23/17  5:13 PM  Result Value Ref Range   Glucose-Capillary 139 (H) 65 - 99 mg/dL   Comment 1 Notify RN   Glucose, capillary     Status: Abnormal   Collection Time: 03/23/17  9:40 PM  Result Value Ref Range   Glucose-Capillary 179 (H) 65 - 99 mg/dL  Basic metabolic panel     Status: Abnormal    Collection Time: 03/24/17  5:12 AM  Result Value Ref Range   Sodium 138 135 - 145 mmol/L   Potassium 3.6 3.5 - 5.1 mmol/L   Chloride 94 (L) 101 - 111 mmol/L   CO2 34 (H) 22 - 32 mmol/L   Glucose, Bld 173 (H) 65 - 99 mg/dL   BUN 13 6 - 20  mg/dL   Creatinine, Ser 0.72 0.44 - 1.00 mg/dL   Calcium 7.8 (L) 8.9 - 10.3 mg/dL   GFR calc non Af Amer >60 >60 mL/min   GFR calc Af Amer >60 >60 mL/min    Comment: (NOTE) The eGFR has been calculated using the CKD EPI equation. This calculation has not been validated in all clinical situations. eGFR's persistently <60 mL/min signify possible Chronic Kidney Disease.    Anion gap 10 5 - 15    Comment: Performed at [facility], [address] [place], [place] [address]  Glucose, capillary     Status: Abnormal   Collection Time: 03/24/17  7:41 AM  Result Value Ref Range   Glucose-Capillary 159 (H) 65 - 99 mg/dL   Comment 1 Notify RN   Glucose, capillary     Status: Abnormal   Collection Time: 03/24/17 12:28 PM  Result Value Ref Range   Glucose-Capillary 136 (H) 65 - 99 mg/dL   Comment 1 Notify RN     Current Facility-Administered Medications  Medication Dose Route Frequency Provider Last Rate Last Dose  . acetaminophen (TYLENOL) tablet 650 mg  650 mg Oral Q6H PRN Amelia Jo, MD   650 mg at 03/24/17 0231   Or  . acetaminophen (TYLENOL) suppository 650 mg  650 mg Rectal Q6H PRN Amelia Jo, MD      . bisacodyl (DULCOLAX) EC tablet 5 mg  5 mg Oral Daily PRN Amelia Jo, MD      . budesonide (PULMICORT) nebulizer solution 0.5 mg  0.5 mg Nebulization BID Flora Lipps, MD   0.5 mg at 03/24/17 3614  . buPROPion (WELLBUTRIN SR) 12 hr tablet 150 mg  150 mg Oral Daily Amelia Jo, MD   150 mg at 03/24/17 1037  . busPIRone (BUSPAR) tablet 20 mg  20 mg Oral BID Cali Cuartas, Madie Reno, MD   20 mg at 03/24/17 1037  . carvedilol (COREG) tablet 12.5 mg  12.5 mg Oral BID WC Salary, Montell D, MD   12.5 mg at 03/24/17 0829  . cefTRIAXone  (ROCEPHIN) 1 g in sodium chloride 0.9 % 100 mL IVPB  1 g Intravenous Q24H Amelia Jo, MD   Stopped at 03/24/17 1114  . clonazePAM (KLONOPIN) disintegrating tablet 0.5 mg  0.5 mg Oral QHS Brayon Bielefeld T, MD   0.5 mg at 03/23/17 2237  . clonazePAM (KLONOPIN) tablet 0.25 mg  0.25 mg Oral TID PRN Jordynne Mccown, Madie Reno, MD   0.25 mg at 03/23/17 1259  . conjugated estrogens (PREMARIN) vaginal cream 0.5 Applicatorful  0.5 Applicatorful Vaginal Q M,W,F Amelia Jo, MD   0.5 Applicatorful at 43/15/40 1824  . docusate sodium (COLACE) capsule 100 mg  100 mg Oral BID Amelia Jo, MD   100 mg at 03/20/17 2231  . doxycycline (VIBRA-TABS) tablet 100 mg  100 mg Oral Q12H Salary, Montell D, MD   100 mg at 03/24/17 1037  . DULoxetine (CYMBALTA) DR capsule 30 mg  30 mg Oral TID Amelia Jo, MD   30 mg at 03/24/17 1037  . estradiol (ESTRACE) tablet 1.5 mg  1.5 mg Oral Daily Amelia Jo, MD   1.5 mg at 03/24/17 1037  . furosemide (LASIX) injection 40 mg  40 mg Intravenous Q12H Salary, Montell D, MD      . heparin injection 5,000 Units  5,000 Units Subcutaneous Q8H Amelia Jo, MD   5,000 Units at 03/24/17 [id number]  . ibuprofen (ADVIL,MOTRIN) tablet 400 mg  400 mg Oral Q6H PRN Mikael Spray,  NP   400 mg at 03/20/17 1929  . Influenza vac split quadrivalent PF (FLUARIX) injection 0.5 mL  0.5 mL Intramuscular Tomorrow-1000 Salary, Montell D, MD      . insulin aspart (novoLOG) injection 0-5 Units  0-5 Units Subcutaneous QHS Kasa, Kurian, MD      . insulin aspart (novoLOG) injection 0-9 Units  0-9 Units Subcutaneous TID WC Flora Lipps, MD   1 Units at 03/24/17 1239  . ipratropium-albuterol (DUONEB) 0.5-2.5 (3) MG/3ML nebulizer solution 3 mL  3 mL Nebulization QID Salary, Montell D, MD   3 mL at 03/24/17 1123  . lisinopril (PRINIVIL,ZESTRIL) tablet 2.5 mg  2.5 mg Oral Daily Salary, Montell D, MD   2.5 mg at 03/24/17 1037  . magic mouthwash  5 mL Oral BID PRN Loney Hering D, MD   5 mL at 03/23/17 1902  .  methylPREDNISolone sodium succinate (SOLU-MEDROL) 125 mg/2 mL injection 60 mg  60 mg Intravenous Q6H Salary, Montell D, MD   60 mg at 03/24/17 1452  . ondansetron (ZOFRAN) tablet 4 mg  4 mg Oral Q6H PRN Amelia Jo, MD   4 mg at 03/23/17 1259   Or  . ondansetron (ZOFRAN) injection 4 mg  4 mg Intravenous Q6H PRN Amelia Jo, MD   4 mg at 03/23/17 1805  . pantoprazole (PROTONIX) EC tablet 40 mg  40 mg Oral Daily Amelia Jo, MD   40 mg at 03/24/17 1037  . pneumococcal 23 valent vaccine (PNU-IMMUNE) injection 0.5 mL  0.5 mL Intramuscular Tomorrow-1000 Salary, Montell D, MD      . potassium chloride (KLOR-CON) packet 40 mEq  40 mEq Oral Daily Salary, Montell D, MD   40 mEq at 03/24/17 1036  . QUEtiapine (SEROQUEL XR) 24 hr tablet 200 mg  200 mg Oral QHS Amelia Jo, MD   200 mg at 03/23/17 2237  . traZODone (DESYREL) tablet 100 mg  100 mg Oral QHS PRN Gertrue Willette, Madie Reno, MD        Musculoskeletal: Strength & Muscle Tone: within normal limits Gait & Station: normal Patient leans: N/A  Psychiatric Specialty Exam: Physical Exam  Nursing note and vitals reviewed. Constitutional: She appears well-developed and well-nourished.  HENT:  Head: Normocephalic and atraumatic.  Eyes: Conjunctivae are normal. Pupils are equal, round, and reactive to light.  Neck: Normal range of motion.  Cardiovascular: Regular rhythm and normal heart sounds.  Respiratory: She is in respiratory distress.  GI: Soft.  Musculoskeletal: Normal range of motion.  Neurological: She is alert.  Skin: Skin is warm and dry.  Psychiatric: She has a normal mood and affect. Her speech is normal and behavior is normal. Judgment and thought content normal. Cognition and memory are normal.    Review of Systems  Constitutional: Negative.   HENT: Negative.   Eyes: Negative.   Respiratory: Negative.   Cardiovascular: Negative.   Gastrointestinal: Negative.   Musculoskeletal: Negative.   Skin: Negative.   Neurological:  Negative.   Psychiatric/Behavioral: Negative.     Blood pressure 127/64, pulse 76, temperature 98 F (36.7 C), temperature source Oral, resp. rate 17, height '5\' 3"'  (1.6 m), weight 80.7 kg (177 lb 14.4 oz), SpO2 93 %.Body mass index is 31.51 kg/m.  General Appearance: Fairly Groomed  Eye Contact:  Good  Speech:  Clear and Coherent  Volume:  Normal  Mood:  Euthymic  Affect:  Congruent  Thought Process:  Goal Directed  Orientation:  Full (Time, Place, and Person)  Thought Content:  Logical  Suicidal Thoughts:  No  Homicidal Thoughts:  No  Memory:  Immediate;   Good Recent;   Good Remote;   Good  Judgement:  Good  Insight:  Good  Psychomotor Activity:  Normal  Concentration:  Concentration: Good  Recall:  Good  Fund of Knowledge:  Good  Language:  Good  Akathisia:  Negative  Handed:  Right  AIMS (if indicated):     Assets:  Communication Skills Desire for Improvement Housing Physical Health Resilience Social Support  ADL's:  Intact  Cognition:  WNL  Sleep:        Treatment Plan Summary: Medication management and Plan Patient is stabilized on her current medicine and the occasional use of as needed benzodiazepines which are still appropriate if needed.  I would not add anything extra to her psychiatric medicine when it comes to discharge and she can follow-up with her usual outpatient provider.  Supportive counseling done.  Disposition: No evidence of imminent risk to self or others at present.   Patient does not meet criteria for psychiatric inpatient admission. Supportive therapy provided about ongoing stressors.  Alethia Berthold, MD 03/24/2017 3:19 PM

## 2017-03-24 NOTE — Progress Notes (Signed)
[facility] [place] [facility]   PATIENT NAME: Brandy Robinson    MR#:  [id number]  DATE OF BIRTH:  May 13, 1965  SUBJECTIVE:  CHIEF COMPLAINT:   Chief Complaint  Patient presents with  . Dizziness  Patient without complaint, case discussed with nursing staff-patient still requiring high flow oxygen via nasal cannula, continue to wean O2 off as tolerated  REVIEW OF SYSTEMS:  CONSTITUTIONAL: No fever, fatigue or weakness.  EYES: No blurred or double vision.  EARS, NOSE, AND THROAT: No tinnitus or ear pain.  RESPIRATORY: No cough, shortness of breath, wheezing or hemoptysis.  CARDIOVASCULAR: No chest pain, orthopnea, edema.  GASTROINTESTINAL: No nausea, vomiting, diarrhea or abdominal pain.  GENITOURINARY: No dysuria, hematuria.  ENDOCRINE: No polyuria, nocturia,  HEMATOLOGY: No anemia, easy bruising or bleeding SKIN: No rash or lesion. MUSCULOSKELETAL: No joint pain or arthritis.   NEUROLOGIC: No tingling, numbness, weakness.  PSYCHIATRY: No anxiety or depression.   ROS  DRUG ALLERGIES:   Allergies  Allergen Reactions  . Hydrocodone Itching  . Aspirin Other (See Comments)    Reaction:  GI upset   . Morphine And Related Itching, Nausea And Vomiting and Other (See Comments)    Reaction:  Chest pain    VITALS:  Blood pressure 131/63, pulse 90, temperature 98 F (36.7 C), temperature source Oral, resp. rate 17, height 5\' 3"  (1.6 m), weight 80.7 kg (177 lb 14.4 oz), SpO2 91 %.  PHYSICAL EXAMINATION:  GENERAL:  52 y.o.-year-old patient lying in the bed with no acute distress.  EYES: Pupils equal, round, reactive to light and accommodation. No scleral icterus. Extraocular muscles intact.  HEENT: Head atraumatic, normocephalic. Oropharynx and nasopharynx clear.  NECK:  Supple, no jugular venous distention. No thyroid enlargement, no tenderness.  LUNGS: Normal breath sounds bilaterally, no wheezing, rales,rhonchi or crepitation. No use of accessory muscles  of respiration.  CARDIOVASCULAR: S1, S2 normal. No murmurs, rubs, or gallops.  ABDOMEN: Soft, nontender, nondistended. Bowel sounds present. No organomegaly or mass.  EXTREMITIES: No pedal edema, cyanosis, or clubbing.  NEUROLOGIC: Cranial nerves II through XII are intact. Muscle strength 5/5 in all extremities. Sensation intact. Gait not checked.  PSYCHIATRIC: The patient is alert and oriented x 3.  SKIN: No obvious rash, lesion, or ulcer.   Physical Exam LABORATORY PANEL:   CBC Recent Labs  Lab 03/22/17 0821  WBC 10.1  HGB 10.9*  HCT 33.0*  PLT 281   ------------------------------------------------------------------------------------------------------------------  Chemistries  Recent Labs  Lab 03/20/17 0546  03/24/17 0512  NA 138   < > 138  K 3.3*   < > 3.6  CL 107   < > 94*  CO2 20*   < > 34*  GLUCOSE 217*   < > 173*  BUN 12   < > 13  CREATININE 0.72   < > 0.72  CALCIUM 7.3*   < > 7.8*  MG 2.9*  --   --    < > = values in this interval not displayed.   ------------------------------------------------------------------------------------------------------------------  Cardiac Enzymes No results for input(s): TROPONINI in the last 168 hours. ------------------------------------------------------------------------------------------------------------------  RADIOLOGY:  Ct Angio Chest Pe W Or Wo Contrast  Result Date: 03/22/2017 CLINICAL DATA:  Being treated for pneumonia. Worsening shortness of breath and oxygen saturation. EXAM: CT ANGIOGRAPHY CHEST WITH CONTRAST TECHNIQUE: Multidetector CT imaging of the chest was performed using the standard protocol during bolus administration of intravenous contrast. Multiplanar CT image reconstructions and MIPs were obtained to evaluate the vascular anatomy.  CONTRAST:  75mL ISOVUE-370 IOPAMIDOL (ISOVUE-370) INJECTION 76% COMPARISON:  Radiography 03/21/2017.  CT 03/18/2017. FINDINGS: Cardiovascular: Pulmonary arterial opacification is  excellent. There are no pulmonary emboli. There is aortic atherosclerosis, premature for age. No aneurysm. No coronary artery calcification is seen. Small amount of pericardial fluid in the superior recess. Mediastinum/Nodes: Calcified node in the subcarinal region. Mild reactive nodal prominence along the right paratracheal region. Lungs/Pleura: Bilateral airspace filling pattern consistent with widespread bronchopneumonia. Consolidation most dense in the right middle lobe. Moderate layering effusion on the right. Tiny layering effusion on the left. Upper Abdomen: Negative Musculoskeletal: Normal Review of the MIP images confirms the above findings. IMPRESSION: No pulmonary emboli. Widespread bilateral bronchopneumonia with consolidation most dense in the right middle lobe. Layering effusions, right larger than left. Nodal prominence in the mediastinum, likely reactive to the pneumonia. Aortic Atherosclerosis (ICD10-I70.0). Electronically Signed   By: Paulina Fusi M.D.   On: 03/22/2017 13:39   US Venous Img Lower Bilateral  Result Date: 03/22/2017 CLINICAL DATA:  Edema left greater than right x1 day. EXAM: BILATERAL LOWER EXTREMITY VENOUS DOPPLER ULTRASOUND TECHNIQUE: Gray-scale sonography with compression, as well as color and duplex ultrasound, were performed to evaluate the deep venous system from the level of the common femoral vein through the popliteal and proximal calf veins. COMPARISON:  None FINDINGS: Normal compressibility of the common femoral, superficial femoral, and popliteal veins, as well as the proximal calf veins. No filling defects to suggest DVT on grayscale or color Doppler imaging. Doppler waveforms show normal direction of venous flow, normal respiratory phasicity and response to augmentation. IMPRESSION: No evidence of  lower extremity deep vein thrombosis, bilaterally. Electronically Signed   By: Corlis Leak M.D.   On: 03/22/2017 15:11    ASSESSMENT AND PLAN:  1.Acute hypoxemic  respiratory failure Resolving slowly Secondary tomultifactorial process which includesacute extensive bilateral pneumonia,COPD exacerbation, and systolic congestive heart failure exacerbation newly diagnosed  Continue supplemental oxygen with weaning as tolerated, nasal BiPAP  2.Acute sepsis Resolved Secondary toCAP Treated on our sepsis protocol, empiric doxycycline/Rocephin for 7-10 day course given the extensive bilateral pneumonia  3.Acute extensive bilateral CAP Resolvingslowly Continue pneumonia protocol,antibiotics per above, mucolytic agents, breathing treatments, and wean O2 off as tolerated  4.Acute on COPD exacerbation, acute on chronic hypoxic respiratory failure Resolving slowly Continue IV Solu-Medrol with tapering as tolerated, empiric antibiotics per above, aggressive pulmonary toilet with bronchodilator therapy, pulmonology consulted,RTfollowing, all other plans as stated above, and suspect patient may require home oxygen status post discharge  5.Newly diagnosed systolic congestive heart failure exacerbation Echocardiogram noted for ejection fraction 45-50% Continue IV Lasix, beta-blocker therapy, aspirin, lisinopril, low-sodium/cardiac diet with fluid restriction  6. Acute hypokalemia/hypomagnesemia Repleted  7.Chronic bipolar illness  Stable Psychiatry did see patient while in house-their input is greatly appreciated Continue current regiment  8.Chronic anxiety/history of panic attacks Stable  Continue current regiment  Disposition home in 1-2 days, possible need for home oxygen or any complications   All the records are reviewed and case discussed with Care Management/Social Workerr. Management plans discussed with the patient, family and they are in agreement.  CODE STATUS: full  TOTAL TIME TAKING CARE OF THIS PATIENT: 35 minutes.     POSSIBLE D/C IN 1-3 DAYS, DEPENDING ON CLINICAL CONDITION.   Evelena Asa Ayako Tapanes  M.D on 03/24/2017   Between 7am to 6pm - Pager - [phone]  After 6pm go to [email or web address] - Social research officer, government  [facility]  Office  [phone]  CC: Primary care physician;  Abram SanderAdamo, Elena M, MD  Note: This dictation was prepared with Dragon dictation along with smaller phrase technology. Any transcriptional errors that result from this process are unintentional.

## 2017-03-24 NOTE — Progress Notes (Signed)
Pharmacy Electrolyte Monitoring Consult:  Pharmacy consulted to assist in monitoring and replacing electrolytes in this 52 y.o. female admitted on 03/18/2017 with Dizziness   Labs:  Sodium (mmol/L)  Date Value  03/24/2017 138  05/15/2014 139   Potassium (mmol/L)  Date Value  03/24/2017 3.6  05/15/2014 4.0   Magnesium (mg/dL)  Date Value  72/53/664403/04/2017 2.9 (H)   Phosphorus (mg/dL)  Date Value  03/47/425903/07/2017 2.5   Calcium (mg/dL)  Date Value  56/38/756403/08/2017 7.8 (L)   Calcium, Total (mg/dL)  Date Value  33/29/518804/28/2016 9.3   Albumin (g/dL)  Date Value  41/66/063003/05/2017 2.6 (L)  05/15/2014 3.7    Plan: Potassium is within range at 3.6, continue with KCl 40mEq PO daily. Follow up with AM labs.  Clovia CuffLisa Akaysha Cobern, PharmD, BCPS 03/24/2017 1:49 PM

## 2017-03-24 NOTE — Progress Notes (Signed)
[facility]* [facility]     Assessment and Plan:  Acute severe hypoxic respiratory failure, currently on high flow nasal cannula @33 %. Pulmonary edema and pneumonitis changes, suspect secondary to heart failure. Small right pleural effusion. Possible pneumonia, suspect viral.  -Continue diuresis for pulmonary edema. -Wean down high flow oxygen as tolerated --Wean down steroids as tolerated.  -Empiric antibiotics.  Date: 03/24/2017  MRN# [id number] Brandy Robinson [birth year]   Brandy Robinson is a 52 y.o. old female seen in follow up for chief complaint of  Chief Complaint  Patient presents with  . Dizziness     HPI:  Pt awake and alert, no new complaints, feeling well.   Doxycycline 3/5>>  Medication:    Current Facility-Administered Medications:  .  acetaminophen (TYLENOL) tablet 650 mg, 650 mg, Oral, Q6H PRN, 650 mg at 03/24/17 0231 **OR** acetaminophen (TYLENOL) suppository 650 mg, 650 mg, Rectal, Q6H PRN, Cammy CopaMaier, Angela, MD .  bisacodyl (DULCOLAX) EC tablet 5 mg, 5 mg, Oral, Daily PRN, Cammy CopaMaier, Angela, MD .  budesonide (PULMICORT) nebulizer solution 0.5 mg, 0.5 mg, Nebulization, BID, Belia HemanKasa, Kurian, MD, 0.5 mg at 03/24/17 [id number] .  buPROPion (WELLBUTRIN SR) 12 hr tablet 150 mg, 150 mg, Oral, Daily, Cammy CopaMaier, Angela, MD, 150 mg at 03/23/17 1025 .  busPIRone (BUSPAR) tablet 20 mg, 20 mg, Oral, BID, Clapacs, Jackquline DenmarkJohn T, MD, 20 mg at 03/23/17 2237 .  carvedilol (COREG) tablet 12.5 mg, 12.5 mg, Oral, BID WC, Salary, Montell D, MD, 12.5 mg at 03/24/17 0829 .  cefTRIAXone (ROCEPHIN) 1 g in sodium chloride 0.9 % 100 mL IVPB, 1 g, Intravenous, Q24H, Cammy CopaMaier, Angela, MD, Stopped at 03/23/17 1059 .  clonazePAM (KLONOPIN) disintegrating tablet 0.5 mg, 0.5 mg, Oral, QHS, Clapacs, John T, MD, 0.5 mg at 03/23/17 2237 .  clonazePAM (KLONOPIN) tablet 0.25 mg, 0.25 mg, Oral, TID PRN, Clapacs, John T, MD, 0.25 mg at 03/23/17 1259 .  conjugated estrogens (PREMARIN) vaginal cream 0.5  Applicatorful, 0.5 Applicatorful, Vaginal, Q M,W,F, Cammy CopaMaier, Angela, MD, 0.5 Applicatorful at 03/22/17 1824 .  docusate sodium (COLACE) capsule 100 mg, 100 mg, Oral, BID, Cammy CopaMaier, Angela, MD, 100 mg at 03/20/17 2231 .  doxycycline (VIBRA-TABS) tablet 100 mg, 100 mg, Oral, Q12H, Salary, Montell D, MD, 100 mg at 03/23/17 2237 .  DULoxetine (CYMBALTA) DR capsule 30 mg, 30 mg, Oral, TID, Cammy CopaMaier, Angela, MD, 30 mg at 03/23/17 2237 .  estradiol (ESTRACE) tablet 1.5 mg, 1.5 mg, Oral, Daily, Cammy CopaMaier, Angela, MD, 1.5 mg at 03/23/17 1022 .  furosemide (LASIX) injection 20 mg, 20 mg, Intravenous, Q12H, Salary, Montell D, MD, 20 mg at 03/24/17 0828 .  heparin injection 5,000 Units, 5,000 Units, Subcutaneous, Q8H, Cammy CopaMaier, Angela, MD, 5,000 Units at 03/24/17 [id number] .  ibuprofen (ADVIL,MOTRIN) tablet 400 mg, 400 mg, Oral, Q6H PRN, Luci Bankukov, Magadalene S, NP, 400 mg at 03/20/17 1929 .  Influenza vac split quadrivalent PF (FLUARIX) injection 0.5 mL, 0.5 mL, Intramuscular, Tomorrow-1000, Salary, Montell D, MD .  insulin aspart (novoLOG) injection 0-5 Units, 0-5 Units, Subcutaneous, QHS, Kasa, Kurian, MD .  insulin aspart (novoLOG) injection 0-9 Units, 0-9 Units, Subcutaneous, TID WC, Erin FullingKasa, Kurian, MD, 2 Units at 03/24/17 [id number] .  ipratropium-albuterol (DUONEB) 0.5-2.5 (3) MG/3ML nebulizer solution 3 mL, 3 mL, Nebulization, QID, Salary, Montell D, MD, 3 mL at 03/24/17 [id number] .  lisinopril (PRINIVIL,ZESTRIL) tablet 2.5 mg, 2.5 mg, Oral, Daily, Salary, Montell D, MD, 2.5 mg at 03/23/17 1447 .  magic mouthwash, 5 mL, Oral, BID PRN, Salary, Montell D,  MD, 5 mL at 03/23/17 1902 .  methylPREDNISolone sodium succinate (SOLU-MEDROL) 125 mg/2 mL injection 60 mg, 60 mg, Intravenous, Q6H, Salary, Montell D, MD, 60 mg at 03/24/17 0829 .  ondansetron (ZOFRAN) tablet 4 mg, 4 mg, Oral, Q6H PRN, 4 mg at 03/23/17 1259 **OR** ondansetron (ZOFRAN) injection 4 mg, 4 mg, Intravenous, Q6H PRN, Cammy Copa, MD, 4 mg at 03/23/17 1805 .  pantoprazole  (PROTONIX) EC tablet 40 mg, 40 mg, Oral, Daily, Cammy Copa, MD, 40 mg at 03/23/17 1023 .  pneumococcal 23 valent vaccine (PNU-IMMUNE) injection 0.5 mL, 0.5 mL, Intramuscular, Tomorrow-1000, Salary, Montell D, MD .  potassium chloride (KLOR-CON) packet 40 mEq, 40 mEq, Oral, Daily, Salary, Montell D, MD, 40 mEq at 03/23/17 1022 .  QUEtiapine (SEROQUEL XR) 24 hr tablet 200 mg, 200 mg, Oral, QHS, Cammy Copa, MD, 200 mg at 03/23/17 2237 .  traZODone (DESYREL) tablet 100 mg, 100 mg, Oral, QHS PRN, Clapacs, John T, MD   Allergies:  Hydrocodone; Aspirin; and Morphine and related  Review of Systems: Gen:  Denies  fever, sweats. HEENT: Denies blurred vision. Cvc:  No dizziness, chest pain or heaviness Resp:   Denies cough or sputum porduction. Gi: Denies swallowing difficulty, stomach pain. constipation, bowel incontinence Gu:  Denies bladder incontinence, burning urine Ext:   No Joint pain, stiffness. Skin: No skin rash, easy bruising. Endoc:  No polyuria, polydipsia. Psych: No depression, insomnia. Other:  All other systems were reviewed and found to be negative other than what is mentioned in the HPI.   Physical Examination:   VS: BP 131/63   Pulse 90   Temp 98 F (36.7 C) (Oral)   Resp 17   Ht 5\' 3"  (1.6 m)   Wt 177 lb 14.4 oz (80.7 kg)   SpO2 91%   BMI 31.51 kg/m    General Appearance: No distress  Neuro:without focal findings,  speech normal,  HEENT: PERRLA, EOM intact. Pulmonary: normal breath sounds, No wheezing.   CardiovascularNormal S1,S2.  No m/r/g.   Abdomen: Benign, Soft, non-tender. Renal:  No costovertebral tenderness  GU:  Not performed at this time. Endoc: No evident thyromegaly, no signs of acromegaly. Skin:   warm, no rash. Extremities: normal, no cyanosis, clubbing.   LABORATORY PANEL:   CBC Recent Labs  Lab 03/22/17 0821  WBC 10.1  HGB 10.9*  HCT 33.0*  PLT 281    ------------------------------------------------------------------------------------------------------------------  Chemistries  Recent Labs  Lab 03/20/17 0546  03/24/17 0512  NA 138   < > 138  K 3.3*   < > 3.6  CL 107   < > 94*  CO2 20*   < > 34*  GLUCOSE 217*   < > 173*  BUN 12   < > 13  CREATININE 0.72   < > 0.72  CALCIUM 7.3*   < > 7.8*  MG 2.9*  --   --    < > = values in this interval not displayed.   ------------------------------------------------------------------------------------------------------------------  Cardiac Enzymes No results for input(s): TROPONINI in the last 168 hours. ------------------------------------------------------------  RADIOLOGY:   No results found for this or any previous visit. Results for orders placed during the hospital encounter of 03/18/17  DG Chest 2 View   Narrative CLINICAL DATA:  Pneumonia, COPD, smoker  EXAM: CHEST  2 VIEW  COMPARISON:  11/26/2016; correlation CT chest 03/18/2017  FINDINGS: Stable heart size and mediastinal contours.  Severe diffuse airspace infiltrates bilaterally significantly increased since previous exam compatible with multifocal pneumonia.  Air bronchograms at RIGHT lung base.  No definite pleural effusion or pneumothorax.  Bones unremarkable.  IMPRESSION: Significant progression of BILATERAL pulmonary infiltrates since 03/18/2017.   Electronically Signed   By: Ulyses Southward M.D.   On: 03/21/2017 13:37    ------------------------------------------------------------------------------------------------------------------  Thank  you for allowing [facility] to assist in the care of your patient. Our recommendations are noted above.  Please contact us if we can be of further service.   Wells Guiles, MD.  [facility] Office Number: [phone]  Santiago Glad, M.D.  Billy Fischer, M.D  03/24/2017

## 2017-03-25 HISTORY — PX: TRANSTHORACIC ECHOCARDIOGRAM: SHX275

## 2017-03-25 LAB — BASIC METABOLIC PANEL
ANION GAP: 11 (ref 5–15)
BUN: 17 mg/dL (ref 6–20)
CALCIUM: 8.4 mg/dL — AB (ref 8.9–10.3)
CO2: 38 mmol/L — ABNORMAL HIGH (ref 22–32)
Chloride: 87 mmol/L — ABNORMAL LOW (ref 101–111)
Creatinine, Ser: 0.84 mg/dL (ref 0.44–1.00)
GFR calc Af Amer: >60 mL/min (ref >60)
Glucose, Bld: 167 mg/dL — ABNORMAL HIGH (ref 65–99)
Potassium: 3.5 mmol/L (ref 3.5–5.1)
SODIUM: 136 mmol/L (ref 135–145)

## 2017-03-25 LAB — BLOOD GAS, ARTERIAL
Acid-Base Excess: 20.8 mmol/L — ABNORMAL HIGH (ref 0.0–2.0)
BICARBONATE: 46.2 mmol/L — AB (ref 20.0–28.0)
FIO2: 28
O2 SAT: 94.5 %
PATIENT TEMPERATURE: 37
pCO2 arterial: 54 mmHg — ABNORMAL HIGH (ref 32.0–48.0)
pH, Arterial: 7.54 — ABNORMAL HIGH (ref 7.350–7.450)
pO2, Arterial: 64 mmHg — ABNORMAL LOW (ref 83.0–108.0)

## 2017-03-25 LAB — BRAIN NATRIURETIC PEPTIDE: B NATRIURETIC PEPTIDE 5: 577 pg/mL — AB (ref 0.0–100.0)

## 2017-03-25 LAB — GLUCOSE, CAPILLARY
GLUCOSE-CAPILLARY: 162 mg/dL — AB (ref 65–99)
GLUCOSE-CAPILLARY: 230 mg/dL — AB (ref 65–99)

## 2017-03-25 MED ORDER — CARVEDILOL 12.5 MG PO TABS: 12.5000 mg | ORAL_TABLET | Freq: Two times a day (BID) | ORAL | 0 refills | Status: DC

## 2017-03-25 MED ORDER — LISINOPRIL 2.5 MG PO TABS: 2.5000 mg | ORAL_TABLET | Freq: Every day | ORAL | 0 refills | Status: DC

## 2017-03-25 MED ORDER — PREDNISONE 10 MG (21) PO TBPK: 10.0000 mg | ORAL_TABLET | Freq: Every day | ORAL | 0 refills | Status: DC

## 2017-03-25 MED ORDER — CLONAZEPAM 0.5 MG PO TABS: 0.2500 mg | ORAL_TABLET | Freq: Three times a day (TID) | ORAL | 0 refills | Status: DC | PRN

## 2017-03-25 MED ORDER — DOXYCYCLINE HYCLATE 100 MG PO TABS: 100.0000 mg | ORAL_TABLET | Freq: Two times a day (BID) | ORAL | 0 refills | Status: DC

## 2017-03-25 MED ORDER — BISACODYL 5 MG PO TBEC: 5.0000 mg | DELAYED_RELEASE_TABLET | Freq: Every day | ORAL | 0 refills | Status: DC | PRN

## 2017-03-25 MED ORDER — ALBUTEROL SULFATE HFA 108 (90 BASE) MCG/ACT IN AERS: 2.0000 | INHALATION_SPRAY | Freq: Four times a day (QID) | RESPIRATORY_TRACT | 2 refills | Status: DC | PRN

## 2017-03-25 MED ORDER — CLONAZEPAM 0.5 MG PO TBDP: 0.5000 mg | ORAL_TABLET | Freq: Every day | ORAL | 0 refills | Status: DC

## 2017-03-25 MED ORDER — TRAZODONE HCL 100 MG PO TABS: 100.0000 mg | ORAL_TABLET | Freq: Every evening | ORAL | 0 refills | Status: DC | PRN

## 2017-03-25 MED ORDER — POTASSIUM CHLORIDE 20 MEQ PO PACK: 40.0000 meq | PACK | Freq: Every day | ORAL | 0 refills | Status: DC

## 2017-03-25 MED ORDER — QUETIAPINE FUMARATE ER 200 MG PO TB24: 200.0000 mg | ORAL_TABLET | Freq: Every day | ORAL | 0 refills | Status: DC

## 2017-03-25 MED ORDER — BUSPIRONE HCL 10 MG PO TABS: 20.0000 mg | ORAL_TABLET | Freq: Two times a day (BID) | ORAL | 0 refills | Status: DC

## 2017-03-25 MED ORDER — DULOXETINE HCL 30 MG PO CPEP: 30.0000 mg | ORAL_CAPSULE | Freq: Three times a day (TID) | ORAL | 0 refills | Status: DC

## 2017-03-25 MED ORDER — FUROSEMIDE 40 MG PO TABS: 40.0000 mg | ORAL_TABLET | Freq: Two times a day (BID) | ORAL | 1 refills | Status: DC

## 2017-03-25 MED ORDER — FLUTICASONE-SALMETEROL 100-50 MCG/DOSE IN AEPB: 1.0000 | INHALATION_SPRAY | Freq: Two times a day (BID) | RESPIRATORY_TRACT | 0 refills | Status: DC

## 2017-03-25 NOTE — Progress Notes (Signed)
SATURATION QUALIFICATIONS: (This note is used to comply with regulatory documentation for home oxygen)  Patient Saturations on Room Air at Rest = 89%  Patient Saturations on Room Air while Ambulating = 72%  Patient Saturations on 2 Liters of oxygen while Ambulating = 92%  Please briefly explain why patient needs home oxygen: Patients oxygen saturation drops while ambulating without oxygen.

## 2017-03-25 NOTE — Discharge Summary (Addendum)
[facility] [place] at [facility]   PATIENT NAME: Brandy Robinson    MR#:  [id number]  DATE OF BIRTH:  Sep 21, 1965  DATE OF ADMISSION:  03/18/2017 ADMITTING PHYSICIAN: Cammy Copa, MD  DATE OF DISCHARGE:  03/25/17   PRIMARY CARE PHYSICIAN: Abram Sander, MD    ADMISSION DIAGNOSIS:  RUQ pain [R10.11] Sepsis, due to unspecified organism [facility]) [A41.9] Pneumonia of right middle lobe due to infectious organism (HCC) [J18.1]  DISCHARGE DIAGNOSIS:  Principal Problem:   Generalized anxiety disorder Active Problems:   Bipolar 1 disorder (HCC)   CAP (community acquired pneumonia)   SECONDARY DIAGNOSIS:   Past Medical History:  Diagnosis Date  . Anxiety   . Bipolar disorder (HCC)   . Depression   . Disc disorder   . Dyspareunia   . Endometriosis   . GERD (gastroesophageal reflux disease)   . Insomnia   . Lichen   . Surgical menopause   . Vaginal atrophy   . Wears glasses     HOSPITAL COURSE:  HISTORY OF PRESENT ILLNESS: Brandy Robinson  is a 52 y.o. female with a known history of bipolar disorder, anxiety and depression disorder, insomnia. Patient presented to emergency room for acute onset of nausea, vomiting and diffuse abdominal pain that started in the past 2 days, gradually getting worse.  She has been so weak that she even had a fall at home, yesterday.  Patient denies any chest pain or shortness of breath, no fever or chills no bleeding. Upon evaluation in the emergency room she was found hypoxic tachypneic and tachycardic.  Oxygen saturation was 83% at the arrival to emergency room.  Blood tests are notable for elevated WBC at 16.2 and low magnesium and potassium levels.  Lactic acid, within normal limits. CAT scan of the chest and the abdomen, reviewed by myself, are remarkable for right middle lobe infiltrate and fatty liver. Patient is admitted for further evaluation and treatment.  1.Acute hypoxemic respiratory failure Resolved but still  requiring 2 L of oxygen and patient qualified for home oxygen 2 L Secondary tomultifactorial process which includesacuteextensive bilateralpneumonia,COPD exacerbation,and systolic congestive heart failure exacerbation newly diagnosed  Continue supplemental oxygen , outpatient follow-up with pulmonology as recommended  2.Acute sepsis Resolved Secondary toCAP Treated on our sepsis protocol, empiric doxycycline/Rocephinfor 7-10 day course given the extensive bilateral pneumonia  3.Acute extensive bilateralCAP Resolvingslowly Continue pneumonia protocol,antibiotics per above, mucolytic agents, breathing treatments, and wean O2 off as tolerated  4.Acute on COPD exacerbation, acute on chronic hypoxic respiratory failure Resolving slowly Continue IV Solu-Medrol with tapering as tolerated, empiric antibiotics per above, aggressive pulmonary toilet with bronchodilator therapy, pulmonology consulted,RTfollowing, all other plans as stated above, and suspect patient may require home oxygen status post discharge  5.Newly diagnosed systolic congestive heart failure exacerbation Echocardiogram noted for ejection fraction 45-50% Continue  Lasix, beta-blocker therapy, lisinopril, low-sodium/cardiac diet with fluid  restriction Patient is allergic to aspirin and refusing Outpatient follow-up with cardiology and a CHF clinic Daily weight monitoring, monitor intake and output   6. Acute hypokalemia/hypomagnesemia Repleted  7.Chronic bipolar illness  Stable Psychiatry did see patient while in house-their input is greatly appreciated Continue current regiment.  Outpatient follow-up with patient's primary psychiatrist as recommended  8.Chronic anxiety/history of panic attacks Stable  Continue current regiment  Disposition home with home O2  PT consult-recommending home health PT, RN and ambulate with rolling walker  DISCHARGE CONDITIONS:   Stable    CONSULTS OBTAINED:  Treatment Team:  Angelina Ok  D, MD Clapacs, Jackquline Denmark, MD   PROCEDURES  None   DRUG ALLERGIES:   Allergies  Allergen Reactions  . Hydrocodone Itching  . Aspirin Other (See Comments)    Reaction:  GI upset   . Morphine And Related Itching, Nausea And Vomiting and Other (See Comments)    Reaction:  Chest pain    DISCHARGE MEDICATIONS:   Allergies as of 03/25/2017      Reactions   Hydrocodone Itching   Aspirin Other (See Comments)   Reaction:  GI upset    Morphine And Related Itching, Nausea And Vomiting, Other (See Comments)   Reaction:  Chest pain      Medication List    STOP taking these medications   celecoxib 100 MG capsule Commonly known as:  CELEBREX   clonazePAM 0.5 MG tablet Commonly known as:  KLONOPIN Replaced by:  clonazePAM 0.5 MG disintegrating tablet   cyclobenzaprine 10 MG tablet Commonly known as:  FLEXERIL   famotidine 40 MG tablet Commonly known as:  PEPCID   fluconazole 150 MG tablet Commonly known as:  DIFLUCAN   metoCLOPramide 10 MG tablet Commonly known as:  REGLAN   omeprazole 40 MG capsule Commonly known as:  PRILOSEC   ondansetron 4 MG disintegrating tablet Commonly known as:  ZOFRAN ODT   oxyCODONE-acetaminophen 5-325 MG tablet Commonly known as:  ROXICET   sucralfate 1 g tablet Commonly known as:  CARAFATE   sulfamethoxazole-trimethoprim 800-160 MG tablet Commonly known as:  BACTRIM DS,SEPTRA DS   traMADol 50 MG tablet Commonly known as:  ULTRAM     TAKE these medications   albuterol 108 (90 Base) MCG/ACT inhaler Commonly known as:  PROVENTIL HFA;VENTOLIN HFA Inhale 2 puffs into the lungs every 6 (six) hours as needed for wheezing or shortness of breath.   bisacodyl 5 MG EC tablet Commonly known as:  DULCOLAX Take 1 tablet (5 mg total) by mouth daily as needed for moderate constipation.   buPROPion 150 MG 12 hr tablet Commonly known as:  WELLBUTRIN SR Take 150 mg by mouth daily.    busPIRone 10 MG tablet Commonly known as:  BUSPAR Take 2 tablets (20 mg total) by mouth 2 (two) times daily. What changed:    how much to take  when to take this   carvedilol 12.5 MG tablet Commonly known as:  COREG Take 1 tablet (12.5 mg total) by mouth 2 (two) times daily with a meal.   clonazePAM 0.5 MG disintegrating tablet Commonly known as:  KLONOPIN Take 1 tablet (0.5 mg total) by mouth at bedtime. Replaces:  clonazePAM 0.5 MG tablet   clonazePAM 0.5 MG tablet Commonly known as:  KLONOPIN Take 0.5 tablets (0.25 mg total) by mouth 3 (three) times daily as needed (anxiety).   conjugated estrogens vaginal cream Commonly known as:  PREMARIN Place 0.5 Applicatorfuls vaginally every Monday, Wednesday, and Friday.   doxycycline 100 MG tablet Commonly known as:  VIBRA-TABS Take 1 tablet (100 mg total) by mouth every 12 (twelve) hours.   DULoxetine 30 MG capsule Commonly known as:  CYMBALTA Take 1 capsule (30 mg total) by mouth 3 (three) times daily. What changed:  when to take this   estradiol 1 MG tablet Commonly known as:  ESTRACE TAKE 1 1/2 (1.5MG ) BY MOUTH EVERY DAY   Fluticasone-Salmeterol 100-50 MCG/DOSE Aepb Commonly known as:  ADVAIR DISKUS Inhale 1 puff into the lungs 2 (two) times daily.   furosemide 40 MG tablet Commonly known as:  LASIX Take 1  tablet (40 mg total) by mouth 2 (two) times daily.   lisinopril 2.5 MG tablet Commonly known as:  PRINIVIL,ZESTRIL Take 1 tablet (2.5 mg total) by mouth daily. Start taking on:  03/26/2017   mometasone 0.1 % ointment Commonly known as:  ELOCON Apply 1 application topically 2 (two) times a week.   pantoprazole 40 MG tablet Commonly known as:  PROTONIX Take 1 tablet (40 mg total) by mouth daily.   potassium chloride 20 MEQ packet Commonly known as:  KLOR-CON Take 40 mEq by mouth daily. Start taking on:  03/26/2017   predniSONE 10 MG (21) Tbpk tablet Commonly known as:  STERAPRED UNI-PAK 21 TAB Take 1  tablet (10 mg total) by mouth daily. Take 6 tablets by mouth for 1 day followed by  5 tablets by mouth for 1 day followed by  4 tablets by mouth for 1 day followed by  3 tablets by mouth for 1 day followed by  2 tablets by mouth for 1 day followed by  1 tablet by mouth for a day and stop   QUEtiapine 200 MG 24 hr tablet Commonly known as:  SEROQUEL XR Take 1 tablet (200 mg total) by mouth at bedtime. What changed:    medication strength  how much to take  Another medication with the same name was removed. Continue taking this medication, and follow the directions you see here.   traZODone 100 MG tablet Commonly known as:  DESYREL Take 1 tablet (100 mg total) by mouth at bedtime as needed for sleep.            Durable Medical Equipment  (From admission, onward)        Start     Ordered   03/25/17 1229  For home use only DME oxygen  Once    Question Answer Comment  Mode or (Route) Nasal cannula   Liters per Minute 2   Frequency Continuous (stationary and portable oxygen unit needed)   Oxygen conserving device Yes   Oxygen delivery system Gas      03/25/17 1228       DISCHARGE INSTRUCTIONS:   Follow-up with primary care physician in 5-7 days Follow-up with cardiology in 1 week Follow-up with pulmonology in 1-2 weeks Continue 2 L of oxygen via nasal cannula Outpatient follow-up with [facility] CHF clinic in 3-4 days Daily weight monitoring Monitor intake and output  DIET:  Cardiac diet  DISCHARGE CONDITION:  Fair  ACTIVITY:  Activity as tolerated  OXYGEN:  Home Oxygen: Yes.     Oxygen Delivery: 2 liters/min via Patient connected to nasal cannula oxygen  DISCHARGE LOCATION:  home   If you experience worsening of your admission symptoms, develop shortness of breath, life threatening emergency, suicidal or homicidal thoughts you must seek medical attention immediately by calling 911 or calling your MD immediately  if symptoms less severe.  You Must read  complete instructions/literature along with all the possible adverse reactions/side effects for all the Medicines you take and that have been prescribed to you. Take any new Medicines after you have completely understood and accpet all the possible adverse reactions/side effects.   Please note  You were cared for by a hospitalist during your hospital stay. If you have any questions about your discharge medications or the care you received while you were in the hospital after you are discharged, you can call the unit and asked to speak with the hospitalist on call if the hospitalist that took care of you is  not available. Once you are discharged, your primary care physician will handle any further medical issues. Please note that NO REFILLS for any discharge medications will be authorized once you are discharged, as it is imperative that you return to your primary care physician (or establish a relationship with a primary care physician if you do not have one) for your aftercare needs so that they can reassess your need for medications and monitor your lab values.     Today  Chief Complaint  Patient presents with  . Dizziness   Patient is feeling much better.  Denies any shortness of breath.  Answering all questions appropriately.  Wants to go home after PT evaluation.  Patient was qualified for 2 L of oxygen  ROS:  CONSTITUTIONAL: Denies fevers, chills. Denies any fatigue, weakness.  EYES: Denies blurry vision, double vision, eye pain. EARS, NOSE, THROAT: Denies tinnitus, ear pain, hearing loss. RESPIRATORY: Denies cough, wheeze, shortness of breath.  CARDIOVASCULAR: Denies chest pain, palpitations, edema.  GASTROINTESTINAL: Denies nausea, vomiting, diarrhea, abdominal pain. Denies bright red blood per rectum. GENITOURINARY: Denies dysuria, hematuria. ENDOCRINE: Denies nocturia or thyroid problems. HEMATOLOGIC AND LYMPHATIC: Denies easy bruising or bleeding. SKIN: Denies rash or  lesion. MUSCULOSKELETAL: Denies pain in neck, back, shoulder, knees, hips or arthritic symptoms.  NEUROLOGIC: Denies paralysis, paresthesias.  PSYCHIATRIC: Denies anxiety or depressive symptoms.   VITAL SIGNS:  Blood pressure 121/61, pulse 81, temperature 98.2 F (36.8 C), temperature source Oral, resp. rate 20, height 5\' 3"  (1.6 m), weight 80.7 kg (177 lb 14.4 oz), SpO2 93 %.  I/O:    Intake/Output Summary (Last 24 hours) at 03/25/2017 1250 Last data filed at 03/25/2017 1019 Gross per 24 hour  Intake 720 ml  Output 3600 ml  Net -2880 ml    PHYSICAL EXAMINATION:  GENERAL:  52 y.o.-year-old patient lying in the bed with no acute distress.  EYES: Pupils equal, round, reactive to light and accommodation. No scleral icterus. Extraocular muscles intact.  HEENT: Head atraumatic, normocephalic. Oropharynx and nasopharynx clear.  NECK:  Supple, no jugular venous distention. No thyroid enlargement, no tenderness.  LUNGS: Normal breath sounds bilaterally, no wheezing, rales,rhonchi or crepitation. No use of accessory muscles of respiration.  CARDIOVASCULAR: S1, S2 normal. No murmurs, rubs, or gallops.  ABDOMEN: Soft, non-tender, non-distended. Bowel sounds present. No organomegaly or mass.  EXTREMITIES: No pedal edema, cyanosis, or clubbing.  NEUROLOGIC: Cranial nerves II through XII are intact. Muscle strength 5/5 in all extremities. Sensation intact. Gait not checked.  PSYCHIATRIC: The patient is alert and oriented x 3.  SKIN: No obvious rash, lesion, or ulcer.   DATA REVIEW:   CBC Recent Labs  Lab 03/22/17 0821  WBC 10.1  HGB 10.9*  HCT 33.0*  PLT 281    Chemistries  Recent Labs  Lab 03/20/17 0546  03/25/17 0424  NA 138   < > 136  K 3.3*   < > 3.5  CL 107   < > 87*  CO2 20*   < > 38*  GLUCOSE 217*   < > 167*  BUN 12   < > 17  CREATININE 0.72   < > 0.84  CALCIUM 7.3*   < > 8.4*  MG 2.9*  --   --    < > = values in this interval not displayed.    Cardiac Enzymes No  results for input(s): TROPONINI in the last 168 hours.  Microbiology Results  Results for orders placed or performed during the hospital encounter of  03/18/17  Culture, blood (routine x 2)     Status: None   Collection Time: 03/18/17 11:59 PM  Result Value Ref Range Status   Specimen Description BLOOD RIGHT ANTECUBITAL  Final   Special Requests   Final    BOTTLES DRAWN AEROBIC AND ANAEROBIC Blood Culture results may not be optimal due to an excessive volume of blood received in culture bottles   Culture   Final    NO GROWTH 5 DAYS Performed at [facility], [address] [place], [place] [address]    Report Status 03/24/2017 FINAL  Final  Culture, blood (routine x 2)     Status: None   Collection Time: 03/19/17 12:12 AM  Result Value Ref Range Status   Specimen Description BLOOD BLOOD RIGHT FOREARM  Final   Special Requests   Final    BOTTLES DRAWN AEROBIC AND ANAEROBIC Blood Culture results may not be optimal due to an excessive volume of blood received in culture bottles   Culture   Final    NO GROWTH 5 DAYS Performed at [facility], [address] [place], [place] [address]    Report Status 03/24/2017 FINAL  Final  MRSA PCR Screening     Status: None   Collection Time: 03/19/17  1:47 AM  Result Value Ref Range Status   MRSA by PCR NEGATIVE NEGATIVE Final    Comment:        The GeneXpert MRSA Assay (FDA approved for NASAL specimens only), is one component of a comprehensive MRSA colonization surveillance program. It is not intended to diagnose MRSA infection nor to guide or monitor treatment for MRSA infections. Performed at [facility], [address] [place] [place], [place] [address]     RADIOLOGY:  Dg Chest 2 View  Result Date: 03/21/2017 CLINICAL DATA:  Pneumonia, COPD, smoker EXAM: CHEST  2 VIEW COMPARISON:  11/26/2016; correlation CT chest 03/18/2017 FINDINGS: Stable heart size and mediastinal contours. Severe diffuse airspace  infiltrates bilaterally significantly increased since previous exam compatible with multifocal pneumonia. Air bronchograms at RIGHT lung base. No definite pleural effusion or pneumothorax. Bones unremarkable. IMPRESSION: Significant progression of BILATERAL pulmonary infiltrates since 03/18/2017. Electronically Signed   By: Ulyses Southward M.D.   On: 03/21/2017 13:37   Ct Angio Chest Pe W Or Wo Contrast  Result Date: 03/22/2017 CLINICAL DATA:  Being treated for pneumonia. Worsening shortness of breath and oxygen saturation. EXAM: CT ANGIOGRAPHY CHEST WITH CONTRAST TECHNIQUE: Multidetector CT imaging of the chest was performed using the standard protocol during bolus administration of intravenous contrast. Multiplanar CT image reconstructions and MIPs were obtained to evaluate the vascular anatomy. CONTRAST:  75mL ISOVUE-370 IOPAMIDOL (ISOVUE-370) INJECTION 76% COMPARISON:  Radiography 03/21/2017.  CT 03/18/2017. FINDINGS: Cardiovascular: Pulmonary arterial opacification is excellent. There are no pulmonary emboli. There is aortic atherosclerosis, premature for age. No aneurysm. No coronary artery calcification is seen. Small amount of pericardial fluid in the superior recess. Mediastinum/Nodes: Calcified node in the subcarinal region. Mild reactive nodal prominence along the right paratracheal region. Lungs/Pleura: Bilateral airspace filling pattern consistent with widespread bronchopneumonia. Consolidation most dense in the right middle lobe. Moderate layering effusion on the right. Tiny layering effusion on the left. Upper Abdomen: Negative Musculoskeletal: Normal Review of the MIP images confirms the above findings. IMPRESSION: No pulmonary emboli. Widespread bilateral bronchopneumonia with consolidation most dense in the right middle lobe. Layering effusions, right larger than left. Nodal prominence in the mediastinum, likely reactive to the pneumonia. Aortic Atherosclerosis (ICD10-I70.0). Electronically Signed  By: Paulina FusiMark  Shogry M.D.   On: 03/22/2017 13:39   [place] Venous Img Lower Bilateral  Result Date: 03/22/2017 CLINICAL DATA:  Edema left greater than right x1 day. EXAM: BILATERAL LOWER EXTREMITY VENOUS DOPPLER ULTRASOUND TECHNIQUE: Gray-scale sonography with compression, as well as color and duplex ultrasound, were performed to evaluate the deep venous system from the level of the common femoral vein through the popliteal and proximal calf veins. COMPARISON:  None FINDINGS: Normal compressibility of the common femoral, superficial femoral, and popliteal veins, as well as the proximal calf veins. No filling defects to suggest DVT on grayscale or color Doppler imaging. Doppler waveforms show normal direction of venous flow, normal respiratory phasicity and response to augmentation. IMPRESSION: No evidence of  lower extremity deep vein thrombosis, bilaterally. Electronically Signed   By: Corlis Leak  Hassell M.D.   On: 03/22/2017 15:11    EKG:   Orders placed or performed during the hospital encounter of 03/18/17  . ED EKG  . ED EKG      Management plans discussed with the patient, family and they are in agreement.  CODE STATUS:     Code Status Orders  (From admission, onward)        Start     Ordered   03/19/17 0051  Full code  Continuous     03/19/17 0051    Code Status History    Date Active Date Inactive Code Status Order ID Comments User Context   12/31/2014 02:26 01/12/2015 18:50 Full Code [id number]  Ricarda FrameWoodham, Charles, MD Inpatient      TOTAL TIME TAKING CARE OF THIS PATIENT: 43 minutes.   Note: This dictation was prepared with Dragon dictation along with smaller phrase technology. Any transcriptional errors that result from this process are unintentional.   @[facility] @  on 03/25/2017 at 12:50 PM  Between 7am to 6pm - Pager - [phone]  After 6pm go to [email or web address] - password [email or web address] [email or web address]  [facility]  Office  [phone]  CC: Primary care physician; Abram SanderAdamo, Elena M,  MD

## 2017-03-25 NOTE — Progress Notes (Signed)
Pharmacy Antibiotic Note  Brandy Robinson is a 52 y.o. female admitted on 03/18/2017 with pneumonia.  Pharmacy has been consulted for azithromycin/ceftriaxone; however, patient has an EKG that shows a QTc of 534 and has already received one dose of azithromycin in the ED. Spoke to MD to switch abx to doxycycline considering patient already has a prolonged QTc and is on other QTc prolonging medications. MD notified and agrees with plan.  Plan: Patient has already received azithromycin 500 mg and ceftriaxone 1g IV x 1 in ED  Will transition to doxycycline 100 mg IV bid to avoid QTc prolongation. Will continue ceftriaxone 1g IV daily  3/9: Day 8 of CTX, Day 7 doxycycline    Height: 5\' 3"  (160 cm) Weight: 177 lb 14.4 oz (80.7 kg) IBW/kg (Calculated) : 52.4  Temp (24hrs), Avg:98.1 F (36.7 C), Min:97.9 F (36.6 C), Max:98.2 F (36.8 C)  Recent Labs  Lab 03/18/17 1639 03/18/17 2345 03/19/17 0119  03/21/17 0543 03/22/17 0504 03/22/17 0821 03/23/17 0408 03/24/17 0512 03/25/17 0424  WBC 16.2*  --  14.9*  --   --   --  10.1  --   --   --   CREATININE 0.89  --  0.81   < > 0.69 0.63  --  0.68 0.72 0.84  LATICACIDVEN  --  1.6 1.8  --   --   --   --   --   --   --    < > = values in this interval not displayed.    Estimated Creatinine Clearance: 81.5 mL/min (by C-G formula based on SCr of 0.84 mg/dL).    Allergies  Allergen Reactions  . Hydrocodone Itching  . Aspirin Other (See Comments)    Reaction:  GI upset   . Morphine And Related Itching, Nausea And Vomiting and Other (See Comments)    Reaction:  Chest pain    Thank you for allowing pharmacy to be a part of this patient's care.  Bari MantisKristin Quashon Jesus PharmD Clinical Pharmacist 03/25/2017

## 2017-03-25 NOTE — Evaluation (Signed)
Physical Therapy Evaluation Patient Details Name: Brandy Robinson MRN: [id number] DOB: [birth year] Today's Date: 03/25/2017   History of Present Illness  52 yo female with onset of PNA, anxiety and cleared for DVT and PE.  PMHx:  atherosclerosis, ulcers, acute respiratory failure, lichen sclerosis, perforated viscus, encephalopathy, AMS, UTI.  Clinical Impression  Pt was not able to maintain O2 sats on room air with nursing but with PT was 92% at rest on 2L and with walk down to 85% and pulse up to 116. Her plan is to progress gait and balance as vitals permit, watching numbers and progressing to more independent function.  Her family is involved with her care and expect her to have their assistance.  Will follow her as inpt and then transition to HHPT and finally outpatient if appropriate.    Follow Up Recommendations Home health PT;Supervision/Assistance - 24 hour    Equipment Recommendations  Rolling walker with 5" wheels(get a prescription so pt may get one if needed)    Recommendations for Other Services       Precautions / Restrictions Precautions Precautions: Fall(telemetry) Restrictions Weight Bearing Restrictions: No      Mobility  Bed Mobility Overal bed mobility: Modified Independent             General bed mobility comments: extra time but mod I  Transfers Overall transfer level: Modified independent Equipment used: Rolling walker (2 wheeled);1 person hand held assist             General transfer comment: uses good hand placement on the walker  Ambulation/Gait Ambulation/Gait assistance: Min guard Ambulation Distance (Feet): 125 Feet Assistive device: 1 person hand held assist Gait Pattern/deviations: Step-to pattern;Step-through pattern;Decreased stride length;Wide base of support Gait velocity: reduced Gait velocity interpretation: Below normal speed for age/gender General Gait Details: mild lateral shifting but pt is able to get up the hall in a  fairly stable way, although sats dropped  Stairs            Wheelchair Mobility    Modified Rankin (Stroke Patients Only)       Balance Overall balance assessment: Needs assistance Sitting-balance support: Bilateral upper extremity supported;Feet supported Sitting balance-Leahy Scale: Good     Standing balance support: Single extremity supported Standing balance-Leahy Scale: Fair Standing balance comment: has moments of less than fair balance on the hall but no outright big LOB                             Pertinent Vitals/Pain Pain Assessment: No/denies pain    Home Living Family/patient expects to be discharged to:: Private residence Living Arrangements: Alone Available Help at Discharge: Family;Available PRN/intermittently Type of Home: House Home Access: Stairs to enter   [employer] of Steps: 2 Home Layout: One level Home Equipment: Walker - 2 wheels      Prior Function Level of Independence: Independent         Comments: did not need AD previously     Hand Dominance   Dominant Hand: Right    Extremity/Trunk Assessment   Upper Extremity Assessment Upper Extremity Assessment: Overall WFL for tasks assessed    Lower Extremity Assessment Lower Extremity Assessment: Generalized weakness(4- to4+ x 5 DF's)    Cervical / Trunk Assessment Cervical / Trunk Assessment: Normal  Communication   Communication: No difficulties  Cognition Arousal/Alertness: Awake/alert Behavior During Therapy: WFL for tasks assessed/performed Overall Cognitive Status: Within Functional Limits for tasks assessed  General Comments      Exercises     Assessment/Plan    PT Assessment Patient needs continued PT services  PT Problem List Decreased strength;Decreased range of motion;Decreased activity tolerance;Decreased balance;Decreased mobility;Decreased coordination;Decreased knowledge of  use of DME;Decreased safety awareness;Cardiopulmonary status limiting activity;Decreased skin integrity       PT Treatment Interventions DME instruction;Gait training;Functional mobility training;Therapeutic activities;Therapeutic exercise;Balance training;Neuromuscular re-education;Patient/family education    PT Goals (Current goals can be found in the Care Plan section)  Acute Rehab PT Goals Patient Stated Goal: to get stronger PT Goal Formulation: With patient Time For Goal Achievement: 04/08/17 Potential to Achieve Goals: Good    Frequency Min 2X/week   Barriers to discharge Decreased caregiver support home alone with O2 sats dropping    Co-evaluation               AM-PAC PT "6 Clicks" Daily Activity  Outcome Measure Difficulty turning over in bed (including adjusting bedclothes, sheets and blankets)?: A Little Difficulty moving from lying on back to sitting on the side of the bed? : A Little Difficulty sitting down on and standing up from a chair with arms (e.g., wheelchair, bedside commode, etc,.)?: A Little Help needed moving to and from a bed to chair (including a wheelchair)?: A Little Help needed walking in hospital room?: A Little Help needed climbing 3-5 steps with a railing? : A Little 6 Click Score: 18    End of Session Equipment Utilized During Treatment: Gait belt;Oxygen Activity Tolerance: Patient tolerated treatment well;Treatment limited secondary to medical complications (Comment)(O2 sats dropped with effort) Patient left: in bed;with call bell/phone within reach Nurse Communication: Mobility status PT Visit Diagnosis: Unsteadiness on feet (R26.81);Difficulty in walking, not elsewhere classified (R26.2);Dizziness and giddiness (R42)    Time: [id number] PT Time Calculation (min) (ACUTE ONLY): 24 min   Charges:   PT Evaluation $PT Eval Moderate Complexity: 1 Mod PT Treatments $Gait Training: 8-22 mins   PT G Codes:   PT G-Codes **NOT FOR  INPATIENT CLASS** Functional Assessment Tool Used: AM-PAC 6 Clicks Basic Mobility   Ivar DrapeRuth E Allannah Kempen 03/25/2017, 4:44 PM   Samul Dadauth Isahia Hollerbach, PT MS Acute Rehab Dept. Number: [facility] [phone] and [facility] [phone]

## 2017-03-25 NOTE — Discharge Instructions (Signed)
Follow-up with primary care physician in 5-7 days Follow-up with cardiology in 1 week Follow-up with pulmonology in 1-2 weeks Continue 2 L of oxygen via nasal cannula [facility]-4 days Daily weight monitoring Monitor intake and output

## 2017-03-25 NOTE — Progress Notes (Signed)
Pharmacy Electrolyte Monitoring Consult:  Pharmacy consulted to assist in monitoring and replacing electrolytes in this 52 y.o. female admitted on 03/18/2017 with Dizziness   Labs:  Sodium (mmol/L)  Date Value  03/25/2017 136  05/15/2014 139   Potassium (mmol/L)  Date Value  03/25/2017 3.5  05/15/2014 4.0   Magnesium (mg/dL)  Date Value  40/98/119103/04/2017 2.9 (H)   Phosphorus (mg/dL)  Date Value  47/82/956203/07/2017 2.5   Calcium (mg/dL)  Date Value  13/08/657803/09/2017 8.4 (L)   Calcium, Total (mg/dL)  Date Value  46/96/295204/28/2016 9.3   Albumin (g/dL)  Date Value  84/13/244003/05/2017 2.6 (L)  05/15/2014 3.7    Plan: K=3.5, continue with KCl 40mEq PO daily.Patient on lasix 40mg  IV q12h.  Follow up with AM labs.  Bari MantisKristin Jakin Pavao PharmD Clinical Pharmacist 03/25/2017

## 2017-03-25 NOTE — Progress Notes (Signed)
[name] to be D/C'd Home per MD order.  Discussed prescriptions and follow up appointments with the patient. Prescriptions given to patient, medication list explained in detail. Pt verbalized understanding.  Allergies as of 03/25/2017      Reactions   Hydrocodone Itching   Aspirin Other (See Comments)   Reaction:  GI upset    Morphine And Related Itching, Nausea And Vomiting, Other (See Comments)   Reaction:  Chest pain      Medication List    STOP taking these medications   celecoxib 100 MG capsule Commonly known as:  CELEBREX   clonazePAM 0.5 MG tablet Commonly known as:  KLONOPIN Replaced by:  clonazePAM 0.5 MG disintegrating tablet   cyclobenzaprine 10 MG tablet Commonly known as:  FLEXERIL   famotidine 40 MG tablet Commonly known as:  PEPCID   fluconazole 150 MG tablet Commonly known as:  DIFLUCAN   metoCLOPramide 10 MG tablet Commonly known as:  REGLAN   omeprazole 40 MG capsule Commonly known as:  PRILOSEC   ondansetron 4 MG disintegrating tablet Commonly known as:  ZOFRAN ODT   oxyCODONE-acetaminophen 5-325 MG tablet Commonly known as:  ROXICET   sucralfate 1 g tablet Commonly known as:  CARAFATE   sulfamethoxazole-trimethoprim 800-160 MG tablet Commonly known as:  BACTRIM DS,SEPTRA DS   traMADol 50 MG tablet Commonly known as:  ULTRAM     TAKE these medications   albuterol 108 (90 Base) MCG/ACT inhaler Commonly known as:  PROVENTIL HFA;VENTOLIN HFA Inhale 2 puffs into the lungs every 6 (six) hours as needed for wheezing or shortness of breath.   bisacodyl 5 MG EC tablet Commonly known as:  DULCOLAX Take 1 tablet (5 mg total) by mouth daily as needed for moderate constipation.   buPROPion 150 MG 12 hr tablet Commonly known as:  WELLBUTRIN SR Take 150 mg by mouth daily.   busPIRone 10 MG tablet Commonly known as:  BUSPAR Take 2 tablets (20 mg total) by mouth 2 (two) times daily. What changed:    how much to take  when to take  this   carvedilol 12.5 MG tablet Commonly known as:  COREG Take 1 tablet (12.5 mg total) by mouth 2 (two) times daily with a meal.   clonazePAM 0.5 MG disintegrating tablet Commonly known as:  KLONOPIN Take 1 tablet (0.5 mg total) by mouth at bedtime. Replaces:  clonazePAM 0.5 MG tablet   clonazePAM 0.5 MG tablet Commonly known as:  KLONOPIN Take 0.5 tablets (0.25 mg total) by mouth 3 (three) times daily as needed (anxiety).   conjugated estrogens vaginal cream Commonly known as:  PREMARIN Place 0.5 Applicatorfuls vaginally every Monday, Wednesday, and Friday.   doxycycline 100 MG tablet Commonly known as:  VIBRA-TABS Take 1 tablet (100 mg total) by mouth every 12 (twelve) hours.   DULoxetine 30 MG capsule Commonly known as:  CYMBALTA Take 1 capsule (30 mg total) by mouth 3 (three) times daily. What changed:  when to take this   estradiol 1 MG tablet Commonly known as:  ESTRACE TAKE 1 1/2 (1.5MG ) BY MOUTH EVERY DAY   Fluticasone-Salmeterol 100-50 MCG/DOSE Aepb Commonly known as:  ADVAIR DISKUS Inhale 1 puff into the lungs 2 (two) times daily.   furosemide 40 MG tablet Commonly known as:  LASIX Take 1 tablet (40 mg total) by mouth 2 (two) times daily.   lisinopril 2.5 MG tablet Commonly known as:  PRINIVIL,ZESTRIL Take 1 tablet (2.5 mg total) by mouth daily. Start taking on:  03/26/2017   mometasone 0.1 % ointment Commonly known as:  ELOCON Apply 1 application topically 2 (two) times a week.   pantoprazole 40 MG tablet Commonly known as:  PROTONIX Take 1 tablet (40 mg total) by mouth daily.   potassium chloride 20 MEQ packet Commonly known as:  KLOR-CON Take 40 mEq by mouth daily. Start taking on:  03/26/2017   predniSONE 10 MG (21) Tbpk tablet Commonly known as:  STERAPRED UNI-PAK 21 TAB Take 1 tablet (10 mg total) by mouth daily. Take 6 tablets by mouth for 1 day followed by  5 tablets by mouth for 1 day followed by  4 tablets by mouth for 1 day followed  by  3 tablets by mouth for 1 day followed by  2 tablets by mouth for 1 day followed by  1 tablet by mouth for a day and stop   QUEtiapine 200 MG 24 hr tablet Commonly known as:  SEROQUEL XR Take 1 tablet (200 mg total) by mouth at bedtime. What changed:    medication strength  how much to take  Another medication with the same name was removed. Continue taking this medication, and follow the directions you see here.   traZODone 100 MG tablet Commonly known as:  DESYREL Take 1 tablet (100 mg total) by mouth at bedtime as needed for sleep.            Durable Medical Equipment  (From admission, onward)        Start     Ordered   03/25/17 1335  For home use only DME Walker rolling  Once    Comments:  Rolling walker with 5 inch wheels  Question:  Patient needs a walker to treat with the following condition  Answer:  Weakness   03/25/17 1334   03/25/17 1229  For home use only DME oxygen  Once    Question Answer Comment  Mode or (Route) Nasal cannula   Liters per Minute 2   Frequency Continuous (stationary and portable oxygen unit needed)   Oxygen conserving device Yes   Oxygen delivery system Gas      03/25/17 1228      Vitals:   03/25/17 1406 03/25/17 1552  BP: (!) 120/51   Pulse: 73   Resp: 20   Temp: 97.8 F (36.6 C)   SpO2: 90% 92%    Skin clean, dry and intact without evidence of skin break down, no evidence of skin tears noted. IV catheter discontinued intact. Site without signs and symptoms of complications. Dressing and pressure applied. Pt denies pain at this time. No complaints noted. Pt was set up with oxygen for home use, walker, and home health.  An After Visit Summary was printed and given to the patient. Patient escorted via WC, and D/C home via private auto.  Brandy Robinson

## 2017-04-27 ENCOUNTER — Telehealth: Payer: Self-pay | Admitting: Pharmacy Technician

## 2017-04-27 NOTE — Telephone Encounter (Signed)
Received updated proof of income.  Patient eligible to receive medication assistance at [facility] until through 2019, as long as eligibility requirements continue to be met.  [facility]

## 2017-04-27 NOTE — Telephone Encounter (Signed)
Patient failed to provide 2019 poi.  No additional medication assistance will be provided by [facility] without the required proof of income documentation.  Patient notified by letter.  Jaton Eilers J. Jaysten Essner Care Manager [facility] 

## 2017-05-08 ENCOUNTER — Other Ambulatory Visit: Payer: Self-pay | Admitting: Obstetrics and Gynecology

## 2017-05-12 ENCOUNTER — Other Ambulatory Visit: Payer: Self-pay | Admitting: Obstetrics and Gynecology

## 2017-05-18 ENCOUNTER — Encounter: Payer: Self-pay | Admitting: Obstetrics and Gynecology

## 2017-05-18 ENCOUNTER — Ambulatory Visit: Payer: Self-pay | Admitting: Obstetrics and Gynecology

## 2017-05-18 VITALS — BP 139/77 | HR 99 | Ht 63.0 in | Wt 160.5 lb

## 2017-05-18 DIAGNOSIS — E8940 Asymptomatic postprocedural ovarian failure: Secondary | ICD-10-CM

## 2017-05-18 DIAGNOSIS — Z9079 Acquired absence of other genital organ(s): Secondary | ICD-10-CM

## 2017-05-18 DIAGNOSIS — Z1239 Encounter for other screening for malignant neoplasm of breast: Secondary | ICD-10-CM

## 2017-05-18 DIAGNOSIS — Z1231 Encounter for screening mammogram for malignant neoplasm of breast: Secondary | ICD-10-CM

## 2017-05-18 DIAGNOSIS — N809 Endometriosis, unspecified: Secondary | ICD-10-CM

## 2017-05-18 DIAGNOSIS — L900 Lichen sclerosus et atrophicus: Secondary | ICD-10-CM

## 2017-05-18 DIAGNOSIS — Z90722 Acquired absence of ovaries, bilateral: Secondary | ICD-10-CM

## 2017-05-18 DIAGNOSIS — Z90710 Acquired absence of both cervix and uterus: Secondary | ICD-10-CM

## 2017-05-18 DIAGNOSIS — Z01419 Encounter for gynecological examination (general) (routine) without abnormal findings: Secondary | ICD-10-CM

## 2017-05-18 DIAGNOSIS — N393 Stress incontinence (female) (male): Secondary | ICD-10-CM

## 2017-05-18 MED ORDER — ESTROGENS, CONJUGATED 0.625 MG/GM VA CREA: 0.2500 | TOPICAL_CREAM | VAGINAL | 12 refills | Status: DC

## 2017-05-18 MED ORDER — ESTRADIOL 1 MG PO TABS: 1.5000 mg | ORAL_TABLET | Freq: Every day | ORAL | 3 refills | Status: DC

## 2017-05-18 NOTE — Patient Instructions (Signed)
1.  No Pap smear is done.  No further Pap smears are needed 2.  Mammogram is ordered 3.  Screening labs are to be obtained through [facility] 4.  Continue with healthy eating and exercise 5.  Smoking cessation is strongly encouraged 6.  Referral is made to Dr. Jeannie Fend for consideration of pubovaginal sling for stress incontinence 7.  Estradiol 1.5 mg daily is refilled (alternate daily dosing-2 mg / 1 mg/2 mg / 1 mg) 8.  Continue with calcium and vitamin D supplementation 9.  Return in 1 year for annual exam   Health Maintenance for Postmenopausal Women Menopause is a normal process in which your reproductive ability comes to an end. This process happens gradually over a span of months to years, usually between the ages of 41 and 40. Menopause is complete when you have missed 12 consecutive menstrual periods. It is important to talk with your health care provider about some of the most common conditions that affect postmenopausal women, such as heart disease, cancer, and bone loss (osteoporosis). Adopting a healthy lifestyle and getting preventive care can help to promote your health and wellness. Those actions can also lower your chances of developing some of these common conditions. What should I know about menopause? During menopause, you may experience a number of symptoms, such as:  Moderate-to-severe hot flashes.  Night sweats.  Decrease in sex drive.  Mood swings.  Headaches.  Tiredness.  Irritability.  Memory problems.  Insomnia.  Choosing to treat or not to treat menopausal changes is an individual decision that you make with your health care provider. What should I know about hormone replacement therapy and supplements? Hormone therapy products are effective for treating symptoms that are associated with menopause, such as hot flashes and night sweats. Hormone replacement carries certain risks, especially as you become older. If you are thinking about using  estrogen or estrogen with progestin treatments, discuss the benefits and risks with your health care provider. What should I know about heart disease and stroke? Heart disease, heart attack, and stroke become more likely as you age. This may be due, in part, to the hormonal changes that your body experiences during menopause. These can affect how your body processes dietary fats, triglycerides, and cholesterol. Heart attack and stroke are both medical emergencies. There are many things that you can do to help prevent heart disease and stroke:  Have your blood pressure checked at least every 1-2 years. High blood pressure causes heart disease and increases the risk of stroke.  If you are 83-42 years old, ask your health care provider if you should take aspirin to prevent a heart attack or a stroke.  Do not use any tobacco products, including cigarettes, chewing tobacco, or electronic cigarettes. If you need help quitting, ask your health care provider.  It is important to eat a healthy diet and maintain a healthy weight. ? Be sure to include plenty of vegetables, fruits, low-fat dairy products, and lean protein. ? Avoid eating foods that are high in solid fats, added sugars, or salt (sodium).  Get regular exercise. This is one of the most important things that you can do for your health. ? Try to exercise for at least 150 minutes each week. The type of exercise that you do should increase your heart rate and make you sweat. This is known as moderate-intensity exercise. ? Try to do strengthening exercises at least twice each week. Do these in addition to the moderate-intensity exercise.  Know your  numbers.Ask your health care provider to check your cholesterol and your blood glucose. Continue to have your blood tested as directed by your health care provider.  What should I know about cancer screening? There are several types of cancer. Take the following steps to reduce your risk and to catch  any cancer development as early as possible. Breast Cancer  Practice breast self-awareness. ? This means understanding how your breasts normally appear and feel. ? It also means doing regular breast self-exams. Let your health care provider know about any changes, no matter how small.  If you are 50 or older, have a clinician do a breast exam (clinical breast exam or CBE) every year. Depending on your age, family history, and medical history, it may be recommended that you also have a yearly breast X-ray (mammogram).  If you have a family history of breast cancer, talk with your health care provider about genetic screening.  If you are at high risk for breast cancer, talk with your health care provider about having an MRI and a mammogram every year.  Breast cancer (BRCA) gene test is recommended for women who have family members with BRCA-related cancers. Results of the assessment will determine the need for genetic counseling and BRCA1 and for BRCA2 testing. BRCA-related cancers include these types: ? Breast. This occurs in males or females. ? Ovarian. ? Tubal. This may also be called fallopian tube cancer. ? Cancer of the abdominal or pelvic lining (peritoneal cancer). ? Prostate. ? Pancreatic.  Cervical, Uterine, and Ovarian Cancer Your health care provider may recommend that you be screened regularly for cancer of the pelvic organs. These include your ovaries, uterus, and vagina. This screening involves a pelvic exam, which includes checking for microscopic changes to the surface of your cervix (Pap test).  For women ages 21-65, health care providers may recommend a pelvic exam and a Pap test every three years. For women ages 45-65, they may recommend the Pap test and pelvic exam, combined with testing for human papilloma virus (HPV), every five years. Some types of HPV increase your risk of cervical cancer. Testing for HPV may also be done on women of any age who have unclear Pap test  results.  Other health care providers may not recommend any screening for nonpregnant women who are considered low risk for pelvic cancer and have no symptoms. Ask your health care provider if a screening pelvic exam is right for you.  If you have had past treatment for cervical cancer or a condition that could lead to cancer, you need Pap tests and screening for cancer for at least 20 years after your treatment. If Pap tests have been discontinued for you, your risk factors (such as having a new sexual partner) need to be reassessed to determine if you should start having screenings again. Some women have medical problems that increase the chance of getting cervical cancer. In these cases, your health care provider may recommend that you have screening and Pap tests more often.  If you have a family history of uterine cancer or ovarian cancer, talk with your health care provider about genetic screening.  If you have vaginal bleeding after reaching menopause, tell your health care provider.  There are currently no reliable tests available to screen for ovarian cancer.  Lung Cancer Lung cancer screening is recommended for adults 65-74 years old who are at high risk for lung cancer because of a history of smoking. A yearly low-dose CT scan of the lungs is  recommended if you:  Currently smoke.  Have a history of at least 30 pack-years of smoking and you currently smoke or have quit within the past 15 years. A pack-year is smoking an average of one pack of cigarettes per day for one year.  Yearly screening should:  Continue until it has been 15 years since you quit.  Stop if you develop a health problem that would prevent you from having lung cancer treatment.  Colorectal Cancer  This type of cancer can be detected and can often be prevented.  Routine colorectal cancer screening usually begins at age 26 and continues through age 52.  If you have risk factors for colon cancer, your health  care provider may recommend that you be screened at an earlier age.  If you have a family history of colorectal cancer, talk with your health care provider about genetic screening.  Your health care provider may also recommend using home test kits to check for hidden blood in your stool.  A small camera at the end of a tube can be used to examine your colon directly (sigmoidoscopy or colonoscopy). This is done to check for the earliest forms of colorectal cancer.  Direct examination of the colon should be repeated every 5-10 years until age 45. However, if early forms of precancerous polyps or small growths are found or if you have a family history or genetic risk for colorectal cancer, you may need to be screened more often.  Skin Cancer  Check your skin from head to toe regularly.  Monitor any moles. Be sure to tell your health care provider: ? About any new moles or changes in moles, especially if there is a change in a mole's shape or color. ? If you have a mole that is larger than the size of a pencil eraser.  If any of your family members has a history of skin cancer, especially at a young age, talk with your health care provider about genetic screening.  Always use sunscreen. Apply sunscreen liberally and repeatedly throughout the day.  Whenever you are outside, protect yourself by wearing long sleeves, pants, a wide-brimmed hat, and sunglasses.  What should I know about osteoporosis? Osteoporosis is a condition in which bone destruction happens more quickly than new bone creation. After menopause, you may be at an increased risk for osteoporosis. To help prevent osteoporosis or the bone fractures that can happen because of osteoporosis, the following is recommended:  If you are 26-68 years old, get at least 1,000 mg of calcium and at least 600 mg of vitamin D per day.  If you are older than age 80 but younger than age 69, get at least 1,200 mg of calcium and at least 600 mg of  vitamin D per day.  If you are older than age 49, get at least 1,200 mg of calcium and at least 800 mg of vitamin D per day.  Smoking and excessive alcohol intake increase the risk of osteoporosis. Eat foods that are rich in calcium and vitamin D, and do weight-bearing exercises several times each week as directed by your health care provider. What should I know about how menopause affects my mental health? Depression may occur at any age, but it is more common as you become older. Common symptoms of depression include:  Low or sad mood.  Changes in sleep patterns.  Changes in appetite or eating patterns.  Feeling an overall lack of motivation or enjoyment of activities that you previously enjoyed.  Frequent crying spells.  Talk with your health care provider if you think that you are experiencing depression. What should I know about immunizations? It is important that you get and maintain your immunizations. These include:  Tetanus, diphtheria, and pertussis (Tdap) booster vaccine.  Influenza every year before the flu season begins.  Pneumonia vaccine.  Shingles vaccine.  Your health care provider may also recommend other immunizations. This information is not intended to replace advice given to you by your health care provider. Make sure you discuss any questions you have with your health care provider. Document Released: 02/25/2005 Document Revised: 07/24/2015 Document Reviewed: 10/07/2014 Elsevier Interactive Patient Education  2018 [employer].

## 2017-05-18 NOTE — Progress Notes (Signed)
ANNUAL PREVENTATIVE CARE GYN  ENCOUNTER NOTE  Subjective:       Brandy Robinson is a 52 y.o. G65P1001 female here for a routine annual gynecologic exam.  Current complaints: 1.  Sui-patient does leak urine with coughing sneezing laughing lifting and exercising; she is a smoker and does have bouts of chronic bronchitis; patient is interested in medical or surgical intervention for her stress incontinence.  She does do Kegel exercises.  She does not have significant urgency symptoms.  She does not have significant nocturia.  She does not report any major interval health issues.  She is using estradiol 1.5 mg daily, but requests a change in her dosing regimen because the small pills are difficult to cut in half.  She is not experiencing any vasomotor symptoms while on ERT.   Gynecologic History No LMP recorded. Patient has had a hysterectomy.tah/BSO Contraception: status post hysterectomy Last AVW:[id number]. Results were: normal Last mammogram: 2014. Results were: normal History of endometriosis, asymptomatic History of lichen sclerosis, asymptomatic  Obstetric History OB History  Gravida Para Term Preterm AB Living  SAB TAB Ectopic Multiple Live Births          1    # Outcome Date GA Lbr Len/2nd Weight Sex Delivery Anes PTL Lv  1 Term 1989    M Vag-Spont   LIV    Past Medical History:  Diagnosis Date  . Anxiety   . Bipolar disorder (HCC)   . Depression   . Disc disorder   . Dyspareunia   . Endometriosis   . GERD (gastroesophageal reflux disease)   . Insomnia   . Lichen   . Surgical menopause   . Vaginal atrophy   . Wears glasses     Past Surgical History:  Procedure Laterality Date  . ABDOMINAL HYSTERECTOMY  1995   tah-bso  . ARM WOUND REPAIR / CLOSURE  2013   cellulitis rt arm-i/d  . KNEE BURSECTOMY Left 09/24/2013   Procedure: IRRIGATION AND DEBRIDEMENT OF LEFT KNEE ;  Surgeon: Sheral Apley, MD;  Location: [facility];  Service:  Orthopedics;  Laterality: Left;  . LAPAROTOMY N/A 12/30/2014   Procedure: EXPLORATORY LAPAROTOMY-graham patch of peptic ulcer;  Surgeon: Ricarda Frame, MD;  Location: [facility];  Service: General;  Laterality: N/A;    Current Outpatient Medications on File Prior to Visit  Medication Sig Dispense Refill  . albuterol (PROVENTIL HFA;VENTOLIN HFA) 108 (90 Base) MCG/ACT inhaler Inhale 2 puffs into the lungs every 6 (six) hours as needed for wheezing or shortness of breath. 1 Inhaler 2  . bisacodyl (DULCOLAX) 5 MG EC tablet Take 1 tablet (5 mg total) by mouth daily as needed for moderate constipation. 30 tablet 0  . buPROPion (WELLBUTRIN SR) 150 MG 12 hr tablet Take 150 mg by mouth daily.    . busPIRone (BUSPAR) 10 MG tablet Take 2 tablets (20 mg total) by mouth 2 (two) times daily. 60 tablet 0  . carvedilol (COREG) 12.5 MG tablet Take 1 tablet (12.5 mg total) by mouth 2 (two) times daily with a meal. 60 tablet 0  . clonazePAM (KLONOPIN) 0.5 MG disintegrating tablet Take 1 tablet (0.5 mg total) by mouth at bedtime. 30 tablet 0  . DULoxetine (CYMBALTA) 30 MG capsule Take 1 capsule (30 mg total) by mouth 3 (three) times daily. 90 capsule 0  . estradiol (ESTRACE) 1 MG tablet TAKE 1 1/2 (1.5MG ) BY MOUTH EVERY DAY 45 tablet  0  . Fluticasone-Salmeterol (ADVAIR DISKUS) 100-50 MCG/DOSE AEPB Inhale 1 puff into the lungs 2 (two) times daily. 1 each 0  . furosemide (LASIX) 40 MG tablet Take 1 tablet (40 mg total) by mouth 2 (two) times daily. 60 tablet 1  . lisinopril (PRINIVIL,ZESTRIL) 2.5 MG tablet Take 1 tablet (2.5 mg total) by mouth daily. 30 tablet 0  . pantoprazole (PROTONIX) 40 MG tablet Take 1 tablet (40 mg total) by mouth daily. 60 tablet 0  . potassium chloride (KLOR-CON) 20 MEQ packet Take 40 mEq by mouth daily. 30 tablet 0  . QUEtiapine (SEROQUEL XR) 200 MG 24 hr tablet Take 1 tablet (200 mg total) by mouth at bedtime. 30 tablet 0  . traZODone (DESYREL) 100 MG tablet Take 1 tablet (100 mg total) by  mouth at bedtime as needed for sleep. 30 tablet 0   No current facility-administered medications on file prior to visit.     Allergies  Allergen Reactions  . Hydrocodone Itching  . Aspirin Other (See Comments)    Reaction:  GI upset   . Morphine And Related Itching, Nausea And Vomiting and Other (See Comments)    Reaction:  Chest pain    Social History   Socioeconomic History  . Marital status: Married    Spouse name: Not on file  . Number of children: Not on file  . Years of education: Not on file  . Highest education level: Not on file  Occupational History  . Not on file  Social Needs  . Financial resource strain: Not on file  . Food insecurity:    Worry: Not on file    Inability: Not on file  . Transportation needs:    Medical: Not on file    Non-medical: Not on file  Tobacco Use  . Smoking status: Current Every Day Smoker    Packs/day: 1.00    Types: Cigarettes  . Smokeless tobacco: Never Used  Substance and Sexual Activity  . Alcohol use: No  . Drug use: No  . Sexual activity: Yes    Birth control/protection: Surgical  Lifestyle  . Physical activity:    Days per week: Not on file    Minutes per session: Not on file  . Stress: Not on file  Relationships  . Social connections:    Talks on phone: Not on file    Gets together: Not on file    Attends religious service: Not on file    Active member of club or organization: Not on file    Attends meetings of clubs or organizations: Not on file    Relationship status: Not on file  . Intimate partner violence:    Fear of current or ex partner: Not on file    Emotionally abused: Not on file    Physically abused: Not on file    Forced sexual activity: Not on file  Other Topics Concern  . Not on file  Social History Narrative  . Not on file    Family History  Problem Relation Age of Onset  . Diabetes Mother   . Diabetes Sister   . Cancer Father        Throat  . Heart disease Neg Hx     The  following portions of the patient's history were reviewed and updated as appropriate: allergies, current medications, past family history, past medical history, past social history, past surgical history and problem list.  Review of Systems Review of Systems  Constitutional:  No vasomotor symptoms (on ERT)  HENT: Negative.   Eyes: Negative.   Respiratory:       Smoker, has bouts of bronchitis  Cardiovascular: Negative.   Gastrointestinal: Negative.   Genitourinary: Negative for flank pain and urgency.       Stress incontinence symptoms include leaking urine with coughing, sneezing, laughing, lifting, and exercise  Musculoskeletal: Negative.   Skin: Negative.   Neurological: Negative.   Endo/Heme/Allergies: Negative.   Psychiatric/Behavioral: Negative.     Objective:   BP 139/77   Pulse 99   Ht  (1.6 m)   Wt 160 lb 8 oz (72.8 kg)   BMI 28.43 kg/m  CONSTITUTIONAL: Well-developed, well-nourished female in no acute distress.  PSYCHIATRIC: Normal mood and affect. Normal behavior. Normal judgment and thought content. NEUROLGIC: Alert and oriented to person, place, and time. Normal muscle tone coordination. No cranial nerve deficit noted. HENT:  Normocephalic, atraumatic, External right and left ear normal.  EYES: Conjunctivae and EOM are normal. No scleral icterus.  NECK: Normal range of motion, supple, no masses.  Normal thyroid.  SKIN: Skin is warm and dry. No rash noted. Not diaphoretic. No erythema. No pallor. CARDIOVASCULAR: Normal heart rate noted, regular rhythm, no murmur. RESPIRATORY: Clear to auscultation bilaterally. Effort and breath sounds normal, no problems with respiration noted. BREASTS: Symmetric in size. No masses, skin changes, nipple drainage, or lymphadenopathy. ABDOMEN: Soft, normal bowel sounds, no distention noted.  No tenderness, rebound or guarding.  Large laparotomy scar from ruptured viscus repair, healed, without evidence of hernia BLADDER:  Normal PELVIC:  External Genitalia: Normal  BUS: Normal  Vagina: Slightly narrowed introitus; does admit double digit exam; no significant lesions; mild atrophic changes; no significant vault prolapse  Cervix: Surgically absent  Uterus: Surgically absent  Adnexa: Normal; nonpalpable nontender  RV: External Exam NormaI, No Rectal Masses and Normal Sphincter tone  MUSCULOSKELETAL: Normal range of motion. No tenderness.  No cyanosis, clubbing, or edema.  2+ distal pulses. LYMPHATIC: No Axillary, Supraclavicular, or Inguinal Adenopathy.    Assessment:   Annual gynecologic examination 52 y.o. Contraception: status post hysterectomy BMI-28 Menopausal state, asymptomatic on ERT; desires modification of dosing regimen due to difficulty with cutting pills Stress urinary incontinence; not a pessary candidate due to anatomy restrictions Tobacco user History of endometriosis, asymptomatic History of lichen sclerosis, asymptomatic  Plan:  Pap: Not needed Mammogram: Ordered Stool Guaiac Testing:  Not Indicated Labs: [facility] Routine preventative health maintenance measures emphasized: Exercise/Diet/Weight control, Tobacco Warnings and Alcohol/Substance use risks Continue with calcium and vitamin D supplementation daily Estradiol 1.5 mg daily is refilled for a year-take medication alternating 2 mg / 1 mg/2 mg / 1 mg ETC  Referral to Dr. Brennan Bailey for pubovaginal sling for stress urinary incontinence management Return to Clinic - 1 Year   [place], [place]  Herold Harms, MD  Note: This dictation was prepared with Dragon dictation along with smaller phrase technology. Any transcriptional errors that result from this process are unintentional.

## 2017-05-19 DIAGNOSIS — N393 Stress incontinence (female) (male): Secondary | ICD-10-CM | POA: Insufficient documentation

## 2017-05-26 ENCOUNTER — Encounter: Payer: Self-pay | Admitting: Obstetrics and Gynecology

## 2017-05-28 ENCOUNTER — Emergency Department
Admission: EM | Admit: 2017-05-28 | Discharge: 2017-05-28 | Disposition: A | Discharge status: Home / Self Care | Payer: Self-pay | Attending: Emergency Medicine | Admitting: Emergency Medicine

## 2017-05-28 ENCOUNTER — Encounter: Payer: Self-pay | Admitting: Emergency Medicine

## 2017-05-28 ENCOUNTER — Emergency Department: Payer: Self-pay

## 2017-05-28 DIAGNOSIS — J209 Acute bronchitis, unspecified: Secondary | ICD-10-CM | POA: Insufficient documentation

## 2017-05-28 DIAGNOSIS — F17210 Nicotine dependence, cigarettes, uncomplicated: Secondary | ICD-10-CM | POA: Insufficient documentation

## 2017-05-28 DIAGNOSIS — Z79899 Other long term (current) drug therapy: Secondary | ICD-10-CM | POA: Insufficient documentation

## 2017-05-28 MED ORDER — BENZONATATE 100 MG PO CAPS: 100.0000 mg | ORAL_CAPSULE | Freq: Four times a day (QID) | ORAL | 0 refills | Status: DC | PRN

## 2017-05-28 MED ORDER — BENZONATATE 100 MG PO CAPS
100.0000 mg | ORAL_CAPSULE | Freq: Once | ORAL | Status: AC
  Administered 2017-05-28: 100 mg via ORAL
  Filled 2017-05-28: qty 1

## 2017-05-28 MED ORDER — ALBUTEROL SULFATE HFA 108 (90 BASE) MCG/ACT IN AERS: 2.0000 | INHALATION_SPRAY | RESPIRATORY_TRACT | 0 refills | Status: DC | PRN

## 2017-05-28 MED ORDER — PSEUDOEPH-BROMPHEN-DM 30-2-10 MG/5ML PO SYRP: 10.0000 mL | ORAL_SOLUTION | Freq: Four times a day (QID) | ORAL | 0 refills | Status: DC | PRN

## 2017-05-28 MED ORDER — PREDNISONE 50 MG PO TABS: 50.0000 mg | ORAL_TABLET | Freq: Every day | ORAL | 0 refills | Status: DC

## 2017-05-28 MED ORDER — METHYLPREDNISOLONE SODIUM SUCC 125 MG IJ SOLR
125.0000 mg | Freq: Once | INTRAMUSCULAR | Status: AC
  Administered 2017-05-28: 125 mg via INTRAMUSCULAR
  Filled 2017-05-28: qty 2

## 2017-05-28 MED ORDER — ALBUTEROL SULFATE (2.5 MG/3ML) 0.083% IN NEBU
2.5000 mg | INHALATION_SOLUTION | Freq: Once | RESPIRATORY_TRACT | Status: AC
  Administered 2017-05-28: 2.5 mg via RESPIRATORY_TRACT
  Filled 2017-05-28: qty 3

## 2017-05-28 MED ORDER — AZITHROMYCIN 250 MG PO TABS: ORAL_TABLET | ORAL | 0 refills | Status: DC

## 2017-05-28 NOTE — ED Provider Notes (Signed)
[facility] Emergency Department Provider Note  ____________________________________________  Time seen: Approximately 5:08 PM  I have reviewed the triage vital signs and the nursing notes.   HISTORY  Chief Complaint Cough    HPI Brandy Robinson is a 52 y.o. female who presents the emergency department complaining of significant dry cough.  Patient reports that she has had symptoms that have been increasing over the past 2 to 3 weeks.  Patient denies any nasal congestion, sore throat, ear pressure, fevers or chills with this complaint.  No history of asthma, COPD, emphysema.  Patient does smoke.  Patient reports that the cough is dry.  Patient reports that from the coughing she is now experiencing aching in her ribs but no chest pain.  No difficulty breathing or shortness of breath.  Patient is tried over-the-counter medications with no relief.   Past Medical History:  Diagnosis Date  . Anxiety   . Bipolar disorder (HCC)   . Depression   . Disc disorder   . Dyspareunia   . Endometriosis   . GERD (gastroesophageal reflux disease)   . Insomnia   . Lichen   . Surgical menopause   . Vaginal atrophy   . Wears glasses     Patient Active Problem List   Diagnosis Date Noted  . SUI (stress urinary incontinence, female) 05/19/2017  . Generalized anxiety disorder 03/21/2017  . CAP (community acquired pneumonia) 03/18/2017  . Acute respiratory failure (HCC)   . Perforated viscus 12/30/2014  . Lichen sclerosus 09/24/2014  . Surgical menopause 09/24/2014  . Endometriosis 09/24/2014  . Bipolar 1 disorder (HCC) 09/24/2014  . Insomnia 09/24/2014  . GERD (gastroesophageal reflux disease) 09/24/2014  . History of stomach ulcers 09/24/2014    Past Surgical History:  Procedure Laterality Date  . ABDOMINAL HYSTERECTOMY  1995   tah-bso  . ARM WOUND REPAIR / CLOSURE  2013   cellulitis rt arm-i/d  . KNEE BURSECTOMY Left 09/24/2013   Procedure: IRRIGATION AND  DEBRIDEMENT OF LEFT KNEE ;  Surgeon: Sheral Apley, MD;  Location: [facility];  Service: Orthopedics;  Laterality: Left;  . LAPAROTOMY N/A 12/30/2014   Procedure: EXPLORATORY LAPAROTOMY-graham patch of peptic ulcer;  Surgeon: Ricarda Frame, MD;  Location: [facility] ORS;  Service: General;  Laterality: N/A;    Prior to Admission medications   Medication Sig Start Date End Date Taking? Authorizing Provider  albuterol (PROVENTIL HFA;VENTOLIN HFA) 108 (90 Base) MCG/ACT inhaler Inhale 2 puffs into the lungs every 4 (four) hours as needed for wheezing or shortness of breath. 05/28/17   Cuthriell, Delorise Royals, PA-C  azithromycin (ZITHROMAX Z-PAK) 250 MG tablet Take 2 tablets (500 mg) on  Day 1,  followed by 1 tablet (250 mg) once daily on Days 2 through 5. 05/28/17   Cuthriell, Delorise Royals, PA-C  benzonatate (TESSALON PERLES) 100 MG capsule Take 1 capsule (100 mg total) by mouth every 6 (six) hours as needed for cough. 05/28/17 05/28/18  Cuthriell, Delorise Royals, PA-C  bisacodyl (DULCOLAX) 5 MG EC tablet Take 1 tablet (5 mg total) by mouth daily as needed for moderate constipation. 03/25/17   Gouru, Deanna Artis, MD  brompheniramine-pseudoephedrine-DM 30-2-10 MG/5ML syrup Take 10 mLs by mouth 4 (four) times daily as needed. 05/28/17   Cuthriell, Delorise Royals, PA-C  buPROPion (WELLBUTRIN SR) 150 MG 12 hr tablet Take 150 mg by mouth daily.    [provider]  busPIRone (BUSPAR) 10 MG tablet Take 2 tablets (20 mg total) by mouth 2 (two)  times daily. 03/25/17   Ramonita Lab, MD  carvedilol (COREG) 12.5 MG tablet Take 1 tablet (12.5 mg total) by mouth 2 (two) times daily with a meal. 03/25/17   Gouru, Deanna Artis, MD  clonazePAM (KLONOPIN) 0.5 MG disintegrating tablet Take 1 tablet (0.5 mg total) by mouth at bedtime. 03/25/17   Ramonita Lab, MD  conjugated estrogens (PREMARIN) vaginal cream Place 0.25 Applicatorfuls vaginally 2 (two) times a week. 05/18/17   Defrancesco, Prentice Docker, MD  DULoxetine (CYMBALTA) 30 MG capsule  Take 1 capsule (30 mg total) by mouth 3 (three) times daily. 03/25/17   Gouru, Deanna Artis, MD  estradiol (ESTRACE) 1 MG tablet TAKE 1 1/2 (1.5MG ) BY MOUTH EVERY DAY 02/27/17   Defrancesco, Prentice Docker, MD  estradiol (ESTRACE) 1 MG tablet Take 1.5 tablets (1.5 mg total) by mouth daily. Alternate daily dose- /1mg /2mg /1mg  05/18/17   Defrancesco, Prentice Docker, MD  Fluticasone-Salmeterol (ADVAIR DISKUS) 100-50 MCG/DOSE AEPB Inhale 1 puff into the lungs 2 (two) times daily. 03/25/17 03/25/18  Ramonita Lab, MD  furosemide (LASIX) 40 MG tablet Take 1 tablet (40 mg total) by mouth 2 (two) times daily. 03/25/17 03/25/18  Ramonita Lab, MD  lisinopril (PRINIVIL,ZESTRIL) 2.5 MG tablet Take 1 tablet (2.5 mg total) by mouth daily. 03/26/17   Gouru, Deanna Artis, MD  pantoprazole (PROTONIX) 40 MG tablet Take 1 tablet (40 mg total) by mouth daily. 05/13/15   Loflin, Laney Potash, MD  potassium chloride (KLOR-CON) 20 MEQ packet Take 40 mEq by mouth daily. 03/26/17   Ramonita Lab, MD  predniSONE (DELTASONE) 50 MG tablet Take 1 tablet (50 mg total) by mouth daily with breakfast. 05/28/17   Cuthriell, Delorise Royals, PA-C  QUEtiapine (SEROQUEL XR) 200 MG 24 hr tablet Take 1 tablet (200 mg total) by mouth at bedtime. 03/25/17   Ramonita Lab, MD  traZODone (DESYREL) 100 MG tablet Take 1 tablet (100 mg total) by mouth at bedtime as needed for sleep. 03/25/17   Ramonita Lab, MD    Allergies Hydrocodone; Aspirin; and Morphine and related  Family History  Problem Relation Age of Onset  . Diabetes Mother   . Diabetes Sister   . Cancer Father        Throat  . Heart disease Neg Hx   . Breast cancer Neg Hx   . Ovarian cancer Neg Hx   . Colon cancer Neg Hx     Social History Social History   Tobacco Use  . Smoking status: Current Every Day Smoker    Packs/day: 1.00    Types: Cigarettes  . Smokeless tobacco: Never Used  Substance Use Topics  . Alcohol use: No  . Drug use: No     Review of Systems  Constitutional: No fever/chills Eyes: No visual  changes. No discharge ENT: No upper respiratory complaints. Cardiovascular: no chest pain. Respiratory: Positive for nonproductive cough. No SOB. Gastrointestinal: No abdominal pain.  No nausea, no vomiting.   Musculoskeletal: Negative for musculoskeletal pain. Skin: Negative for rash, abrasions, lacerations, ecchymosis. Neurological: Negative for headaches, focal weakness or numbness. 10-point ROS otherwise negative.  ____________________________________________   PHYSICAL EXAM:  VITAL SIGNS: ED Triage Vitals  Enc Vitals Group     BP 05/28/17 1657 (!) 147/71     Pulse Rate 05/28/17 1657 (!) 101     Resp 05/28/17 1657 20     Temp 05/28/17 1657 98.5 F (36.9 C)     Temp Source 05/28/17 1657 Oral     SpO2 05/28/17 1657 97 %     Weight  05/28/17 1658 160 lb (72.6 kg)     Height 05/28/17 1658  (1.6 m)     Head Circumference --      Peak Flow --      Pain Score --      Pain Loc --      Pain Edu? --      Excl. in GC? --      Constitutional: Alert and oriented. Well appearing and in no acute distress. Eyes: Conjunctivae are normal. PERRL. EOMI. Head: Atraumatic. ENT:      Ears: EACs and TMs unremarkable bilaterally.      Nose: No congestion/rhinnorhea.      Mouth/Throat: Mucous membranes are moist.  Pharynx is nonerythematous and nonedematous.  Uvula is midline. Neck: No stridor.  Neck is supple full range of motion Hematological/Lymphatic/Immunilogical: No cervical lymphadenopathy. Cardiovascular: Normal rate, regular rhythm. Normal S1 and S2.  Good peripheral circulation. Respiratory: Normal respiratory effort without tachypnea or retractions. Lungs with scattered expiratory wheezes with no rales rhonchi.Peri Jefferson air entry to the bases with no decreased or absent breath sounds. Musculoskeletal: Full range of motion to all extremities. No gross deformities appreciated. Neurologic:  Normal speech and language. No gross focal neurologic deficits are appreciated.  Skin:   Skin is warm, dry and intact. No rash noted. Psychiatric: Mood and affect are normal. Speech and behavior are normal. Patient exhibits appropriate insight and judgement.   ____________________________________________   LABS (all labs ordered are listed, but only abnormal results are displayed)  Labs Reviewed - No data to display ____________________________________________  EKG   ____________________________________________  RADIOLOGY Festus Barren Cuthriell, personally viewed and evaluated these images (plain radiographs) as part of my medical decision making, as well as reviewing the written report by the radiologist.  Dg Chest 2 View  Result Date: 05/28/2017 CLINICAL DATA:  Nonproductive cough for the past week. EXAM: CHEST - 2 VIEW COMPARISON:  PA and lateral chest 03/21/2017 and 10/17/2017. CT chest 10/17/2016 and 03/22/2017. FINDINGS: There is peribronchial thickening. No consolidative process, pneumothorax or effusion. Heart size is normal. No focal bony abnormality. IMPRESSION: Findings suggestive of bronchitis.  The exam is otherwise negative. Electronically Signed   By: Drusilla Kanner M.D.   On: 05/28/2017 17:29    ____________________________________________    PROCEDURES  Procedure(s) performed:    Procedures    Medications  albuterol (PROVENTIL) (2.5 MG/3ML) 0.083% nebulizer solution 2.5 mg (has no administration in time range)  methylPREDNISolone sodium succinate (SOLU-MEDROL) 125 mg/2 mL injection 125 mg (has no administration in time range)  benzonatate (TESSALON) capsule 100 mg (has no administration in time range)     ____________________________________________   INITIAL IMPRESSION / ASSESSMENT AND PLAN / ED COURSE  Pertinent labs & imaging results that were available during my care of the patient were reviewed by me and considered in my medical decision making (see chart for details).  Review of the [place] CSRS was performed in accordance of the  [facility] prior to dispensing any controlled drugs.     Patient's diagnosis is consistent with bronchitis.  Patient presents the emergency department with 2 to 3-week history of worsening nonproductive cough.  No associated upper respiratory symptoms.  No fevers or chills.  X-ray reveals findings consistent with bronchitis.  Patient is given Solu-Medrol, albuterol treatment in the emergency department.  Given the length of coughing, patient will be also covered with antibiotics at home.  Z-Pak, prednisone, albuterol inhaler, Bromfed, Tessalon Perles are also prescribed..  Patient will follow primary  care as needed patient is given ED precautions to return to the ED for any worsening or new symptoms.     ____________________________________________  FINAL CLINICAL IMPRESSION(S) / ED DIAGNOSES  Final diagnoses:  Acute bronchitis, unspecified organism      NEW MEDICATIONS STARTED DURING THIS VISIT:  ED Discharge Orders        Ordered    predniSONE (DELTASONE) 50 MG tablet  Daily with breakfast     05/28/17 1752    albuterol (PROVENTIL HFA;VENTOLIN HFA) 108 (90 Base) MCG/ACT inhaler  Every 4 hours PRN     05/28/17 1752    azithromycin (ZITHROMAX Z-PAK) 250 MG tablet     05/28/17 1752    brompheniramine-pseudoephedrine-DM 30-2-10 MG/5ML syrup  4 times daily PRN     05/28/17 1752    benzonatate (TESSALON PERLES) 100 MG capsule  Every 6 hours PRN     05/28/17 1752          This chart was dictated using voice recognition software/Dragon. Despite best efforts to proofread, errors can occur which can change the meaning. Any change was purely unintentional.    Racheal Patches, PA-C 05/28/17 1754    Phineas Semen, MD 05/28/17 2017

## 2017-05-28 NOTE — ED Triage Notes (Addendum)
Pt arrived with complaints of cough for the last two week. Pt states her cough is dry and has has no relief with OTC medication. Pt denies shortness of breath or chest pain.

## 2017-05-28 NOTE — ED Notes (Addendum)
See triage note  Presents with non prod cough for a few days. Denies any fever   States she is having some abd soreness and sore throat d/t cough   Afebrile on arrival

## 2017-06-15 ENCOUNTER — Encounter: Payer: Self-pay | Admitting: Obstetrics and Gynecology

## 2017-07-10 ENCOUNTER — Ambulatory Visit: Payer: Self-pay | Admitting: Pharmacist

## 2017-07-10 ENCOUNTER — Encounter: Payer: Self-pay | Admitting: Pharmacist

## 2017-07-10 ENCOUNTER — Other Ambulatory Visit: Payer: Self-pay

## 2017-07-10 DIAGNOSIS — Z79899 Other long term (current) drug therapy: Secondary | ICD-10-CM

## 2017-07-10 NOTE — Progress Notes (Signed)
[facility] Visit Note  Patient: Brandy Robinson MRN: [id number] Date of Birth: [birth year] PCP: Abram SanderAdamo, Elena M, MD   Brandy Robinson 52 y.o. female presents for her annual Medication Therapy Management visit today with the pharmacist. She voices no complaints today.  There were no vitals taken for this visit.  Patient Information   Past Medical History:  Diagnosis Date  . Anxiety   . Bipolar disorder (HCC)   . Depression   . Disc disorder   . Dyspareunia   . Endometriosis   . GERD (gastroesophageal reflux disease)   . Insomnia   . Lichen   . Surgical menopause   . Vaginal atrophy   . Wears glasses       Past Surgical History:  Procedure Laterality Date  . ABDOMINAL HYSTERECTOMY  1995   tah-bso  . ARM WOUND REPAIR / CLOSURE  2013   cellulitis rt arm-i/d  . KNEE BURSECTOMY Left 09/24/2013   Procedure: IRRIGATION AND DEBRIDEMENT OF LEFT KNEE ;  Surgeon: Sheral Apleyimothy D Murphy, MD;  Location: [facility];  Service: Orthopedics;  Laterality: Left;  . LAPAROTOMY N/A 12/30/2014   Procedure: EXPLORATORY LAPAROTOMY-graham patch of peptic ulcer;  Surgeon: Ricarda Frameharles Woodham, MD;  Location: [facility];  Service: General;  Laterality: N/A;     Family History  Problem Relation Age of Onset  . Diabetes Mother   . Diabetes Sister   . Cancer Father        Throat  . Heart disease Neg Hx   . Breast cancer Neg Hx   . Ovarian cancer Neg Hx   . Colon cancer Neg Hx     New Diagnoses (since last visit):   Family Support:     Current Exercise Habits: Home exercise routine, Type of exercise: Other - see comments(walking and cleaning throughout the day), Time (Minutes): 60, Frequency (Times/Week): 7, Weekly Exercise (Minutes/Week): 420, Intensity: Moderate  Exercise limited by: None identified    Social History   Substance and Sexual Activity  Alcohol Use No      Social History   Tobacco Use  Smoking Status Current Every Day Smoker  . Packs/day:  0.50  . Types: Cigarettes  Smokeless Tobacco Never Used  Tobacco Comment   1800quit now      Health Maintenance  Topic Date Due  . TETANUS/TDAP  09/01/1986  . PAP SMEAR  08/31/1988  . INFLUENZA VACCINE  08/17/2017  . HIV Screening  Completed     Outpatient Encounter Medications as of 07/10/2017  Medication Sig  . albuterol (PROVENTIL HFA;VENTOLIN HFA) 108 (90 Base) MCG/ACT inhaler Inhale 2 puffs into the lungs every 4 (four) hours as needed for wheezing or shortness of breath.  Marland Kitchen. atorvastatin (LIPITOR) 20 MG tablet Take 20 mg by mouth at bedtime.  Marland Kitchen. buPROPion (WELLBUTRIN SR) 150 MG 12 hr tablet Take 150 mg by mouth daily. At 6 am.  . busPIRone (BUSPAR) 10 MG tablet Take 2 tablets (20 mg total) by mouth 2 (two) times daily.  . carvedilol (COREG) 12.5 MG tablet Take 1 tablet (12.5 mg total) by mouth 2 (two) times daily with a meal. (Patient taking differently: Take 6.25 mg by mouth 2 (two) times daily with a meal. )  . clonazePAM (KLONOPIN) 0.5 MG disintegrating tablet Take 1 tablet (0.5 mg total) by mouth at bedtime.  . conjugated estrogens (PREMARIN) vaginal cream Place 0.25 Applicatorfuls vaginally 2 (two) times a week.  . DULoxetine (CYMBALTA) 30 MG capsule Take 1 capsule (30  mg total) by mouth 3 (three) times daily. (Patient taking differently: Take 30 mg by mouth 3 (three) times daily. 1 capsule in the morning and 2 in the evening)  . estradiol (ESTRACE) 1 MG tablet Take 1 mg by mouth daily. Alternating 1mg  daily with 2mg  daily.  . furosemide (LASIX) 40 MG tablet Take 1 tablet (40 mg total) by mouth 2 (two) times daily. (Patient taking differently: Take 40 mg by mouth 2 (two) times daily as needed. )  . lisinopril (PRINIVIL,ZESTRIL) 5 MG tablet Take 5 mg by mouth every evening.  . pantoprazole (PROTONIX) 40 MG tablet Take 1 tablet (40 mg total) by mouth daily.  . potassium chloride SA (K-DUR,KLOR-CON) 20 MEQ tablet Take 40 mEq by mouth daily.  . QUEtiapine (SEROQUEL) 100 MG tablet  Take 100 mg by mouth as needed.  Marland Kitchen QUEtiapine (SEROQUEL) 400 MG tablet Take 400 mg by mouth at bedtime.  Marland Kitchen tiZANidine (ZANAFLEX) 4 MG tablet Take 4 mg by mouth at bedtime as needed for muscle spasms.  . traZODone (DESYREL) 100 MG tablet Take 1 tablet (100 mg total) by mouth at bedtime as needed for sleep.  . [DISCONTINUED] estradiol (ESTRACE) 1 MG tablet TAKE 1 1/2 (1.5MG ) BY MOUTH EVERY DAY  . [DISCONTINUED] lisinopril (PRINIVIL,ZESTRIL) 2.5 MG tablet Take 1 tablet (2.5 mg total) by mouth daily.  . [DISCONTINUED] potassium chloride (KLOR-CON) 20 MEQ packet Take 40 mEq by mouth daily.  . [DISCONTINUED] azithromycin (ZITHROMAX Z-PAK) 250 MG tablet Take 2 tablets (500 mg) on  Day 1,  followed by 1 tablet (250 mg) once daily on Days 2 through 5.  . [DISCONTINUED] benzonatate (TESSALON PERLES) 100 MG capsule Take 1 capsule (100 mg total) by mouth every 6 (six) hours as needed for cough.  . [DISCONTINUED] bisacodyl (DULCOLAX) 5 MG EC tablet Take 1 tablet (5 mg total) by mouth daily as needed for moderate constipation.  . [DISCONTINUED] brompheniramine-pseudoephedrine-DM 30-2-10 MG/5ML syrup Take 10 mLs by mouth 4 (four) times daily as needed.  . [DISCONTINUED] estradiol (ESTRACE) 1 MG tablet Take 1.5 tablets (1.5 mg total) by mouth daily. Alternate daily dose- 2mg /1mg /2mg /1mg  (Patient taking differently: Take 1 mg by mouth as directed. Alternate daily dose- 2mg /1mg /2mg /1mg )  . [DISCONTINUED] Fluticasone-Salmeterol (ADVAIR DISKUS) 100-50 MCG/DOSE AEPB Inhale 1 puff into the lungs 2 (two) times daily.  . [DISCONTINUED] predniSONE (DELTASONE) 50 MG tablet Take 1 tablet (50 mg total) by mouth daily with breakfast.  . [DISCONTINUED] QUEtiapine (SEROQUEL XR) 200 MG 24 hr tablet Take 1 tablet (200 mg total) by mouth at bedtime.   No facility-administered encounter medications on file as of 07/10/2017.     Assessment and Plan:  1. Compliance: Takes medications as prescribed. She has been out of the Advair,  clobetasol and Premarin cream.  As of April 2019, patient has been re-certified for assistance with [facility].  2. Depression/Anxiety: Currently on quetiapine 400 mg at bedtime, quetiapine 100 mg as needed, bupropion SR 150 mg daily, clonazepam at bedtime, trazodone 100 mg at bedtime as needed, duloxetine 30 mg in the morning and 60 mg in the evening and buspirone 20 mg twice daily. She is experiencing and increase in mood swings. Follow appointment is scheduled for July 3rd with her provider.  3. COPD: Patient states this was diagnosed during her hospitalization in March 2019. States she had "double pneumonia". She is currently using Proventil as needed. [facility] started the patient assistance process for the Advair. Dispensed the generic Wixela today to use until Advair available.  4. Lichen sclerosus/Vaginal Atrophy/Surgical Menopause: Currently on estradiol 1 mg daily alternating with 2 mg's daily. [facility] restarted the patient assistance for the Premarin cream. Pharmacist to contact Dr. Greggory Keen for the clobetasol prescription.  5. CHF: Currently on lisinopril 5 mg daily, carvedilol 6.41m twice daily along with furosemide 40 mg twice daily as needed and potassium 40 meq daily . Patient states the carvedilol dose was decreased recently. She has developed a dry cough. If the cough persists, she will contact Dr. Richarda Blade to discuss the continued use of lisinopril, may benefit from an ARB.  6. Cholesterol: Currently on atorvastatin 20 mg daily. Could not find a recent lipid panel.  PLAN: Follow up in 6 months.  Rusti Arizmendi K. Joelene Millin, PharmD [facility]-Pharmacy Operations Coordinator [phone]

## 2017-07-10 NOTE — Patient Instructions (Addendum)
Call Dr. Richarda BladeAdamo if your dry cough persists, this could be from the lisinopril.  Pharmacist to contact your doctor about the clobetasol  Patient assistance forms started for Premarin and Advair

## 2017-07-17 ENCOUNTER — Telehealth: Payer: Self-pay | Admitting: Obstetrics and Gynecology

## 2017-07-17 MED ORDER — FLUCONAZOLE 150 MG PO TABS: 150.0000 mg | ORAL_TABLET | Freq: Every day | ORAL | 0 refills | Status: DC

## 2017-07-17 NOTE — Telephone Encounter (Signed)
The patient left a VM at 9:21 today requesting some medication for a recurrent yeast infection to be called into [facility].  A good call back number for this patient is [phone], please advise, thanks.

## 2017-07-17 NOTE — Telephone Encounter (Signed)
Pt states she has a yellowish d/c x several days. NO odor. Pos itch- mild. No new detergents. Pos new body wash. No new sex partners. Pt aware to use dove sensitive in vaginal area. Will erx diflucan. If no better in 7 days to make an appt to be seen.

## 2017-12-11 ENCOUNTER — Telehealth: Payer: Self-pay | Admitting: Obstetrics and Gynecology

## 2017-12-11 MED ORDER — FLUCONAZOLE 150 MG PO TABS: 150.0000 mg | ORAL_TABLET | Freq: Every day | ORAL | 0 refills | Status: DC

## 2017-12-11 MED ORDER — ESTRADIOL 1 MG PO TABS: 1.0000 mg | ORAL_TABLET | Freq: Every day | ORAL | 6 refills | Status: DC

## 2017-12-11 NOTE — Telephone Encounter (Signed)
Spoke with patient and she has vaginal discharge, odor, and vaginal itching. Patient believes that symptoms are coming from her body wash. I have sent in medication and advised to call back to schedule if symptoms do not get better. Refilled Estradiol.

## 2017-12-11 NOTE — Telephone Encounter (Signed)
The patient called and stated that she needs a medication refill of her Estradiol and a medication for a yeast infection. The patient would also like a call back, Please advise.

## 2018-01-25 ENCOUNTER — Encounter: Payer: Self-pay | Admitting: Pharmacist

## 2018-01-30 ENCOUNTER — Telehealth: Payer: Self-pay | Admitting: Obstetrics and Gynecology

## 2018-01-30 NOTE — Telephone Encounter (Signed)
Per Dr. Logan Bores patient needs to come in. I have scheduled an appointment for next week.

## 2018-01-30 NOTE — Telephone Encounter (Signed)
The patient is asking if she can get some more cream for her lichens, and she thinks it has also caused a yeast infection, and she also stated that for the yeast she needs the pill.  Please advise, thanks.

## 2018-02-08 ENCOUNTER — Ambulatory Visit: Payer: Self-pay | Admitting: Obstetrics and Gynecology

## 2018-02-15 ENCOUNTER — Encounter: Payer: Self-pay | Admitting: Pharmacist

## 2018-03-12 ENCOUNTER — Telehealth: Payer: Self-pay | Admitting: Surgical

## 2018-03-12 NOTE — Telephone Encounter (Signed)
[facility] pharmacy called with questions about prescriptions. Patient is currently prescribed Premarin and Estradiol. Pharmacy wants to know if the patient should continue both medications.

## 2018-03-18 ENCOUNTER — Emergency Department: Payer: Self-pay

## 2018-03-18 ENCOUNTER — Other Ambulatory Visit: Payer: Self-pay

## 2018-03-18 ENCOUNTER — Emergency Department
Admission: EM | Admit: 2018-03-18 | Discharge: 2018-03-18 | Disposition: A | Discharge status: Home / Self Care | Payer: Self-pay | Attending: Emergency Medicine | Admitting: Emergency Medicine

## 2018-03-18 DIAGNOSIS — F17210 Nicotine dependence, cigarettes, uncomplicated: Secondary | ICD-10-CM | POA: Insufficient documentation

## 2018-03-18 DIAGNOSIS — J40 Bronchitis, not specified as acute or chronic: Secondary | ICD-10-CM | POA: Insufficient documentation

## 2018-03-18 LAB — TROPONIN I: Troponin I: <0.03 ng/mL (ref <0.03)

## 2018-03-18 LAB — BASIC METABOLIC PANEL
Anion gap: 9 (ref 5–15)
BUN: 11 mg/dL (ref 6–20)
CO2: 25 mmol/L (ref 22–32)
Calcium: 8.7 mg/dL — ABNORMAL LOW (ref 8.9–10.3)
Chloride: 102 mmol/L (ref 98–111)
Creatinine, Ser: 0.65 mg/dL (ref 0.44–1.00)
GFR calc Af Amer: >60 mL/min (ref >60)
GLUCOSE: 174 mg/dL — AB (ref 70–99)
POTASSIUM: 3.6 mmol/L (ref 3.5–5.1)
Sodium: 136 mmol/L (ref 135–145)

## 2018-03-18 LAB — CBC
HEMATOCRIT: 35.7 % — AB (ref 36.0–46.0)
HEMOGLOBIN: 11.8 g/dL — AB (ref 12.0–15.0)
MCH: 31.5 pg (ref 26.0–34.0)
MCHC: 33.1 g/dL (ref 30.0–36.0)
MCV: 95.2 fL (ref 80.0–100.0)
Platelets: 268 10*3/uL (ref 150–400)
RBC: 3.75 MIL/uL — ABNORMAL LOW (ref 3.87–5.11)
RDW: 13.5 % (ref 11.5–15.5)
WBC: 8.5 10*3/uL (ref 4.0–10.5)
nRBC: 0 % (ref 0.0–0.2)

## 2018-03-18 MED ORDER — PREDNISONE 50 MG PO TABS: ORAL_TABLET | ORAL | 0 refills | Status: DC

## 2018-03-18 MED ORDER — AZITHROMYCIN 250 MG PO TABS: ORAL_TABLET | ORAL | 0 refills | Status: DC

## 2018-03-18 MED ORDER — ALBUTEROL SULFATE HFA 108 (90 BASE) MCG/ACT IN AERS: 2.0000 | INHALATION_SPRAY | Freq: Four times a day (QID) | RESPIRATORY_TRACT | 2 refills | Status: DC | PRN

## 2018-03-18 MED ORDER — SODIUM CHLORIDE 0.9% FLUSH: 3.0000 mL | Freq: Once | INTRAVENOUS | Status: DC

## 2018-03-18 NOTE — ED Provider Notes (Signed)
[facility] Emergency Department Provider Note       Time seen: ----------------------------------------- 6:10 PM on 03/18/2018 -----------------------------------------   I have reviewed the triage vital signs and the nursing notes.  HISTORY   Chief Complaint Shortness of Breath    HPI Brandy Robinson is a 53 y.o. female with a history of anxiety, bipolar disorder, depression, GERD who presents to the ED for shortness of breath started about a week ago.  Patient states she has had a productive cough.  She states some chest pain, headache, neck pain and weakness.  Past Medical History:  Diagnosis Date  . Anxiety   . Bipolar disorder (HCC)   . Depression   . Disc disorder   . Dyspareunia   . Endometriosis   . GERD (gastroesophageal reflux disease)   . Insomnia   . Lichen   . Surgical menopause   . Vaginal atrophy   . Wears glasses     Patient Active Problem List   Diagnosis Date Noted  . SUI (stress urinary incontinence, female) 05/19/2017  . Generalized anxiety disorder 03/21/2017  . CAP (community acquired pneumonia) 03/18/2017  . Acute respiratory failure (HCC)   . Perforated viscus 12/30/2014  . Lichen sclerosus 09/24/2014  . Surgical menopause 09/24/2014  . Endometriosis 09/24/2014  . Bipolar 1 disorder (HCC) 09/24/2014  . Insomnia 09/24/2014  . GERD (gastroesophageal reflux disease) 09/24/2014  . History of stomach ulcers 09/24/2014    Past Surgical History:  Procedure Laterality Date  . ABDOMINAL HYSTERECTOMY  1995   tah-bso  . ARM WOUND REPAIR / CLOSURE  2013   cellulitis rt arm-i/d  . KNEE BURSECTOMY Left 09/24/2013   Procedure: IRRIGATION AND DEBRIDEMENT OF LEFT KNEE ;  Surgeon: Sheral Apley, MD;  Location: [facility];  Service: Orthopedics;  Laterality: Left;  . LAPAROTOMY N/A 12/30/2014   Procedure: EXPLORATORY LAPAROTOMY-graham patch of peptic ulcer;  Surgeon: Ricarda Frame, MD;  Location: [facility];   Service: General;  Laterality: N/A;    Allergies Hydrocodone; Aspirin; and Morphine and related  Social History Social History   Tobacco Use  . Smoking status: Current Every Day Smoker    Packs/day: 0.50    Types: Cigarettes  . Smokeless tobacco: Never Used  . Tobacco comment: 1800quit now  Substance Use Topics  . Alcohol use: No  . Drug use: No   Review of Systems Constitutional: Negative for fever. Cardiovascular: Negative for chest pain. Respiratory: Positive for shortness of breath and cough Gastrointestinal: Negative for abdominal pain, vomiting and diarrhea. Musculoskeletal: Negative for back pain. Skin: Negative for rash. Neurological: Positive for headache and weakness  All systems negative/normal/unremarkable except as stated in the HPI  ____________________________________________   PHYSICAL EXAM:  VITAL SIGNS: ED Triage Vitals  Enc Vitals Group     BP 03/18/18 1543 140/69     Pulse Rate 03/18/18 1543 (!) 104     Resp 03/18/18 1543 16     Temp 03/18/18 1543 98 F (36.7 C)     Temp Source 03/18/18 1543 Oral     SpO2 03/18/18 1543 97 %     Weight 03/18/18 1544 169 lb (76.7 kg)     Height 03/18/18 1544 5\' 3"  (1.6 m)     Head Circumference --      Peak Flow --      Pain Score 03/18/18 1551 7     Pain Loc --      Pain Edu? --  Excl. in GC? --    Constitutional: Alert and oriented. Well appearing and in no distress. Eyes: Conjunctivae are normal. Normal extraocular movements. ENT      Head: Normocephalic and atraumatic.      Nose: No congestion/rhinnorhea.      Mouth/Throat: Mucous membranes are moist.      Neck: No stridor. Cardiovascular: Normal rate, regular rhythm. No murmurs, rubs, or gallops. Respiratory: Normal respiratory effort without tachypnea nor retractions. Breath sounds are clear and equal bilaterally. No wheezes/rales/rhonchi. Gastrointestinal: Soft and nontender. Normal bowel sounds Musculoskeletal: Nontender with normal range  of motion in extremities. No lower extremity tenderness nor edema. Neurologic:  Normal speech and language. No gross focal neurologic deficits are appreciated.  Skin:  Skin is warm, dry and intact. No rash noted. Psychiatric: Mood and affect are normal. Speech and behavior are normal.  ____________________________________________  ED COURSE:  As part of my medical decision making, I reviewed the following data within the electronic MEDICAL RECORD NUMBER History obtained from family if available, nursing notes, old chart and ekg, as well as notes from prior ED visits. Patient presented for viral symptoms, we will assess with labs and imaging as indicated at this time.   Procedures ____________________________________________   LABS (pertinent positives/negatives)  Labs Reviewed  BASIC METABOLIC PANEL - Abnormal; Notable for the following components:      Result Value   Glucose, Bld 174 (*)    Calcium 8.7 (*)    All other components within normal limits  CBC - Abnormal; Notable for the following components:   RBC 3.75 (*)    Hemoglobin 11.8 (*)    HCT 35.7 (*)    All other components within normal limits  TROPONIN I    RADIOLOGY Images were viewed by me Chest x-ray IMPRESSION: No active cardiopulmonary disease.  Emphysema (ICD10-J43.9). ____________________________________________   DIFFERENTIAL DIAGNOSIS   URI, pneumonia, influenza, bronchitis  FINAL ASSESSMENT AND PLAN  Bronchitis   Plan: The patient had presented for cough and congestion. Patient's labs did not reveal any acute process. Patient's imaging was reassuring.  Patient will be treated with albuterol, Z-Pak Intestinex.  She is cleared for outpatient follow-up.   Ulice Dash, MD    Note: This note was generated in part or whole with voice recognition software. Voice recognition is usually quite accurate but there are transcription errors that can and very often do occur. I apologize for any  typographical errors that were not detected and corrected.     Emily Filbert, MD 03/18/18 [phone]

## 2018-03-18 NOTE — ED Triage Notes (Signed)
Pt comes via POV from home with c/o SOB that started about a week ago. Pt states productive cough.   Pt states some chest pain, headache, neck pain and weakness.

## 2018-03-18 NOTE — ED Notes (Signed)
Pt ambulatory from waiting room and in NAD

## 2018-03-18 NOTE — ED Notes (Signed)
Pt stating she has had a cough and congestion for a week. Pt stating she has not had fevers, but is concerned for pneumonia. Pt is in NAD at this time and pulse O2 98% on RA

## 2018-03-28 ENCOUNTER — Telehealth: Payer: Self-pay | Admitting: Obstetrics and Gynecology

## 2018-03-28 NOTE — Telephone Encounter (Signed)
LM for patient to call back to schedule appointment to get medication. Per Dr. Logan Bores previous message patient needs to be seen. She canceled last appointment for this.

## 2018-03-28 NOTE — Telephone Encounter (Signed)
The patient is asking for some medicine for a yeast infection to be sent to the pharmacy in her file.  She has her annual exam in May, but she states she can't wait until then.  Please advise, thanks.

## 2018-03-28 NOTE — Telephone Encounter (Signed)
Spoke with Baxter Hire and she is going to hold off on ordering patients medication until she hears from me or the patient. I spoke with the patient and she does want to continue to take medications. I have scheduled her to come in May for her annual exam.

## 2018-03-28 NOTE — Telephone Encounter (Signed)
Baxter Hire called to do a follow up on the initial call on 03/12/18 as she states she needs to know if the patient needs to continue the Premarin and Estradiol.  She can be reached at [phone], please advise, thanks.

## 2018-04-04 ENCOUNTER — Telehealth: Payer: Self-pay | Admitting: Pharmacist

## 2018-04-04 NOTE — Telephone Encounter (Signed)
Left message for patient to return call here at #[phone] to give the option to perform MTM over phone in light of the recent COVID situation.   Mauri Reading, PharmD Pharmacy Resident  04/04/2018 3:27 PM

## 2018-04-05 ENCOUNTER — Ambulatory Visit: Payer: Self-pay | Admitting: Pharmacist

## 2018-04-05 ENCOUNTER — Other Ambulatory Visit: Payer: Self-pay

## 2018-04-05 DIAGNOSIS — Z79899 Other long term (current) drug therapy: Secondary | ICD-10-CM

## 2018-04-05 NOTE — Progress Notes (Signed)
  Medication Management Clinic Visit Note  Patient: Brandy Robinson MRN: [id number] Date of Birth: 1965-05-05 PCP: Abram Sander, MD   Brandy Robinson 53 y.o. female presents for a Medication Management visit today.  There were no vitals taken for this visit.  Patient Information   Past Medical History:  Diagnosis Date  . Anxiety   . Bipolar disorder (HCC)   . Depression   . Disc disorder   . Dyspareunia   . Endometriosis   . GERD (gastroesophageal reflux disease)   . Insomnia   . Lichen   . Surgical menopause   . Vaginal atrophy   . Wears glasses       Past Surgical History:  Procedure Laterality Date  . ABDOMINAL HYSTERECTOMY  1995   tah-bso  . ARM WOUND REPAIR / CLOSURE  2013   cellulitis rt arm-i/d  . KNEE BURSECTOMY Left 09/24/2013   Procedure: IRRIGATION AND DEBRIDEMENT OF LEFT KNEE ;  Surgeon: Sheral Apley, MD;  Location: [facility];  Service: Orthopedics;  Laterality: Left;  . LAPAROTOMY N/A 12/30/2014   Procedure: EXPLORATORY LAPAROTOMY-graham patch of peptic ulcer;  Surgeon: Ricarda Frame, MD;  Location: [facility];  Service: General;  Laterality: N/A;     Family History  Problem Relation Age of Onset  . Diabetes Mother   . Diabetes Sister   . Cancer Father        Throat  . Heart disease Neg Hx   . Breast cancer Neg Hx   . Ovarian cancer Neg Hx   . Colon cancer Neg Hx             Social History   Substance and Sexual Activity  Alcohol Use No      Social History   Tobacco Use  Smoking Status Current Every Day Smoker  . Packs/day: 0.50  . Types: Cigarettes  Smokeless Tobacco Never Used  Tobacco Comment   1800quit now      Health Maintenance  Topic Date Due  . TETANUS/TDAP  09/01/1986  . PAP SMEAR-Modifier  08/31/1988  . INFLUENZA VACCINE  08/17/2017  . MAMMOGRAM  08/31/2017  . COLONOSCOPY  08/31/2017  . HIV Screening  Completed     Assessment and Plan: 1. CHF (carvedilol, lisinopril, lasix): patient is  not taking any of these medications as she believed they were related to pneumonia. Stated what each medication was for and importance of taking these with HF. Patient stated she understood.  2. Hyperlipidemia (atorvastatin): not taking medications. Expressed importance of.  3. Depression/Anxiety (Quetiapine, duloxetine, bupropion, buspirone): no current issues.  4. Smoking Cessation: not interested in quitting at this time.   Needs Ondansetron/fluconazole refill. No other issues at this time. MTM limited by telephone interview.   Mauri Reading, PharmD Pharmacy Resident  04/05/2018 3:21 PM

## 2018-04-09 ENCOUNTER — Other Ambulatory Visit: Payer: Self-pay

## 2018-04-19 ENCOUNTER — Telehealth: Payer: Self-pay | Admitting: Pharmacist

## 2018-04-19 NOTE — Telephone Encounter (Signed)
04/19/2018 2:07:31 PM - Premarin script to dr for refill  04/19/2018 I called [facility] spoke with Deon for refill on Premarin, patient has reached max, will need a script--printed script and mailing to Dr. Greggory Keen @ [facility] to sign and return by interoffice or mail-Patient enrollment till 07/30/2018 Patient ID# [id number].Brandy Robinson

## 2018-04-26 ENCOUNTER — Telehealth: Payer: Self-pay | Admitting: Pharmacist

## 2018-04-26 NOTE — Telephone Encounter (Signed)
04/26/2018 11:19:17 AM - Premarin Vaginal cream refill  04/26/2018 Faxed script to [facility] to refill Premarin Vaginal cream 0.625mg  Place 0.25 applicator full vaginally 2 times a week.Forde Radon

## 2018-05-22 ENCOUNTER — Encounter: Payer: Self-pay | Admitting: Obstetrics and Gynecology

## 2018-07-17 ENCOUNTER — Encounter: Payer: Self-pay | Admitting: Obstetrics and Gynecology

## 2018-07-31 ENCOUNTER — Ambulatory Visit: Payer: Self-pay | Admitting: Obstetrics and Gynecology

## 2018-07-31 ENCOUNTER — Encounter: Payer: Self-pay | Admitting: Obstetrics and Gynecology

## 2018-07-31 ENCOUNTER — Other Ambulatory Visit: Payer: Self-pay

## 2018-07-31 VITALS — BP 118/68 | HR 72 | Ht 63.0 in | Wt 180.9 lb

## 2018-07-31 DIAGNOSIS — L900 Lichen sclerosus et atrophicus: Secondary | ICD-10-CM

## 2018-07-31 DIAGNOSIS — Z01419 Encounter for gynecological examination (general) (routine) without abnormal findings: Secondary | ICD-10-CM

## 2018-07-31 DIAGNOSIS — N393 Stress incontinence (female) (male): Secondary | ICD-10-CM

## 2018-07-31 MED ORDER — ESTRADIOL 1 MG PO TABS: 1.0000 mg | ORAL_TABLET | Freq: Every day | ORAL | 3 refills | Status: DC

## 2018-07-31 MED ORDER — CLOBETASOL PROPIONATE 0.05 % EX CREA: 1.0000 "application " | TOPICAL_CREAM | Freq: Two times a day (BID) | CUTANEOUS | 2 refills | Status: AC

## 2018-07-31 NOTE — Progress Notes (Signed)
Patient comes in today for a yearly physical. She is due for labs and mammogram. She is having some vaginal discharge. Patient states that she has not had sex in over a year.

## 2018-07-31 NOTE — Addendum Note (Signed)
Addended by: Durwin Glaze on: 07/31/2018 08:52 AM   Modules accepted: Orders

## 2018-07-31 NOTE — Progress Notes (Signed)
HPI:      [name] is a 53 y.o. G1P1001 who LMP was No LMP recorded. Patient has had a hysterectomy.  Subjective:   She presents today for her annual examination.  She continues to experience stress urinary incontinence symptoms.  She has not decided on any management at this time.  She is using estrogen daily as directed would like to change her dose to 1 mg/day instead of trying to alternate days as previously prescribed.  She has not discontinued smoking.  She has stopped using her medication for lichen sclerosus and she complains of some vulvar irritation.  She is not sure if this irritation is from using pads or from the lichen sclerosus.    Hx: The following portions of the patient's history were reviewed and updated as appropriate:             She  has a past medical history of Anxiety, Bipolar disorder (HCC), CHF (congestive heart failure) (HCC), Depression, Disc disorder, Dyspareunia, Endometriosis, GERD (gastroesophageal reflux disease), Insomnia, Lichen, Surgical menopause, Vaginal atrophy, and Wears glasses. She does not have any pertinent problems on file. She  has a past surgical history that includes Abdominal hysterectomy (1995); Arm wound repair / closure (2013); Knee bursectomy (Left, 09/24/2013); and laparotomy (N/A, 12/30/2014). Her family history includes Cancer in her father; Diabetes in her mother and sister. She  reports that she has been smoking cigarettes. She has been smoking about 1.00 pack per day. She has never used smokeless tobacco. She reports that she does not drink alcohol or use drugs. She has a current medication list which includes the following prescription(s): bupropion, buspirone, clonazepam, duloxetine, quetiapine, albuterol, atorvastatin, carvedilol, clobetasol cream, conjugated estrogens, estradiol, fluconazole, furosemide, lisinopril, pantoprazole, potassium chloride sa, tizanidine, and trazodone. She is allergic to hydrocodone; aspirin; and  morphine and related.       Review of Systems:  Review of Systems  Constitutional: Denied constitutional symptoms, night sweats, recent illness, fatigue, fever, insomnia and weight loss.  Eyes: Denied eye symptoms, eye pain, photophobia, vision change and visual disturbance.  Ears/Nose/Throat/Neck: Denied ear, nose, throat or neck symptoms, hearing loss, nasal discharge, sinus congestion and sore throat.  Cardiovascular: Denied cardiovascular symptoms, arrhythmia, chest pain/pressure, edema, exercise intolerance, orthopnea and palpitations.  Respiratory: Denied pulmonary symptoms, asthma, pleuritic pain, productive sputum, cough, dyspnea and wheezing.  Gastrointestinal: Denied, gastro-esophageal reflux, melena, nausea and vomiting.  Genitourinary:  See HPI regarding stress urinary incontinence lichen sclerosus.  Musculoskeletal: Denied musculoskeletal symptoms, stiffness, swelling, muscle weakness and myalgia.  Dermatologic: Denied dermatology symptoms, rash and scar.  Neurologic: Denied neurology symptoms, dizziness, headache, neck pain and syncope.  Psychiatric: Denied psychiatric symptoms, anxiety and depression.  Endocrine: Denied endocrine symptoms including hot flashes and night sweats.   Meds:   Current Outpatient Medications on File Prior to Visit  Medication Sig Dispense Refill  . buPROPion (WELLBUTRIN SR) 150 MG 12 hr tablet Take 150 mg by mouth daily. At 6 am.    . busPIRone (BUSPAR) 10 MG tablet Take 2 tablets (20 mg total) by mouth 2 (two) times daily. 60 tablet 0  . clonazePAM (KLONOPIN) 0.5 MG disintegrating tablet Take 1 tablet (0.5 mg total) by mouth at bedtime. 30 tablet 0  . DULoxetine (CYMBALTA) 30 MG capsule Take 1 capsule (30 mg total) by mouth 3 (three) times daily. (Patient taking differently: Take 30 mg by mouth 3 (three) times daily. 1 capsule in the morning and 2 in the evening) 90 capsule 0  .  QUEtiapine (SEROQUEL) 400 MG tablet Take 400 mg by mouth at bedtime.     Marland Kitchen albuterol (PROVENTIL HFA;VENTOLIN HFA) 108 (90 Base) MCG/ACT inhaler Inhale 2 puffs into the lungs every 6 (six) hours as needed for wheezing or shortness of breath. (Patient not taking: Reported on 07/31/2018) 1 Inhaler 2  . atorvastatin (LIPITOR) 20 MG tablet Take 20 mg by mouth at bedtime.    . carvedilol (COREG) 12.5 MG tablet Take 1 tablet (12.5 mg total) by mouth 2 (two) times daily with a meal. (Patient not taking: Reported on 04/05/2018) 60 tablet 0  . conjugated estrogens (PREMARIN) vaginal cream Place 5.28 Applicatorfuls vaginally 2 (two) times a week. (Patient not taking: Reported on 07/31/2018) 42.5 g 12  . fluconazole (DIFLUCAN) 150 MG tablet Take 1 tablet (150 mg total) by mouth daily. (Patient not taking: Reported on 04/05/2018) 1 tablet 0  . furosemide (LASIX) 40 MG tablet Take 1 tablet (40 mg total) by mouth 2 (two) times daily. (Patient taking differently: Take 40 mg by mouth 2 (two) times daily as needed. ) 60 tablet 1  . lisinopril (PRINIVIL,ZESTRIL) 5 MG tablet Take 5 mg by mouth every evening.    . pantoprazole (PROTONIX) 40 MG tablet Take 1 tablet (40 mg total) by mouth daily. (Patient not taking: Reported on 07/31/2018) 60 tablet 0  . potassium chloride SA (K-DUR,KLOR-CON) 20 MEQ tablet Take 40 mEq by mouth daily.    Marland Kitchen tiZANidine (ZANAFLEX) 4 MG tablet Take 4 mg by mouth at bedtime as needed for muscle spasms.    . traZODone (DESYREL) 100 MG tablet Take 1 tablet (100 mg total) by mouth at bedtime as needed for sleep. (Patient not taking: Reported on 04/05/2018) 30 tablet 0   No current facility-administered medications on file prior to visit.     Objective:     Vitals:   07/31/18 0810  BP: 118/68  Pulse: 72              Physical examination General NAD, Conversant  HEENT Atraumatic; Op clear with mmm.  Normo-cephalic. Pupils reactive. Anicteric sclerae  Thyroid/Neck Smooth without nodularity or enlargement. Normal ROM.  Neck Supple.  Skin No rashes, lesions or  ulceration. Normal palpated skin turgor. No nodularity.  Breasts: No masses or discharge.  Symmetric.  No axillary adenopathy.  Lungs: Clear to auscultation.No rales or wheezes. Normal Respiratory effort, no retractions.  Heart: NSR.  No murmurs or rubs appreciated. No periferal edema  Abdomen: Soft.  Non-tender.  No masses.  No HSM. No hernia  Extremities: Moves all appropriately.  Normal ROM for age. No lymphadenopathy.  Neuro: Oriented to PPT.  Normal mood. Normal affect.     Pelvic:   Vulva: Normal appearance.  No lesions.  Mild to moderate lichen sclerosis present  Vagina: No lesions or abnormalities noted.  Moderate vaginal atrophy  Support: Normal pelvic support.  Excellent, in fact remarkable, vaginal support.  Urethra No masses tenderness or scarring.  Meatus Normal size without lesions or prolapse.  Cervix: Surgically absent   Anus: Normal exam.  No lesions.  Perineum: Normal exam.  No lesions.        Bimanual   Uterus: Surgically absent   Adnexae: No masses.  Non-tender to palpation.  Cul-de-sac: Negative for abnormality.     Assessment:    G1P1001 Patient Active Problem List   Diagnosis Date Noted  . SUI (stress urinary incontinence, female) 05/19/2017  . Generalized anxiety disorder 03/21/2017  . CAP (community acquired pneumonia) 03/18/2017  .  Acute respiratory failure (HCC)   . Perforated viscus 12/30/2014  . Lichen sclerosus 09/24/2014  . Surgical menopause 09/24/2014  . Endometriosis 09/24/2014  . Bipolar 1 disorder (HCC) 09/24/2014  . Insomnia 09/24/2014  . GERD (gastroesophageal reflux disease) 09/24/2014  . History of stomach ulcers 09/24/2014     1. Well woman exam with routine gynecological exam   2. Lichen sclerosus   3. SUI (stress urinary incontinence, female)        Plan:            1.  Basic Screening Recommendations The basic screening recommendations for asymptomatic women were discussed with the patient during her visit.  The  age-appropriate recommendations were discussed with her and the rational for the tests reviewed.  When I am informed by the patient that another primary care physician has previously obtained the age-appropriate tests and they are up-to-date, only outstanding tests are ordered and referrals given as necessary.  Abnormal results of tests will be discussed with her when all of her results are completed. Labs ordered-mammogram ordered 2.  We will change ERT to 1 mg daily at patient request 3.  We have discussed rest urinary incontinence and patient will inform us if she would like to consider further management with a sling procedure.  I am somewhat hesitant about this because of her excellent vaginal support. 4.  Advised her to discontinue tobacco use. 5.  Patient to resume use of clobetasol as directed for lichen sclerosus. Orders No orders of the defined types were placed in this encounter.    Meds ordered this encounter  Medications  . estradiol (ESTRACE) 1 MG tablet    Sig: Take 1 tablet (1 mg total) by mouth daily.    Dispense:  90 tablet    Refill:  3  . clobetasol cream (TEMOVATE) 0.05 %    Sig: Apply 1 application topically 2 (two) times daily. Use for 30 days twice a day as directed then use daily as directed.    Dispense:  60 g    Refill:  2        F/U  Return in about 1 year (around 07/31/2019) for Annual Physical.  Elonda Huskyavid J. Darly Massi, M.D. 07/31/2018 8:44 AM

## 2018-08-01 LAB — LIPID PANEL
Chol/HDL Ratio: 5.5 ratio — ABNORMAL HIGH (ref 0.0–4.4)
Cholesterol, Total: 230 mg/dL — ABNORMAL HIGH (ref 100–199)
HDL: 42 mg/dL (ref >39)
LDL Calculated: 135 mg/dL — ABNORMAL HIGH (ref 0–99)
Triglycerides: 265 mg/dL — ABNORMAL HIGH (ref 0–149)
VLDL Cholesterol Cal: 53 mg/dL — ABNORMAL HIGH (ref 5–40)

## 2018-08-01 LAB — TSH: TSH: 3.13 u[IU]/mL (ref 0.450–4.500)

## 2018-08-01 LAB — HEMOGLOBIN A1C
Est. average glucose Bld gHb Est-mCnc: 143 mg/dL
Hgb A1c MFr Bld: 6.6 % — ABNORMAL HIGH (ref 4.8–5.6)

## 2018-08-30 ENCOUNTER — Telehealth: Payer: Self-pay

## 2018-08-30 NOTE — Telephone Encounter (Signed)
LM for patient to return call.

## 2018-08-30 NOTE — Telephone Encounter (Signed)
Scheduled patient to come in next week. She could not do anything sooner.

## 2018-08-30 NOTE — Telephone Encounter (Signed)
Patient having discharge and odor and mentioned at last OV. She tried OTC and no help. Requesting RX sent to [facility] for this and call back from nurse.

## 2018-09-05 ENCOUNTER — Ambulatory Visit: Payer: Self-pay | Admitting: Obstetrics and Gynecology

## 2018-09-26 ENCOUNTER — Ambulatory Visit: Payer: Self-pay | Admitting: Obstetrics and Gynecology

## 2018-10-08 ENCOUNTER — Ambulatory Visit: Payer: Self-pay

## 2018-10-08 ENCOUNTER — Other Ambulatory Visit: Payer: Self-pay

## 2018-10-08 DIAGNOSIS — Z79899 Other long term (current) drug therapy: Secondary | ICD-10-CM | POA: Insufficient documentation

## 2018-10-08 NOTE — Progress Notes (Signed)
[facility] Visit [facility]  Patient: Brandy Robinson MRN: [id number] Date of Birth: 1965-03-30 PCP: Abram Sander, MD   [name] 53 y.o. female presents for a telephone medication management visit today.  There were no vitals taken for this visit.  Patient Information   Past Medical History:  Diagnosis Date  . Anxiety   . Bipolar disorder (HCC)   . CHF (congestive heart failure) (HCC)   . Depression   . Disc disorder   . Dyspareunia   . Endometriosis   . GERD (gastroesophageal reflux disease)   . Insomnia   . Lichen   . Surgical menopause   . Vaginal atrophy   . Wears glasses       Past Surgical History:  Procedure Laterality Date  . ABDOMINAL HYSTERECTOMY  1995   tah-bso  . ARM WOUND REPAIR / CLOSURE  2013   cellulitis rt arm-i/d  . KNEE BURSECTOMY Left 09/24/2013   Procedure: IRRIGATION AND DEBRIDEMENT OF LEFT KNEE ;  Surgeon: Sheral Apley, MD;  Location: [facility];  Service: Orthopedics;  Laterality: Left;  . LAPAROTOMY N/A 12/30/2014   Procedure: EXPLORATORY LAPAROTOMY-graham patch of peptic ulcer;  Surgeon: Ricarda Frame, MD;  Location: [facility];  Service: General;  Laterality: N/A;     Family History  Problem Relation Age of Onset  . Diabetes Mother   . Diabetes Sister   . Cancer Father        Throat  . Heart disease Neg Hx   . Breast cancer Neg Hx   . Ovarian cancer Neg Hx   . Colon cancer Neg Hx     New Diagnoses (since last visit): N/A  Social History   Substance and Sexual Activity  Alcohol Use No      Social History   Tobacco Use  Smoking Status Current Every Day Smoker  . Packs/day: 1.00  . Types: Cigarettes  Smokeless Tobacco Never Used  Tobacco Comment   1800quit now      Health Maintenance  Topic Date Due  . TETANUS/TDAP  09/01/1986  . PAP SMEAR-Modifier  08/31/1988  . MAMMOGRAM  08/31/2017  . COLONOSCOPY  08/31/2017  . INFLUENZA VACCINE  08/18/2018  . HIV Screening  Completed    Health Maintenance/Date Completed  Specialist Visit: 07/31/2018 gynecology Pelvic/PAP Exam: History of hysterectomy  Mammogram: Several years ago per patient Colonoscopy: >5 years per patient Flu Vaccine: Patient has not received annual flu vaccine Pneumonia Vaccine: Not received. Advised patient to get pneumococcal vaccine with smoking status and history of pneumonia  Outpatient Encounter Medications as of 10/08/2018  Medication Sig  . buPROPion (WELLBUTRIN SR) 150 MG 12 hr tablet Take 150 mg by mouth daily. At 6 am.  . busPIRone (BUSPAR) 10 MG tablet Take 2 tablets (20 mg total) by mouth 2 (two) times daily.  . clonazePAM (KLONOPIN) 0.5 MG disintegrating tablet Take 1 tablet (0.5 mg total) by mouth at bedtime.  . cyclobenzaprine (FLEXERIL) 10 MG tablet Take 10 mg by mouth at bedtime as needed for muscle spasms.  . DULoxetine (CYMBALTA) 30 MG capsule Take 1 capsule (30 mg total) by mouth 3 (three) times daily. (Patient taking differently: Take 30 mg by mouth at bedtime. 2 capsules in the evening)  . estradiol (ESTRACE) 1 MG tablet Take 1 tablet (1 mg total) by mouth daily.  . Fluticasone-Salmeterol (ADVAIR) 100-50 MCG/DOSE AEPB Inhale 1 puff into the lungs as needed (shortness of breath).  . ondansetron (ZOFRAN-ODT) 4 MG disintegrating  tablet Take 4 mg by mouth every 8 (eight) hours as needed for nausea or vomiting.  . pantoprazole (PROTONIX) 40 MG tablet Take 1 tablet (40 mg total) by mouth daily.  . QUEtiapine (SEROQUEL) 100 MG tablet Take 100 mg by mouth daily as needed.  Marland Kitchen QUEtiapine (SEROQUEL) 400 MG tablet Take 400 mg by mouth at bedtime.  Marland Kitchen albuterol (PROVENTIL HFA;VENTOLIN HFA) 108 (90 Base) MCG/ACT inhaler Inhale 2 puffs into the lungs every 6 (six) hours as needed for wheezing or shortness of breath. (Patient not taking: Reported on 07/31/2018)  . atorvastatin (LIPITOR) 20 MG tablet Take 20 mg by mouth at bedtime.  . carvedilol (COREG) 12.5 MG tablet Take 1 tablet (12.5 mg total)  by mouth 2 (two) times daily with a meal. (Patient not taking: Reported on 04/05/2018)  . conjugated estrogens (PREMARIN) vaginal cream Place 6.30 Applicatorfuls vaginally 2 (two) times a week. (Patient not taking: Reported on 07/31/2018)  . lisinopril (PRINIVIL,ZESTRIL) 5 MG tablet Take 5 mg by mouth every evening.  . [DISCONTINUED] fluconazole (DIFLUCAN) 150 MG tablet Take 1 tablet (150 mg total) by mouth daily. (Patient not taking: Reported on 04/05/2018)  . [DISCONTINUED] furosemide (LASIX) 40 MG tablet Take 1 tablet (40 mg total) by mouth 2 (two) times daily. (Patient taking differently: Take 40 mg by mouth 2 (two) times daily as needed. )  . [DISCONTINUED] potassium chloride SA (K-DUR,KLOR-CON) 20 MEQ tablet Take 40 mEq by mouth daily.  . [DISCONTINUED] tiZANidine (ZANAFLEX) 4 MG tablet Take 4 mg by mouth at bedtime as needed for muscle spasms.  . [DISCONTINUED] traZODone (DESYREL) 100 MG tablet Take 1 tablet (100 mg total) by mouth at bedtime as needed for sleep. (Patient not taking: Reported on 04/05/2018)   No facility-administered encounter medications on file as of 10/08/2018.     Assessment and Plan: 1. CHF (carvedilol, lisinopril, lasix): Patient continues to not taking any of these medications as she believed they were related to pneumonia. Patient does endorse shortness of breath with light exercise which also may be related to smoking history. Reports some ankle swelling. Stated what each medication was for and importance of taking these with HF. Advised patient to follow-up with her primary care doctor to discuss these medications. Patient stated she understood.   2. Hyperlipidemia (atorvastatin): Not taking prescribed atrovastatin. Patient with dyslipidemia and, per recent A1c diabetes, although patient said no one had discussed her high blood sugar with her. Recommend patient take this medication to reduce risk of ASCVD.   3. Depression/Anxiety (Quetiapine, duloxetine, bupropion,  buspirone, klonopin): no current issues.  4. High blood sugar: Patient with recent A1c of 6.6 in 07/2018. She has a family history of diabetes. Medications that could be contributing include quetiapine.   5. Smoking Cessation: not interested in quitting at this time.Still smoking 1 PPD.   6. Surgical menopause: Patient takes estradiol 1 mg daily alternated with 2 mg daily. No issues.   No other issues at this time. MTM limited by telephone interview.  [place] Resident

## 2018-10-11 ENCOUNTER — Telehealth: Payer: Self-pay | Admitting: Pharmacy Technician

## 2018-10-11 NOTE — Telephone Encounter (Signed)
Received 2020 proof of income.  Patient eligible to receive medication assistance at [facility] as long as eligibility requirements continue to be met.  [place] [facility]

## 2019-01-21 ENCOUNTER — Telehealth: Payer: Self-pay

## 2019-01-21 NOTE — Telephone Encounter (Signed)
Spoke with patient to let her know that she will need to be seen. Patient can not come this week. I told her that she can try monistat OTC to see if that helps.

## 2019-01-21 NOTE — Telephone Encounter (Signed)
Pt feels like she a yeast infection. Would like Dr. Logan Bores to send a prescription to her pharmacy. Pls contact patient

## 2019-04-22 ENCOUNTER — Ambulatory Visit: Payer: Self-pay

## 2019-04-22 ENCOUNTER — Other Ambulatory Visit: Payer: Self-pay

## 2019-04-22 DIAGNOSIS — Z79899 Other long term (current) drug therapy: Secondary | ICD-10-CM

## 2019-04-22 NOTE — Progress Notes (Signed)
[facility] Visit Note  Patient: Brandy Robinson MRN: [id number] Date of Birth: May 13, 1965 PCP: Abram Sander, MD   [name] 54 y.o. female presents for a telephone medication therapy management visit with the pharmacist today. Patient identified using two patient identifiers.   There were no vitals taken for this visit.  Patient Information   Past Medical History:  Diagnosis Date  . Anxiety   . Bipolar disorder (HCC)   . CHF (congestive heart failure) (HCC)   . Depression   . Disc disorder   . Dyspareunia   . Endometriosis   . GERD (gastroesophageal reflux disease)   . Insomnia   . Lichen   . Surgical menopause   . Vaginal atrophy   . Wears glasses       Past Surgical History:  Procedure Laterality Date  . ABDOMINAL HYSTERECTOMY  1995   tah-bso  . ARM WOUND REPAIR / CLOSURE  2013   cellulitis rt arm-i/d  . KNEE BURSECTOMY Left 09/24/2013   Procedure: IRRIGATION AND DEBRIDEMENT OF LEFT KNEE ;  Surgeon: Sheral Apley, MD;  Location: [facility];  Service: Orthopedics;  Laterality: Left;  . LAPAROTOMY N/A 12/30/2014   Procedure: EXPLORATORY LAPAROTOMY-graham patch of peptic ulcer;  Surgeon: Ricarda Frame, MD;  Location: [facility];  Service: General;  Laterality: N/A;     Family History  Problem Relation Age of Onset  . Diabetes Mother   . Diabetes Sister   . Cancer Father        Throat  . Heart disease Neg Hx   . Breast cancer Neg Hx   . Ovarian cancer Neg Hx   . Colon cancer Neg Hx     New Diagnoses (since last visit): Denies any new diagnoses  Family Support: Good    Social History   Substance and Sexual Activity  Alcohol Use No   Denies alcohol or illicit substances   Social History   Tobacco Use  Smoking Status Current Every Day Smoker  . Packs/day: 1.00  . Types: Cigarettes  Smokeless Tobacco Never Used  Tobacco Comment   1800quit now   Currently smoking 1 PPD. Not interested in quitting at this  time.    Health Maintenance  Topic Date Due  . TETANUS/TDAP  Never done  . PAP SMEAR-Modifier  Never done  . MAMMOGRAM  Never done  . COLONOSCOPY  Never done  . INFLUENZA VACCINE  08/18/2019  . HIV Screening  Completed   Outpatient Encounter Medications as of 04/22/2019  Medication Sig  . buPROPion (WELLBUTRIN SR) 150 MG 12 hr tablet Take 150 mg by mouth daily. At 6 am.  . busPIRone (BUSPAR) 10 MG tablet Take 2 tablets (20 mg total) by mouth 2 (two) times daily.  . clonazePAM (KLONOPIN) 0.5 MG disintegrating tablet Take 1 tablet (0.5 mg total) by mouth at bedtime. (Patient taking differently: Take 0.5 mg by mouth at bedtime as needed. )  . conjugated estrogens (PREMARIN) vaginal cream Place 0.25 Applicatorfuls vaginally 2 (two) times a week.  . estradiol (ESTRACE) 1 MG tablet Take 1 tablet (1 mg total) by mouth daily.  . ondansetron (ZOFRAN-ODT) 4 MG disintegrating tablet Take 4 mg by mouth every 8 (eight) hours as needed for nausea or vomiting.  . pantoprazole (PROTONIX) 40 MG tablet Take 1 tablet (40 mg total) by mouth daily.  . QUEtiapine (SEROQUEL) 400 MG tablet Take 400 mg by mouth at bedtime.  Marland Kitchen albuterol (PROVENTIL HFA;VENTOLIN HFA) 108 (90 Base)  MCG/ACT inhaler Inhale 2 puffs into the lungs every 6 (six) hours as needed for wheezing or shortness of breath. (Patient not taking: Reported on 07/31/2018)  . atorvastatin (LIPITOR) 20 MG tablet Take 20 mg by mouth at bedtime.  . carvedilol (COREG) 12.5 MG tablet Take 1 tablet (12.5 mg total) by mouth 2 (two) times daily with a meal. (Patient not taking: Reported on 04/05/2018)  . cyclobenzaprine (FLEXERIL) 10 MG tablet Take 10 mg by mouth at bedtime as needed for muscle spasms.  . DULoxetine (CYMBALTA) 30 MG capsule Take 1 capsule (30 mg total) by mouth 3 (three) times daily. (Patient taking differently: Take 30 mg by mouth at bedtime. 2 capsules in the evening)  . Fluticasone-Salmeterol (ADVAIR) 100-50 MCG/DOSE AEPB Inhale 1 puff into the  lungs as needed (shortness of breath).  Marland Kitchen lisinopril (PRINIVIL,ZESTRIL) 5 MG tablet Take 5 mg by mouth every evening.  . [DISCONTINUED] QUEtiapine (SEROQUEL) 100 MG tablet Take 100 mg by mouth daily as needed.   No facility-administered encounter medications on file as of 04/22/2019.    Health Maintenance/Date Completed  Last ED visit: No recent ED visits per chart Last Visit to PCP: Unknown. Receives care at [facility] Specialist Visit: 07/31/2018 gynecology Pelvic/PAP Exam: History of hysterectomy  Mammogram: Several years ago per patient DEXA: < 20 y/o Colonoscopy: Last appears to have been in 2013 Flu Vaccine: Due Pneumonia Vaccine: Due COVID-19 Vaccine: Due Shingrix Vaccine: Due  Assessment and Plan:  1. CHF -ECHO 03/2017 with LVEF 45-50% -Prescribed lisinopril 5 mg daily and carvedilol 12.5 mg BIDM which patient denies taking as she thought they were only prescribed acutely -Patient denies receiving further cardiology follow-up after 03/2017 hospital admission  2. Hyperlipidemia -Atorvastatin 20 mg at bedtime. Patient is not taking this medication -Last lipid panel 07/2018 with LDL 135, HDL 42, TG 265  3. Depression/Anxiety  -Quetiapine 400 mg qHS, buspirone 20 mg BID, bupropion 150 mg qAM, duloxetine 90 mg/day, and clonazepam (tapering off this medication). No issues  4. High blood sugar -A1c of 6.6 in 07/2018. She has a family history of diabetes. Medications that could be contributing include quetiapine. Lifestyle management only at this time  5. COPD -Advair and albuterol PRN. Not using either inhaler. Counseled patient about importance of taking maintenance inhaler daily and washing her mouth out after use -Currently smoking 1 PPD. Not interested in quitting at this time  6. Surgical menopause  -Estradiol 1 mg daily and Premarin cream. No issues  7. Back pain -Chronic pain throughout the day which is worse at night and interferes with  sleep -Was prescribed cyclobenzaprine PRN but she is out of this medication  8. Peptic ulcer disease -Pantoprazole 40 mg daily -Not using any aspirin products or other NSAIDs  9. Medication adherence -Patient is not taking lisinopril, carvedilol, atorvastatin. Does not sound like patient has these medications at this time -Patient is not taking Advair but endorses having the inhaler at home. She is out of her albuterol inhaler.  -Requesting refill on cyclobenzaprine and albuterol inhaler  RTC 1 year for MTM  [place] Resident 22 April 2019

## 2019-04-29 ENCOUNTER — Other Ambulatory Visit: Payer: Self-pay | Admitting: Obstetrics and Gynecology

## 2019-04-29 DIAGNOSIS — Z01419 Encounter for gynecological examination (general) (routine) without abnormal findings: Secondary | ICD-10-CM

## 2019-05-29 ENCOUNTER — Telehealth: Payer: Self-pay | Admitting: Pharmacy Technician

## 2019-05-29 NOTE — Telephone Encounter (Signed)
Received updated proof of income.  Patient eligible to receive medication assistance at [facility] until time for re-certification in 9359, and as long as eligibility requirements continue to be met.  [place] [facility]

## 2019-07-10 ENCOUNTER — Other Ambulatory Visit: Payer: Self-pay | Admitting: Family Medicine

## 2019-08-11 IMAGING — CT CT ABD-PELV W/ CM
2 of 5 series · 16 of 46 positions shown, 18 images · IV contrast (APPLIED)
Comparison: 01/07/2015

CLINICAL DATA: Abdominal distention

EXAM:
CT ABDOMEN AND PELVIS WITH CONTRAST
TECHNIQUE: Multidetector CT imaging of the abdomen and pelvis was performed
using the standard protocol following bolus administration of
intravenous contrast.
CONTRAST:  100mL [id number] IOPAMIDOL ([id number]) INJECTION 61%

[Series 2: axial st · axial · 0.82mm/px · z∈[-1010,-570]mm · 13 of 100 slices shown, 15 images]
[im 6/100  soft-tissue]
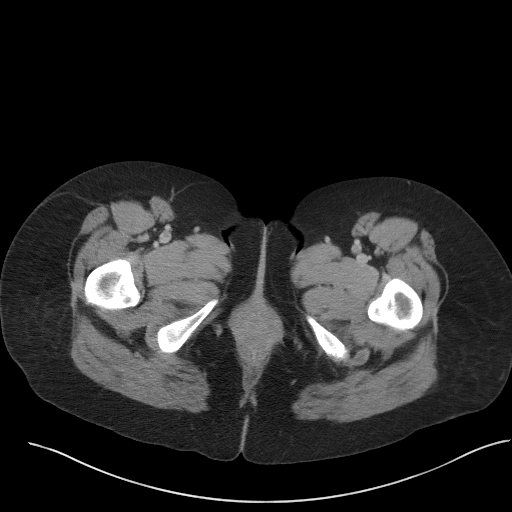
[im 6/100  bone]
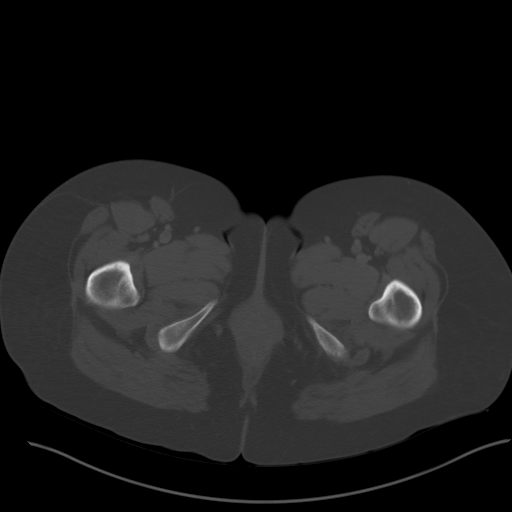
[im 16/100  soft-tissue]
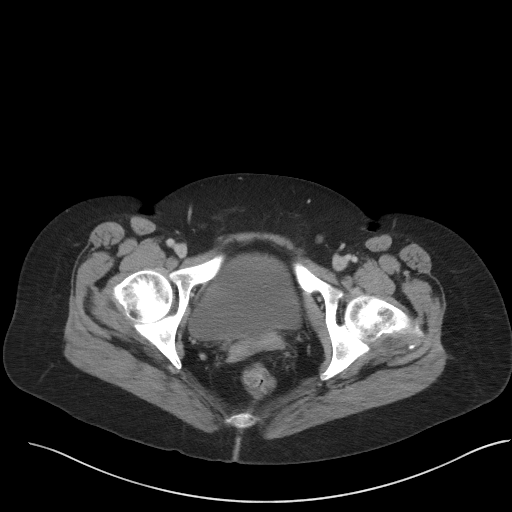
[im 21/100  soft-tissue]
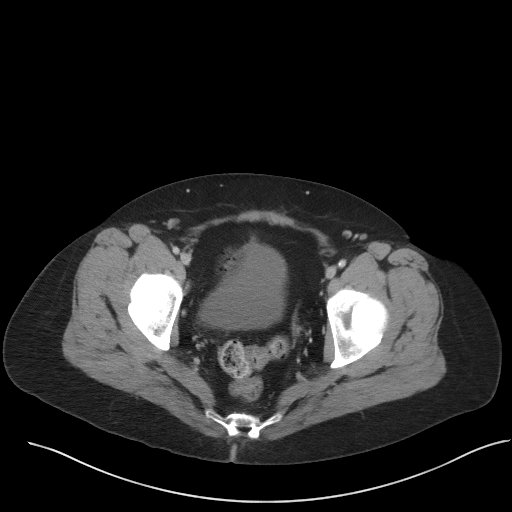
[im 27/100  soft-tissue]
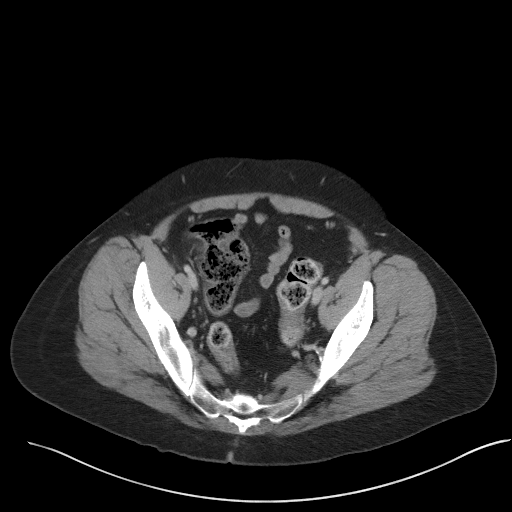
[im 37/100  soft-tissue]
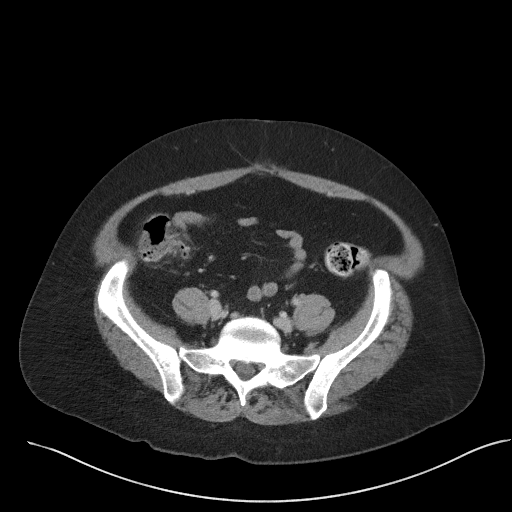
[im 42/100  soft-tissue]
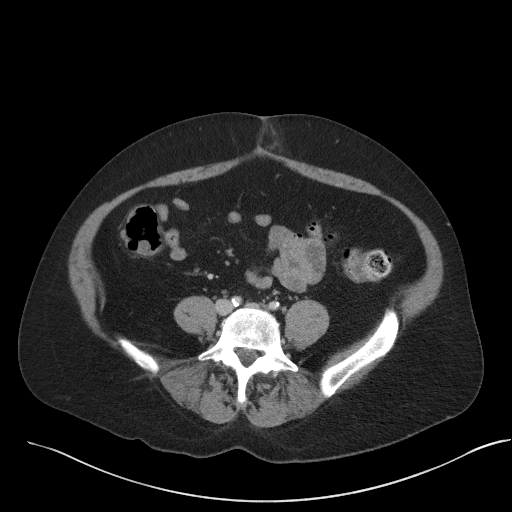
[im 53/100  soft-tissue]
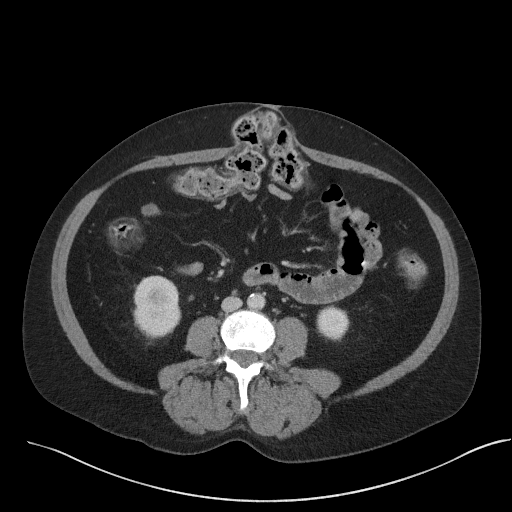
[im 58/100  soft-tissue]
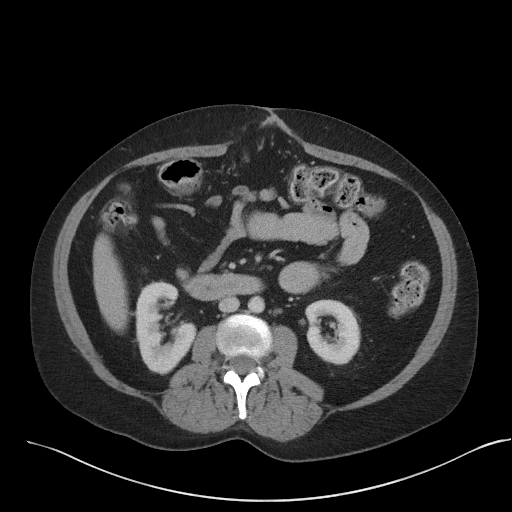
[im 63/100  soft-tissue]
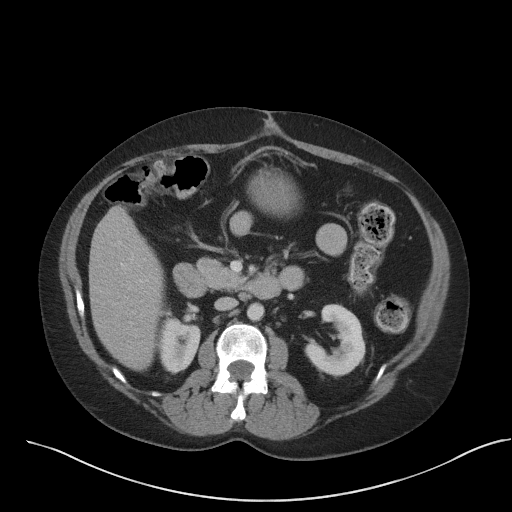
[im 63/100  bone]
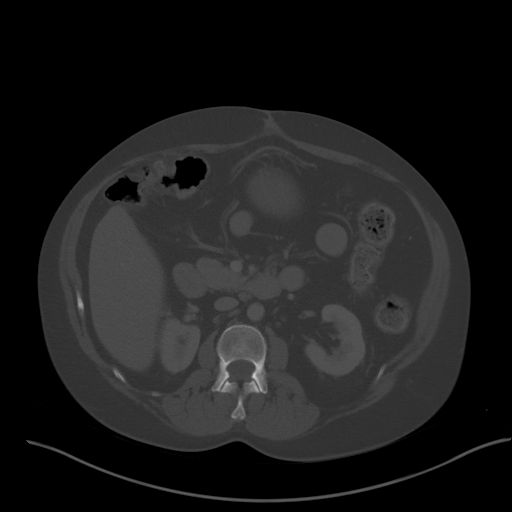
[im 73/100  soft-tissue]
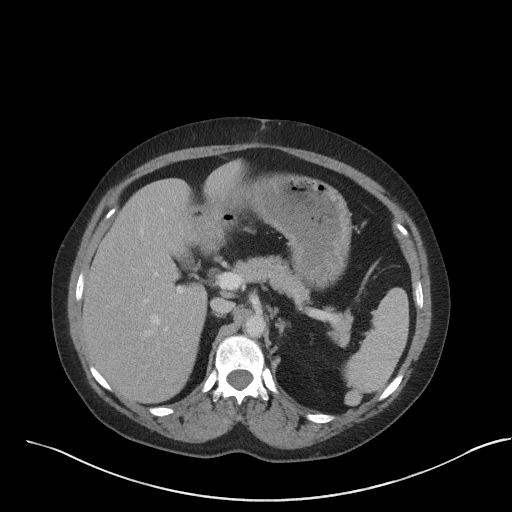
[im 79/100  soft-tissue]
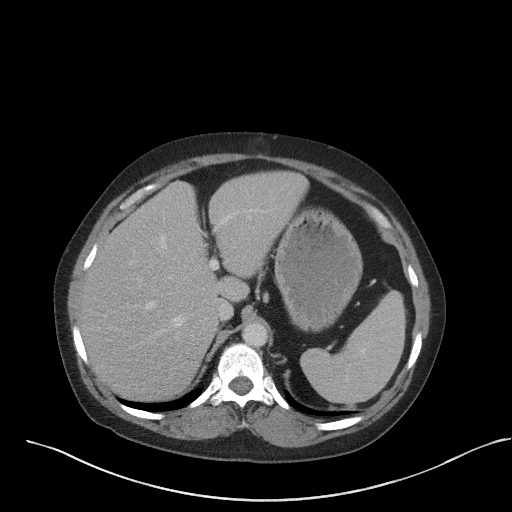
[im 84/100  soft-tissue]
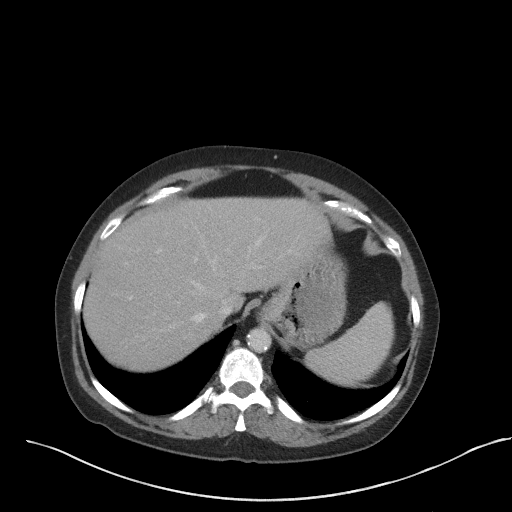
[im 94/100  soft-tissue]
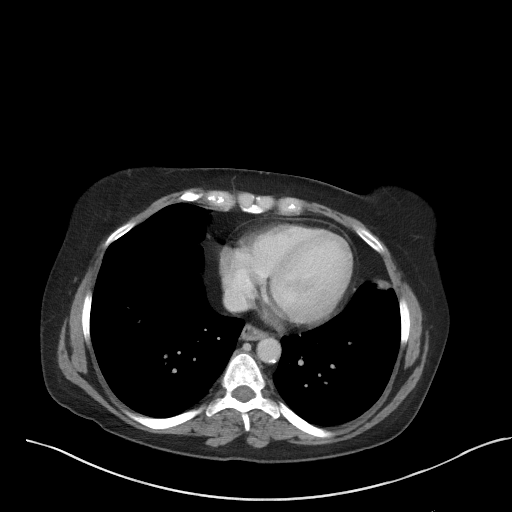

[Series 5: coronal st · coronal · 0.73mm/px · 3 of 98 slices shown]
[im 33/98  soft-tissue]
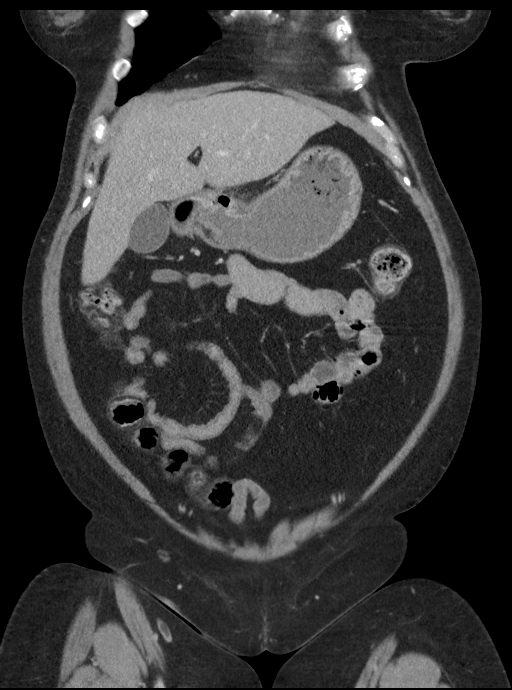
[im 44/98  soft-tissue]
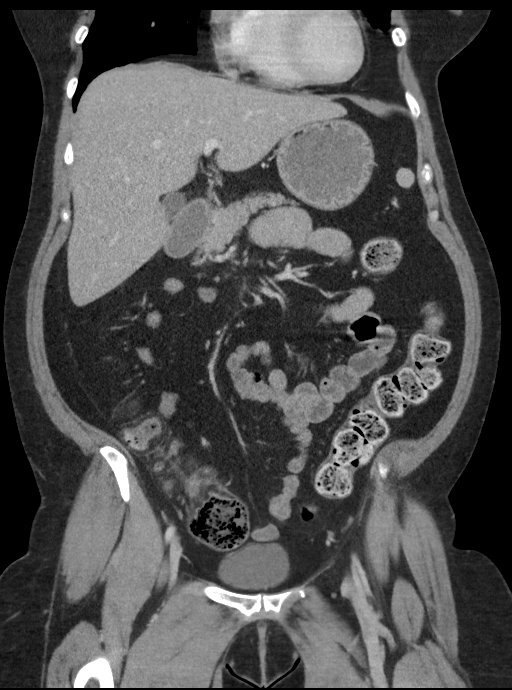
[im 54/98  soft-tissue]
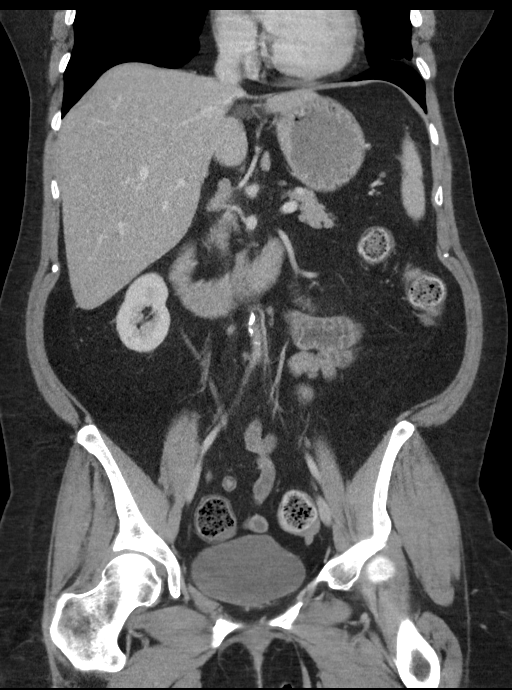

[16 of 46 positions shown; findings below may reference images not displayed]

FINDINGS: Lower chest: No acute abnormality.

Hepatobiliary: No focal liver abnormality is seen. No gallstones,
gallbladder wall thickening, or biliary dilatation.

Pancreas: Unremarkable. No pancreatic ductal dilatation or
surrounding inflammatory changes.

Spleen: Normal in size without focal abnormality.

Adrenals/Urinary Tract: Normal appearance of the adrenal glands. The
kidneys are unremarkable. No hydronephrosis or mass identified.

Stomach/Bowel: The stomach appears normal. The small bowel loops
have a normal course and caliber. No bowel obstruction. There is
mild bowel wall edema involving the terminal ileum as well as the: .
No pneumatosis or bowel perforation. No evidence for bowel
obstruction.

Vascular/Lymphatic: Aortic atherosclerosis. No aneurysm. No upper
abdominal adenopathy. There is no pelvic or inguinal adenopathy.

Reproductive: Previous hysterectomy.  No adnexal mass peer

Other: There is no ascites or focal fluid collections within the
abdomen or pelvis. Ventral abdominal wall hernia is identified which
contains a nonobstructed loop of transverse colon, image 49 of
series 2. There is an adjacent hernia which contains fat only, image
57 of series 2.

Musculoskeletal: No acute or significant osseous findings.
IMPRESSION: 1. Examination is positive for mild pancolitis with mild terminal
ileitis. No complicating features identified. Specifically there is
no bowel perforation, pneumatosis or abscess.
2.  Aortic Atherosclerosis ([id number]).

## 2019-08-16 ENCOUNTER — Other Ambulatory Visit: Payer: Self-pay | Admitting: Family Medicine

## 2019-09-04 ENCOUNTER — Other Ambulatory Visit: Payer: Self-pay | Admitting: Nurse Practitioner

## 2019-09-11 ENCOUNTER — Telehealth: Payer: Self-pay | Admitting: Pharmacist

## 2019-09-11 NOTE — Telephone Encounter (Signed)
09/11/2019 10:49:27 AM - Advair refill online with [facility]  -- Rhetta Mura - Wednesday, September 11, 2019 10:48 AM --Placed refill for Advair online with [facility], to ship 09/25/2019, Order# [id number].

## 2019-09-16 ENCOUNTER — Other Ambulatory Visit: Payer: Self-pay | Admitting: Obstetrics and Gynecology

## 2019-09-16 DIAGNOSIS — Z01419 Encounter for gynecological examination (general) (routine) without abnormal findings: Secondary | ICD-10-CM

## 2019-09-17 ENCOUNTER — Encounter: Payer: Self-pay | Admitting: Obstetrics and Gynecology

## 2019-09-17 ENCOUNTER — Other Ambulatory Visit: Payer: Self-pay | Admitting: Obstetrics and Gynecology

## 2019-09-17 ENCOUNTER — Other Ambulatory Visit (HOSPITAL_COMMUNITY)
Admission: RE | Admit: 2019-09-17 | Discharge: 2019-09-17 | Disposition: A | Discharge status: Home / Self Care | Payer: Self-pay | Source: Ambulatory Visit | Attending: Obstetrics and Gynecology | Admitting: Obstetrics and Gynecology

## 2019-09-17 ENCOUNTER — Other Ambulatory Visit: Payer: Self-pay

## 2019-09-17 ENCOUNTER — Ambulatory Visit: Payer: Self-pay | Admitting: Obstetrics and Gynecology

## 2019-09-17 VITALS — BP 139/81 | HR 102 | Ht 63.0 in | Wt 184.3 lb

## 2019-09-17 DIAGNOSIS — Z1231 Encounter for screening mammogram for malignant neoplasm of breast: Secondary | ICD-10-CM

## 2019-09-17 DIAGNOSIS — N898 Other specified noninflammatory disorders of vagina: Secondary | ICD-10-CM | POA: Insufficient documentation

## 2019-09-17 DIAGNOSIS — L900 Lichen sclerosus et atrophicus: Secondary | ICD-10-CM

## 2019-09-17 DIAGNOSIS — Z01419 Encounter for gynecological examination (general) (routine) without abnormal findings: Secondary | ICD-10-CM

## 2019-09-17 MED ORDER — CLOBETASOL PROPIONATE 0.05 % EX CREA: 1.0000 "application " | TOPICAL_CREAM | Freq: Every day | CUTANEOUS | 2 refills | Status: DC

## 2019-09-17 MED ORDER — ESTRADIOL 1 MG PO TABS: 1.0000 mg | ORAL_TABLET | Freq: Every day | ORAL | 0 refills | Status: DC

## 2019-09-17 NOTE — Progress Notes (Signed)
HPI:      Brandy Robinson is a 54 y.o. G1P1001 who LMP was No LMP recorded. Patient has had a hysterectomy.  Subjective:   She presents today for her annual examination.  She states that she is using Estrace but uses the clobetasol cream for lichen sclerosus rarely.  She says she only uses it when she has burning or itching of the vulva.  Patient recently began having intercourse and noticed significant pain with intercourse in the posterior vagina area.  She says she has not well-lubricated with intercourse.  Patient states that she has had a vaginal discharge for several years and occasionally has used Diflucan which helped but she says that she continues to have the discharge.  She says she is not concerned regarding sexual partners at this time.  Continues to complain of stress urinary incontinence.  It is about the same as last year.  Patient discontinued tobacco use!!!!!!    Hx: The following portions of the patient's history were reviewed and updated as appropriate:             She  has a past medical history of Anxiety, Bipolar disorder (HCC), CHF (congestive heart failure) (HCC), Depression, Disc disorder, Dyspareunia, Endometriosis, GERD (gastroesophageal reflux disease), Insomnia, Lichen, Surgical menopause, Vaginal atrophy, and Wears glasses. She does not have any pertinent problems on file. She  has a past surgical history that includes Abdominal hysterectomy (1995); Arm wound repair / closure (2013); Knee bursectomy (Left, 09/24/2013); and laparotomy (N/A, 12/30/2014). Her family history includes Cancer in her father; Diabetes in her mother and sister. She  reports that she has been smoking cigarettes. She has been smoking about 1.00 pack per day. She has never used smokeless tobacco. She reports that she does not drink alcohol and does not use drugs. She has a current medication list which includes the following prescription(s): albuterol, atorvastatin, bupropion, buspirone,  clonazepam, conjugated estrogens, cyclobenzaprine, duloxetine, estradiol, fluticasone-salmeterol, lisinopril, ondansetron, pantoprazole, quetiapine, carvedilol, and clobetasol cream. She is allergic to hydrocodone, aspirin, and morphine and related.       Review of Systems:  Review of Systems  Constitutional: Denied constitutional symptoms, night sweats, recent illness, fatigue, fever, insomnia and weight loss.  Eyes: Denied eye symptoms, eye pain, photophobia, vision change and visual disturbance.  Ears/Nose/Throat/Neck: Denied ear, nose, throat or neck symptoms, hearing loss, nasal discharge, sinus congestion and sore throat.  Cardiovascular: Denied cardiovascular symptoms, arrhythmia, chest pain/pressure, edema, exercise intolerance, orthopnea and palpitations.  Respiratory: Denied pulmonary symptoms, asthma, pleuritic pain, productive sputum, cough, dyspnea and wheezing.  Gastrointestinal: Denied, gastro-esophageal reflux, melena, nausea and vomiting.  Genitourinary: See HPI for additional information.  Musculoskeletal: Denied musculoskeletal symptoms, stiffness, swelling, muscle weakness and myalgia.  Dermatologic: Denied dermatology symptoms, rash and scar.  Neurologic: Denied neurology symptoms, dizziness, headache, neck pain and syncope.  Psychiatric: Denied psychiatric symptoms, anxiety and depression.  Endocrine: Denied endocrine symptoms including hot flashes and night sweats.   Meds:   Current Outpatient Medications on File Prior to Visit  Medication Sig Dispense Refill  . albuterol (PROVENTIL HFA;VENTOLIN HFA) 108 (90 Base) MCG/ACT inhaler Inhale 2 puffs into the lungs every 6 (six) hours as needed for wheezing or shortness of breath. 1 Inhaler 2  . atorvastatin (LIPITOR) 20 MG tablet Take 20 mg by mouth at bedtime.    Marland Kitchen buPROPion (WELLBUTRIN SR) 150 MG 12 hr tablet Take 150 mg by mouth daily. At 6 am.    . busPIRone (BUSPAR) 10 MG tablet Take 2  tablets (20 mg total) by mouth 2  (two) times daily. 60 tablet 0  . clonazePAM (KLONOPIN) 0.5 MG disintegrating tablet Take 1 tablet (0.5 mg total) by mouth at bedtime. (Patient taking differently: Take 0.5 mg by mouth at bedtime as needed. ) 30 tablet 0  . conjugated estrogens (PREMARIN) vaginal cream Place 0.25 Applicatorfuls vaginally 2 (two) times a week. 42.5 g 12  . cyclobenzaprine (FLEXERIL) 10 MG tablet Take 10 mg by mouth at bedtime as needed for muscle spasms.    . DULoxetine (CYMBALTA) 30 MG capsule Take 1 capsule (30 mg total) by mouth 3 (three) times daily. (Patient taking differently: Take 30 mg by mouth at bedtime. 2 capsules in the evening) 90 capsule 0  . Fluticasone-Salmeterol (ADVAIR) 100-50 MCG/DOSE AEPB Inhale 1 puff into the lungs as needed (shortness of breath).    Marland Kitchen lisinopril (PRINIVIL,ZESTRIL) 5 MG tablet Take 5 mg by mouth every evening.    . ondansetron (ZOFRAN-ODT) 4 MG disintegrating tablet Take 4 mg by mouth every 8 (eight) hours as needed for nausea or vomiting.    . pantoprazole (PROTONIX) 40 MG tablet Take 1 tablet (40 mg total) by mouth daily. 60 tablet 0  . QUEtiapine (SEROQUEL) 400 MG tablet Take 400 mg by mouth at bedtime.    . carvedilol (COREG) 12.5 MG tablet Take 1 tablet (12.5 mg total) by mouth 2 (two) times daily with a meal. (Patient not taking: Reported on 09/17/2019) 60 tablet 0   No current facility-administered medications on file prior to visit.    Objective:     Vitals:   09/17/19 1335  BP: 139/81  Pulse: (!) 102              Physical examination General NAD, Conversant  HEENT Atraumatic; Op clear with mmm.  Normo-cephalic. Pupils reactive. Anicteric sclerae  Thyroid/Neck Smooth without nodularity or enlargement. Normal ROM.  Neck Supple.  Skin No rashes, lesions or ulceration. Normal palpated skin turgor. No nodularity.  Breasts: No masses or discharge.  Symmetric.  No axillary adenopathy.  Lungs: Clear to auscultation.No rales or wheezes. Normal Respiratory effort, no  retractions.  Heart: NSR.  No murmurs or rubs appreciated. No periferal edema  Abdomen: Soft.  Non-tender.  No masses.  No HSM.  Ventral hernia associated with scar  Extremities: Moves all appropriately.  Normal ROM for age. No lymphadenopathy.  Neuro: Oriented to PPT.  Normal mood. Normal affect.     Pelvic:   Vulva:  Lichen sclerosus -pain localized to posterior fourchette.  Vagina: No lesions or abnormalities noted.  Support: Normal pelvic support.  Urethra No masses tenderness or scarring.  Meatus Normal size without lesions or prolapse.  Cervix:  Surgically absent  Anus: Normal exam.  No lesions.  Perineum: Normal exam.  No lesions.        Bimanual   Uterus:  Surgically absent  Adnexae: No masses.  Non-tender to palpation.  Cul-de-sac: Negative for abnormality.      Assessment:    G1P1001 Patient Active Problem List   Diagnosis Date Noted  . Encounter for medication management 10/08/2018  . SUI (stress urinary incontinence, female) 05/19/2017  . Generalized anxiety disorder 03/21/2017  . CAP (community acquired pneumonia) 03/18/2017  . Acute respiratory failure (HCC)   . Perforated viscus 12/30/2014  . Lichen sclerosus 09/24/2014  . Surgical menopause 09/24/2014  . Endometriosis 09/24/2014  . Bipolar 1 disorder (HCC) 09/24/2014  . Insomnia 09/24/2014  . GERD (gastroesophageal reflux disease) 09/24/2014  . History of  stomach ulcers 09/24/2014     1. Encounter for screening mammogram for malignant neoplasm of breast   2. Well woman exam with routine gynecological exam   3. Lichen sclerosus   4. Vaginal discharge     Patient has experienced pain with intercourse after not having intercourse for several years.  This is likely associated with small introitus, lichen sclerosus and lack of vaginal lubrication.   Plan:            1.  Basic Screening Recommendations The basic screening recommendations for asymptomatic women were discussed with the patient during her  visit.  The age-appropriate recommendations were discussed with her and the rational for the tests reviewed.  When I am informed by the patient that another primary care physician has previously obtained the age-appropriate tests and they are up-to-date, only outstanding tests are ordered and referrals given as necessary.  Abnormal results of tests will be discussed with her when all of her results are completed.  Routine preventative health maintenance measures emphasized: Exercise/Diet/Weight control, Tobacco Warnings, Alcohol/Substance use risks and Stress Management 2.  NuSwab performed 3.  Mammogram ordered 4.  Advised use of clobetasol more frequently and then biweekly 5.  Continue Estrace 6.  Patient to consider TOT for urine loss once Covid restrictions are lifted.   Orders Orders Placed This Encounter  Procedures  . MM 3D SCREEN BREAST BILATERAL     Meds ordered this encounter  Medications  . estradiol (ESTRACE) 1 MG tablet    Sig: Take 1 tablet (1 mg total) by mouth daily.    Dispense:  30 tablet    Refill:  0  . clobetasol cream (TEMOVATE) 0.05 %    Sig: Apply 1 application topically daily. Use for 2 weeks daily then 2 times per week    Dispense:  60 g    Refill:  2            F/U  No follow-ups on file.  Elonda Husky, M.D. 09/17/2019 2:07 PM

## 2019-09-17 NOTE — Addendum Note (Signed)
Addended by: Dorian Pod on: 09/17/2019 02:39 PM   Modules accepted: Orders

## 2019-09-18 ENCOUNTER — Telehealth: Payer: Self-pay | Admitting: Obstetrics and Gynecology

## 2019-09-18 DIAGNOSIS — Z01419 Encounter for gynecological examination (general) (routine) without abnormal findings: Secondary | ICD-10-CM

## 2019-09-18 DIAGNOSIS — L900 Lichen sclerosus et atrophicus: Secondary | ICD-10-CM

## 2019-09-18 NOTE — Telephone Encounter (Signed)
Pt called in and stated that she was seen yesterday and her refills were sent to the wrong pharmacy the pt uses [facility] - [place], [place] - [address] . The pt stated it was her clobetasol cream (TEMOVATE) And estradiol (ESTRACE). I told the pt I will send a message to the nurse. Please advise

## 2019-09-19 ENCOUNTER — Other Ambulatory Visit: Payer: Self-pay | Admitting: Obstetrics and Gynecology

## 2019-09-19 LAB — CERVICOVAGINAL ANCILLARY ONLY
Bacterial Vaginitis (gardnerella): POSITIVE — AB
Candida Glabrata: POSITIVE — AB
Candida Vaginitis: NEGATIVE
Chlamydia: NEGATIVE
Comment: NEGATIVE
Comment: NEGATIVE
Comment: NEGATIVE
Comment: NEGATIVE
Comment: NEGATIVE
Comment: NORMAL
Neisseria Gonorrhea: NEGATIVE
Trichomonas: NEGATIVE

## 2019-09-19 MED ORDER — ESTRADIOL 1 MG PO TABS: 1.0000 mg | ORAL_TABLET | Freq: Every day | ORAL | 11 refills | Status: DC

## 2019-09-19 MED ORDER — CLOBETASOL PROPIONATE 0.05 % EX CREA: 1.0000 "application " | TOPICAL_CREAM | Freq: Every day | CUTANEOUS | 2 refills | Status: AC

## 2019-09-19 NOTE — Telephone Encounter (Signed)
Sent prescriptions to correct pharmacy. LM for patient that medication has been sent in.

## 2019-09-20 ENCOUNTER — Other Ambulatory Visit: Payer: Self-pay

## 2019-09-20 MED ORDER — METRONIDAZOLE 500 MG PO TABS: 500.0000 mg | ORAL_TABLET | Freq: Two times a day (BID) | ORAL | 0 refills | Status: DC

## 2019-09-20 MED ORDER — FLUCONAZOLE 150 MG PO TABS: 150.0000 mg | ORAL_TABLET | Freq: Once | ORAL | 0 refills | Status: AC

## 2019-10-17 NOTE — Progress Notes (Signed)
Patient pre-screened for B[facility]CP eligibility due to COVID 19 precautions. Two patient identifiers used for verification that I was speaking to correct patient.  Patient to Present directly to [facility] 09/22/19 for B[facility] screening mammogram.

## 2019-10-18 ENCOUNTER — Telehealth: Payer: Self-pay

## 2019-10-18 NOTE — Telephone Encounter (Signed)
Patient called in stating that she thinks she has a lingering vaginal infection and was wanting to know if her provider could call her in a Rx using [facility]. Could you please advise?

## 2019-10-18 NOTE — Telephone Encounter (Signed)
LM for patient to return call. If patient calls back she will need to be scheduled.

## 2019-10-22 ENCOUNTER — Ambulatory Visit: Payer: Self-pay | Attending: Oncology

## 2019-10-22 ENCOUNTER — Other Ambulatory Visit: Payer: Self-pay

## 2019-10-22 ENCOUNTER — Ambulatory Visit
Admission: RE | Admit: 2019-10-22 | Discharge: 2019-10-22 | Disposition: A | Discharge status: Home / Self Care | Payer: Self-pay | Source: Ambulatory Visit | Attending: Oncology | Admitting: Oncology

## 2019-10-22 DIAGNOSIS — Z0000 Encounter for general adult medical examination without abnormal findings: Secondary | ICD-10-CM

## 2019-10-22 NOTE — Telephone Encounter (Signed)
LM for patient to return call.

## 2019-10-30 ENCOUNTER — Telehealth: Payer: Self-pay

## 2019-10-30 NOTE — Telephone Encounter (Signed)
Called last week and was given ATB for an infection.   SX are now worse.   Medication management- pharmacy  Pls advise.

## 2019-10-30 NOTE — Telephone Encounter (Signed)
Per Dr. Logan Bores can't refill medication. I have scheduled patient to come in Monday.

## 2019-11-04 ENCOUNTER — Other Ambulatory Visit: Payer: Self-pay

## 2019-11-04 ENCOUNTER — Ambulatory Visit: Payer: Self-pay | Admitting: Obstetrics and Gynecology

## 2019-11-04 ENCOUNTER — Other Ambulatory Visit (HOSPITAL_COMMUNITY)
Admission: RE | Admit: 2019-11-04 | Discharge: 2019-11-04 | Disposition: A | Discharge status: Home / Self Care | Payer: Self-pay | Source: Ambulatory Visit | Attending: Obstetrics and Gynecology | Admitting: Obstetrics and Gynecology

## 2019-11-04 ENCOUNTER — Encounter: Payer: Self-pay | Admitting: Obstetrics and Gynecology

## 2019-11-04 VITALS — BP 147/69 | HR 116 | Ht 63.0 in | Wt 183.8 lb

## 2019-11-04 DIAGNOSIS — N949 Unspecified condition associated with female genital organs and menstrual cycle: Secondary | ICD-10-CM | POA: Insufficient documentation

## 2019-11-04 DIAGNOSIS — B009 Herpesviral infection, unspecified: Secondary | ICD-10-CM

## 2019-11-04 DIAGNOSIS — L900 Lichen sclerosus et atrophicus: Secondary | ICD-10-CM

## 2019-11-04 NOTE — Progress Notes (Signed)
Pt present due to having vaginal itching and burning. Pt stated that her last visit she was given antibiotic and afterwards she noticed itching/burning and swelling in the vaginal area.

## 2019-11-04 NOTE — Progress Notes (Signed)
HPI:      [name] is a 54 y.o. G1P1001 who LMP was No LMP recorded. Patient has had a hysterectomy.  Subjective:   She presents today with complaint of a 2-week history of vaginal burning.  She says that has steadily gotten worse.  She said it is especially bad when urinating-when the urine touches her labia.  She did have monilia and BV in August but took appropriate medications.  She does have a history of lichen sclerosus and has been using the cream appropriately. She reports one sexual encounter a few months prior, but prior to that no sex for 5 years.  She was previously tested for STDs with her last NuSwab. Also of significant note patient states that she was previously diagnosed with diabetes and told to take a pill but she has not been compliant with this.    Hx: The following portions of the patient's history were reviewed and updated as appropriate:             She  has a past medical history of Anxiety, Bipolar disorder (HCC), CHF (congestive heart failure) (HCC), Depression, Disc disorder, Dyspareunia, Endometriosis, GERD (gastroesophageal reflux disease), Insomnia, Lichen, Surgical menopause, Vaginal atrophy, and Wears glasses. She does not have any pertinent problems on file. She  has a past surgical history that includes Abdominal hysterectomy (1995); Arm wound repair / closure (2013); Knee bursectomy (Left, 09/24/2013); and laparotomy (N/A, 12/30/2014). Her family history includes Cancer in her father; Diabetes in her mother and sister. She  reports that she has been smoking cigarettes. She has been smoking about 1.00 pack per day. She has never used smokeless tobacco. She reports that she does not drink alcohol and does not use drugs. She has a current medication list which includes the following prescription(s): albuterol, atorvastatin, bupropion, buspirone, clonazepam, conjugated estrogens, cyclobenzaprine, duloxetine, estradiol, fluticasone-salmeterol, lisinopril,  ondansetron, pantoprazole, quetiapine, and carvedilol. She is allergic to hydrocodone, aspirin, and morphine and related.       Review of Systems:  Review of Systems  Constitutional: Denied constitutional symptoms, night sweats, recent illness, fatigue, fever, insomnia and weight loss.  Eyes: Denied eye symptoms, eye pain, photophobia, vision change and visual disturbance.  Ears/Nose/Throat/Neck: Denied ear, nose, throat or neck symptoms, hearing loss, nasal discharge, sinus congestion and sore throat.  Cardiovascular: Denied cardiovascular symptoms, arrhythmia, chest pain/pressure, edema, exercise intolerance, orthopnea and palpitations.  Respiratory: Denied pulmonary symptoms, asthma, pleuritic pain, productive sputum, cough, dyspnea and wheezing.  Gastrointestinal: Denied, gastro-esophageal reflux, melena, nausea and vomiting.  Genitourinary: See HPI for additional information.  Musculoskeletal: Denied musculoskeletal symptoms, stiffness, swelling, muscle weakness and myalgia.  Dermatologic: Denied dermatology symptoms, rash and scar.  Neurologic: Denied neurology symptoms, dizziness, headache, neck pain and syncope.  Psychiatric: Denied psychiatric symptoms, anxiety and depression.  Endocrine: Denied endocrine symptoms including hot flashes and night sweats.   Meds:   Current Outpatient Medications on File Prior to Visit  Medication Sig Dispense Refill  . albuterol (PROVENTIL HFA;VENTOLIN HFA) 108 (90 Base) MCG/ACT inhaler Inhale 2 puffs into the lungs every 6 (six) hours as needed for wheezing or shortness of breath. 1 Inhaler 2  . atorvastatin (LIPITOR) 20 MG tablet Take 20 mg by mouth at bedtime.    Marland Kitchen buPROPion (WELLBUTRIN SR) 150 MG 12 hr tablet Take 150 mg by mouth daily. At 6 am.    . busPIRone (BUSPAR) 10 MG tablet Take 2 tablets (20 mg total) by mouth 2 (two) times daily. 60 tablet 0  .  clonazePAM (KLONOPIN) 0.5 MG disintegrating tablet Take 1 tablet (0.5 mg total) by mouth at  bedtime. (Patient taking differently: Take 0.5 mg by mouth at bedtime as needed. ) 30 tablet 0  . conjugated estrogens (PREMARIN) vaginal cream Place 0.25 Applicatorfuls vaginally 2 (two) times a week. 42.5 g 12  . cyclobenzaprine (FLEXERIL) 10 MG tablet Take 10 mg by mouth at bedtime as needed for muscle spasms.    . DULoxetine (CYMBALTA) 30 MG capsule Take 1 capsule (30 mg total) by mouth 3 (three) times daily. (Patient taking differently: Take 30 mg by mouth at bedtime. 2 capsules in the evening) 90 capsule 0  . estradiol (ESTRACE) 1 MG tablet Take 1 tablet (1 mg total) by mouth daily. 30 tablet 11  . Fluticasone-Salmeterol (ADVAIR) 100-50 MCG/DOSE AEPB Inhale 1 puff into the lungs as needed (shortness of breath).    Marland Kitchen lisinopril (PRINIVIL,ZESTRIL) 5 MG tablet Take 5 mg by mouth every evening.    . ondansetron (ZOFRAN-ODT) 4 MG disintegrating tablet Take 4 mg by mouth every 8 (eight) hours as needed for nausea or vomiting.    . pantoprazole (PROTONIX) 40 MG tablet Take 1 tablet (40 mg total) by mouth daily. 60 tablet 0  . QUEtiapine (SEROQUEL) 400 MG tablet Take 400 mg by mouth at bedtime.    . carvedilol (COREG) 12.5 MG tablet Take 1 tablet (12.5 mg total) by mouth 2 (two) times daily with a meal. (Patient not taking: Reported on 09/17/2019) 60 tablet 0   No current facility-administered medications on file prior to visit.          Objective:     Vitals:   11/04/19 0924  BP: (!) 147/69  Pulse: (!) 116   Filed Weights   11/04/19 0924  Weight: 183 lb 12.8 oz (83.4 kg)     Physical examination   Pelvic:   Vulva:  Erythematous and irritated.  Right labia minus ulcerated lesion very painful to palpation.  Exudate present.  Both labia very tender.  Vagina: No lesions or abnormalities noted.  Support:   Urethra No masses tenderness or scarring.  Meatus Normal size without lesions or prolapse.  Anus: Normal exam.  No lesions.  Perineum: Normal exam.  No lesions.  Lichen sclerosus  looks very good     Assessment:    G1P1001 Patient Active Problem List   Diagnosis Date Noted  . Encounter for medication management 10/08/2018  . SUI (stress urinary incontinence, female) 05/19/2017  . Generalized anxiety disorder 03/21/2017  . CAP (community acquired pneumonia) 03/18/2017  . Acute respiratory failure (HCC)   . Perforated viscus 12/30/2014  . Lichen sclerosus 09/24/2014  . Surgical menopause 09/24/2014  . Endometriosis 09/24/2014  . Bipolar 1 disorder (HCC) 09/24/2014  . Insomnia 09/24/2014  . GERD (gastroesophageal reflux disease) 09/24/2014  . History of stomach ulcers 09/24/2014     1. Vaginal burning   2. Lichen sclerosus   3. HSV infection     Lichen sclerosus does not seem to be the issue today.  This seems to be improving.  Ulcerated lesion on right labia with very tender labia bilaterally -possibly HSV  (patient states she has no history of HSV)   Plan:            1.  NuSwab performed  2.  Viral cultures  3.  Consider antibody testing if viral culture is negative  4.  Symptomatic relief with urination discussed using lukewarm water, urinating in bathtub etc. Orders No orders of the  defined types were placed in this encounter.   No orders of the defined types were placed in this encounter.     F/U  Return for We will contact her with any abnormal test results. I spent 22 minutes involved in the care of this patient preparing to see the patient by obtaining and reviewing her medical history (including labs, imaging tests and prior procedures), documenting clinical information in the electronic health record (EHR), counseling and coordinating care plans, writing and sending prescriptions, ordering tests or procedures and directly communicating with the patient by discussing pertinent items from her history and physical exam as well as detailing my assessment and plan as noted above so that she has an informed understanding.  All of her questions  were answered. Elonda Husky, M.D. 11/04/2019 9:49 AM

## 2019-11-05 LAB — CERVICOVAGINAL ANCILLARY ONLY
Bacterial Vaginitis (gardnerella): POSITIVE — AB
Candida Glabrata: POSITIVE — AB
Candida Vaginitis: NEGATIVE
Comment: NEGATIVE
Comment: NEGATIVE
Comment: NEGATIVE

## 2019-11-06 ENCOUNTER — Telehealth: Payer: Self-pay

## 2019-11-06 ENCOUNTER — Other Ambulatory Visit: Payer: Self-pay | Admitting: Surgical

## 2019-11-06 MED ORDER — METRONIDAZOLE 500 MG PO TABS: 500.0000 mg | ORAL_TABLET | Freq: Two times a day (BID) | ORAL | 0 refills | Status: DC

## 2019-11-06 MED ORDER — FLUCONAZOLE 150 MG PO TABS: 150.0000 mg | ORAL_TABLET | Freq: Every day | ORAL | 0 refills | Status: DC

## 2019-11-06 NOTE — Telephone Encounter (Signed)
Pt would like to know the results of her swab and what to do next.   pls advise.

## 2019-11-07 LAB — HERPES SIMPLEX VIRUS CULTURE

## 2019-11-07 NOTE — Telephone Encounter (Signed)
Sent patient a my chart message with info. Patient read 11/06/2019 at 4:45 PM.

## 2019-11-08 NOTE — Progress Notes (Signed)
Letter mailed from [facility] to notify of normal mammogram results.  Patient to return in one year for annual screening.  Copy to [facility]. 

## 2020-01-13 ENCOUNTER — Other Ambulatory Visit: Payer: Self-pay | Admitting: Emergency Medicine

## 2020-01-13 ENCOUNTER — Encounter: Payer: Self-pay | Admitting: Emergency Medicine

## 2020-01-13 ENCOUNTER — Emergency Department
Admission: EM | Admit: 2020-01-13 | Discharge: 2020-01-13 | Disposition: A | Discharge status: Home / Self Care | Payer: Self-pay | Attending: Emergency Medicine | Admitting: Emergency Medicine

## 2020-01-13 ENCOUNTER — Other Ambulatory Visit: Payer: Self-pay

## 2020-01-13 ENCOUNTER — Emergency Department: Payer: Self-pay

## 2020-01-13 DIAGNOSIS — I110 Hypertensive heart disease with heart failure: Secondary | ICD-10-CM | POA: Insufficient documentation

## 2020-01-13 DIAGNOSIS — E860 Dehydration: Secondary | ICD-10-CM | POA: Insufficient documentation

## 2020-01-13 DIAGNOSIS — I509 Heart failure, unspecified: Secondary | ICD-10-CM | POA: Insufficient documentation

## 2020-01-13 DIAGNOSIS — Z79899 Other long term (current) drug therapy: Secondary | ICD-10-CM | POA: Insufficient documentation

## 2020-01-13 DIAGNOSIS — N3001 Acute cystitis with hematuria: Secondary | ICD-10-CM | POA: Insufficient documentation

## 2020-01-13 LAB — COMPREHENSIVE METABOLIC PANEL
ALT: 20 U/L (ref 0–44)
AST: 39 U/L (ref 15–41)
Albumin: 3.6 g/dL (ref 3.5–5.0)
Alkaline Phosphatase: 143 U/L — ABNORMAL HIGH (ref 38–126)
Anion gap: 9 (ref 5–15)
BUN: 10 mg/dL (ref 6–20)
CO2: 26 mmol/L (ref 22–32)
Calcium: 8.8 mg/dL — ABNORMAL LOW (ref 8.9–10.3)
Chloride: 97 mmol/L — ABNORMAL LOW (ref 98–111)
Creatinine, Ser: 0.67 mg/dL (ref 0.44–1.00)
GFR, Estimated: >60 mL/min (ref >60)
Glucose, Bld: 197 mg/dL — ABNORMAL HIGH (ref 70–99)
Potassium: 3.6 mmol/L (ref 3.5–5.1)
Sodium: 132 mmol/L — ABNORMAL LOW (ref 135–145)
Total Bilirubin: 0.5 mg/dL (ref 0.3–1.2)
Total Protein: 7.1 g/dL (ref 6.5–8.1)

## 2020-01-13 LAB — URINALYSIS, COMPLETE (UACMP) WITH MICROSCOPIC
Bilirubin Urine: NEGATIVE
Glucose, UA: NEGATIVE mg/dL
Hgb urine dipstick: NEGATIVE
Ketones, ur: NEGATIVE mg/dL
Nitrite: NEGATIVE
Protein, ur: 30 mg/dL — AB
Specific Gravity, Urine: 1.025 (ref 1.005–1.030)
pH: 5 (ref 5.0–8.0)

## 2020-01-13 LAB — CBC
HCT: 38.1 % (ref 36.0–46.0)
Hemoglobin: 12.9 g/dL (ref 12.0–15.0)
MCH: 30.5 pg (ref 26.0–34.0)
MCHC: 33.9 g/dL (ref 30.0–36.0)
MCV: 90.1 fL (ref 80.0–100.0)
Platelets: 294 10*3/uL (ref 150–400)
RBC: 4.23 MIL/uL (ref 3.87–5.11)
RDW: 13.2 % (ref 11.5–15.5)
WBC: 7.7 10*3/uL (ref 4.0–10.5)
nRBC: 0 % (ref 0.0–0.2)

## 2020-01-13 MED ORDER — SODIUM CHLORIDE 0.9 % IV SOLN
1.0000 g | Freq: Once | INTRAVENOUS | Status: AC
  Administered 2020-01-13: 11:00:00 1 g via INTRAVENOUS
  Filled 2020-01-13: qty 10

## 2020-01-13 MED ORDER — CEPHALEXIN 500 MG PO CAPS: 500.0000 mg | ORAL_CAPSULE | Freq: Four times a day (QID) | ORAL | 0 refills | Status: DC

## 2020-01-13 MED ORDER — ACETAMINOPHEN 500 MG PO TABS
1000.0000 mg | ORAL_TABLET | Freq: Once | ORAL | Status: AC
  Administered 2020-01-13: 10:00:00 1000 mg via ORAL
  Filled 2020-01-13: qty 2

## 2020-01-13 MED ORDER — KETOROLAC TROMETHAMINE 30 MG/ML IJ SOLN
30.0000 mg | Freq: Once | INTRAMUSCULAR | Status: AC
  Administered 2020-01-13: 10:00:00 30 mg via INTRAVENOUS
  Filled 2020-01-13: qty 1

## 2020-01-13 MED ORDER — LACTATED RINGERS IV BOLUS: 1000.0000 mL | Freq: Once | INTRAVENOUS | Status: DC

## 2020-01-13 MED ORDER — SODIUM CHLORIDE 0.9 % IV BOLUS
1000.0000 mL | Freq: Once | INTRAVENOUS | Status: AC
  Administered 2020-01-13: 10:00:00 1000 mL via INTRAVENOUS

## 2020-01-13 NOTE — ED Triage Notes (Signed)
C/O low back pain and foul smelling urine x 3 days.

## 2020-01-13 NOTE — ED Provider Notes (Signed)
[facility] Emergency Department Provider Note  ____________________________________________   Event Date/Time   First MD Initiated Contact with Patient 01/13/20 [phone]     (approximate)  I have reviewed the triage vital signs and the nursing notes.   HISTORY  Chief Complaint Back Pain   HPI Brandy Robinson is a 54 y.o. female with past medical history of CHF, endometriosis, anxiety, bipolar disorder, insomnia, and vaginal atrophy presents for assessment of proximately 3 days of some achiness in her bilateral lower back associate with some bladder fullness dysuria and foul-smelling urine.  Patient denies any lower extremity weakness, incontinence, rash, vaginal bleeding or discharge, fevers, chills, chest pain, cough, shortness of breath or recent injuries or falls.  She does not have any abdominal pain or recent vomiting or diarrhea.  She has had several urinary tract infections in the past and this feels very similar.  No clear alleviating or aggravating factors.         Past Medical History:  Diagnosis Date  . Anxiety   . Bipolar disorder (HCC)   . CHF (congestive heart failure) (HCC)   . Depression   . Disc disorder   . Dyspareunia   . Endometriosis   . GERD (gastroesophageal reflux disease)   . Insomnia   . Lichen   . Surgical menopause   . Vaginal atrophy   . Wears glasses     Patient Active Problem List   Diagnosis Date Noted  . Encounter for medication management 10/08/2018  . SUI (stress urinary incontinence, female) 05/19/2017  . Generalized anxiety disorder 03/21/2017  . CAP (community acquired pneumonia) 03/18/2017  . Acute respiratory failure (HCC)   . Perforated viscus 12/30/2014  . Lichen sclerosus 09/24/2014  . Surgical menopause 09/24/2014  . Endometriosis 09/24/2014  . Bipolar 1 disorder (HCC) 09/24/2014  . Insomnia 09/24/2014  . GERD (gastroesophageal reflux disease) 09/24/2014  . History of stomach ulcers 09/24/2014     Past Surgical History:  Procedure Laterality Date  . ABDOMINAL HYSTERECTOMY  1995   tah-bso  . ARM WOUND REPAIR / CLOSURE  2013   cellulitis rt arm-i/d  . KNEE BURSECTOMY Left 09/24/2013   Procedure: IRRIGATION AND DEBRIDEMENT OF LEFT KNEE ;  Surgeon: Sheral Apleyimothy D Murphy, MD;  Location: [facility];  Service: Orthopedics;  Laterality: Left;  . LAPAROTOMY N/A 12/30/2014   Procedure: EXPLORATORY LAPAROTOMY-graham patch of peptic ulcer;  Surgeon: Ricarda Frameharles Woodham, MD;  Location: [facility];  Service: General;  Laterality: N/A;    Prior to Admission medications   Medication Sig Start Date End Date Taking? Authorizing Provider  cephALEXin (KEFLEX) 500 MG capsule Take 1 capsule (500 mg total) by mouth 4 (four) times daily for 7 days. 01/13/20 01/20/20 Yes Gilles ChiquitoSmith, Malaijah Houchen P, MD  albuterol (PROVENTIL HFA;VENTOLIN HFA) 108 (90 Base) MCG/ACT inhaler Inhale 2 puffs into the lungs every 6 (six) hours as needed for wheezing or shortness of breath. 03/18/18   Emily FilbertWilliams, Jonathan E, MD  atorvastatin (LIPITOR) 20 MG tablet Take 20 mg by mouth at bedtime.    [provider]  buPROPion (WELLBUTRIN SR) 150 MG 12 hr tablet Take 150 mg by mouth daily. At 6 am.    [provider]  busPIRone (BUSPAR) 10 MG tablet Take 2 tablets (20 mg total) by mouth 2 (two) times daily. 03/25/17   Ramonita LabGouru, Aruna, MD  carvedilol (COREG) 12.5 MG tablet Take 1 tablet (12.5 mg total) by mouth 2 (two) times daily with a meal. Patient not taking:  Reported on 09/17/2019 03/25/17   Ramonita Lab, MD  clonazePAM (KLONOPIN) 0.5 MG disintegrating tablet Take 1 tablet (0.5 mg total) by mouth at bedtime. Patient taking differently: Take 0.5 mg by mouth at bedtime as needed.  03/25/17   Ramonita Lab, MD  conjugated estrogens (PREMARIN) vaginal cream Place 0.25 Applicatorfuls vaginally 2 (two) times a week. 05/18/17   Defrancesco, Prentice Docker, MD  cyclobenzaprine (FLEXERIL) 10 MG tablet Take 10 mg by mouth at bedtime as needed for  muscle spasms.    [provider]  DULoxetine (CYMBALTA) 30 MG capsule Take 1 capsule (30 mg total) by mouth 3 (three) times daily. Patient taking differently: Take 30 mg by mouth at bedtime. 2 capsules in the evening 03/25/17   Gouru, Deanna Artis, MD  estradiol (ESTRACE) 1 MG tablet Take 1 tablet (1 mg total) by mouth daily. 09/19/19   Linzie Collin, MD  fluconazole (DIFLUCAN) 150 MG tablet Take 1 tablet (150 mg total) by mouth daily. 11/06/19   Linzie Collin, MD  Fluticasone-Salmeterol (ADVAIR) 100-50 MCG/DOSE AEPB Inhale 1 puff into the lungs as needed (shortness of breath).    [provider]  lisinopril (PRINIVIL,ZESTRIL) 5 MG tablet Take 5 mg by mouth every evening.    [provider]  metroNIDAZOLE (FLAGYL) 500 MG tablet Take 1 tablet (500 mg total) by mouth 2 (two) times daily. 11/06/19   Linzie Collin, MD  ondansetron (ZOFRAN-ODT) 4 MG disintegrating tablet Take 4 mg by mouth every 8 (eight) hours as needed for nausea or vomiting.    [provider]  pantoprazole (PROTONIX) 40 MG tablet Take 1 tablet (40 mg total) by mouth daily. 05/13/15   Gladis Riffle, MD  QUEtiapine (SEROQUEL) 400 MG tablet Take 400 mg by mouth at bedtime.    [provider]    Allergies Hydrocodone, Aspirin, and Morphine and related  Family History  Problem Relation Age of Onset  . Diabetes Mother   . Diabetes Sister   . Cancer Father        Throat  . Heart disease Neg Hx   . Breast cancer Neg Hx   . Ovarian cancer Neg Hx   . Colon cancer Neg Hx     Social History Social History   Tobacco Use  . Smoking status: Current Some Day Smoker    Packs/day: 1.00    Types: Cigarettes  . Smokeless tobacco: Never Used  . Tobacco comment: 1800quit now  Vaping Use  . Vaping Use: Never used  Substance Use Topics  . Alcohol use: No  . Drug use: No    Review of Systems  Review of Systems  Constitutional: Positive for malaise/fatigue. Negative for  chills and fever.  HENT: Negative for sore throat.   Eyes: Negative for pain.  Respiratory: Negative for cough and stridor.   Cardiovascular: Negative for chest pain.  Gastrointestinal: Negative for vomiting.  Genitourinary: Positive for dysuria and frequency.  Musculoskeletal: Positive for back pain.  Skin: Negative for rash.  Neurological: Negative for seizures, loss of consciousness and headaches.  Psychiatric/Behavioral: Negative for suicidal ideas.  All other systems reviewed and are negative.     ____________________________________________   PHYSICAL EXAM:  VITAL SIGNS: ED Triage Vitals  Enc Vitals Group     BP 01/13/20 0922 (!) 152/86     Pulse Rate 01/13/20 0922 (!) 108     Resp 01/13/20 0922 16     Temp 01/13/20 0922 98.4 F (36.9 C)     Temp  Source 01/13/20 0922 Oral     SpO2 01/13/20 0922 97 %     Weight 01/13/20 0923 183 lb 13.8 oz (83.4 kg)     Height 01/13/20 0923 5\' 3"  (1.6 m)     Head Circumference --      Peak Flow --      Pain Score 01/13/20 0922 6     Pain Loc --      Pain Edu? --      Excl. in GC? --    Vitals:   01/13/20 0922  BP: (!) 152/86  Pulse: (!) 108  Resp: 16  Temp: 98.4 F (36.9 C)  SpO2: 97%   Physical Exam Vitals and nursing note reviewed.  Constitutional:      General: She is not in acute distress.    Appearance: She is well-developed and well-nourished.  HENT:     Head: Normocephalic and atraumatic.     Right Ear: External ear normal.     Left Ear: External ear normal.     Nose: Nose normal.  Eyes:     Conjunctiva/sclera: Conjunctivae normal.  Cardiovascular:     Rate and Rhythm: Regular rhythm. Tachycardia present.     Heart sounds: No murmur heard.   Pulmonary:     Effort: Pulmonary effort is normal. No respiratory distress.     Breath sounds: Normal breath sounds.  Abdominal:     Palpations: Abdomen is soft.     Tenderness: There is no abdominal tenderness. There is no right CVA tenderness or left CVA  tenderness.  Musculoskeletal:        General: No edema.     Cervical back: Neck supple.  Skin:    General: Skin is warm and dry.     Capillary Refill: Capillary refill takes 2 to 3 seconds.  Neurological:     Mental Status: She is alert and oriented to person, place, and time.  Psychiatric:        Mood and Affect: Mood and affect and mood normal.      ____________________________________________   LABS (all labs ordered are listed, but only abnormal results are displayed)  Labs Reviewed  COMPREHENSIVE METABOLIC PANEL - Abnormal; Notable for the following components:      Result Value   Sodium 132 (*)    Chloride 97 (*)    Glucose, Bld 197 (*)    Calcium 8.8 (*)    Alkaline Phosphatase 143 (*)    All other components within normal limits  URINALYSIS, COMPLETE (UACMP) WITH MICROSCOPIC - Abnormal; Notable for the following components:   Color, Urine YELLOW (*)    APPearance CLOUDY (*)    Protein, ur 30 (*)    Leukocytes,Ua LARGE (*)    Bacteria, UA FEW (*)    All other components within normal limits  URINE CULTURE  CBC   ____________________________________________  ____________________________________________  RADIOLOGY  ED MD interpretation: No kidney stone, perinephric stranding, diverticulitis, or other acute intra-abdominal pelvic pathology.   Official radiology report(s): CT ABDOMEN PELVIS WO CONTRAST  Result Date: 01/13/2020 CLINICAL DATA:  Flank pain. EXAM: CT ABDOMEN AND PELVIS WITHOUT CONTRAST TECHNIQUE: Multidetector CT imaging of the abdomen and pelvis was performed following the standard protocol without IV contrast. COMPARISON:  February 07, 2017. FINDINGS: Lower chest: No acute abnormality. Hepatobiliary: No gallstones or biliary dilatation is noted. Hepatic steatosis is noted. Pancreas: Unremarkable. No pancreatic ductal dilatation or surrounding inflammatory changes. Spleen: Normal in size without focal abnormality. Adrenals/Urinary Tract: Adrenal  glands are  unremarkable. Kidneys are normal, without renal calculi, focal lesion, or hydronephrosis. Bladder is unremarkable. Stomach/Bowel: Stomach is within normal limits. Appendix appears normal. No evidence of bowel wall thickening, distention, or inflammatory changes. Vascular/Lymphatic: Aortic atherosclerosis. No enlarged abdominal or pelvic lymph nodes. Reproductive: Status post hysterectomy. No adnexal masses. Other: No abdominal wall hernia or abnormality. No abdominopelvic ascites. Musculoskeletal: No acute or significant osseous findings. IMPRESSION: 1. Hepatic steatosis. 2. Aortic atherosclerosis. 3. No acute abnormality seen in the abdomen or pelvis. Aortic Atherosclerosis (ICD10-I70.0). Electronically Signed   By: Lupita Raider M.D.   On: 01/13/2020 10:14    ____________________________________________   PROCEDURES  Procedure(s) performed (including Critical Care):  Procedures   ____________________________________________   INITIAL IMPRESSION / ASSESSMENT AND PLAN / ED COURSE      Patient presents above-stated exam for proximally 3 days of some bilateral lower back achiness as well as some foul-smelling urine dysuria and frequency.  She is slightly tachycardic and slightly hypertensive otherwise stable vital signs on arrival.  Abdomen is soft nontender throughout and she is no CVA tenderness.  Primary differential) not limited to uncomplicated cystitis, pyelonephritis, kidney stone, diverticulitis, appendicitis, MSK pain, and GU pathology.  No pelvic pain vaginal bleeding or discharge to suggest acute GU pathology.  Urine does appear acutely infected I have a low suspicion for pyonephritis given absence of perinephric stranding fever elevated white blood cell count.  CT abdomen pelvis shows no evidence of stone or other acute intra-abdominal pelvic pathology.  CMP with mild hyperglycemia with a glucose of 197 with no other significant derangements.  CBC  unremarkable.   ____________________________________________   FINAL CLINICAL IMPRESSION(S) / ED DIAGNOSES  Final diagnoses:  Acute cystitis with hematuria  Mild dehydration    Medications  acetaminophen (TYLENOL) tablet 1,000 mg (has no administration in time range)  cefTRIAXone (ROCEPHIN) 1 g in sodium chloride 0.9 % 100 mL IVPB (has no administration in time range)  ketorolac (TORADOL) 30 MG/ML injection 30 mg (has no administration in time range)  sodium chloride 0.9 % bolus 1,000 mL (has no administration in time range)     ED Discharge Orders         Ordered    cephALEXin (KEFLEX) 500 MG capsule  4 times daily        01/13/20 1023           Note:  This document was prepared using Dragon voice recognition software and may include unintentional dictation errors.   Gilles Chiquito, MD 01/13/20 1028

## 2020-01-14 LAB — URINE CULTURE: Culture: <10000 — AB

## 2020-04-01 ENCOUNTER — Other Ambulatory Visit: Payer: Self-pay | Admitting: Psychiatry

## 2020-04-19 ENCOUNTER — Other Ambulatory Visit: Payer: Self-pay

## 2020-04-20 ENCOUNTER — Other Ambulatory Visit: Payer: Self-pay

## 2020-04-24 ENCOUNTER — Other Ambulatory Visit: Payer: Self-pay

## 2020-04-24 MED FILL — Buspirone HCl Tab 10 MG: ORAL | 120 days supply | Qty: 120 | Fill #0 | Status: AC

## 2020-04-27 ENCOUNTER — Other Ambulatory Visit: Payer: Self-pay

## 2020-04-27 MED FILL — Quetiapine Fumarate Tab 50 MG: ORAL | 30 days supply | Qty: 30 | Fill #0 | Status: AC

## 2020-04-27 MED FILL — Duloxetine HCl Enteric Coated Pellets Cap 60 MG (Base Eq): ORAL | 30 days supply | Qty: 60 | Fill #0 | Status: AC

## 2020-04-27 MED FILL — Bupropion HCl Tab ER 12HR 150 MG: ORAL | 30 days supply | Qty: 30 | Fill #0 | Status: AC

## 2020-05-11 ENCOUNTER — Other Ambulatory Visit: Payer: Self-pay

## 2020-05-11 MED FILL — Pantoprazole Sodium EC Tab 40 MG (Base Equiv): ORAL | 30 days supply | Qty: 30 | Fill #0 | Status: AC

## 2020-05-11 MED FILL — Estradiol Tab 1 MG: ORAL | 30 days supply | Qty: 30 | Fill #0 | Status: AC

## 2020-05-25 ENCOUNTER — Other Ambulatory Visit: Payer: Self-pay

## 2020-05-25 MED FILL — Buspirone HCl Tab 10 MG: ORAL | 30 days supply | Qty: 120 | Fill #1 | Status: AC

## 2020-06-07 MED FILL — Bupropion HCl Tab ER 12HR 150 MG: ORAL | 30 days supply | Qty: 30 | Fill #1 | Status: AC

## 2020-06-07 MED FILL — Estradiol Tab 1 MG: ORAL | 30 days supply | Qty: 30 | Fill #1 | Status: AC

## 2020-06-08 ENCOUNTER — Other Ambulatory Visit: Payer: Self-pay

## 2020-06-10 ENCOUNTER — Other Ambulatory Visit: Payer: Self-pay

## 2020-06-11 ENCOUNTER — Other Ambulatory Visit: Payer: Self-pay

## 2020-06-16 MED FILL — Quetiapine Fumarate Tab 50 MG: ORAL | 30 days supply | Qty: 30 | Fill #1 | Status: AC

## 2020-06-16 MED FILL — Pantoprazole Sodium EC Tab 40 MG (Base Equiv): ORAL | 30 days supply | Qty: 30 | Fill #1 | Status: AC

## 2020-06-17 ENCOUNTER — Other Ambulatory Visit: Payer: Self-pay

## 2020-07-01 ENCOUNTER — Other Ambulatory Visit: Payer: Self-pay

## 2020-07-01 MED ORDER — DULOXETINE HCL 60 MG PO CPEP
60.0000 mg | ORAL_CAPSULE | Freq: Two times a day (BID) | ORAL | 2 refills | Status: DC
  Filled 2020-07-01: qty 60, 30d supply, fill #0
  Filled 2020-07-15 – 2020-08-03 (×2): qty 60, 30d supply, fill #1

## 2020-07-01 MED ORDER — QUETIAPINE FUMARATE 50 MG PO TABS
ORAL_TABLET | ORAL | 2 refills | Status: DC
  Filled 2020-07-01: qty 30, 30d supply, fill #0
  Filled 2020-07-13 – 2020-07-14 (×2): qty 30, fill #0
  Filled 2020-07-15: qty 30, 30d supply, fill #0
  Filled 2020-08-03 – 2020-08-11 (×3): qty 30, 30d supply, fill #1
  Filled 2020-09-11: qty 30, 30d supply, fill #2

## 2020-07-01 MED ORDER — BUPROPION HCL ER (SR) 150 MG PO TB12
150.0000 mg | ORAL_TABLET | Freq: Every morning | ORAL | 2 refills | Status: DC
Start: 2020-07-01 — End: 2020-09-30
  Filled 2020-07-01 – 2020-07-15 (×4): qty 30, 30d supply, fill #0
  Filled 2020-08-03 – 2020-08-11 (×3): qty 30, 30d supply, fill #1
  Filled 2020-09-07: qty 30, 30d supply, fill #2

## 2020-07-01 MED ORDER — BUSPIRONE HCL 10 MG PO TABS
ORAL_TABLET | ORAL | 2 refills | Status: DC
  Filled 2020-07-01: qty 90, 30d supply, fill #0
  Filled 2020-07-13: qty 90, fill #0
  Filled 2020-07-15: qty 90, 30d supply, fill #0
  Filled 2020-08-03 – 2020-08-11 (×3): qty 90, 30d supply, fill #1
  Filled 2020-09-11: qty 90, 30d supply, fill #2

## 2020-07-02 ENCOUNTER — Other Ambulatory Visit: Payer: Self-pay

## 2020-07-02 MED ORDER — FLUCONAZOLE 150 MG PO TABS
ORAL_TABLET | ORAL | 0 refills | Status: DC
  Filled 2020-07-02: qty 2, 3d supply, fill #0

## 2020-07-02 MED ORDER — SULFAMETHOXAZOLE-TRIMETHOPRIM 800-160 MG PO TABS
ORAL_TABLET | ORAL | 0 refills | Status: DC
  Filled 2020-07-02: qty 14, 7d supply, fill #0

## 2020-07-11 MED FILL — Estradiol Tab 1 MG: ORAL | 30 days supply | Qty: 30 | Fill #2 | Status: CN

## 2020-07-13 ENCOUNTER — Other Ambulatory Visit: Payer: Self-pay

## 2020-07-13 ENCOUNTER — Telehealth: Payer: Self-pay | Admitting: Pharmacy Technician

## 2020-07-13 MED FILL — Pantoprazole Sodium EC Tab 40 MG (Base Equiv): ORAL | 30 days supply | Qty: 30 | Fill #2 | Status: CN

## 2020-07-13 MED FILL — Buspirone HCl Tab 10 MG: ORAL | 30 days supply | Qty: 120 | Fill #2 | Status: CN

## 2020-07-13 NOTE — Telephone Encounter (Signed)
Patient failed to provide 2022 proof of income.  No additional medication assistance will be provided by [facility] without the required proof of income documentation.  Patient notified by letter.  Sherilyn Dacosta Care Manager [facility]    [address], [place]  [address]   July 13, 2020    Shanequia Kendrick [address] [place], [place]  [address]  Dear Cala Bradford:  This is to inform you that you are no longer eligible to receive medication assistance at [facility].  The reason(s) are:    _____Your total gross monthly household income exceeds 250% of the Federal Poverty Level.   _____Tangible assets (savings, checking, stocks/bonds, pension, retirement, etc.) exceeds our limit  _____You are eligible to receive benefits from [facility], [facility] or HIV Medication              Assistance Program _____You are eligible to receive benefits from a Medicare Part "D" plan _____You have prescription insurance  _____You are not an [facility] resident __X__Failure to provide all requested documentation.  Still need last 30 days paystubs, 2021 Federal Tax Return.and a current bill showing your address.    Medication assistance will resume once all requested documentation has been returned to our clinic.  If you have questions, please contact our clinic at [phone].    Thank you,  [facility]

## 2020-07-14 ENCOUNTER — Other Ambulatory Visit: Payer: Self-pay

## 2020-07-14 MED FILL — Duloxetine HCl Enteric Coated Pellets Cap 60 MG (Base Eq): ORAL | 30 days supply | Qty: 60 | Fill #1 | Status: CN

## 2020-07-14 MED FILL — Buspirone HCl Tab 10 MG: ORAL | 30 days supply | Qty: 120 | Fill #2 | Status: CN

## 2020-07-14 MED FILL — Estradiol Tab 1 MG: ORAL | 30 days supply | Qty: 30 | Fill #2 | Status: AC

## 2020-07-14 MED FILL — Pantoprazole Sodium EC Tab 40 MG (Base Equiv): ORAL | 30 days supply | Qty: 30 | Fill #2 | Status: CN

## 2020-07-15 ENCOUNTER — Other Ambulatory Visit: Payer: Self-pay

## 2020-07-15 ENCOUNTER — Telehealth: Payer: Self-pay | Admitting: Pharmacist

## 2020-07-15 MED FILL — Pantoprazole Sodium EC Tab 40 MG (Base Equiv): ORAL | 30 days supply | Qty: 30 | Fill #2 | Status: AC

## 2020-07-15 MED FILL — Estradiol Tab 1 MG: ORAL | 30 days supply | Qty: 30 | Fill #3 | Status: CN

## 2020-07-15 NOTE — Telephone Encounter (Signed)
Patient approved for medication assistance at [facility] until 04/17/21, as long as eligibility criteria continues to be met.   Vonda Henderson [facility] Administrative Assistant 

## 2020-07-23 ENCOUNTER — Other Ambulatory Visit: Payer: Self-pay

## 2020-08-03 ENCOUNTER — Other Ambulatory Visit: Payer: Self-pay

## 2020-08-03 MED FILL — Pantoprazole Sodium EC Tab 40 MG (Base Equiv): ORAL | 30 days supply | Qty: 30 | Fill #3 | Status: CN

## 2020-08-03 MED FILL — Estradiol Tab 1 MG: ORAL | 30 days supply | Qty: 30 | Fill #3 | Status: CN

## 2020-08-05 ENCOUNTER — Other Ambulatory Visit: Payer: Self-pay

## 2020-08-05 MED FILL — Estradiol Tab 1 MG: ORAL | 30 days supply | Qty: 30 | Fill #3 | Status: AC

## 2020-08-05 MED FILL — Pantoprazole Sodium EC Tab 40 MG (Base Equiv): ORAL | 30 days supply | Qty: 30 | Fill #3 | Status: CN

## 2020-08-06 ENCOUNTER — Other Ambulatory Visit: Payer: Self-pay

## 2020-08-11 ENCOUNTER — Other Ambulatory Visit: Payer: Self-pay

## 2020-08-11 MED FILL — Pantoprazole Sodium EC Tab 40 MG (Base Equiv): ORAL | 30 days supply | Qty: 30 | Fill #3 | Status: AC

## 2020-08-13 ENCOUNTER — Other Ambulatory Visit: Payer: Self-pay

## 2020-09-07 MED FILL — Estradiol Tab 1 MG: ORAL | 30 days supply | Qty: 30 | Fill #4 | Status: AC

## 2020-09-08 ENCOUNTER — Other Ambulatory Visit: Payer: Self-pay

## 2020-09-11 ENCOUNTER — Other Ambulatory Visit: Payer: Self-pay

## 2020-09-14 ENCOUNTER — Other Ambulatory Visit: Payer: Self-pay

## 2020-09-15 ENCOUNTER — Other Ambulatory Visit: Payer: Self-pay

## 2020-09-16 ENCOUNTER — Other Ambulatory Visit: Payer: Self-pay

## 2020-09-16 MED ORDER — PANTOPRAZOLE SODIUM 40 MG PO TBEC
DELAYED_RELEASE_TABLET | ORAL | 6 refills | Status: DC
  Filled 2020-09-16: qty 30, 30d supply, fill #0
  Filled 2020-10-02 – 2020-10-14 (×3): qty 30, 30d supply, fill #1
  Filled 2020-11-10: qty 30, 30d supply, fill #2
  Filled 2020-12-06: qty 30, 30d supply, fill #3
  Filled 2021-01-04: qty 30, 30d supply, fill #4
  Filled 2021-02-07: qty 30, 30d supply, fill #5
  Filled 2021-03-09: qty 30, 30d supply, fill #6
  Filled 2021-04-06: qty 30, 30d supply, fill #7
  Filled 2021-05-04: qty 30, 30d supply, fill #8
  Filled 2021-06-01: qty 30, 30d supply, fill #9
  Filled 2021-07-13: qty 30, 30d supply, fill #0

## 2020-09-30 ENCOUNTER — Other Ambulatory Visit: Payer: Self-pay

## 2020-09-30 MED ORDER — QUETIAPINE FUMARATE 50 MG PO TABS
ORAL_TABLET | ORAL | 2 refills | Status: DC
  Filled 2020-10-14: qty 30, 30d supply, fill #0
  Filled 2020-11-10: qty 30, 30d supply, fill #1
  Filled 2020-12-06: qty 30, 30d supply, fill #2

## 2020-09-30 MED ORDER — DULOXETINE HCL 60 MG PO CPEP
60.0000 mg | ORAL_CAPSULE | Freq: Two times a day (BID) | ORAL | 2 refills | Status: DC
  Filled 2020-09-30: qty 60, 30d supply, fill #0

## 2020-09-30 MED ORDER — BUPROPION HCL ER (SR) 150 MG PO TB12
150.0000 mg | ORAL_TABLET | Freq: Every morning | ORAL | 2 refills | Status: DC
  Filled 2020-09-30 – 2020-10-08 (×2): qty 30, 30d supply, fill #0
  Filled 2020-11-10: qty 30, 30d supply, fill #1
  Filled 2020-12-06: qty 30, 30d supply, fill #2

## 2020-09-30 MED ORDER — BUSPIRONE HCL 10 MG PO TABS
ORAL_TABLET | ORAL | 2 refills | Status: DC
  Filled 2020-10-14: qty 90, 30d supply, fill #0
  Filled 2020-11-10: qty 90, 30d supply, fill #1
  Filled 2020-12-06: qty 90, 30d supply, fill #2

## 2020-10-02 ENCOUNTER — Other Ambulatory Visit: Payer: Self-pay

## 2020-10-02 ENCOUNTER — Other Ambulatory Visit: Payer: Self-pay | Admitting: Obstetrics and Gynecology

## 2020-10-02 DIAGNOSIS — Z01419 Encounter for gynecological examination (general) (routine) without abnormal findings: Secondary | ICD-10-CM

## 2020-10-02 MED ORDER — ESTRADIOL 1 MG PO TABS
ORAL_TABLET | Freq: Every day | ORAL | 0 refills | Status: DC
  Filled 2020-10-02: qty 90, fill #0
  Filled 2020-10-08: qty 30, 30d supply, fill #0
  Filled 2020-11-10: qty 30, 30d supply, fill #1
  Filled 2020-12-06: qty 30, 30d supply, fill #2

## 2020-10-09 ENCOUNTER — Other Ambulatory Visit: Payer: Self-pay

## 2020-10-11 ENCOUNTER — Other Ambulatory Visit: Payer: Self-pay

## 2020-10-12 ENCOUNTER — Other Ambulatory Visit: Payer: Self-pay

## 2020-10-13 ENCOUNTER — Other Ambulatory Visit: Payer: Self-pay

## 2020-10-14 ENCOUNTER — Other Ambulatory Visit: Payer: Self-pay

## 2020-10-15 ENCOUNTER — Other Ambulatory Visit: Payer: Self-pay

## 2020-10-19 ENCOUNTER — Other Ambulatory Visit: Payer: Self-pay | Admitting: Family Medicine

## 2020-10-19 ENCOUNTER — Other Ambulatory Visit: Payer: Self-pay

## 2020-10-21 ENCOUNTER — Other Ambulatory Visit: Payer: Self-pay

## 2020-11-11 ENCOUNTER — Other Ambulatory Visit: Payer: Self-pay

## 2020-11-20 ENCOUNTER — Other Ambulatory Visit: Payer: Self-pay

## 2020-11-25 ENCOUNTER — Other Ambulatory Visit: Payer: Self-pay

## 2020-11-25 MED ORDER — ONDANSETRON 4 MG PO TBDP
ORAL_TABLET | ORAL | 3 refills | Status: DC
  Filled 2020-11-25: qty 30, 5d supply, fill #0
  Filled 2021-02-07: qty 30, 5d supply, fill #1
  Filled 2021-04-06: qty 30, 5d supply, fill #2
  Filled 2021-04-28: qty 30, 5d supply, fill #3

## 2020-11-27 ENCOUNTER — Other Ambulatory Visit: Payer: Self-pay

## 2020-11-27 MED ORDER — FLUCONAZOLE 150 MG PO TABS
ORAL_TABLET | ORAL | 0 refills | Status: DC
  Filled 2020-11-27: qty 2, 3d supply, fill #0

## 2020-11-27 MED ORDER — SULFAMETHOXAZOLE-TRIMETHOPRIM 800-160 MG PO TABS
ORAL_TABLET | ORAL | 0 refills | Status: DC
  Filled 2020-11-27: qty 14, 7d supply, fill #0

## 2020-12-07 ENCOUNTER — Other Ambulatory Visit: Payer: Self-pay

## 2020-12-14 ENCOUNTER — Ambulatory Visit: Payer: Self-pay | Admitting: Pharmacist

## 2020-12-14 ENCOUNTER — Other Ambulatory Visit: Payer: Self-pay

## 2020-12-14 ENCOUNTER — Encounter: Payer: Self-pay | Admitting: Pharmacist

## 2020-12-14 DIAGNOSIS — Z79899 Other long term (current) drug therapy: Secondary | ICD-10-CM

## 2020-12-14 NOTE — Progress Notes (Signed)
[facility] Visit Note  Patient: Brandy Robinson MRN: [id number] Date of Birth: 05-Feb-1965 PCP: Brandy Sander, MD   Brandy Robinson 55 y.o. female contacted via telephone for a medication therapy review visit today. The visit was brief as she was on her lunch break. She was identified by name and DOB.  There were no vitals taken for this visit.  Patient Information   Past Medical History:  Diagnosis Date   Anxiety    Bipolar disorder (HCC)    CHF (congestive heart failure) (HCC)    Depression    Disc disorder    Dyspareunia    Endometriosis    GERD (gastroesophageal reflux disease)    Insomnia    Lichen    Surgical menopause    Vaginal atrophy    Wears glasses       Past Surgical History:  Procedure Laterality Date   ABDOMINAL HYSTERECTOMY  1995   tah-bso   ARM WOUND REPAIR / CLOSURE  2013   cellulitis rt arm-i/d   KNEE BURSECTOMY Left 09/24/2013   Procedure: IRRIGATION AND DEBRIDEMENT OF LEFT KNEE ;  Surgeon: Sheral Apley, MD;  Location: [facility];  Service: Orthopedics;  Laterality: Left;   LAPAROTOMY N/A 12/30/2014   Procedure: EXPLORATORY LAPAROTOMY-graham patch of peptic ulcer;  Surgeon: Ricarda Frame, MD;  Location: [facility];  Service: General;  Laterality: N/A;     Family History  Problem Relation Age of Onset   Diabetes Mother    Diabetes Sister    Cancer Father        Throat   Heart disease Neg Hx    Breast cancer Neg Hx    Ovarian cancer Neg Hx    Colon cancer Neg Hx     New Diagnoses (since last visit):   Family Support:           Social History   Substance and Sexual Activity  Alcohol Use No      Social History   Tobacco Use  Smoking Status Some Days   Packs/day: 1.00   Types: Cigarettes  Smokeless Tobacco Former   Quit date: 03/17/2020  Tobacco Comments   1800quit now      Health Maintenance  Topic Date Due   COVID-19 Vaccine (1) Never done   Pneumococcal Vaccine 34-52 Years old (1  - PCV) Never done   Hepatitis C Screening  Never done   TETANUS/TDAP  Never done   Zoster Vaccines- Shingrix (1 of 2) Never done   PAP SMEAR-Modifier  Never done   COLONOSCOPY (Pts 45-48yrs Insurance coverage will need to be confirmed)  Never done   INFLUENZA VACCINE  Never done   MAMMOGRAM  10/21/2021   HIV Screening  Completed   HPV VACCINES  Aged Out   Health Maintenance/Date Completed  Last Visit to PCP: about 3 months ago  Next Visit to PCP: Patient to schedule Pelvic/PAP Exam: None recent Mammogram: Due to be done Colonoscopy: 8 years ago Flu Vaccine: No Pneumonia Vaccine: No COVID-19 Vaccine: 2 vaccines 2021 Shingrix Vaccine: No  Outpatient Encounter Medications as of 12/14/2020  Medication Sig   buPROPion (WELLBUTRIN SR) 150 MG 12 hr tablet Take 1 tablet (150 mg total) by mouth once daily in the morning.   busPIRone (BUSPAR) 10 MG tablet TAKE ONE TABLET BY MOUTH ONCE EVERY MORNING AND TAKE 2 TABLETS BY MOUTH ONCE EVERY EVENING.   clonazePAM (KLONOPIN) 0.5 MG disintegrating tablet Take 1 tablet (0.5 mg total) by mouth  at bedtime. (Patient taking differently: Take 0.5 mg by mouth at bedtime as needed.)   DULoxetine (CYMBALTA) 60 MG capsule Take 1 capsule (60 mg total) by mouth twice daily. (Patient taking differently: Take 60 mg by mouth in the morning and at bedtime. Taking 2 capsules at bedtime)   estradiol (ESTRACE) 1 MG tablet TAKE ONE TABLET BY MOUTH ONCE EVERY DAY.   ondansetron (ZOFRAN-ODT) 4 MG disintegrating tablet Dissolve 1-2 tablets by mouth once every 8 hours as needed for nausea.   pantoprazole (PROTONIX) 40 MG tablet TAKE ONE TABLET BY MOUTH EVERY DAY FOR REFLUX OR HEARTBURN.   QUEtiapine (SEROQUEL) 50 MG tablet TAKE ONE TABLET BY MOUTH ONCE DAILY AT BEDTIME.   [DISCONTINUED] buPROPion (WELLBUTRIN SR) 150 MG 12 hr tablet Take 150 mg by mouth daily. At 6 am.   [DISCONTINUED] busPIRone (BUSPAR) 10 MG tablet Take 2 tablets (20 mg total) by mouth 2 (two) times  daily.   [DISCONTINUED] ondansetron (ZOFRAN-ODT) 4 MG disintegrating tablet Take 4 mg by mouth every 8 (eight) hours as needed for nausea or vomiting.   [DISCONTINUED] pantoprazole (PROTONIX) 40 MG tablet Take 1 tablet (40 mg total) by mouth daily.   albuterol (PROVENTIL HFA;VENTOLIN HFA) 108 (90 Base) MCG/ACT inhaler Inhale 2 puffs into the lungs every 6 (six) hours as needed for wheezing or shortness of breath. (Patient not taking: Reported on 12/14/2020)   conjugated estrogens (PREMARIN) vaginal cream Place 0.25 Applicatorfuls vaginally 2 (two) times a week. (Patient not taking: Reported on 12/14/2020)   cyclobenzaprine (FLEXERIL) 10 MG tablet Take 10 mg by mouth at bedtime as needed for muscle spasms. (Patient not taking: Reported on 12/14/2020)   Fluticasone-Salmeterol (ADVAIR) 100-50 MCG/DOSE AEPB Inhale 1 puff into the lungs as needed (shortness of breath). (Patient not taking: Reported on 12/14/2020)   [DISCONTINUED] atorvastatin (LIPITOR) 20 MG tablet Take 20 mg by mouth at bedtime.   [DISCONTINUED] carvedilol (COREG) 12.5 MG tablet Take 1 tablet (12.5 mg total) by mouth 2 (two) times daily with a meal. (Patient not taking: Reported on 09/17/2019)   [DISCONTINUED] cephALEXin (KEFLEX) 500 MG capsule TAKE ONE CAPSULE BY MOUTH FOUR TIMES A DAY FOR 7 DAYS   [DISCONTINUED] DULoxetine (CYMBALTA) 30 MG capsule Take 1 capsule (30 mg total) by mouth 3 (three) times daily. (Patient taking differently: Take 30 mg by mouth at bedtime. 2 capsules in the evening)   [DISCONTINUED] lisinopril (PRINIVIL,ZESTRIL) 5 MG tablet Take 5 mg by mouth every evening.   [DISCONTINUED] metroNIDAZOLE (FLAGYL) 500 MG tablet Take 1 tablet (500 mg total) by mouth 2 (two) times daily.   [DISCONTINUED] QUEtiapine (SEROQUEL) 400 MG tablet Take 400 mg by mouth at bedtime.   No facility-administered encounter medications on file as of 12/14/2020.    Assessment and Plan:  CHF:  Unclear if patient should be taking medications  for CHF. Previously on furosemide, carvedilol and lisinopril. Patient states she has not taken these medications in over 2 year. Encouraged her to discuss with her primary care provider.  Respiratory:  Quit smoking about 6 months ago; occasionally vapes. Patient had previous prescriptions for Albuterol MDI and Advair. She states that she needs these when she starts coughing. Patient did not confirm having Asthma or COPD. Not able to see notes from [facility].  GERD: Taking pantoprazole daily. Denies any issues. Also taking ondansetron as needed for nausea/vomiting.   Depression/Anxiety: Currently taking bupropion, buspirone, clonazepam, duloxetine and quetiapine. Duloxetine prescribed 60 mg twice daily. Taking 120mg  at bedtime. States this regimen is  working for her mood.   RTC 1 year  Yuri Flener K. Joelene Millin, PharmD [facility] Clinic-Pharmacy Operations Coordinator [phone]

## 2020-12-18 ENCOUNTER — Other Ambulatory Visit: Payer: Self-pay

## 2020-12-23 ENCOUNTER — Other Ambulatory Visit: Payer: Self-pay

## 2020-12-30 ENCOUNTER — Other Ambulatory Visit: Payer: Self-pay

## 2021-01-04 ENCOUNTER — Other Ambulatory Visit: Payer: Self-pay | Admitting: Obstetrics and Gynecology

## 2021-01-04 ENCOUNTER — Other Ambulatory Visit: Payer: Self-pay

## 2021-01-04 DIAGNOSIS — Z01419 Encounter for gynecological examination (general) (routine) without abnormal findings: Secondary | ICD-10-CM

## 2021-01-04 MED ORDER — QUETIAPINE FUMARATE 50 MG PO TABS
ORAL_TABLET | ORAL | 0 refills | Status: DC
  Filled 2021-01-04: qty 22, 22d supply, fill #0
  Filled 2021-02-07: qty 8, 8d supply, fill #1

## 2021-01-04 MED ORDER — BUPROPION HCL ER (SR) 150 MG PO TB12
ORAL_TABLET | ORAL | 0 refills | Status: DC
  Filled 2021-01-04: qty 30, 30d supply, fill #0

## 2021-01-04 MED ORDER — DULOXETINE HCL 30 MG PO CPEP
ORAL_CAPSULE | ORAL | 0 refills | Status: DC
  Filled 2021-01-04: qty 120, 30d supply, fill #0

## 2021-01-04 MED ORDER — BUSPIRONE HCL 10 MG PO TABS
ORAL_TABLET | ORAL | 0 refills | Status: DC
  Filled 2021-01-04: qty 90, 30d supply, fill #0

## 2021-01-05 ENCOUNTER — Other Ambulatory Visit: Payer: Self-pay

## 2021-01-06 ENCOUNTER — Telehealth: Payer: Self-pay | Admitting: Obstetrics and Gynecology

## 2021-01-06 NOTE — Telephone Encounter (Signed)
Pt called to schedule her next physical with Dr.Evans- 02/16/2021, she is requesting a refill on her estradiol to get her to her physical appointment. Confirmed pharmacy as [facility]. Please Advise.

## 2021-01-11 ENCOUNTER — Other Ambulatory Visit: Payer: Self-pay | Admitting: Obstetrics and Gynecology

## 2021-01-11 DIAGNOSIS — Z01419 Encounter for gynecological examination (general) (routine) without abnormal findings: Secondary | ICD-10-CM

## 2021-01-12 ENCOUNTER — Other Ambulatory Visit: Payer: Self-pay

## 2021-01-12 MED ORDER — ESTRADIOL 1 MG PO TABS
1.0000 mg | ORAL_TABLET | Freq: Every day | ORAL | 0 refills | Status: DC
  Filled 2021-01-12: qty 90, 90d supply, fill #0

## 2021-01-12 NOTE — Telephone Encounter (Signed)
Pt is calling stating that she is out of Rx estradiol (ESTRACE) 1 MG and would like to see if it can be called in today.  Pharm:  [facility]

## 2021-01-13 ENCOUNTER — Other Ambulatory Visit (HOSPITAL_COMMUNITY): Payer: Self-pay

## 2021-01-13 ENCOUNTER — Other Ambulatory Visit: Payer: Self-pay

## 2021-01-13 NOTE — Telephone Encounter (Signed)
Pt was called and she is aware that it has been sent to her pharmacy and pt stated that she had picked it up.

## 2021-01-21 ENCOUNTER — Other Ambulatory Visit (HOSPITAL_COMMUNITY): Payer: Self-pay

## 2021-02-05 ENCOUNTER — Other Ambulatory Visit: Payer: Self-pay

## 2021-02-07 ENCOUNTER — Other Ambulatory Visit: Payer: Self-pay

## 2021-02-08 ENCOUNTER — Other Ambulatory Visit: Payer: Self-pay

## 2021-02-08 MED ORDER — DULOXETINE HCL 30 MG PO CPEP
60.0000 mg | ORAL_CAPSULE | Freq: Two times a day (BID) | ORAL | 2 refills | Status: DC
  Filled 2021-02-08: qty 120, 30d supply, fill #0
  Filled 2021-03-09: qty 120, 30d supply, fill #1
  Filled 2021-04-28: qty 120, 30d supply, fill #2

## 2021-02-08 MED ORDER — QUETIAPINE FUMARATE 50 MG PO TABS
50.0000 mg | ORAL_TABLET | Freq: Every day | ORAL | 2 refills | Status: DC
  Filled 2021-02-08: qty 30, 30d supply, fill #0
  Filled 2021-03-09: qty 30, 30d supply, fill #1
  Filled 2021-04-06: qty 30, 30d supply, fill #2

## 2021-02-08 MED ORDER — BUSPIRONE HCL 10 MG PO TABS
ORAL_TABLET | ORAL | 2 refills | Status: DC
  Filled 2021-02-08: qty 90, 30d supply, fill #0
  Filled 2021-03-09: qty 71, 23d supply, fill #1
  Filled 2021-04-06: qty 90, 30d supply, fill #2

## 2021-02-08 MED ORDER — BUPROPION HCL ER (SR) 150 MG PO TB12
150.0000 mg | ORAL_TABLET | Freq: Every morning | ORAL | 2 refills | Status: DC
  Filled 2021-02-08: qty 30, 30d supply, fill #0
  Filled 2021-03-09: qty 30, 30d supply, fill #1
  Filled 2021-04-06: qty 30, 30d supply, fill #2

## 2021-02-09 ENCOUNTER — Other Ambulatory Visit: Payer: Self-pay

## 2021-02-10 ENCOUNTER — Other Ambulatory Visit: Payer: Self-pay

## 2021-02-16 ENCOUNTER — Other Ambulatory Visit: Payer: Self-pay

## 2021-02-16 ENCOUNTER — Ambulatory Visit (INDEPENDENT_AMBULATORY_CARE_PROVIDER_SITE_OTHER): Payer: Self-pay | Admitting: Obstetrics and Gynecology

## 2021-02-16 ENCOUNTER — Encounter: Payer: Self-pay | Admitting: Obstetrics and Gynecology

## 2021-02-16 VITALS — BP 151/68 | HR 94 | Ht 63.0 in | Wt 170.8 lb

## 2021-02-16 DIAGNOSIS — Z01419 Encounter for gynecological examination (general) (routine) without abnormal findings: Secondary | ICD-10-CM

## 2021-02-16 MED ORDER — ESTRADIOL 1 MG PO TABS
1.5000 mg | ORAL_TABLET | Freq: Every day | ORAL | 3 refills | Status: DC
  Filled 2021-02-16 – 2021-03-26 (×4): qty 135, 90d supply, fill #0
  Filled 2021-06-26: qty 135, 90d supply, fill #1
  Filled 2021-06-29: qty 45, 30d supply, fill #0
  Filled 2021-07-27: qty 45, 30d supply, fill #1
  Filled 2021-08-25: qty 45, 30d supply, fill #2
  Filled 2021-09-27: qty 45, 30d supply, fill #3
  Filled 2021-10-24: qty 45, 30d supply, fill #4
  Filled 2021-11-20: qty 45, 30d supply, fill #5
  Filled 2021-12-22: qty 45, 30d supply, fill #6
  Filled 2022-01-25: qty 45, 30d supply, fill #7

## 2021-02-16 NOTE — Addendum Note (Signed)
Addended by: Georgiana Shore R on: 02/16/2021 01:54 PM   Modules accepted: Orders

## 2021-02-16 NOTE — Progress Notes (Signed)
HPI:      [name] is a 56 y.o. G1P1001 who LMP was No LMP recorded. Patient has had a hysterectomy.  Subjective:   She presents today for her annual examination.  She states that her hot flashes continue especially at night.  She would like to consider increasing her estrogen dose to alleviate them.  She reports that she is otherwise doing well. Of significant note, patient has had a prior hysterectomy/oophorectomy for endometriosis.   Hx: The following portions of the patient's history were reviewed and updated as appropriate:             She  has a past medical history of Anxiety, Bipolar disorder ([place]), CHF (congestive heart failure) ([place]), Depression, Disc disorder, Dyspareunia, Endometriosis, GERD (gastroesophageal reflux disease), Insomnia, Lichen, Surgical menopause, Vaginal atrophy, and Wears glasses. She does not have any pertinent problems on file. She  has a past surgical history that includes Abdominal hysterectomy (1995); Arm wound repair / closure (2013); Knee bursectomy (Left, 09/24/2013); and laparotomy (N/A, 12/30/2014). Her family history includes Cancer in her father; Diabetes in her mother and sister. She  reports that she quit smoking about 3 months ago. Her smoking use included cigarettes. She smoked an average of 1 pack per day. She quit smokeless tobacco use about 11 months ago. She reports that she does not drink alcohol and does not use drugs. She has a current medication list which includes the following prescription(s): bupropion, buspirone, clonazepam, duloxetine, estradiol, ondansetron, pantoprazole, quetiapine, albuterol, cyclobenzaprine, and fluticasone-salmeterol. She is allergic to hydrocodone, aspirin, and morphine and related.       Review of Systems:  Review of Systems  Constitutional: Denied constitutional symptoms, night sweats, recent illness, fatigue, fever, insomnia and weight loss.  Eyes: Denied eye symptoms, eye pain, photophobia, vision  change and visual disturbance.  Ears/Nose/Throat/Neck: Denied ear, nose, throat or neck symptoms, hearing loss, nasal discharge, sinus congestion and sore throat.  Cardiovascular: Denied cardiovascular symptoms, arrhythmia, chest pain/pressure, edema, exercise intolerance, orthopnea and palpitations.  Respiratory: Denied pulmonary symptoms, asthma, pleuritic pain, productive sputum, cough, dyspnea and wheezing.  Gastrointestinal: Denied, gastro-esophageal reflux, melena, nausea and vomiting.  Genitourinary: Denied genitourinary symptoms including symptomatic vaginal discharge, pelvic relaxation issues, and urinary complaints.  Musculoskeletal: Denied musculoskeletal symptoms, stiffness, swelling, muscle weakness and myalgia.  Dermatologic: Denied dermatology symptoms, rash and scar.  Neurologic: Denied neurology symptoms, dizziness, headache, neck pain and syncope.  Psychiatric: Denied psychiatric symptoms, anxiety and depression.  Endocrine: Denied endocrine symptoms including hot flashes and night sweats.   Meds:   Current Outpatient Medications on File Prior to Visit  Medication Sig Dispense Refill   buPROPion (WELLBUTRIN SR) 150 MG 12 hr tablet Take 1 tablet (150 mg total) by mouth once daily in the morning. 30 tablet 2   busPIRone (BUSPAR) 10 MG tablet Take one tablet (10 mg total) by mouth each morning and 2 tablets (20 mg total) each evening 90 tablet 2   clonazePAM (KLONOPIN) 0.5 MG disintegrating tablet Take 1 tablet (0.5 mg total) by mouth at bedtime. (Patient taking differently: Take 0.5 mg by mouth at bedtime as needed.) 30 tablet 0   DULoxetine (CYMBALTA) 30 MG capsule Take 2 capsules (60 mg total) by mouth 2 (two) times daily. 120 capsule 2   ondansetron (ZOFRAN-ODT) 4 MG disintegrating tablet Dissolve 1-2 tablets by mouth once every 8 hours as needed for nausea. 30 tablet 3   pantoprazole (PROTONIX) 40 MG tablet TAKE ONE TABLET BY MOUTH EVERY DAY FOR  REFLUX OR HEARTBURN. 60 tablet  6   QUEtiapine (SEROQUEL) 50 MG tablet Take 1 tablet (50 mg total) by mouth once daily at bedtime. 30 tablet 2   albuterol (PROVENTIL HFA;VENTOLIN HFA) 108 (90 Base) MCG/ACT inhaler Inhale 2 puffs into the lungs every 6 (six) hours as needed for wheezing or shortness of breath. (Patient not taking: Reported on 02/16/2021) 1 Inhaler 2   cyclobenzaprine (FLEXERIL) 10 MG tablet Take 10 mg by mouth at bedtime as needed for muscle spasms. (Patient not taking: Reported on 02/16/2021)     Fluticasone-Salmeterol (ADVAIR) 100-50 MCG/DOSE AEPB Inhale 1 puff into the lungs as needed (shortness of breath). (Patient not taking: Reported on 02/16/2021)     No current facility-administered medications on file prior to visit.     Objective:     Vitals:   02/16/21 1059  BP: (!) 151/68  Pulse: 94    Filed Weights   02/16/21 1059  Weight: 170 lb 12.8 oz (77.5 kg)              Physical examination General NAD, Conversant  HEENT Atraumatic; Op clear with mmm.  Normo-cephalic. Pupils reactive. Anicteric sclerae  Thyroid/Neck Smooth without nodularity or enlargement. Normal ROM.  Neck Supple.  Skin No rashes, lesions or ulceration. Normal palpated skin turgor. No nodularity.  Breasts: No masses or discharge.  Symmetric.  No axillary adenopathy.  Lungs: Clear to auscultation.No rales or wheezes. Normal Respiratory effort, no retractions.  Heart: NSR.  No murmurs or rubs appreciated. No periferal edema  Abdomen: Soft.  Non-tender.  No masses.  No HSM. No hernia  Extremities: Moves all appropriately.  Normal ROM for age. No lymphadenopathy.  Neuro: Oriented to PPT.  Normal mood. Normal affect.     Pelvic:   Vulva: Normal appearance.  No lesions.  Vagina: No lesions or abnormalities noted.  Support: Normal pelvic support.  Urethra No masses tenderness or scarring.  Meatus Normal size without lesions or prolapse.  Cervix: Surgically absent  Anus: Normal exam.  No lesions.  Perineum: Normal exam.  No  lesions.        Bimanual   Uterus: Surgically absent  Adnexae: No masses.  Non-tender to palpation.  Cul-de-sac: Negative for abnormality.     Assessment:    G1P1001 Patient Active Problem List   Diagnosis Date Noted   Encounter for medication management 10/08/2018   SUI (stress urinary incontinence, female) 05/19/2017   Generalized anxiety disorder 03/21/2017   CAP (community acquired pneumonia) 03/18/2017   Acute respiratory failure ([place])    Perforated viscus [id number]   Lichen sclerosus [id number]   Surgical menopause 09/24/2014   Endometriosis 09/24/2014   Bipolar 1 disorder ([place]) 09/24/2014   Insomnia 09/24/2014   GERD (gastroesophageal reflux disease) 09/24/2014   History of stomach ulcers 09/24/2014     1. Well woman exam with routine gynecological exam     Patient doing well on ERT with the exception of daily hot flashes.  Would like to increase her dose.   Plan:            1.  Basic Screening Recommendations The basic screening recommendations for asymptomatic women were discussed with the patient during her visit.  The age-appropriate recommendations were discussed with her and the rational for the tests reviewed.  When I am informed by the patient that another primary care physician has previously obtained the age-appropriate tests and they are up-to-date, only outstanding tests are ordered and referrals given as necessary.  Abnormal results of  tests will be discussed with her when all of her results are completed.  Routine preventative health maintenance measures emphasized: Exercise/Diet/Weight control, Tobacco Warnings, Alcohol/Substance use risks and Stress Management Mammogram ordered -blood work ordered, patient will return when fasting 2.  Increase Estrace to 1.5 mg daily.  Orders Orders Placed This Encounter  Procedures   MM DIGITAL SCREENING BILATERAL   Basic metabolic panel   CBC   Cologuard   Hemoglobin A1c   Lipid panel   TSH     Meds  ordered this encounter  Medications   estradiol (ESTRACE) 1 MG tablet    Sig: Take 1.5 tablets (1.5 mg total) by mouth daily.    Dispense:  135 tablet    Refill:  3          F/U  No follow-ups on file.  Finis Bud, M.D. 02/16/2021 11:41 AM

## 2021-02-16 NOTE — Progress Notes (Signed)
Patient presents for annual exam. Patient states her last mammogram was 3-4 years ago and last coloonscopy was 8+ years ago. Patient states she has been experiecing night sweats. Patient states experiencing reoccurring UTI, 2 within last 6 months with treatment of antibiotics and needing treatment for yeast infection.

## 2021-02-19 ENCOUNTER — Other Ambulatory Visit: Payer: Self-pay

## 2021-02-25 ENCOUNTER — Observation Stay
Admission: EM | Admit: 2021-02-25 | Discharge: 2021-02-27 | Disposition: A | Discharge status: Home / Self Care | Payer: Self-pay | Attending: Internal Medicine | Admitting: Internal Medicine

## 2021-02-25 ENCOUNTER — Encounter: Payer: Self-pay | Admitting: Emergency Medicine

## 2021-02-25 ENCOUNTER — Emergency Department: Payer: Self-pay

## 2021-02-25 ENCOUNTER — Other Ambulatory Visit: Payer: Self-pay

## 2021-02-25 DIAGNOSIS — Z7951 Long term (current) use of inhaled steroids: Secondary | ICD-10-CM | POA: Insufficient documentation

## 2021-02-25 DIAGNOSIS — J189 Pneumonia, unspecified organism: Principal | ICD-10-CM | POA: Diagnosis present

## 2021-02-25 DIAGNOSIS — I5022 Chronic systolic (congestive) heart failure: Secondary | ICD-10-CM | POA: Insufficient documentation

## 2021-02-25 DIAGNOSIS — K219 Gastro-esophageal reflux disease without esophagitis: Secondary | ICD-10-CM | POA: Insufficient documentation

## 2021-02-25 DIAGNOSIS — A419 Sepsis, unspecified organism: Secondary | ICD-10-CM

## 2021-02-25 DIAGNOSIS — Z20822 Contact with and (suspected) exposure to covid-19: Secondary | ICD-10-CM | POA: Insufficient documentation

## 2021-02-25 LAB — RESPIRATORY PANEL BY PCR

## 2021-02-25 LAB — HEPATIC FUNCTION PANEL
ALT: 21 U/L (ref 0–44)
AST: 19 U/L (ref 15–41)
Albumin: 3.3 g/dL — ABNORMAL LOW (ref 3.5–5.0)
Alkaline Phosphatase: 171 U/L — ABNORMAL HIGH (ref 38–126)
Bilirubin, Direct: <0.1 mg/dL (ref 0.0–0.2)
Total Bilirubin: 0.3 mg/dL (ref 0.3–1.2)
Total Protein: 7.2 g/dL (ref 6.5–8.1)

## 2021-02-25 LAB — URINALYSIS, COMPLETE (UACMP) WITH MICROSCOPIC
Bilirubin Urine: NEGATIVE
Glucose, UA: NEGATIVE mg/dL
Hgb urine dipstick: NEGATIVE
Ketones, ur: NEGATIVE mg/dL
Nitrite: NEGATIVE
Protein, ur: NEGATIVE mg/dL
Specific Gravity, Urine: 1.004 — ABNORMAL LOW (ref 1.005–1.030)
pH: 7 (ref 5.0–8.0)

## 2021-02-25 LAB — BASIC METABOLIC PANEL
Anion gap: 8 (ref 5–15)
BUN: 9 mg/dL (ref 6–20)
CO2: 29 mmol/L (ref 22–32)
Calcium: 8.9 mg/dL (ref 8.9–10.3)
Chloride: 97 mmol/L — ABNORMAL LOW (ref 98–111)
Creatinine, Ser: 0.56 mg/dL (ref 0.44–1.00)
GFR, Estimated: >60 mL/min (ref >60)
Glucose, Bld: 150 mg/dL — ABNORMAL HIGH (ref 70–99)
Potassium: 3.8 mmol/L (ref 3.5–5.1)
Sodium: 134 mmol/L — ABNORMAL LOW (ref 135–145)

## 2021-02-25 LAB — PROCALCITONIN: Procalcitonin: <0.1 ng/mL

## 2021-02-25 LAB — CBC
HCT: 36 % (ref 36.0–46.0)
Hemoglobin: 12 g/dL (ref 12.0–15.0)
MCH: 30.5 pg (ref 26.0–34.0)
MCHC: 33.3 g/dL (ref 30.0–36.0)
MCV: 91.4 fL (ref 80.0–100.0)
Platelets: 345 10*3/uL (ref 150–400)
RBC: 3.94 MIL/uL (ref 3.87–5.11)
RDW: 13.7 % (ref 11.5–15.5)
WBC: 13.8 10*3/uL — ABNORMAL HIGH (ref 4.0–10.5)
nRBC: 0 % (ref 0.0–0.2)

## 2021-02-25 LAB — RESP PANEL BY RT-PCR (FLU A&B, COVID) ARPGX2
Influenza A by PCR: NEGATIVE
Influenza B by PCR: NEGATIVE
SARS Coronavirus 2 by RT PCR: NEGATIVE

## 2021-02-25 LAB — TROPONIN I (HIGH SENSITIVITY): Troponin I (High Sensitivity): 7 ng/L (ref <18)

## 2021-02-25 LAB — LACTIC ACID, PLASMA: Lactic Acid, Venous: 1.1 mmol/L (ref 0.5–1.9)

## 2021-02-25 LAB — D-DIMER, QUANTITATIVE: D-Dimer, Quant: 0.33 ug/mL-FEU (ref 0.00–0.50)

## 2021-02-25 LAB — TSH: TSH: 1.668 u[IU]/mL (ref 0.350–4.500)

## 2021-02-25 LAB — GROUP A STREP BY PCR: Group A Strep by PCR: NOT DETECTED

## 2021-02-25 MED ORDER — QUETIAPINE FUMARATE 25 MG PO TABS
50.0000 mg | ORAL_TABLET | Freq: Every day | ORAL | Status: DC
  Administered 2021-02-25 – 2021-02-26 (×2): 50 mg via ORAL
  Filled 2021-02-25 (×2): qty 2

## 2021-02-25 MED ORDER — BUSPIRONE HCL 10 MG PO TABS
20.0000 mg | ORAL_TABLET | Freq: Every evening | ORAL | Status: DC
  Administered 2021-02-25 – 2021-02-26 (×2): 20 mg via ORAL
  Filled 2021-02-25 (×2): qty 2

## 2021-02-25 MED ORDER — SODIUM CHLORIDE 0.9 % IV SOLN
2.0000 g | INTRAVENOUS | Status: DC
  Filled 2021-02-25: qty 20

## 2021-02-25 MED ORDER — ESTRADIOL 1 MG PO TABS
1.5000 mg | ORAL_TABLET | Freq: Every day | ORAL | Status: DC
  Filled 2021-02-25: qty 1.5

## 2021-02-25 MED ORDER — BUSPIRONE HCL 10 MG PO TABS
10.0000 mg | ORAL_TABLET | Freq: Every day | ORAL | Status: DC
  Administered 2021-02-26 – 2021-02-27 (×2): 10 mg via ORAL
  Filled 2021-02-25 (×2): qty 1

## 2021-02-25 MED ORDER — ONDANSETRON HCL 4 MG/2ML IJ SOLN: 4.0000 mg | Freq: Four times a day (QID) | INTRAMUSCULAR | Status: DC | PRN

## 2021-02-25 MED ORDER — ACETAMINOPHEN 325 MG PO TABS
650.0000 mg | ORAL_TABLET | Freq: Four times a day (QID) | ORAL | Status: DC | PRN
  Administered 2021-02-25 – 2021-02-26 (×5): 650 mg via ORAL
  Filled 2021-02-25 (×6): qty 2

## 2021-02-25 MED ORDER — PANTOPRAZOLE SODIUM 40 MG PO TBEC
40.0000 mg | DELAYED_RELEASE_TABLET | Freq: Every day | ORAL | Status: DC
  Administered 2021-02-26: 40 mg via ORAL
  Filled 2021-02-25 (×2): qty 1

## 2021-02-25 MED ORDER — LACTATED RINGERS IV BOLUS
1000.0000 mL | Freq: Once | INTRAVENOUS | Status: AC
Start: 2021-02-25 — End: 2021-02-25
  Administered 2021-02-25: 1000 mL via INTRAVENOUS

## 2021-02-25 MED ORDER — ESTRADIOL 1 MG PO TABS
1.5000 mg | ORAL_TABLET | Freq: Every day | ORAL | Status: DC
  Administered 2021-02-25 – 2021-02-26 (×2): 1.5 mg via ORAL
  Filled 2021-02-25 (×3): qty 1.5

## 2021-02-25 MED ORDER — ALBUTEROL SULFATE HFA 108 (90 BASE) MCG/ACT IN AERS: 2.0000 | INHALATION_SPRAY | Freq: Four times a day (QID) | RESPIRATORY_TRACT | Status: DC | PRN

## 2021-02-25 MED ORDER — ALBUTEROL SULFATE (2.5 MG/3ML) 0.083% IN NEBU: 2.5000 mg | INHALATION_SOLUTION | Freq: Four times a day (QID) | RESPIRATORY_TRACT | Status: DC | PRN

## 2021-02-25 MED ORDER — DULOXETINE HCL 30 MG PO CPEP
60.0000 mg | ORAL_CAPSULE | Freq: Two times a day (BID) | ORAL | Status: DC
  Administered 2021-02-25 – 2021-02-27 (×4): 60 mg via ORAL
  Filled 2021-02-25 (×4): qty 2

## 2021-02-25 MED ORDER — GUAIFENESIN 100 MG/5ML PO LIQD
5.0000 mL | ORAL | Status: DC | PRN
  Administered 2021-02-25 – 2021-02-26 (×3): 5 mL via ORAL
  Filled 2021-02-25 (×5): qty 5

## 2021-02-25 MED ORDER — SODIUM CHLORIDE 0.9 % IV SOLN
500.0000 mg | Freq: Once | INTRAVENOUS | Status: AC
  Administered 2021-02-25: 500 mg via INTRAVENOUS
  Filled 2021-02-25: qty 5

## 2021-02-25 MED ORDER — SODIUM CHLORIDE 0.9 % IV SOLN: INTRAVENOUS | Status: DC

## 2021-02-25 MED ORDER — CEFTRIAXONE SODIUM 1 G IJ SOLR
1.0000 g | Freq: Once | INTRAMUSCULAR | Status: AC
  Administered 2021-02-25: 1 g via INTRAVENOUS
  Filled 2021-02-25: qty 10

## 2021-02-25 MED ORDER — CLONAZEPAM 0.5 MG PO TABS
0.5000 mg | ORAL_TABLET | Freq: Every day | ORAL | Status: DC
Start: 2021-02-25 — End: 2021-02-27
  Administered 2021-02-25 – 2021-02-26 (×2): 0.5 mg via ORAL
  Filled 2021-02-25 (×2): qty 1

## 2021-02-25 MED ORDER — METHYLPREDNISOLONE SODIUM SUCC 125 MG IJ SOLR
125.0000 mg | Freq: Once | INTRAMUSCULAR | Status: AC
  Administered 2021-02-25: 125 mg via INTRAVENOUS
  Filled 2021-02-25: qty 2

## 2021-02-25 MED ORDER — ENOXAPARIN SODIUM 40 MG/0.4ML IJ SOSY
40.0000 mg | PREFILLED_SYRINGE | INTRAMUSCULAR | Status: DC
  Administered 2021-02-25 – 2021-02-26 (×2): 40 mg via SUBCUTANEOUS
  Filled 2021-02-25 (×2): qty 0.4

## 2021-02-25 MED ORDER — LACTATED RINGERS IV BOLUS
1000.0000 mL | Freq: Once | INTRAVENOUS | Status: AC
  Administered 2021-02-25: 1000 mL via INTRAVENOUS

## 2021-02-25 MED ORDER — ONDANSETRON HCL 4 MG PO TABS: 4.0000 mg | ORAL_TABLET | Freq: Four times a day (QID) | ORAL | Status: DC | PRN

## 2021-02-25 MED ORDER — BUPROPION HCL ER (SR) 150 MG PO TB12
150.0000 mg | ORAL_TABLET | Freq: Every morning | ORAL | Status: DC
  Administered 2021-02-26 – 2021-02-27 (×2): 150 mg via ORAL
  Filled 2021-02-25 (×2): qty 1

## 2021-02-25 MED ORDER — ACETAMINOPHEN 650 MG RE SUPP: 650.0000 mg | Freq: Four times a day (QID) | RECTAL | Status: DC | PRN

## 2021-02-25 MED ORDER — SODIUM CHLORIDE 0.9 % IV SOLN
1.0000 g | Freq: Once | INTRAVENOUS | Status: AC
  Administered 2021-02-25: 1 g via INTRAVENOUS
  Filled 2021-02-25: qty 10

## 2021-02-25 MED ORDER — HYDRALAZINE HCL 20 MG/ML IJ SOLN
10.0000 mg | Freq: Four times a day (QID) | INTRAMUSCULAR | Status: DC | PRN
  Administered 2021-02-25: 10 mg via INTRAVENOUS
  Filled 2021-02-25: qty 1

## 2021-02-25 MED ORDER — SODIUM CHLORIDE 0.9 % IV SOLN
500.0000 mg | INTRAVENOUS | Status: DC
  Filled 2021-02-25: qty 5

## 2021-02-25 MED ORDER — SODIUM CHLORIDE 0.9 % IV SOLN: 2.0000 g | INTRAVENOUS | Status: DC

## 2021-02-25 NOTE — H&P (Signed)
History and Physical    Brandy Robinson BCW:[id number] DOB: 1965/12/05 DOA: 02/25/2021  PCP: Abram Sander, MD  Patient coming from: Home  I have personally briefly reviewed patient's old medical records in [facility]  Chief Complaint: Cough/shortness of breath  HPI: Brandy Robinson is a 56 y.o. female with medical history significant of bipolar 1 disorder, endometriosis, GERD presented to ED with a complaint of cough and shortness of breath which started about a week ago.  She is producing green-colored sputum.  She denies any fever.  She also has pleuritic chest pain which is likely secondary to cough.  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  No other complaint.  ED Course: Upon arrival to ED, she was tachycardic and tachypneic.  Has leukocytosis.  She is afebrile.  Chest x-ray shows left basilar opacity but radiology favors atelectasis.  Procalcitonin unremarkable.  She was tested negative for COVID and flu.  Based on all these, ED physician thought that patient has pneumonia, she was given Rocephin and Zithromax, hospitalist were called for admission.  Per ED physician, tried to discharge her home however her oxygen saturation dropped to 90% on room air.  Review of Systems: As per HPI otherwise negative.   Past Medical History:  Diagnosis Date   Anxiety    Bipolar disorder ([facility])    CHF (congestive heart failure) ([facility])    Depression    Disc disorder    Dyspareunia    Endometriosis    GERD (gastroesophageal reflux disease)    Insomnia    Lichen    Surgical menopause    Vaginal atrophy    Wears glasses     Past Surgical History:  Procedure Laterality Date   ABDOMINAL HYSTERECTOMY  1995   [place]-[facility]   ARM WOUND REPAIR / CLOSURE  2013   cellulitis rt arm-i/d   KNEE BURSECTOMY Left 09/24/2013   Procedure: IRRIGATION AND DEBRIDEMENT OF LEFT KNEE ;  Surgeon: Sheral Apley, MD;  Location: [facility];  Service: Orthopedics;   Laterality: Left;   LAPAROTOMY N/A 12/30/2014   Procedure: EXPLORATORY LAPAROTOMY-graham patch of peptic ulcer;  Surgeon: Ricarda Frame, MD;  Location: [facility];  Service: General;  Laterality: N/A;     reports that she quit smoking about 4 months ago. Her smoking use included cigarettes. She smoked an average of 1 pack per day. She quit smokeless tobacco use about a year ago. She reports that she does not drink alcohol and does not use drugs.  Allergies  Allergen Reactions   Hydrocodone Itching   Aspirin Other (See Comments)    Reaction:  GI upset    Morphine And Related Itching, Nausea And Vomiting and Other (See Comments)    Reaction:  Chest pain    Family History  Problem Relation Age of Onset   Diabetes Mother    Diabetes Sister    Cancer Father        Throat   Heart disease Neg Hx    Breast cancer Neg Hx    Ovarian cancer Neg Hx    Colon cancer Neg Hx     Prior to Admission medications   Medication Sig Start Date End Date Taking? Authorizing Provider  albuterol (PROVENTIL HFA;VENTOLIN HFA) 108 (90 Base) MCG/ACT inhaler Inhale 2 puffs into the lungs every 6 (six) hours as needed for wheezing or shortness of breath. Patient not taking: Reported on 02/16/2021 03/18/18   Emily Filbert,  MD  buPROPion (WELLBUTRIN SR) 150 MG 12 hr tablet Take 1 tablet (150 mg total) by mouth once daily in the morning. 02/08/21     busPIRone (BUSPAR) 10 MG tablet Take one tablet (10 mg total) by mouth each morning and 2 tablets (20 mg total) each evening 02/08/21     clonazePAM (KLONOPIN) 0.5 MG disintegrating tablet Take 1 tablet (0.5 mg total) by mouth at bedtime. Patient taking differently: Take 0.5 mg by mouth at bedtime as needed. 03/25/17   Nicholes Mango, MD  cyclobenzaprine (FLEXERIL) 10 MG tablet Take 10 mg by mouth at bedtime as needed for muscle spasms. Patient not taking: Reported on 02/16/2021    [provider]  DULoxetine (CYMBALTA) 30 MG capsule Take 2 capsules (60 mg  total) by mouth 2 (two) times daily. 02/08/21     estradiol (ESTRACE) 1 MG tablet TAKE 1 1/2 TABLETS (1.5MG  TOTAL) BY MOUTH ONCE DAILY 02/16/21 02/16/22  Harlin Heys, MD  Fluticasone-Salmeterol (ADVAIR) 100-50 MCG/DOSE AEPB Inhale 1 puff into the lungs as needed (shortness of breath). Patient not taking: Reported on 02/16/2021    [provider]  ondansetron (ZOFRAN-ODT) 4 MG disintegrating tablet Dissolve 1-2 tablets by mouth once every 8 hours as needed for nausea. 11/25/20     pantoprazole (PROTONIX) 40 MG tablet TAKE ONE TABLET BY MOUTH EVERY DAY FOR REFLUX OR HEARTBURN. 09/16/20 09/16/21    QUEtiapine (SEROQUEL) 50 MG tablet Take 1 tablet (50 mg total) by mouth once daily at bedtime. 02/08/21       Physical Exam: Vitals:   02/25/21 1130 02/25/21 1141 02/25/21 1200 02/25/21 1230  BP: (!) 146/75  139/67 (!) 144/75  Pulse: (!) 101 (!) 125 (!) 104 98  Resp: (!) 29 (!) 24 (!) 22 (!) 24  Temp:      TempSrc:      SpO2: 93% 91% 94% 95%  Weight:      Height:        Constitutional: NAD, calm, comfortable Vitals:   02/25/21 1130 02/25/21 1141 02/25/21 1200 02/25/21 1230  BP: (!) 146/75  139/67 (!) 144/75  Pulse: (!) 101 (!) 125 (!) 104 98  Resp: (!) 29 (!) 24 (!) 22 (!) 24  Temp:      TempSrc:      SpO2: 93% 91% 94% 95%  Weight:      Height:       Eyes: PERRL, lids and conjunctivae normal ENMT: Mucous membranes are moist. Posterior pharynx clear of any exudate or lesions.Normal dentition.  Neck: normal, supple, no masses, no thyromegaly Respiratory: Scattered rhonchi with bilateral expiratory wheezes.  Normal respiratory effort. No accessory muscle use.  Cardiovascular: Regular rate and rhythm, no murmurs / rubs / gallops. No extremity edema. 2+ pedal pulses. No carotid bruits.  Abdomen: no tenderness, no masses palpated. No hepatosplenomegaly. Bowel sounds positive.  Musculoskeletal: no clubbing / cyanosis. No joint deformity upper and lower extremities. Good ROM, no  contractures. Normal muscle tone.  Skin: no rashes, lesions, ulcers. No induration Neurologic: CN 2-12 grossly intact. Sensation intact, DTR normal. Strength 5/5 in all 4.  Psychiatric: Normal judgment and insight. Alert and oriented x 3. Normal mood.    Labs on Admission: I have personally reviewed following labs and imaging studies  CBC: Recent Labs  Lab 02/25/21 0940  WBC 13.8*  HGB 12.0  HCT 36.0  MCV 91.4  PLT 123456   Basic Metabolic Panel: Recent Labs  Lab 02/25/21 0940  NA 134*  K 3.8  CL 97*  CO2 29  GLUCOSE 150*  BUN 9  CREATININE 0.56  CALCIUM 8.9   GFR: Estimated Creatinine Clearance: 80.1 mL/min (by C-G formula based on SCr of 0.56 mg/dL). Liver Function Tests: Recent Labs  Lab 02/25/21 0940  AST 19  ALT 21  ALKPHOS 171*  BILITOT 0.3  PROT 7.2  ALBUMIN 3.3*   No results for input(s): LIPASE, AMYLASE in the last 168 hours. No results for input(s): AMMONIA in the last 168 hours. Coagulation Profile: No results for input(s): INR, PROTIME in the last 168 hours. Cardiac Enzymes: No results for input(s): CKTOTAL, CKMB, CKMBINDEX, TROPONINI in the last 168 hours. BNP (last 3 results) No results for input(s): PROBNP in the last 8760 hours. HbA1C: No results for input(s): HGBA1C in the last 72 hours. CBG: No results for input(s): GLUCAP in the last 168 hours. Lipid Profile: No results for input(s): CHOL, HDL, LDLCALC, TRIG, CHOLHDL, LDLDIRECT in the last 72 hours. Thyroid Function Tests: No results for input(s): TSH, T4TOTAL, FREET4, T3FREE, THYROIDAB in the last 72 hours. Anemia Panel: No results for input(s): VITAMINB12, FOLATE, FERRITIN, TIBC, IRON, RETICCTPCT in the last 72 hours. Urine analysis:    Component Value Date/Time   COLORURINE STRAW (A) 02/25/2021 1019   APPEARANCEUR CLEAR (A) 02/25/2021 1019   APPEARANCEUR Clear 05/15/2014 2120   LABSPEC 1.004 (L) 02/25/2021 1019   LABSPEC 1.006 05/15/2014 2120   PHURINE 7.0 02/25/2021 1019    GLUCOSEU NEGATIVE 02/25/2021 1019   GLUCOSEU Negative 05/15/2014 2120   HGBUR NEGATIVE 02/25/2021 1019   BILIRUBINUR NEGATIVE 02/25/2021 1019   BILIRUBINUR Negative 05/15/2014 2120   KETONESUR NEGATIVE 02/25/2021 1019   PROTEINUR NEGATIVE 02/25/2021 1019   NITRITE NEGATIVE 02/25/2021 1019   LEUKOCYTESUR SMALL (A) 02/25/2021 1019   LEUKOCYTESUR Trace 05/15/2014 2120    Radiological Exams on Admission: DG Chest 2 View  Result Date: 02/25/2021 CLINICAL DATA:  cough, chest pain EXAM: CHEST - 2 VIEW COMPARISON:  March 18, 2018. FINDINGS: Mild streaky left basilar opacities. No confluent consolidation. No visible pleural effusions or pneumothorax. Cardiomediastinal silhouette is within normal limits and similar to prior. No evidence of acute osseous abnormality. IMPRESSION: Mild streaky left basilar opacities, favor atelectasis. Electronically Signed   By: Margaretha Sheffield M.D.   On: 02/25/2021 10:02    EKG: Independently reviewed.  Sinus tachycardia with nonspecific ST-T wave changes.  Assessment/Plan Principal Problem:   CAP (community acquired pneumonia) Active Problems:   Sepsis ([place])   Presumed sepsis secondary to presumed viral pneumonia/?  Community-acquired pneumonia: Patient meets sepsis criteria based on tachycardia, tachypnea and leukocytosis.  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  Chest x-ray shows some opacity but likely atelectasis.  Bacterial pneumonia cannot be ruled out.  She is negative for COVID and flu.  I will check respiratory viral panel, urine antigen for Legionella and streptococci as well as a sputum culture.  Since she has hoarse voice, I will also check strep throat.  I will continue Rocephin and Zithromax as well as bronchodilators.  She is slightly wheezy, I will give her a dose of Solu-Medrol 125 mg x 1.  Maintain droplet and contact precautions.  She has received IV fluids per sepsis protocol, I will continue  her on normal saline at 100 cc/h.  Lactic acid is normal.  D-dimer normal.  GERD: Resume PPI.  Bipolar 1 disorder: Resume home medications.  Chest pain: Likely pleuritic.  Troponins negative.  Tylenol  as needed.  DVT prophylaxis: enoxaparin (LOVENOX) injection 40 mg Start: 02/25/21 1300 Code Status: Full code Family Communication: None present at bedside.  Plan of care discussed with patient in length and he verbalized understanding and agreed with it. Disposition Plan: Potential discharge home tomorrow Consults called: None  Darliss Cheney MD [facility] Please page via [place] and do not message via secure chat for urgent patient care matters. Secure chat can be used for non urgent patient care matters. 02/25/2021, 12:46 PM  To contact the attending provider between 7A-7P or the covering provider during after hours 7P-7A, please log into the web site [email or web address]

## 2021-02-25 NOTE — ED Provider Notes (Signed)
[facility] Provider Note    Event Date/Time   First MD Initiated Contact with Patient 02/25/21 [phone]     (approximate)   History   Chest Pain, Sore Throat, and Cough   HPI  Brandy Robinson is a 56 y.o. female with a past medical history of GERD, CHF, bipolar disorder, anxiety, endometriosis and remote tobacco abuse who presents for assessment approximately 1 week of shortness of breath associated with productive cough chest tightness, sore throat, congestion, decreased appetite and some lightheadedness.  No measured fevers, earache, back pain, hemoptysis, abdominal pain, nausea, vomiting, diarrhea or blood in the urine the patient does states she has had a little bit of burning with urination of the last 2 or 3 days.  No rash or extremity pain.  She denies EtOH use or drug use.  No other acute concerns at this time.    Past Medical History:  Diagnosis Date   Anxiety    Bipolar disorder (HCC)    CHF (congestive heart failure) (HCC)    Depression    Disc disorder    Dyspareunia    Endometriosis    GERD (gastroesophageal reflux disease)    Insomnia    Lichen    Surgical menopause    Vaginal atrophy    Wears glasses      Physical Exam  Triage Vital Signs: ED Triage Vitals  Enc Vitals Group     BP 02/25/21 0939 (!) 154/73     Pulse Rate 02/25/21 0939 (!) 101     Resp 02/25/21 0939 18     Temp 02/25/21 0939 98.6 F (37 C)     Temp Source 02/25/21 0939 Oral     SpO2 02/25/21 0939 91 %     Weight 02/25/21 0938 170 lb 12.8 oz (77.5 kg)     Height 02/25/21 0938 5\' 3"  (1.6 m)     Head Circumference --      Peak Flow --      Pain Score 02/25/21 0938 6     Pain Loc --      Pain Edu? --      Excl. in [place]? --     Most recent vital signs: Vitals:   02/25/21 1130 02/25/21 1141  BP: (!) 146/75   Pulse: (!) 101 (!) 125  Resp: (!) 29 (!) 24  Temp:    SpO2: 93% 91%    General: Awake, appears uncomfortable. CV:  Prolonged capillary refill and  slightly tachycardic with no audible murmurs rubs or gallops Resp:  Tachypneic with bilateral rhonchi. Abd:  No distention.  Soft throughout. Other:  Oropharynx has dry mucous membranes.  No significant lower extreme edema.   ED Results / Procedures / Treatments  Labs (all labs ordered are listed, but only abnormal results are displayed) Labs Reviewed  BASIC METABOLIC PANEL - Abnormal; Notable for the following components:      Result Value   Sodium 134 (*)    Chloride 97 (*)    Glucose, Bld 150 (*)    All other components within normal limits  CBC - Abnormal; Notable for the following components:   WBC 13.8 (*)    All other components within normal limits  URINALYSIS, COMPLETE (UACMP) WITH MICROSCOPIC - Abnormal; Notable for the following components:   Color, Urine STRAW (*)    APPearance CLEAR (*)    Specific Gravity, Urine 1.004 (*)    Leukocytes,Ua SMALL (*)    Bacteria, UA RARE (*)  All other components within normal limits  HEPATIC FUNCTION PANEL - Abnormal; Notable for the following components:   Albumin 3.3 (*)    Alkaline Phosphatase 171 (*)    All other components within normal limits  RESP PANEL BY RT-PCR (FLU A&B, COVID) [id number]  GROUP A STREP BY PCR  CULTURE, BLOOD (ROUTINE X 2)  CULTURE, BLOOD (ROUTINE X 2)  LACTIC ACID, PLASMA  D-DIMER, QUANTITATIVE  PROCALCITONIN  LACTIC ACID, PLASMA  TROPONIN I (HIGH SENSITIVITY)     EKG  ECG is remarkable for sinus tachycardia with a ventricular rate of 106, normal axis, unremarkable intervals without clearance of acute ischemia or significant arrhythmia.    RADIOLOGY  Chest x-ray interpreted by myself shows appears to be early opacification of the bilateral bases.  No other clear consolidation, effusion, edema, pneumothorax or other clear acute process.  I also reviewed radiology's interpretation.   PROCEDURES:  Critical Care performed: Yes, see critical care procedure note(s)  .Critical Care Performed by:  Lucrezia Starch, MD Authorized by: Lucrezia Starch, MD   Critical care provider statement:    Critical care time (minutes):  30   Critical care was necessary to treat or prevent imminent or life-threatening deterioration of the following conditions:  Sepsis   Critical care was time spent personally by me on the following activities:  Development of treatment plan with patient or surrogate, discussions with consultants, evaluation of patient's response to treatment, examination of patient, ordering and review of laboratory studies, ordering and review of radiographic studies, ordering and performing treatments and interventions, pulse oximetry, re-evaluation of patient's condition and review of old charts    MEDICATIONS ORDERED IN ED: Medications  lactated ringers bolus 1,000 mL (has no administration in time range)  lactated ringers bolus 1,000 mL (1,000 mLs Intravenous New Bag/Given 02/25/21 1033)  cefTRIAXone (ROCEPHIN) 1 g in sodium chloride 0.9 % 100 mL IVPB (1 g Intravenous New Bag/Given 02/25/21 1104)  azithromycin (ZITHROMAX) 500 mg in sodium chloride 0.9 % 250 mL IVPB (500 mg Intravenous New Bag/Given 02/25/21 1105)     IMPRESSION / MDM / [place] / ED COURSE  I reviewed the triage vital signs and the nursing notes.                              Differential diagnosis includes, but is not limited to pneumonia, bronchitis, PE, symptomatic pleural effusion, myocarditis, pericarditis possible electrolyte derangements or acute respiratory infection.  Patient is slightly tachycardic on arrival and given a WBC count on CBC of 13.8 and concern for possible sepsis and will do blood cultures and lactic acid as well.  CBC otherwise with no evidence of acute anemia normal platelets.  BMP without any significant electrolyte or metabolic reactions  ECG is remarkable for sinus tachycardia with a ventricular rate of 106, normal axis, unremarkable intervals without clearance of acute ischemia  or significant arrhythmia.  Initial troponin nonelevated 7.  Low suspicion for ACS or myocarditis.  Chest x-ray interpreted by myself shows appears to be early opacification of the bilateral bases.  No other clear consolidation, effusion, edema, pneumothorax or other clear acute process.  I also reviewed radiology's interpretation.  CBC is remarkable for leukocytosis with WBC count of 13.8.  No acute anemia normal platelets.  BMP is remarkable for no significant electrode metabolic derangements.  Attic function panel largely unremarkable.  Open influenza PCR is negative.  Procalcitonin is undetectable.  Lactic acid  is not elevated.  D-dimer 0.83 and nonsuggestive of PE.  Troponin of 7 obtain greater than 3 hours after symptom onset with otherwise relatively reassuring EKG is not suggestive of ACS. \ Given tachycardia tachypnea and leukocytosis with borderline SPO2 and concern for sepsis from pneumonia.  Patient started on broad-spectrum antibiotics and blood cultures obtained.  Will plan to admit to medicine service for further evaluation and management.  I discussed this with admitting hospitalist.      FINAL CLINICAL IMPRESSION(S) / ED DIAGNOSES   Final diagnoses:  Community acquired pneumonia, unspecified laterality  Sepsis, due to unspecified organism, unspecified whether acute organ dysfunction present [facility])     Rx / DC Orders   ED Discharge Orders     None        Note:  This document was prepared using Dragon voice recognition software and may include unintentional dictation errors.   Lucrezia Starch, MD 02/25/21 1229

## 2021-02-25 NOTE — Plan of Care (Signed)

## 2021-02-25 NOTE — ED Notes (Signed)
Secure msg sent to Jasmine Dorsett, RN for ED to IP SBAR. °

## 2021-02-25 NOTE — ED Notes (Signed)
SBAR complete & okay to send pt to floor per Owens Shark, RN. This RN asked secretary to put transport in for pt to go to the floor.

## 2021-02-25 NOTE — ED Triage Notes (Signed)
Pt comes into the ED via POV c/o cough, sore throat, and chest pain.  PT states the chest pain started once she got the cough, but now it is staying all the time.  PT states symptoms started Monday.  Pt currently has even and unlabored respirations.  Denies any cardiac history.

## 2021-02-25 NOTE — Progress Notes (Signed)
MD notified that pt's BP is elevated upon admission from the ED, 179/89. Pt is symptomatic complaining of a headache. PRN hydralazine ordered and given at this time. Tylenol given for HA. Fluids on hold at this time. RN will continue to monitor pt and recheck BP as necessary.

## 2021-02-25 NOTE — Progress Notes (Signed)
CODE SEPSIS - PHARMACY COMMUNICATION  **Broad Spectrum Antibiotics should be administered within 1 hour of Sepsis diagnosis**  Time Code Sepsis Called/Page Received: 1012  Antibiotics Ordered: ceftriaxone/azithromycin  Time of 1st antibiotic administration: [place], PharmD, BCPS Clinical Pharmacist 02/25/2021 11:08 AM

## 2021-02-25 NOTE — Progress Notes (Signed)
Cross Cover Home regimen estradiol ordered per patient request for control of menopausal hot flash symptoms

## 2021-02-25 NOTE — Progress Notes (Signed)
Elink following code sepsis °

## 2021-02-25 NOTE — ED Notes (Signed)
Medications not verified by pharmacy at this time.

## 2021-02-26 DIAGNOSIS — K219 Gastro-esophageal reflux disease without esophagitis: Secondary | ICD-10-CM

## 2021-02-26 DIAGNOSIS — F319 Bipolar disorder, unspecified: Secondary | ICD-10-CM

## 2021-02-26 LAB — CBC
HCT: 37.8 % (ref 36.0–46.0)
Hemoglobin: 12.5 g/dL (ref 12.0–15.0)
MCH: 30 pg (ref 26.0–34.0)
MCHC: 33.1 g/dL (ref 30.0–36.0)
MCV: 90.6 fL (ref 80.0–100.0)
Platelets: 345 10*3/uL (ref 150–400)
RBC: 4.17 MIL/uL (ref 3.87–5.11)
RDW: 13.8 % (ref 11.5–15.5)
WBC: 10.2 10*3/uL (ref 4.0–10.5)
nRBC: 0 % (ref 0.0–0.2)

## 2021-02-26 LAB — COMPREHENSIVE METABOLIC PANEL
ALT: 20 U/L (ref 0–44)
AST: 20 U/L (ref 15–41)
Albumin: 3.3 g/dL — ABNORMAL LOW (ref 3.5–5.0)
Alkaline Phosphatase: 205 U/L — ABNORMAL HIGH (ref 38–126)
Anion gap: 10 (ref 5–15)
BUN: 12 mg/dL (ref 6–20)
CO2: 26 mmol/L (ref 22–32)
Calcium: 9.2 mg/dL (ref 8.9–10.3)
Chloride: 98 mmol/L (ref 98–111)
Creatinine, Ser: 0.64 mg/dL (ref 0.44–1.00)
GFR, Estimated: >60 mL/min (ref >60)
Glucose, Bld: 336 mg/dL — ABNORMAL HIGH (ref 70–99)
Potassium: 4.2 mmol/L (ref 3.5–5.1)
Sodium: 134 mmol/L — ABNORMAL LOW (ref 135–145)
Total Bilirubin: 0.3 mg/dL (ref 0.3–1.2)
Total Protein: 7.6 g/dL (ref 6.5–8.1)

## 2021-02-26 LAB — CORTISOL-AM, BLOOD: Cortisol - AM: 3.1 ug/dL — ABNORMAL LOW (ref 6.7–22.6)

## 2021-02-26 LAB — HIV ANTIBODY (ROUTINE TESTING W REFLEX): HIV Screen 4th Generation wRfx: NONREACTIVE

## 2021-02-26 LAB — GLUCOSE, CAPILLARY
Glucose-Capillary: 302 mg/dL — ABNORMAL HIGH (ref 70–99)
Glucose-Capillary: 151 mg/dL — ABNORMAL HIGH (ref 70–99)
Glucose-Capillary: 198 mg/dL — ABNORMAL HIGH (ref 70–99)

## 2021-02-26 LAB — PROTIME-INR
INR: 1 (ref 0.8–1.2)
Prothrombin Time: 12.8 seconds (ref 11.4–15.2)

## 2021-02-26 LAB — STREP PNEUMONIAE URINARY ANTIGEN: Strep Pneumo Urinary Antigen: NEGATIVE

## 2021-02-26 LAB — PROCALCITONIN: Procalcitonin: <0.1 ng/mL

## 2021-02-26 LAB — HEMOGLOBIN A1C
Hgb A1c MFr Bld: 6.6 % — ABNORMAL HIGH (ref 4.8–5.6)
Mean Plasma Glucose: 142.72 mg/dL

## 2021-02-26 MED ORDER — AZITHROMYCIN 250 MG PO TABS
250.0000 mg | ORAL_TABLET | Freq: Every day | ORAL | Status: DC
  Administered 2021-02-26 – 2021-02-27 (×2): 250 mg via ORAL
  Filled 2021-02-26 (×2): qty 1

## 2021-02-26 MED ORDER — ALBUTEROL SULFATE (2.5 MG/3ML) 0.083% IN NEBU: 3.0000 mL | INHALATION_SOLUTION | RESPIRATORY_TRACT | Status: DC | PRN

## 2021-02-26 MED ORDER — INSULIN ASPART 100 UNIT/ML IJ SOLN: 0.0000 [IU] | Freq: Every day | INTRAMUSCULAR | Status: DC

## 2021-02-26 MED ORDER — INSULIN ASPART 100 UNIT/ML IJ SOLN
0.0000 [IU] | Freq: Three times a day (TID) | INTRAMUSCULAR | Status: DC
  Administered 2021-02-26: 7 [IU] via SUBCUTANEOUS
  Administered 2021-02-26: 2 [IU] via SUBCUTANEOUS
  Filled 2021-02-26 (×2): qty 1

## 2021-02-26 NOTE — Progress Notes (Signed)
PROGRESS NOTE    Brandy Robinson  TXM:[id number] DOB: 1965/08/21 DOA: 02/25/2021 PCP: Abram Sander, MD   Assessment & Plan:   Principal Problem:   CAP (community acquired pneumonia) Active Problems:   Sepsis (HCC)     Viral pneumonia vs viral URI: rhinovirus/enterovirus is positive. COVID19 and influenza were both neg. Continue w/ bronchodilators, encourage incentive spirometry and d/c IV rocephin. Strep was neg, legionella is pending. Continue w/ supportive care. Pro-cal < 0.10. Blood cxs NGTD  GERD: continue on PPI   Bipolar 1 disorder: continue on home dose of seroquel, duloxetine, buspirone, & bupropion.   Chest pain: Likely pleuritic.  Troponins negative.  Tylenol as needed.  DVT prophylaxis: lovenox  Code Status: full  Family Communication: discussed pt's care w/ pt's family at bedside and answered their questions  Disposition Plan: likely d/c home tomorrow   Level of care: Med-Surg  Status is: Observation The patient remains OBS appropriate and will d/c before 2 midnights.     Consultants:    Procedures:   Antimicrobials: azithromycin    Subjective: Pt c/o cough   Objective: Vitals:   02/25/21 1444 02/25/21 1604 02/25/21 1938 02/26/21 0403  BP: (!) 179/84 (!) 137/59 (!) 157/78 138/64  Pulse: 96 (!) 101 (!) 102 98  Resp: 18 18 15 16   Temp: 97.8 F (36.6 C) 98.2 F (36.8 C) 98.4 F (36.9 C) 98.1 F (36.7 C)  TempSrc: Oral     SpO2: 94% 92% 91% 93%  Weight:      Height:        Intake/Output Summary (Last 24 hours) at 02/26/2021 0653 Last data filed at 02/25/2021 2100 Gross per 24 hour  Intake 1409 ml  Output --  Net 1409 ml   Filed Weights   02/25/21 0938  Weight: 77.5 kg    Examination:  General exam: Appears calm and comfortable  Respiratory system: diminished breath sounds b/l  Cardiovascular system: S1 & S2+. No rubs, gallops or clicks. Gastrointestinal system: Abdomen is nondistended, soft and nontender. Normal bowel sounds  heard. Central nervous system: Alert and oriented. Moves all extremities  Psychiatry: Judgement and insight appear normal. Flat mood and affect     Data Reviewed: I have personally reviewed following labs and imaging studies  CBC: Recent Labs  Lab 02/25/21 0940 02/26/21 0518  WBC 13.8* 10.2  HGB 12.0 12.5  HCT 36.0 37.8  MCV 91.4 90.6  PLT 345 345   Basic Metabolic Panel: Recent Labs  Lab 02/25/21 0940 02/26/21 0518  NA 134* 134*  K 3.8 4.2  CL 97* 98  CO2 29 26  GLUCOSE 150* 336*  BUN 9 12  CREATININE 0.56 0.64  CALCIUM 8.9 9.2   GFR: Estimated Creatinine Clearance: 80.1 mL/min (by C-G formula based on SCr of 0.64 mg/dL). Liver Function Tests: Recent Labs  Lab 02/25/21 0940 02/26/21 0518  AST 19 20  ALT 21 20  ALKPHOS 171* 205*  BILITOT 0.3 0.3  PROT 7.2 7.6  ALBUMIN 3.3* 3.3*   No results for input(s): LIPASE, AMYLASE in the last 168 hours. No results for input(s): AMMONIA in the last 168 hours. Coagulation Profile: Recent Labs  Lab 02/26/21 0518  INR 1.0   Cardiac Enzymes: No results for input(s): CKTOTAL, CKMB, CKMBINDEX, TROPONINI in the last 168 hours. BNP (last 3 results) No results for input(s): PROBNP in the last 8760 hours. HbA1C: No results for input(s): HGBA1C in the last 72 hours. CBG: No results for input(s): GLUCAP in the last  168 hours. Lipid Profile: No results for input(s): CHOL, HDL, LDLCALC, TRIG, CHOLHDL, LDLDIRECT in the last 72 hours. Thyroid Function Tests: Recent Labs    02/25/21 0940  TSH 1.668   Anemia Panel: No results for input(s): VITAMINB12, FOLATE, FERRITIN, TIBC, IRON, RETICCTPCT in the last 72 hours. Sepsis Labs: Recent Labs  Lab 02/25/21 0940 02/25/21 1019 02/26/21 0518  PROCALCITON <0.10  --  <0.10  LATICACIDVEN  --  1.1  --     Recent Results (from the past 240 hour(s))  Resp Panel by RT-PCR (Flu A&B, Covid) Nasopharyngeal Swab     Status: None   Collection Time: 02/25/21 10:07 AM   Specimen:  Nasopharyngeal Swab; Nasopharyngeal(NP) swabs in vial transport medium  Result Value Ref Range Status   SARS Coronavirus 2 by RT PCR NEGATIVE NEGATIVE Final    Comment: (NOTE) SARS-CoV-2 target nucleic acids are NOT DETECTED.  The SARS-CoV-2 RNA is generally detectable in upper respiratory specimens during the acute phase of infection. The lowest concentration of SARS-CoV-2 viral copies this assay can detect is 138 copies/mL. A negative result does not preclude SARS-Cov-2 infection and should not be used as the sole basis for treatment or other patient management decisions. A negative result may occur with  improper specimen collection/handling, submission of specimen other than nasopharyngeal swab, presence of viral mutation(s) within the areas targeted by this assay, and inadequate number of viral copies(<138 copies/mL). A negative result must be combined with clinical observations, patient history, and epidemiological information. The expected result is Negative.  Fact Sheet for Patients:  BloggerCourse.com  Fact Sheet for Healthcare Providers:  SeriousBroker.it  This test is no t yet approved or cleared by the [place] FDA and  has been authorized for detection and/or diagnosis of SARS-CoV-2 by FDA under an Emergency Use Authorization (EUA). This EUA will remain  in effect (meaning this test can be used) for the duration of the COVID-19 declaration under Section 564(b)(1) of the Act, 21 U.S.C.section 360bbb-3(b)(1), unless the authorization is terminated  or revoked sooner.       Influenza A by PCR NEGATIVE NEGATIVE Final   Influenza B by PCR NEGATIVE NEGATIVE Final    Comment: (NOTE) The Xpert Xpress SARS-CoV-2/FLU/RSV plus assay is intended as an aid in the diagnosis of influenza from Nasopharyngeal swab specimens and should not be used as a sole basis for treatment. Nasal washings and aspirates are unacceptable for  Xpert Xpress SARS-CoV-2/FLU/RSV testing.  Fact Sheet for Patients: BloggerCourse.com  Fact Sheet for Healthcare Providers: SeriousBroker.it  This test is not yet approved or cleared by the [place] FDA and has been authorized for detection and/or diagnosis of SARS-CoV-2 by FDA under an Emergency Use Authorization (EUA). This EUA will remain in effect (meaning this test can be used) for the duration of the COVID-19 declaration under Section 564(b)(1) of the Act, 21 U.S.C. section 360bbb-3(b)(1), unless the authorization is terminated or revoked.  Performed at [facility], [address] [place], [place] [address]   Group A Strep by PCR [facility])     Status: None   Collection Time: 02/25/21 10:19 AM   Specimen: Throat; Sterile Swab  Result Value Ref Range Status   Group A Strep by PCR NOT DETECTED NOT DETECTED Final    Comment: Performed at [facility], [address] [place], [place] [address]  Respiratory (~20 pathogens) panel by PCR     Status: Abnormal   Collection Time: 02/25/21  3:25 PM   Specimen: Nasopharyngeal  Swab; Respiratory  Result Value Ref Range Status   Adenovirus NOT DETECTED NOT DETECTED Final   Coronavirus 229E NOT DETECTED NOT DETECTED Final    Comment: (NOTE) The Coronavirus on the Respiratory Panel, DOES NOT test for the novel  Coronavirus (2019 nCoV)    Coronavirus HKU1 NOT DETECTED NOT DETECTED Final   Coronavirus NL63 NOT DETECTED NOT DETECTED Final   Coronavirus OC43 NOT DETECTED NOT DETECTED Final   Metapneumovirus NOT DETECTED NOT DETECTED Final   Rhinovirus / Enterovirus DETECTED (A) NOT DETECTED Final   Influenza A NOT DETECTED NOT DETECTED Final   Influenza B NOT DETECTED NOT DETECTED Final   Parainfluenza Virus 1 NOT DETECTED NOT DETECTED Final   Parainfluenza Virus 2 NOT DETECTED NOT DETECTED Final   Parainfluenza Virus 3 NOT DETECTED NOT DETECTED Final    Parainfluenza Virus 4 NOT DETECTED NOT DETECTED Final   Respiratory Syncytial Virus NOT DETECTED NOT DETECTED Final   Bordetella pertussis NOT DETECTED NOT DETECTED Final   Bordetella Parapertussis NOT DETECTED NOT DETECTED Final   Chlamydophila pneumoniae NOT DETECTED NOT DETECTED Final   Mycoplasma pneumoniae NOT DETECTED NOT DETECTED Final    Comment: Performed at [facility], [address], [place], [place] [address]         Radiology Studies: DG Chest 2 View  Result Date: 02/25/2021 CLINICAL DATA:  cough, chest pain EXAM: CHEST - 2 VIEW COMPARISON:  March 18, 2018. FINDINGS: Mild streaky left basilar opacities. No confluent consolidation. No visible pleural effusions or pneumothorax. Cardiomediastinal silhouette is within normal limits and similar to prior. No evidence of acute osseous abnormality. IMPRESSION: Mild streaky left basilar opacities, favor atelectasis. Electronically Signed   By: Feliberto Harts M.D.   On: 02/25/2021 10:02        Scheduled Meds:  buPROPion  150 mg Oral q AM   busPIRone  10 mg Oral Daily   busPIRone  20 mg Oral QPM   clonazePAM  0.5 mg Oral QHS   DULoxetine  60 mg Oral BID   enoxaparin (LOVENOX) injection  40 mg Subcutaneous Q24H   estradiol  1.5 mg Oral QHS   pantoprazole  40 mg Oral Daily   QUEtiapine  50 mg Oral QHS   Continuous Infusions:  sodium chloride Stopped (02/25/21 2100)   azithromycin     cefTRIAXone (ROCEPHIN)  IV       LOS: 0 days    Time spent: 33 mins     Charise Killian, MD [facility] Pager [phone]  If 7PM-7AM, please contact night-coverage 02/26/2021, 6:53 AM

## 2021-02-26 NOTE — Progress Notes (Signed)
Inpatient Diabetes Program Recommendations  AACE/ADA: New Consensus Statement on Inpatient Glycemic Control   Target Ranges:  Prepandial:   less than 140 mg/dL      Peak postprandial:   less than 180 mg/dL (1-2 hours)      Critically ill patients:  140 - 180 mg/dL    Latest Reference Range & Units 02/25/21 09:40 02/26/21 05:18  Glucose 70 - 99 mg/dL 093 (H) 267 (H)    Latest Reference Range & Units 07/31/18 08:54  Hemoglobin A1C 4.8 - 5.6 % 6.6 (H)   Review of Glycemic Control  Diabetes history: No DM hx noted; noted hyperglycemia per note by A. [employer], [place] on 09/20/2018 Outpatient Diabetes medications: None Current orders for Inpatient glycemic control: None  Inpatient Diabetes Program Recommendations:    Insulin: Please consider ordering CBGs with Novolog 0-9 units TID with meals and Novolog 0-5 units QHS.  HbgA1C: Please consider ordering an A1C to evaluate glycemic control over the past 2-3 months.  NOTE: Admitted with pneumonia and sepsis. Initial lab glucose 150 mg/dl on 01/19/43. Patient received Solumedrol 125 mg x1 on 02/25/21 and lab glucose 336 mg/dl today. In reviewing chart, noted A1C of 6.6% on 07/31/18 but no official dx of DM noted in chart. Noted clinical support note on 10/10/18 by A. Ardell Isaacs, [place] which notes A1C and hyperglycemia.  Thanks, Orlando Penner, RN, MSN, CDE Diabetes Coordinator Inpatient Diabetes Program [phone] (Team Pager from 8am to 5pm)

## 2021-02-26 NOTE — TOC Progression Note (Signed)
Transition of Care [facility]) - Progression Note    Patient Details  Name: Brandy Robinson MRN: [id number] Date of Birth: May 27, 1965  Transition of Care [facility]) CM/SW Contact  Marlowe Sax, RN Phone Number: 02/26/2021, 10:12 AM  Clinical Narrative:    Transition of Care [facility]) Screening Note   Patient Details  Name: Brandy Robinson Date of Birth: 06-Jun-1965   Transition of Care [facility]) CM/SW Contact:    Marlowe Sax, RN Phone Number: 02/26/2021, 10:12 AM    Transition of Care Department [facility]) has reviewed patient and no TOC needs have been identified at this time. We will continue to monitor patient advancement through interdisciplinary progression rounds. If new patient transition needs arise, please place a TOC consult.           Expected Discharge Plan and Services                                                 Social Determinants of Health (SDOH) Interventions    Readmission Risk Interventions No flowsheet data found.

## 2021-02-27 DIAGNOSIS — E669 Obesity, unspecified: Secondary | ICD-10-CM

## 2021-02-27 DIAGNOSIS — J129 Viral pneumonia, unspecified: Secondary | ICD-10-CM

## 2021-02-27 LAB — BASIC METABOLIC PANEL
Anion gap: 9 (ref 5–15)
BUN: 16 mg/dL (ref 6–20)
CO2: 28 mmol/L (ref 22–32)
Calcium: 8.8 mg/dL — ABNORMAL LOW (ref 8.9–10.3)
Chloride: 100 mmol/L (ref 98–111)
Creatinine, Ser: 0.66 mg/dL (ref 0.44–1.00)
GFR, Estimated: >60 mL/min (ref >60)
Glucose, Bld: 125 mg/dL — ABNORMAL HIGH (ref 70–99)
Potassium: 3.7 mmol/L (ref 3.5–5.1)
Sodium: 137 mmol/L (ref 135–145)

## 2021-02-27 LAB — CBC
HCT: 35.6 % — ABNORMAL LOW (ref 36.0–46.0)
Hemoglobin: 11.8 g/dL — ABNORMAL LOW (ref 12.0–15.0)
MCH: 30.7 pg (ref 26.0–34.0)
MCHC: 33.1 g/dL (ref 30.0–36.0)
MCV: 92.7 fL (ref 80.0–100.0)
Platelets: 366 10*3/uL (ref 150–400)
RBC: 3.84 MIL/uL — ABNORMAL LOW (ref 3.87–5.11)
RDW: 13.7 % (ref 11.5–15.5)
WBC: 14.2 10*3/uL — ABNORMAL HIGH (ref 4.0–10.5)
nRBC: 0 % (ref 0.0–0.2)

## 2021-02-27 LAB — GLUCOSE, CAPILLARY: Glucose-Capillary: 125 mg/dL — ABNORMAL HIGH (ref 70–99)

## 2021-02-27 MED ORDER — ALBUTEROL SULFATE HFA 108 (90 BASE) MCG/ACT IN AERS: 2.0000 | INHALATION_SPRAY | Freq: Four times a day (QID) | RESPIRATORY_TRACT | 0 refills | Status: DC | PRN

## 2021-02-27 MED ORDER — AZITHROMYCIN 250 MG PO TABS: 250.0000 mg | ORAL_TABLET | Freq: Every day | ORAL | 0 refills | Status: AC

## 2021-02-27 NOTE — Discharge Summary (Signed)
Physician Discharge Summary  Brandy Robinson [id number] DOB: [birth year] DOA: 02/25/2021  PCP: Frazier Richards, MD  Admit date: 02/25/2021 Discharge date: 02/27/2021  Admitted From: home  Disposition:  home   Recommendations for Outpatient Follow-up:  Follow up with PCP in 1-2 weeks   Home Health: no  Equipment/Devices:  Discharge Condition: stable  CODE STATUS:full  Diet recommendation: regular   Brief/Interim Summary: HPI was taken from Dr. Doristine Bosworth: Brandy Robinson is a 56 y.o. female with medical history significant of bipolar 1 disorder, endometriosis, GERD presented to ED with a complaint of cough and shortness of breath which started about a week ago.  She is producing green-colored sputum.  She denies any fever.  She also has pleuritic chest pain which is likely secondary to cough.  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  No other complaint.   ED Course: Upon arrival to ED, she was tachycardic and tachypneic.  Has leukocytosis.  She is afebrile.  Chest x-ray shows left basilar opacity but radiology favors atelectasis.  Procalcitonin unremarkable.  She was tested negative for COVID and flu.  Based on all these, ED physician thought that patient has pneumonia, she was given Rocephin and Zithromax, hospitalist were called for admission.  Per ED physician, tried to discharge her home however her oxygen saturation dropped to 90% on room air.  Hospital course from Dr. Jimmye Norman: Pt was found to have viral pneumonia vs viral URI. Rhinovirus/enterovirus was positive but COVID19 and influenza were both neg. Pt did not require supplemental oxygen during her hospital stay. Pt was ambulating and transferring independently so therapy was not needed.   Discharge Diagnoses:  Principal Problem:   CAP (community acquired pneumonia) Active Problems:   Sepsis ([place])    Viral pneumonia vs viral URI: rhinovirus/enterovirus is positive. COVID19 and influenza were both  neg. Continue w/ bronchodilators, encourage incentive spirometry and d/c IV rocephin. Strep was neg, legionella is pending. Continue w/ supportive care. Pro-cal < 0.10. Blood cxs NGTD   GERD: continue on PPI   Bipolar 1 disorder: continue on home dose of seroquel, duloxetine, buspirone, & bupropion.   Chest pain: Likely pleuritic.  Troponins negative.  Tylenol as needed  Obesity: BMI 30.2. Would benefit from weight loss   Discharge Instructions  Discharge Instructions     Diet general   Complete by: As directed    Discharge instructions   Complete by: As directed    F/u w/ PCP in 1-2 weeks   Increase activity slowly   Complete by: As directed       Allergies as of 02/27/2021       Reactions   Hydrocodone Itching   Aspirin Other (See Comments)   Reaction:  GI upset    Morphine And Related Itching, Nausea And Vomiting, Other (See Comments)   Reaction:  Chest pain        Medication List     STOP taking these medications    cyclobenzaprine 10 MG tablet Commonly known as: FLEXERIL   Fluticasone-Salmeterol 100-50 MCG/DOSE Aepb Commonly known as: ADVAIR       TAKE these medications    albuterol 108 (90 Base) MCG/ACT inhaler Commonly known as: VENTOLIN HFA Inhale 2 puffs into the lungs every 6 (six) hours as needed for wheezing or shortness of breath.   azithromycin 250 MG tablet Commonly known as: ZITHROMAX Take 1 tablet (250 mg total) by mouth daily for 2 days.   buPROPion 150 MG  12 hr tablet Commonly known as: Wellbutrin SR Take 1 tablet (150 mg total) by mouth once daily in the morning.   busPIRone 10 MG tablet Commonly known as: BUSPAR Take one tablet (10 mg total) by mouth each morning and 2 tablets (20 mg total) each evening   clonazePAM 0.5 MG tablet Commonly known as: KLONOPIN Take 0.5 mg by mouth at bedtime as needed. What changed: Another medication with the same name was removed. Continue taking this medication, and follow the directions you  see here.   Cymbalta 30 MG capsule Generic drug: DULoxetine Take 2 capsules (60 mg total) by mouth 2 (two) times daily.   estradiol 1 MG tablet Commonly known as: ESTRACE TAKE 1 1/2 TABLETS (1.5MG  TOTAL) BY MOUTH ONCE DAILY   ondansetron 4 MG disintegrating tablet Commonly known as: ZOFRAN-ODT Dissolve 1-2 tablets by mouth once every 8 hours as needed for nausea.   pantoprazole 40 MG tablet Commonly known as: PROTONIX TAKE ONE TABLET BY MOUTH EVERY DAY FOR REFLUX OR HEARTBURN.   QUEtiapine 50 MG tablet Commonly known as: SEROquel Take 1 tablet (50 mg total) by mouth once daily at bedtime.   traMADol 50 MG tablet Commonly known as: ULTRAM Take 50 mg by mouth 2 (two) times daily as needed.        Allergies  Allergen Reactions   Hydrocodone Itching   Aspirin Other (See Comments)    Reaction:  GI upset    Morphine And Related Itching, Nausea And Vomiting and Other (See Comments)    Reaction:  Chest pain    Consultations:    Procedures/Studies: DG Chest 2 View  Result Date: 02/25/2021 CLINICAL DATA:  cough, chest pain EXAM: CHEST - 2 VIEW COMPARISON:  March 18, 2018. FINDINGS: Mild streaky left basilar opacities. No confluent consolidation. No visible pleural effusions or pneumothorax. Cardiomediastinal silhouette is within normal limits and similar to prior. No evidence of acute osseous abnormality. IMPRESSION: Mild streaky left basilar opacities, favor atelectasis. Electronically Signed   By: Margaretha Sheffield M.D.   On: 02/25/2021 10:02   (Echo, Carotid, EGD, Colonoscopy, ERCP)    Subjective: Pt c/o intermittent cough   Discharge Exam: Vitals:   02/27/21 0500 02/27/21 0824  BP: (!) 150/85 (!) 155/82  Pulse: 86 93  Resp: 18 20  Temp: 97.8 F (36.6 C) 98.3 F (36.8 C)  SpO2: 95% 93%   Vitals:   02/26/21 0742 02/26/21 2217 02/27/21 0500 02/27/21 0824  BP: 132/78 (!) 157/77 (!) 150/85 (!) 155/82  Pulse: 89 99 86 93  Resp: 16 18 18 20   Temp: 97.9 F (36.6  C) 97.7 F (36.5 C) 97.8 F (36.6 C) 98.3 F (36.8 C)  TempSrc:  Oral    SpO2: 92% 96% 95% 93%  Weight:      Height:        General: Pt is alert, awake, not in acute distress Cardiovascular: S1/S2 +, no rubs, no gallops Respiratory: diminished breath sounds b/l otherwise clear Abdominal: Soft, NT, obese, bowel sounds + Extremities: no edema, no cyanosis    The results of significant diagnostics from this hospitalization (including imaging, microbiology, ancillary and laboratory) are listed below for reference.     Microbiology: Recent Results (from the past 240 hour(s))  Resp Panel by RT-PCR (Flu A&B, Covid) Nasopharyngeal Swab     Status: None   Collection Time: 02/25/21 10:07 AM   Specimen: Nasopharyngeal Swab; Nasopharyngeal(NP) swabs in vial transport medium  Result Value Ref Range Status   SARS Coronavirus  2 by RT PCR NEGATIVE NEGATIVE Final    Comment: (NOTE) SARS-CoV-2 target nucleic acids are NOT DETECTED.  The SARS-CoV-2 RNA is generally detectable in upper respiratory specimens during the acute phase of infection. The lowest concentration of SARS-CoV-2 viral copies this assay can detect is 138 copies/mL. A negative result does not preclude SARS-Cov-2 infection and should not be used as the sole basis for treatment or other patient management decisions. A negative result may occur with  improper specimen collection/handling, submission of specimen other than nasopharyngeal swab, presence of viral mutation(s) within the areas targeted by this assay, and inadequate number of viral copies(<138 copies/mL). A negative result must be combined with clinical observations, patient history, and epidemiological information. The expected result is Negative.  Fact Sheet for Patients:  EntrepreneurPulse.com.au  Fact Sheet for Healthcare Providers:  IncredibleEmployment.be  This test is no t yet approved or cleared by the [place]  FDA and  has been authorized for detection and/or diagnosis of SARS-CoV-2 by FDA under an Emergency Use Authorization (EUA). This EUA will remain  in effect (meaning this test can be used) for the duration of the COVID-19 declaration under Section 564(b)(1) of the Act, 21 U.S.C.section 360bbb-3(b)(1), unless the authorization is terminated  or revoked sooner.       Influenza A by PCR NEGATIVE NEGATIVE Final   Influenza B by PCR NEGATIVE NEGATIVE Final    Comment: (NOTE) The Xpert Xpress SARS-CoV-2/FLU/RSV plus assay is intended as an aid in the diagnosis of influenza from Nasopharyngeal swab specimens and should not be used as a sole basis for treatment. Nasal washings and aspirates are unacceptable for Xpert Xpress SARS-CoV-2/FLU/RSV testing.  Fact Sheet for Patients: EntrepreneurPulse.com.au  Fact Sheet for Healthcare Providers: IncredibleEmployment.be  This test is not yet approved or cleared by the [place] FDA and has been authorized for detection and/or diagnosis of SARS-CoV-2 by FDA under an Emergency Use Authorization (EUA). This EUA will remain in effect (meaning this test can be used) for the duration of the COVID-19 declaration under Section 564(b)(1) of the Act, 21 U.S.C. section 360bbb-3(b)(1), unless the authorization is terminated or revoked.  Performed at [facility], [place], [place] [address]   Group A Strep by PCR [facility] Only)     Status: None   Collection Time: 02/25/21 10:19 AM   Specimen: Throat; Sterile Swab  Result Value Ref Range Status   Group A Strep by PCR NOT DETECTED NOT DETECTED Final    Comment: Performed at [facility], [address] [place], [place] [address]  Blood Culture (routine x 2)     Status: None (Preliminary result)   Collection Time: 02/25/21 10:19 AM   Specimen: BLOOD  Result Value Ref Range Status   Specimen Description BLOOD LEFT [facility]  Final    Special Requests   Final    BOTTLES DRAWN AEROBIC AND ANAEROBIC Blood Culture adequate volume   Culture   Final    NO GROWTH 2 DAYS Performed at [facility], [address], [place], [place] [address]    Report Status PENDING  Incomplete  Blood Culture (routine x 2)     Status: None (Preliminary result)   Collection Time: 02/25/21 10:19 AM   Specimen: BLOOD  Result Value Ref Range Status   Specimen Description BLOOD RIGHT [facility]  Final   Special Requests   Final    BOTTLES DRAWN AEROBIC AND ANAEROBIC Blood Culture adequate volume   Culture   Final  NO GROWTH 2 DAYS Performed at [facility], [address] [place], [place] [address]    Report Status PENDING  Incomplete  Respiratory (~20 pathogens) panel by PCR     Status: Abnormal   Collection Time: 02/25/21  3:25 PM   Specimen: Nasopharyngeal Swab; Respiratory  Result Value Ref Range Status   Adenovirus NOT DETECTED NOT DETECTED Final   Coronavirus 229E NOT DETECTED NOT DETECTED Final    Comment: (NOTE) The Coronavirus on the Respiratory Panel, DOES NOT test for the novel  Coronavirus (2019 nCoV)    Coronavirus HKU1 NOT DETECTED NOT DETECTED Final   Coronavirus NL63 NOT DETECTED NOT DETECTED Final   Coronavirus OC43 NOT DETECTED NOT DETECTED Final   Metapneumovirus NOT DETECTED NOT DETECTED Final   Rhinovirus / Enterovirus DETECTED (A) NOT DETECTED Final   Influenza A NOT DETECTED NOT DETECTED Final   Influenza B NOT DETECTED NOT DETECTED Final   Parainfluenza Virus 1 NOT DETECTED NOT DETECTED Final   Parainfluenza Virus 2 NOT DETECTED NOT DETECTED Final   Parainfluenza Virus 3 NOT DETECTED NOT DETECTED Final   Parainfluenza Virus 4 NOT DETECTED NOT DETECTED Final   Respiratory Syncytial Virus NOT DETECTED NOT DETECTED Final   Bordetella pertussis NOT DETECTED NOT DETECTED Final   Bordetella Parapertussis NOT DETECTED NOT DETECTED Final   Chlamydophila pneumoniae NOT DETECTED NOT DETECTED Final    Mycoplasma pneumoniae NOT DETECTED NOT DETECTED Final    Comment: Performed at [facility], [place], [place], [place] [address]     Labs: BNP (last 3 results) No results for input(s): BNP in the last 8760 hours. Basic Metabolic Panel: Recent Labs  Lab 02/25/21 0940 02/26/21 0518 02/27/21 0542  NA 134* 134* 137  K 3.8 4.2 3.7  CL 97* 98 100  CO2 29 26 28   GLUCOSE 150* 336* 125*  BUN 9 12 16   CREATININE 0.56 0.64 0.66  CALCIUM 8.9 9.2 8.8*   Liver Function Tests: Recent Labs  Lab 02/25/21 0940 02/26/21 0518  AST 19 20  ALT 21 20  ALKPHOS 171* 205*  BILITOT 0.3 0.3  PROT 7.2 7.6  ALBUMIN 3.3* 3.3*   No results for input(s): LIPASE, AMYLASE in the last 168 hours. No results for input(s): AMMONIA in the last 168 hours. CBC: Recent Labs  Lab 02/25/21 0940 02/26/21 0518 02/27/21 0542  WBC 13.8* 10.2 14.2*  HGB 12.0 12.5 11.8*  HCT 36.0 37.8 35.6*  MCV 91.4 90.6 92.7  PLT 345 345 366   Cardiac Enzymes: No results for input(s): CKTOTAL, CKMB, CKMBINDEX, TROPONINI in the last 168 hours. BNP: Invalid input(s): POCBNP CBG: Recent Labs  Lab 02/26/21 1418 02/26/21 1654 02/26/21 2210 02/27/21 0823  GLUCAP 302* 151* 198* 125*   D-Dimer Recent Labs    02/25/21 1019  DDIMER 0.33   Hgb A1c Recent Labs    02/26/21 0518  HGBA1C 6.6*   Lipid Profile No results for input(s): CHOL, HDL, LDLCALC, TRIG, CHOLHDL, LDLDIRECT in the last 72 hours. Thyroid function studies Recent Labs    02/25/21 0940  TSH 1.668   Anemia work up No results for input(s): VITAMINB12, FOLATE, FERRITIN, TIBC, IRON, RETICCTPCT in the last 72 hours. Urinalysis    Component Value Date/Time   COLORURINE STRAW (A) 02/25/2021 1019   APPEARANCEUR CLEAR (A) 02/25/2021 1019   APPEARANCEUR Clear 05/15/2014 2120   LABSPEC 1.004 (L) 02/25/2021 1019   LABSPEC 1.006 05/15/2014 2120   PHURINE 7.0 02/25/2021 1019   GLUCOSEU NEGATIVE  02/25/2021 1019   GLUCOSEU Negative  05/15/2014 2120   HGBUR NEGATIVE 02/25/2021 1019   BILIRUBINUR NEGATIVE 02/25/2021 1019   BILIRUBINUR Negative 05/15/2014 2120   KETONESUR NEGATIVE 02/25/2021 1019   PROTEINUR NEGATIVE 02/25/2021 1019   NITRITE NEGATIVE 02/25/2021 1019   LEUKOCYTESUR SMALL (A) 02/25/2021 1019   LEUKOCYTESUR Trace 05/15/2014 2120   Sepsis Labs Invalid input(s): PROCALCITONIN,  WBC,  LACTICIDVEN Microbiology Recent Results (from the past 240 hour(s))  Resp Panel by RT-PCR (Flu A&B, Covid) Nasopharyngeal Swab     Status: None   Collection Time: 02/25/21 10:07 AM   Specimen: Nasopharyngeal Swab; Nasopharyngeal(NP) swabs in vial transport medium  Result Value Ref Range Status   SARS Coronavirus 2 by RT PCR NEGATIVE NEGATIVE Final    Comment: (NOTE) SARS-CoV-2 target nucleic acids are NOT DETECTED.  The SARS-CoV-2 RNA is generally detectable in upper respiratory specimens during the acute phase of infection. The lowest concentration of SARS-CoV-2 viral copies this assay can detect is 138 copies/mL. A negative result does not preclude SARS-Cov-2 infection and should not be used as the sole basis for treatment or other patient management decisions. A negative result may occur with  improper specimen collection/handling, submission of specimen other than nasopharyngeal swab, presence of viral mutation(s) within the areas targeted by this assay, and inadequate number of viral copies(<138 copies/mL). A negative result must be combined with clinical observations, patient history, and epidemiological information. The expected result is Negative.  Fact Sheet for Patients:  EntrepreneurPulse.com.au  Fact Sheet for Healthcare Providers:  IncredibleEmployment.be  This test is no t yet approved or cleared by the [place] FDA and  has been authorized for detection and/or diagnosis of SARS-CoV-2 by FDA under an Emergency Use Authorization (EUA). This EUA will remain  in  effect (meaning this test can be used) for the duration of the COVID-19 declaration under Section 564(b)(1) of the Act, 21 U.S.C.section 360bbb-3(b)(1), unless the authorization is terminated  or revoked sooner.       Influenza A by PCR NEGATIVE NEGATIVE Final   Influenza B by PCR NEGATIVE NEGATIVE Final    Comment: (NOTE) The Xpert Xpress SARS-CoV-2/FLU/RSV plus assay is intended as an aid in the diagnosis of influenza from Nasopharyngeal swab specimens and should not be used as a sole basis for treatment. Nasal washings and aspirates are unacceptable for Xpert Xpress SARS-CoV-2/FLU/RSV testing.  Fact Sheet for Patients: EntrepreneurPulse.com.au  Fact Sheet for Healthcare Providers: IncredibleEmployment.be  This test is not yet approved or cleared by the [place] FDA and has been authorized for detection and/or diagnosis of SARS-CoV-2 by FDA under an Emergency Use Authorization (EUA). This EUA will remain in effect (meaning this test can be used) for the duration of the COVID-19 declaration under Section 564(b)(1) of the Act, 21 U.S.C. section 360bbb-3(b)(1), unless the authorization is terminated or revoked.  Performed at [facility], [place], [place] [address]   Group A Strep by PCR [facility] Only)     Status: None   Collection Time: 02/25/21 10:19 AM   Specimen: Throat; Sterile Swab  Result Value Ref Range Status   Group A Strep by PCR NOT DETECTED NOT DETECTED Final    Comment: Performed at [facility], [address] [place], [place] [address]  Blood Culture (routine x 2)     Status: None (Preliminary result)   Collection Time: 02/25/21 10:19 AM   Specimen: BLOOD  Result Value Ref Range Status   Specimen Description BLOOD LEFT AC  Final   Special Requests   Final    BOTTLES DRAWN AEROBIC AND ANAEROBIC Blood Culture adequate volume   Culture   Final    NO GROWTH 2 DAYS Performed at [facility], [address] [place], [place] [address]    Report Status PENDING  Incomplete  Blood Culture (routine x 2)     Status: None (Preliminary result)   Collection Time: 02/25/21 10:19 AM   Specimen: BLOOD  Result Value Ref Range Status   Specimen Description BLOOD RIGHT [facility]  Final   Special Requests   Final    BOTTLES DRAWN AEROBIC AND ANAEROBIC Blood Culture adequate volume   Culture   Final    NO GROWTH 2 DAYS Performed at [facility], [address] [place], [place] [address]    Report Status PENDING  Incomplete  Respiratory (~20 pathogens) panel by PCR     Status: Abnormal   Collection Time: 02/25/21  3:25 PM   Specimen: Nasopharyngeal Swab; Respiratory  Result Value Ref Range Status   Adenovirus NOT DETECTED NOT DETECTED Final   Coronavirus 229E NOT DETECTED NOT DETECTED Final    Comment: (NOTE) The Coronavirus on the Respiratory Panel, DOES NOT test for the novel  Coronavirus (2019 nCoV)    Coronavirus HKU1 NOT DETECTED NOT DETECTED Final   Coronavirus NL63 NOT DETECTED NOT DETECTED Final   Coronavirus OC43 NOT DETECTED NOT DETECTED Final   Metapneumovirus NOT DETECTED NOT DETECTED Final   Rhinovirus / Enterovirus DETECTED (A) NOT DETECTED Final   Influenza A NOT DETECTED NOT DETECTED Final   Influenza B NOT DETECTED NOT DETECTED Final   Parainfluenza Virus 1 NOT DETECTED NOT DETECTED Final   Parainfluenza Virus 2 NOT DETECTED NOT DETECTED Final   Parainfluenza Virus 3 NOT DETECTED NOT DETECTED Final   Parainfluenza Virus 4 NOT DETECTED NOT DETECTED Final   Respiratory Syncytial Virus NOT DETECTED NOT DETECTED Final   Bordetella pertussis NOT DETECTED NOT DETECTED Final   Bordetella Parapertussis NOT DETECTED NOT DETECTED Final   Chlamydophila pneumoniae NOT DETECTED NOT DETECTED Final   Mycoplasma pneumoniae NOT DETECTED NOT DETECTED Final    Comment: Performed at [facility], [place]. [address], [place], [place] [address]     Time  coordinating discharge: Over 30 minutes  SIGNED:   Wyvonnia Dusky, MD  [facility] 02/27/2021, 11:08 AM Pager   If 7PM-7AM, please contact night-coverage

## 2021-02-27 NOTE — Progress Notes (Signed)
Patient was given verbal and written discharge  instruction , she acknowledge understanding, and states she will comply. Patient was given prescription. Patient was taken to car by wheelchair, no distress noted when leaving the floor.

## 2021-03-01 ENCOUNTER — Other Ambulatory Visit: Payer: Self-pay

## 2021-03-01 LAB — LEGIONELLA PNEUMOPHILA SEROGP 1 UR AG: L. pneumophila Serogp 1 Ur Ag: NEGATIVE

## 2021-03-01 MED ORDER — AZITHROMYCIN 250 MG PO TABS
ORAL_TABLET | ORAL | 0 refills | Status: DC
  Filled 2021-03-01: qty 2, 2d supply, fill #0

## 2021-03-01 MED ORDER — ALBUTEROL SULFATE HFA 108 (90 BASE) MCG/ACT IN AERS
2.0000 | INHALATION_SPRAY | Freq: Four times a day (QID) | RESPIRATORY_TRACT | 0 refills | Status: DC | PRN
  Filled 2021-03-01: qty 8.5, 25d supply, fill #0

## 2021-03-02 ENCOUNTER — Other Ambulatory Visit: Payer: Self-pay

## 2021-03-02 LAB — CULTURE, BLOOD (ROUTINE X 2)
Culture: NO GROWTH
Culture: NO GROWTH
Special Requests: ADEQUATE
Special Requests: ADEQUATE

## 2021-03-03 ENCOUNTER — Other Ambulatory Visit: Payer: Self-pay

## 2021-03-03 MED ORDER — FLUTICASONE-SALMETEROL 250-50 MCG/ACT IN AEPB
INHALATION_SPRAY | Freq: Two times a day (BID) | RESPIRATORY_TRACT | 3 refills | Status: DC
  Filled 2021-03-03: qty 60, 30d supply, fill #0
  Filled 2021-04-28 – 2021-06-26 (×3): qty 60, 30d supply, fill #1
  Filled 2021-06-29 – 2021-12-22 (×3): qty 60, 30d supply, fill #0

## 2021-03-03 MED ORDER — PREDNISONE 10 MG PO TABS
50.0000 mg | ORAL_TABLET | Freq: Every day | ORAL | 0 refills | Status: DC
  Filled 2021-03-03: qty 25, 5d supply, fill #0

## 2021-03-04 ENCOUNTER — Telehealth: Payer: Self-pay | Admitting: Pharmacist

## 2021-03-04 NOTE — Telephone Encounter (Signed)
03/04/2021 9:13:47 AM - Advair 250-50 forms to pt. & dr. Arletha Pili - Thursday, March 04, 2021 9:11 AM --  Received printout from pharmacy for Advair 250-50. Forms mailed to dr & pt. Also mailed [phone] recertification app to pt requesting POI & taxes.

## 2021-03-10 ENCOUNTER — Other Ambulatory Visit: Payer: Self-pay

## 2021-03-12 ENCOUNTER — Other Ambulatory Visit: Payer: Self-pay

## 2021-03-26 ENCOUNTER — Other Ambulatory Visit: Payer: Self-pay

## 2021-04-05 ENCOUNTER — Telehealth: Payer: Self-pay | Admitting: Pharmacy Technician

## 2021-04-05 ENCOUNTER — Other Ambulatory Visit: Payer: Self-pay

## 2021-04-05 ENCOUNTER — Telehealth: Payer: Self-pay | Admitting: Pharmacist

## 2021-04-05 NOTE — Telephone Encounter (Signed)
Patient still needs to provide last 30 days of paystubs.  Patient verbally acknowledged that she understood that failure to provide requested financial documentation within 30 days could cause [facility]'s inability to provide medication assistance. ? ?Brandy Robinson ?Care Manager ?[facility] ?

## 2021-04-05 NOTE — Telephone Encounter (Signed)
04/05/2021 10:34:35 AM - Advair Pending ?-- Tarry Kos - Monday, April 05, 2021 10:32 AM --  ?Received signed copy from provider & pt for Advair. Now holding for pt's POI. ?

## 2021-04-07 ENCOUNTER — Other Ambulatory Visit: Payer: Self-pay

## 2021-04-28 ENCOUNTER — Other Ambulatory Visit: Payer: Self-pay

## 2021-04-29 ENCOUNTER — Other Ambulatory Visit: Payer: Self-pay

## 2021-04-30 ENCOUNTER — Other Ambulatory Visit: Payer: Self-pay

## 2021-04-30 MED ORDER — DULOXETINE HCL 30 MG PO CPEP
ORAL_CAPSULE | Freq: Two times a day (BID) | ORAL | 2 refills | Status: DC
  Filled 2021-06-09: qty 120, 30d supply, fill #0
  Filled 2021-07-13: qty 120, 30d supply, fill #1
  Filled 2021-08-12: qty 120, 30d supply, fill #2

## 2021-04-30 MED ORDER — BUPROPION HCL ER (SR) 150 MG PO TB12
150.0000 mg | ORAL_TABLET | Freq: Every morning | ORAL | 2 refills | Status: DC
  Filled 2021-04-30: qty 30, 30d supply, fill #0
  Filled 2021-05-04: qty 30, fill #0
  Filled 2021-05-08: qty 30, 30d supply, fill #0
  Filled 2021-06-01 – 2021-06-07 (×4): qty 30, 30d supply, fill #1
  Filled 2021-06-08: qty 30, 30d supply, fill #0
  Filled 2021-07-01: qty 30, 30d supply, fill #1

## 2021-04-30 MED ORDER — QUETIAPINE FUMARATE 50 MG PO TABS
ORAL_TABLET | Freq: Every evening | ORAL | 2 refills | Status: DC
  Filled 2021-05-04: qty 30, 30d supply, fill #0
  Filled 2021-06-01: qty 30, 30d supply, fill #1
  Filled 2021-06-29: qty 30, 30d supply, fill #2
  Filled 2021-06-29: qty 30, 30d supply, fill #0

## 2021-04-30 MED ORDER — BUSPIRONE HCL 10 MG PO TABS
ORAL_TABLET | ORAL | 2 refills | Status: DC
  Filled 2021-05-10: qty 90, 30d supply, fill #0
  Filled 2021-06-01 – 2021-06-07 (×4): qty 90, 30d supply, fill #1
  Filled 2021-06-08: qty 90, 30d supply, fill #0
  Filled 2021-07-01: qty 90, 30d supply, fill #1

## 2021-05-04 ENCOUNTER — Other Ambulatory Visit: Payer: Self-pay

## 2021-05-05 ENCOUNTER — Other Ambulatory Visit: Payer: Self-pay

## 2021-05-10 ENCOUNTER — Other Ambulatory Visit: Payer: Self-pay

## 2021-05-11 ENCOUNTER — Other Ambulatory Visit: Payer: Self-pay

## 2021-05-17 ENCOUNTER — Telehealth: Payer: Self-pay | Admitting: Pharmacy Technician

## 2021-05-17 NOTE — Telephone Encounter (Signed)
Received paystubs.  Patient eligible to receive medication assistance at [facility] until time for re-certification in 5208, and as long as eligibility requirements continue to be met. ? ?Also, processed Advair PAP application.  Sent to [facility]. ? ?Jacquelynn Cree ?Care Manager ?[facility]  ?

## 2021-05-21 ENCOUNTER — Other Ambulatory Visit: Payer: Self-pay

## 2021-05-31 ENCOUNTER — Other Ambulatory Visit: Payer: Self-pay

## 2021-05-31 MED ORDER — CEPHALEXIN 500 MG PO CAPS
ORAL_CAPSULE | Freq: Two times a day (BID) | ORAL | 0 refills | Status: DC
  Filled 2021-05-31: qty 14, 7d supply, fill #0

## 2021-05-31 MED ORDER — GABAPENTIN 300 MG PO CAPS
900.0000 mg | ORAL_CAPSULE | Freq: Three times a day (TID) | ORAL | 0 refills | Status: DC
  Filled 2021-05-31: qty 90, 30d supply, fill #0

## 2021-05-31 MED ORDER — FLUCONAZOLE 150 MG PO TABS
ORAL_TABLET | ORAL | 0 refills | Status: DC
  Filled 2021-05-31: qty 2, 3d supply, fill #0

## 2021-05-31 MED ORDER — LISINOPRIL 10 MG PO TABS
10.0000 mg | ORAL_TABLET | Freq: Every day | ORAL | 2 refills | Status: DC
  Filled 2021-05-31: qty 30, 30d supply, fill #0
  Filled 2021-06-26: qty 30, 30d supply, fill #1
  Filled 2021-06-29: qty 30, 30d supply, fill #0

## 2021-06-01 ENCOUNTER — Other Ambulatory Visit: Payer: Self-pay

## 2021-06-02 ENCOUNTER — Other Ambulatory Visit: Payer: Self-pay

## 2021-06-03 ENCOUNTER — Other Ambulatory Visit: Payer: Self-pay

## 2021-06-04 ENCOUNTER — Other Ambulatory Visit: Payer: Self-pay

## 2021-06-08 ENCOUNTER — Other Ambulatory Visit: Payer: Self-pay

## 2021-06-09 ENCOUNTER — Other Ambulatory Visit: Payer: Self-pay

## 2021-06-26 ENCOUNTER — Other Ambulatory Visit: Payer: Self-pay

## 2021-06-29 ENCOUNTER — Other Ambulatory Visit: Payer: Self-pay

## 2021-06-30 ENCOUNTER — Other Ambulatory Visit: Payer: Self-pay

## 2021-06-30 MED ORDER — NAPROXEN 500 MG PO TABS
ORAL_TABLET | ORAL | 3 refills | Status: DC
  Filled 2021-06-30: qty 60, 30d supply, fill #0

## 2021-06-30 MED ORDER — ASPIRIN 81 MG PO TBEC
DELAYED_RELEASE_TABLET | ORAL | 3 refills | Status: DC
  Filled 2021-07-13: qty 120, 120d supply, fill #0
  Filled 2021-07-28: qty 120, fill #0

## 2021-06-30 MED ORDER — ATORVASTATIN CALCIUM 20 MG PO TABS
ORAL_TABLET | ORAL | 3 refills | Status: DC
  Filled 2021-06-30: qty 90, 90d supply, fill #0

## 2021-06-30 MED ORDER — LISINOPRIL 20 MG PO TABS
20.0000 mg | ORAL_TABLET | Freq: Every day | ORAL | 2 refills | Status: DC
  Filled 2021-06-30: qty 90, 90d supply, fill #0

## 2021-07-01 ENCOUNTER — Other Ambulatory Visit: Payer: Self-pay

## 2021-07-02 ENCOUNTER — Other Ambulatory Visit: Payer: Self-pay

## 2021-07-13 ENCOUNTER — Other Ambulatory Visit: Payer: Self-pay

## 2021-07-21 ENCOUNTER — Other Ambulatory Visit: Payer: Self-pay

## 2021-07-27 ENCOUNTER — Other Ambulatory Visit: Payer: Self-pay

## 2021-07-29 ENCOUNTER — Other Ambulatory Visit: Payer: Self-pay

## 2021-08-12 ENCOUNTER — Other Ambulatory Visit: Payer: Self-pay

## 2021-08-17 ENCOUNTER — Other Ambulatory Visit: Payer: Self-pay

## 2021-08-19 ENCOUNTER — Other Ambulatory Visit: Payer: Self-pay

## 2021-08-20 ENCOUNTER — Other Ambulatory Visit: Payer: Self-pay

## 2021-08-25 ENCOUNTER — Other Ambulatory Visit: Payer: Self-pay

## 2021-09-28 ENCOUNTER — Other Ambulatory Visit: Payer: Self-pay

## 2021-10-24 ENCOUNTER — Other Ambulatory Visit: Payer: Self-pay

## 2021-10-25 ENCOUNTER — Other Ambulatory Visit: Payer: Self-pay

## 2021-10-26 ENCOUNTER — Other Ambulatory Visit: Payer: Self-pay

## 2021-10-28 ENCOUNTER — Other Ambulatory Visit: Payer: Self-pay

## 2021-11-04 ENCOUNTER — Encounter: Payer: Self-pay | Admitting: Emergency Medicine

## 2021-11-04 ENCOUNTER — Other Ambulatory Visit: Payer: Self-pay

## 2021-11-04 ENCOUNTER — Emergency Department
Admission: EM | Admit: 2021-11-04 | Discharge: 2021-11-04 | Disposition: A | Discharge status: Home / Self Care | Payer: Medicaid Other | Attending: Emergency Medicine | Admitting: Emergency Medicine

## 2021-11-04 ENCOUNTER — Emergency Department: Payer: Medicaid Other

## 2021-11-04 DIAGNOSIS — N390 Urinary tract infection, site not specified: Secondary | ICD-10-CM | POA: Insufficient documentation

## 2021-11-04 DIAGNOSIS — J449 Chronic obstructive pulmonary disease, unspecified: Secondary | ICD-10-CM | POA: Insufficient documentation

## 2021-11-04 DIAGNOSIS — I509 Heart failure, unspecified: Secondary | ICD-10-CM | POA: Insufficient documentation

## 2021-11-04 DIAGNOSIS — Z7951 Long term (current) use of inhaled steroids: Secondary | ICD-10-CM | POA: Insufficient documentation

## 2021-11-04 DIAGNOSIS — Z7982 Long term (current) use of aspirin: Secondary | ICD-10-CM | POA: Insufficient documentation

## 2021-11-04 DIAGNOSIS — Z79899 Other long term (current) drug therapy: Secondary | ICD-10-CM | POA: Insufficient documentation

## 2021-11-04 LAB — URINALYSIS, ROUTINE W REFLEX MICROSCOPIC
Bacteria, UA: NONE SEEN
Bilirubin Urine: NEGATIVE
Glucose, UA: NEGATIVE mg/dL
Ketones, ur: NEGATIVE mg/dL
Nitrite: NEGATIVE
Protein, ur: 30 mg/dL — AB
Specific Gravity, Urine: 1.016 (ref 1.005–1.030)
WBC, UA: >50 WBC/hpf — ABNORMAL HIGH (ref 0–5)
pH: 6 (ref 5.0–8.0)

## 2021-11-04 LAB — CBC WITH DIFFERENTIAL/PLATELET
Abs Immature Granulocytes: 0.02 10*3/uL (ref 0.00–0.07)
Basophils Absolute: 0.1 10*3/uL (ref 0.0–0.1)
Basophils Relative: 1 %
Eosinophils Absolute: 0.2 10*3/uL (ref 0.0–0.5)
Eosinophils Relative: 1 %
HCT: 39.9 % (ref 36.0–46.0)
Hemoglobin: 13.1 g/dL (ref 12.0–15.0)
Immature Granulocytes: 0 %
Lymphocytes Relative: 47 %
Lymphs Abs: 5.2 10*3/uL — ABNORMAL HIGH (ref 0.7–4.0)
MCH: 29.8 pg (ref 26.0–34.0)
MCHC: 32.8 g/dL (ref 30.0–36.0)
MCV: 90.7 fL (ref 80.0–100.0)
Monocytes Absolute: 0.7 10*3/uL (ref 0.1–1.0)
Monocytes Relative: 6 %
Neutro Abs: 5 10*3/uL (ref 1.7–7.7)
Neutrophils Relative %: 45 %
Platelets: 340 10*3/uL (ref 150–400)
RBC: 4.4 MIL/uL (ref 3.87–5.11)
RDW: 12.6 % (ref 11.5–15.5)
Smear Review: NORMAL
WBC: 11.2 10*3/uL — ABNORMAL HIGH (ref 4.0–10.5)
nRBC: 0 % (ref 0.0–0.2)

## 2021-11-04 LAB — COMPREHENSIVE METABOLIC PANEL
ALT: 21 U/L (ref 0–44)
AST: 32 U/L (ref 15–41)
Albumin: 3.7 g/dL (ref 3.5–5.0)
Alkaline Phosphatase: 123 U/L (ref 38–126)
Anion gap: 10 (ref 5–15)
BUN: 14 mg/dL (ref 6–20)
CO2: 26 mmol/L (ref 22–32)
Calcium: 9.5 mg/dL (ref 8.9–10.3)
Chloride: 100 mmol/L (ref 98–111)
Creatinine, Ser: 0.7 mg/dL (ref 0.44–1.00)
GFR, Estimated: >60 mL/min (ref >60)
Glucose, Bld: 145 mg/dL — ABNORMAL HIGH (ref 70–99)
Potassium: 4.1 mmol/L (ref 3.5–5.1)
Sodium: 136 mmol/L (ref 135–145)
Total Bilirubin: 0.5 mg/dL (ref 0.3–1.2)
Total Protein: 7 g/dL (ref 6.5–8.1)

## 2021-11-04 MED ORDER — ONDANSETRON 4 MG PO TBDP
4.0000 mg | ORAL_TABLET | Freq: Once | ORAL | Status: AC
  Administered 2021-11-04: 4 mg via ORAL
  Filled 2021-11-04: qty 1

## 2021-11-04 MED ORDER — IBUPROFEN 800 MG PO TABS
800.0000 mg | ORAL_TABLET | Freq: Once | ORAL | Status: AC
Start: 2021-11-04 — End: 2021-11-04
  Administered 2021-11-04: 800 mg via ORAL
  Filled 2021-11-04: qty 1

## 2021-11-04 MED ORDER — CEPHALEXIN 500 MG PO CAPS
500.0000 mg | ORAL_CAPSULE | Freq: Once | ORAL | Status: AC
  Administered 2021-11-04: 500 mg via ORAL
  Filled 2021-11-04: qty 1

## 2021-11-04 MED ORDER — ONDANSETRON 4 MG PO TBDP
4.0000 mg | ORAL_TABLET | Freq: Four times a day (QID) | ORAL | 0 refills | Status: DC | PRN
  Filled 2021-11-04: qty 20, 5d supply, fill #0

## 2021-11-04 MED ORDER — IBUPROFEN 800 MG PO TABS
800.0000 mg | ORAL_TABLET | Freq: Three times a day (TID) | ORAL | 0 refills | Status: DC | PRN
  Filled 2021-11-04: qty 30, 10d supply, fill #0

## 2021-11-04 MED ORDER — PHENAZOPYRIDINE HCL 100 MG PO TABS
100.0000 mg | ORAL_TABLET | Freq: Three times a day (TID) | ORAL | 0 refills | Status: DC | PRN
  Filled 2021-11-04 (×2): qty 10, 4d supply, fill #0

## 2021-11-04 MED ORDER — CEPHALEXIN 500 MG PO CAPS
500.0000 mg | ORAL_CAPSULE | Freq: Three times a day (TID) | ORAL | 0 refills | Status: AC
  Filled 2021-11-04: qty 21, 7d supply, fill #0

## 2021-11-04 NOTE — ED Provider Notes (Signed)
[facility] Provider Note    Event Date/Time   First MD Initiated Contact with Patient 11/04/21 [phone]     (approximate)   History   Back Pain   HPI  Brandy Robinson is a 56 y.o. female with history of COPD, CHF, bipolar disorder, anxiety who presents to the emergency department with complaints of dysuria and lower back pain ongoing for several days.  No fevers, vomiting or diarrhea.  No abnormal vaginal bleeding or discharge.  No history of kidney stones.   History provided by patient.    Past Medical History:  Diagnosis Date   Anxiety    Bipolar disorder (HCC)    CHF (congestive heart failure) (HCC)    Depression    Disc disorder    Dyspareunia    Endometriosis    GERD (gastroesophageal reflux disease)    Insomnia    Lichen    Surgical menopause    Vaginal atrophy    Wears glasses     Past Surgical History:  Procedure Laterality Date   ABDOMINAL HYSTERECTOMY  1995   tah-bso   ARM WOUND REPAIR / CLOSURE  2013   cellulitis rt arm-i/d   KNEE BURSECTOMY Left 09/24/2013   Procedure: IRRIGATION AND DEBRIDEMENT OF LEFT KNEE ;  Surgeon: Sheral Apley, MD;  Location: [facility];  Service: Orthopedics;  Laterality: Left;   LAPAROTOMY N/A 12/30/2014   Procedure: EXPLORATORY LAPAROTOMY-graham patch of peptic ulcer;  Surgeon: Ricarda Frame, MD;  Location: [facility];  Service: General;  Laterality: N/A;    MEDICATIONS:  Prior to Admission medications   Medication Sig Start Date End Date Taking? Authorizing Provider  albuterol (VENTOLIN HFA) 108 (90 Base) MCG/ACT inhaler Inhale 2 puffs into the lungs every 6 (six) hours as needed for wheezing or shortness of breath. 02/27/21 03/29/21  Charise Killian, MD  albuterol (PROAIR HFA) 108 [phone] Base) MCG/ACT inhaler Inhale 2 puffs into the lungs every 6 (six) hours as needed for wheezing or shortness of breath. 02/27/21   Charise Killian, MD  aspirin EC (ASPIRIN 81) 81 MG tablet Take 1  tablet by mouth once a day for heart protection 06/30/21     atorvastatin (LIPITOR) 20 MG tablet 1 tablet by mouth once a day for cholesterol 06/30/21     azithromycin (ZITHROMAX) 250 MG tablet Take one tablet by mouth once daily for 2 days 02/27/21   Charise Killian, MD  buPROPion [facility] SR) 150 MG 12 hr tablet Take 1 tablet (150 mg total) by mouth once daily in the morning. 04/30/21     busPIRone (BUSPAR) 10 MG tablet TAKE 1 TABLET BY MOUTH ONCE EVERY MORNING AND TAKE 2 TABLETS BY MOUTH ONCE EVERY EVENING. 04/30/21     cephALEXin (KEFLEX) 500 MG capsule Take 1 capsule (500mg  total) by mouth twice daily for 7 days for UTI. 05/31/21     clonazePAM (KLONOPIN) 0.5 MG tablet Take 0.5 mg by mouth at bedtime as needed. 02/08/21   [provider]  DULoxetine (CYMBALTA) 30 MG capsule Take 2 capsules (60mg  total) by mouth 2 (two) times daily. 04/30/21     estradiol (ESTRACE) 1 MG tablet TAKE 1 AND 1/2 TABLETS (1.5MG  TOTAL) BY MOUTH ONCE DAILY 02/16/21 02/16/22  02/18/21, MD  fluconazole (DIFLUCAN) 150 MG tablet Take 1 tablet by mouth once daily as a single dose for yeast infection. May repeat in 3 days if symptoms persist 05/31/21     fluticasone-salmeterol (ADVAIR DISKUS)  250-50 MCG/ACT AEPB Inhale 1 puff into the lungs twice daily for asthma/COPD control. 03/03/21     gabapentin (NEURONTIN) 300 MG capsule Take 1 capsule (300mg  total) by mouth 3 times daily. 05/31/21     lisinopril (ZESTRIL) 20 MG tablet TAKE 1 TABLET BY MOUTH EVERY DAY FOR HIGH BLOOD PRESSURE 06/30/21     naproxen (NAPROSYN) 500 MG tablet Take 1 tablet by mouth twice a day for 2 weeks and then as needed for pain 06/30/21     ondansetron (ZOFRAN-ODT) 4 MG disintegrating tablet Dissolve 1-2 tablets by mouth once every 8 hours as needed for nausea. 11/25/20     pantoprazole (PROTONIX) 40 MG tablet TAKE ONE TABLET BY MOUTH EVERY DAY FOR REFLUX OR HEARTBURN. 09/16/20 09/16/21    predniSONE (DELTASONE) 10 MG tablet Take 5 tablets (50  mg total) by mouth once daily for 5 days. 03/03/21     QUEtiapine (SEROQUEL) 50 MG tablet TAKE 1 TABLET BY MOUTH ONCE EVERY NIGHT AT BEDTIME. 04/30/21     traMADol (ULTRAM) 50 MG tablet Take 50 mg by mouth 2 (two) times daily as needed. 12/26/20   [provider]    Physical Exam   Triage Vital Signs: ED Triage Vitals [11/04/21 0501]  Enc Vitals Group     BP (!) 151/102     Pulse Rate 94     Resp 18     Temp 97.7 F (36.5 C)     Temp Source Oral     SpO2 98 %     Weight 174 lb (78.9 kg)     Height 5\' 3"  (1.6 m)     Head Circumference      Peak Flow      Pain Score 7     Pain Loc      Pain Edu?      Excl. in GC?     Most recent vital signs: Vitals:   11/04/21 0501  BP: (!) 151/102  Pulse: 94  Resp: 18  Temp: 97.7 F (36.5 C)  SpO2: 98%    CONSTITUTIONAL: Alert and oriented and responds appropriately to questions. Well-appearing; well-nourished HEAD: Normocephalic, atraumatic EYES: Conjunctivae clear, pupils appear equal, sclera nonicteric ENT: normal nose; moist mucous membranes NECK: Supple, normal ROM CARD: RRR; S1 and S2 appreciated; no murmurs, no clicks, no rubs, no gallops RESP: Normal chest excursion without splinting or tachypnea; breath sounds clear and equal bilaterally; no wheezes, no rhonchi, no rales, no hypoxia or respiratory distress, speaking full sentences ABD/GI: Normal bowel sounds; non-distended; soft, non-tender, no rebound, no guarding, no peritoneal signs BACK: The back appears normal, no CVA tenderness EXT: Normal ROM in all joints; no deformity noted, no edema; no cyanosis SKIN: Normal color for age and race; warm; no rash on exposed skin NEURO: Moves all extremities equally, normal speech, ambulates with normal gait PSYCH: The patient's mood and manner are appropriate.   ED Results / Procedures / Treatments   LABS: (all labs ordered are listed, but only abnormal results are displayed) Labs Reviewed  CBC WITH  DIFFERENTIAL/PLATELET - Abnormal; Notable for the following components:      Result Value   WBC 11.2 (*)    Lymphs Abs 5.2 (*)    All other components within normal limits  COMPREHENSIVE METABOLIC PANEL - Abnormal; Notable for the following components:   Glucose, Bld 145 (*)    All other components within normal limits  URINALYSIS, ROUTINE W REFLEX MICROSCOPIC - Abnormal; Notable for the following components:  Color, Urine YELLOW (*)    APPearance HAZY (*)    Hgb urine dipstick LARGE (*)    Protein, ur 30 (*)    Leukocytes,Ua LARGE (*)    WBC, UA >50 (*)    All other components within normal limits  URINE CULTURE     EKG:  RADIOLOGY: My personal review and interpretation of imaging: CT scan shows no kidney stones but does show cystitis.  I have personally reviewed all radiology reports.   CT Renal Stone Study  Result Date: 11/04/2021 CLINICAL DATA:  Flank pain.  Kidney stone suspected. EXAM: CT ABDOMEN AND PELVIS WITHOUT CONTRAST TECHNIQUE: Multidetector CT imaging of the abdomen and pelvis was performed following the standard protocol without IV contrast. RADIATION DOSE REDUCTION: This exam was performed according to the departmental dose-optimization program which includes automated exposure control, adjustment of the mA and/or kV according to patient size and/or use of iterative reconstruction technique. COMPARISON:  CT without contrast 01/13/2020 FINDINGS: Lower chest: No acute abnormality. Hepatobiliary: 18 cm in length liver with mild steatosis. No focal lesion is seen without contrast. Gallbladder and bile ducts are unremarkable. Pancreas: Unremarkable without contrast. Spleen: Unremarkable without contrast.  Small splenules again noted. Adrenals/Urinary Tract: There is no adrenal mass. The unenhanced renal cortex is unremarkable. Symmetric perinephric fat stranding appears similar. There is no nephrolithiasis on either side. No pyelocaliectasis is seen but on the left, there is  mild asymmetric ureterectasis down to the bladder base without evidence of ureteral or intravesical stones. The bladder is empty but could be thickened. Stomach/Bowel: Small hiatal hernia. No dilatation or wall thickening. The appendix is normal, best visualized on the coronal reformatting. Submucosal fatty infiltration in the ascending colon is seen and may be due to prior colitis but no acute colitis is seen. There is mild fecal stasis. Scattered diverticulosis without diverticulitis. Vascular/Lymphatic: Aortic atherosclerosis. No enlarged abdominal or pelvic lymph nodes. Reproductive: Status post hysterectomy. No adnexal masses. Other: There is no incarcerated hernia. There is no free air, hemorrhage or fluid. Single phlebolith left pelvic sidewall is adjacent to but not within the distal left ureter Musculoskeletal: No acute or significant osseous findings. IMPRESSION: 1. No evidence of urinary stones including within the bladder. 2. Cystitis versus bladder nondistention. 3. Slight left ureterectasis without hydronephrosis nor ureteral stone, could be due to active diuresis, recently resolved obstruction or infectious process. 4. Steatosis of the liver. 5. Constipation with diverticulosis. 6. Small hiatal hernia. 7. Aortic atherosclerosis. Electronically Signed   By: Almira Bar M.D.   On: 11/04/2021 05:47     PROCEDURES:  Critical Care performed:      Procedures    IMPRESSION / MDM / ASSESSMENT AND PLAN / ED COURSE  I reviewed the triage vital signs and the nursing notes.    Patient here with dysuria and lower back pain.  The patient is on the cardiac monitor to evaluate for evidence of arrhythmia and/or significant heart rate changes.   DIFFERENTIAL DIAGNOSIS (includes but not limited to):   UTI, kidney stone, pyelonephritis, musculoskeletal pain, STI   Patient's presentation is most consistent with acute presentation with potential threat to life or bodily function.   PLAN: We  will obtain CBC, CMP, urinalysis, urine culture, CT renal study.  We will give ibuprofen for pain.   MEDICATIONS GIVEN IN ED: Medications  cephALEXin (KEFLEX) capsule 500 mg (has no administration in time range)  ibuprofen (ADVIL) tablet 800 mg (has no administration in time range)  ondansetron (ZOFRAN-ODT) disintegrating tablet  4 mg (has no administration in time range)     ED COURSE: Labs show leukocytosis of 11,000.  Normal electrolytes, renal function, LFTs.  Urine does appear grossly infected.  Culture is pending.  CT reviewed and interpreted by myself and the radiologist and shows no urinary stones.  She does have cystitis.  There is some slight ureterectasis without hydronephrosis or ureteral stone.  This is concerning for possible ascending infection.  She is otherwise well-appearing and hemodynamically stable.  She is comfortable with plan for discharge home.  Will discharge on Keflex, ibuprofen and Zofran.   CONSULTS: No admission needed at this time.  Patient has no sign of infected kidney stone and no signs of sepsis.  She is appropriate for outpatient management.   OUTSIDE RECORDS REVIEWED: Reviewed patient's last pulmonology note on 04/02/2021.       FINAL CLINICAL IMPRESSION(S) / ED DIAGNOSES   Final diagnoses:  Acute UTI     Rx / DC Orders   ED Discharge Orders          Ordered    phenazopyridine (PYRIDIUM) 95 MG tablet  3 times daily PRN        11/04/21 0619    cephALEXin (KEFLEX) 500 MG capsule  3 times daily        11/04/21 0619    ondansetron (ZOFRAN-ODT) 4 MG disintegrating tablet  Every 6 hours PRN        11/04/21 0619    ibuprofen (ADVIL) 800 MG tablet  Every 8 hours PRN        11/04/21 0636             Note:  This document was prepared using Dragon voice recognition software and may include unintentional dictation errors.   Davelle Anselmi, Delice Bison, DO 11/04/21 [phone]

## 2021-11-04 NOTE — ED Triage Notes (Signed)
Patient ambulatory to triage with steady gait, without difficulty or distress noted; pt reports for several days having dysuria, hematuria, back pain

## 2021-11-04 NOTE — Discharge Instructions (Addendum)
You may alternate Tylenol 1000 mg every 6 hours as needed for pain, fever and Ibuprofen 800 mg every 6-8 hours as needed for pain, fever.  Please take Ibuprofen with food.  Do not take more than 4000 mg of Tylenol (acetaminophen) in a 24 hour period. ° °

## 2021-11-06 LAB — URINE CULTURE: Culture: >=100000 — AB

## 2021-11-07 ENCOUNTER — Other Ambulatory Visit: Payer: Self-pay

## 2021-11-17 ENCOUNTER — Other Ambulatory Visit: Payer: Self-pay

## 2021-11-18 ENCOUNTER — Other Ambulatory Visit: Payer: Self-pay

## 2021-11-18 MED ORDER — HYDROXYZINE HCL 25 MG PO TABS
ORAL_TABLET | ORAL | 1 refills | Status: DC
  Filled 2021-11-18: qty 90, 30d supply, fill #0
  Filled 2021-12-16: qty 90, 30d supply, fill #1

## 2021-11-18 MED ORDER — BUSPIRONE HCL 10 MG PO TABS
20.0000 mg | ORAL_TABLET | Freq: Two times a day (BID) | ORAL | 1 refills | Status: DC
  Filled 2021-11-18: qty 120, 30d supply, fill #0
  Filled 2021-12-16: qty 120, 30d supply, fill #1

## 2021-11-18 MED ORDER — DULOXETINE HCL 30 MG PO CPEP
ORAL_CAPSULE | ORAL | 1 refills | Status: DC
  Filled 2021-11-18: qty 90, 30d supply, fill #0
  Filled 2021-12-16: qty 90, 30d supply, fill #1

## 2021-11-22 ENCOUNTER — Other Ambulatory Visit: Payer: Self-pay

## 2021-12-16 ENCOUNTER — Other Ambulatory Visit: Payer: Self-pay

## 2021-12-23 ENCOUNTER — Other Ambulatory Visit: Payer: Self-pay

## 2022-01-12 ENCOUNTER — Other Ambulatory Visit: Payer: Self-pay

## 2022-01-12 MED ORDER — HYDROXYZINE HCL 25 MG PO TABS
ORAL_TABLET | ORAL | 0 refills | Status: DC
  Filled 2022-01-12: qty 42, 14d supply, fill #0

## 2022-01-12 MED ORDER — BUSPIRONE HCL 10 MG PO TABS
20.0000 mg | ORAL_TABLET | Freq: Two times a day (BID) | ORAL | 0 refills | Status: DC
  Filled 2022-01-12: qty 28, 7d supply, fill #0

## 2022-01-12 MED ORDER — DULOXETINE HCL 30 MG PO CPEP
ORAL_CAPSULE | ORAL | 0 refills | Status: DC
  Filled 2022-01-12: qty 42, 14d supply, fill #0

## 2022-01-19 ENCOUNTER — Other Ambulatory Visit: Payer: Self-pay

## 2022-01-25 ENCOUNTER — Other Ambulatory Visit: Payer: Self-pay

## 2022-01-27 ENCOUNTER — Other Ambulatory Visit: Payer: Self-pay

## 2022-01-27 MED ORDER — BUSPIRONE HCL 10 MG PO TABS
20.0000 mg | ORAL_TABLET | Freq: Two times a day (BID) | ORAL | 0 refills | Status: DC
  Filled 2022-01-27 (×2): qty 56, 14d supply, fill #0

## 2022-01-27 MED ORDER — DULOXETINE HCL 30 MG PO CPEP
ORAL_CAPSULE | ORAL | 0 refills | Status: DC
  Filled 2022-01-27: qty 42, 14d supply, fill #0

## 2022-01-27 MED ORDER — HYDROXYZINE HCL 25 MG PO TABS: 25.0000 mg | ORAL_TABLET | Freq: Three times a day (TID) | ORAL | 0 refills | Status: DC | PRN

## 2022-02-08 ENCOUNTER — Other Ambulatory Visit: Payer: Self-pay

## 2022-02-08 ENCOUNTER — Telehealth: Payer: Self-pay

## 2022-02-08 DIAGNOSIS — L900 Lichen sclerosus et atrophicus: Secondary | ICD-10-CM

## 2022-02-08 MED ORDER — CLOBETASOL PROPIONATE 0.05 % EX OINT
1.0000 | TOPICAL_OINTMENT | CUTANEOUS | 1 refills | Status: DC
  Filled 2022-02-08: qty 60, 30d supply, fill #0

## 2022-02-08 NOTE — Telephone Encounter (Signed)
Pt calling for a refill of her cream for lichens.  [phone]

## 2022-02-08 NOTE — Telephone Encounter (Signed)
Rx sent in

## 2022-02-09 ENCOUNTER — Other Ambulatory Visit: Payer: Self-pay

## 2022-02-09 MED ORDER — DULOXETINE HCL 30 MG PO CPEP
ORAL_CAPSULE | ORAL | 1 refills | Status: DC
  Filled 2022-02-09: qty 90, 30d supply, fill #0
  Filled 2022-03-07: qty 90, 30d supply, fill #1

## 2022-02-09 MED ORDER — HYDROXYZINE HCL 25 MG PO TABS
25.0000 mg | ORAL_TABLET | Freq: Three times a day (TID) | ORAL | 1 refills | Status: DC | PRN
  Filled 2022-02-09: qty 90, 30d supply, fill #0
  Filled 2022-04-24: qty 90, 30d supply, fill #1

## 2022-02-09 MED ORDER — BUSPIRONE HCL 10 MG PO TABS
20.0000 mg | ORAL_TABLET | Freq: Two times a day (BID) | ORAL | 1 refills | Status: DC
  Filled 2022-02-09: qty 34, 8d supply, fill #0
  Filled 2022-02-09: qty 86, 22d supply, fill #0
  Filled 2022-03-07: qty 120, 30d supply, fill #1

## 2022-02-22 ENCOUNTER — Other Ambulatory Visit: Payer: Self-pay | Admitting: Obstetrics and Gynecology

## 2022-02-22 ENCOUNTER — Other Ambulatory Visit: Payer: Self-pay

## 2022-02-22 DIAGNOSIS — Z01419 Encounter for gynecological examination (general) (routine) without abnormal findings: Secondary | ICD-10-CM

## 2022-02-22 MED ORDER — ESTRADIOL 1 MG PO TABS
1.5000 mg | ORAL_TABLET | Freq: Every day | ORAL | 0 refills | Status: DC
  Filled 2022-02-22: qty 135, 90d supply, fill #0

## 2022-03-02 ENCOUNTER — Other Ambulatory Visit: Payer: Self-pay

## 2022-03-07 ENCOUNTER — Other Ambulatory Visit: Payer: Self-pay

## 2022-04-05 ENCOUNTER — Other Ambulatory Visit: Payer: Self-pay

## 2022-04-07 ENCOUNTER — Other Ambulatory Visit: Payer: Self-pay

## 2022-04-10 ENCOUNTER — Other Ambulatory Visit: Payer: Self-pay

## 2022-04-15 ENCOUNTER — Other Ambulatory Visit: Payer: Self-pay

## 2022-04-15 MED ORDER — HYDROXYZINE HCL 25 MG PO TABS
25.0000 mg | ORAL_TABLET | Freq: Three times a day (TID) | ORAL | 0 refills | Status: DC | PRN
  Filled 2022-04-15: qty 21, 7d supply, fill #0

## 2022-04-15 MED ORDER — BUSPIRONE HCL 10 MG PO TABS
20.0000 mg | ORAL_TABLET | Freq: Two times a day (BID) | ORAL | 0 refills | Status: DC
  Filled 2022-04-15: qty 28, 7d supply, fill #0

## 2022-04-15 MED ORDER — DULOXETINE HCL 30 MG PO CPEP
ORAL_CAPSULE | ORAL | 0 refills | Status: DC
  Filled 2022-04-15: qty 21, 7d supply, fill #0

## 2022-04-20 ENCOUNTER — Ambulatory Visit (INDEPENDENT_AMBULATORY_CARE_PROVIDER_SITE_OTHER): Payer: Medicaid Other | Admitting: Obstetrics and Gynecology

## 2022-04-20 ENCOUNTER — Other Ambulatory Visit: Payer: Self-pay

## 2022-04-20 ENCOUNTER — Encounter: Payer: Self-pay | Admitting: Obstetrics and Gynecology

## 2022-04-20 ENCOUNTER — Other Ambulatory Visit (HOSPITAL_COMMUNITY)
Admission: RE | Admit: 2022-04-20 | Discharge: 2022-04-20 | Disposition: A | Discharge status: Home / Self Care | Payer: Medicaid Other | Source: Ambulatory Visit | Attending: Obstetrics and Gynecology | Admitting: Obstetrics and Gynecology

## 2022-04-20 VITALS — BP 132/80 | HR 103 | Ht 63.0 in | Wt 172.1 lb

## 2022-04-20 DIAGNOSIS — Z01419 Encounter for gynecological examination (general) (routine) without abnormal findings: Secondary | ICD-10-CM | POA: Diagnosis present

## 2022-04-20 DIAGNOSIS — Z79890 Hormone replacement therapy (postmenopausal): Secondary | ICD-10-CM

## 2022-04-20 DIAGNOSIS — Z1231 Encounter for screening mammogram for malignant neoplasm of breast: Secondary | ICD-10-CM

## 2022-04-20 DIAGNOSIS — R300 Dysuria: Secondary | ICD-10-CM | POA: Diagnosis not present

## 2022-04-20 DIAGNOSIS — L900 Lichen sclerosus et atrophicus: Secondary | ICD-10-CM

## 2022-04-20 DIAGNOSIS — N898 Other specified noninflammatory disorders of vagina: Secondary | ICD-10-CM

## 2022-04-20 DIAGNOSIS — Z01411 Encounter for gynecological examination (general) (routine) with abnormal findings: Secondary | ICD-10-CM | POA: Diagnosis not present

## 2022-04-20 LAB — POCT URINALYSIS DIPSTICK
Bilirubin, UA: NEGATIVE
Glucose, UA: NEGATIVE
Ketones, UA: NEGATIVE
Nitrite, UA: NEGATIVE
Protein, UA: NEGATIVE
Spec Grav, UA: 1.025 (ref 1.010–1.025)
Urobilinogen, UA: 0.2 E.U./dL
pH, UA: 6 (ref 5.0–8.0)

## 2022-04-20 MED ORDER — CLOBETASOL PROPIONATE 0.05 % EX OINT
1.0000 | TOPICAL_OINTMENT | CUTANEOUS | 1 refills | Status: DC
  Filled 2022-04-20: qty 60, 30d supply, fill #0

## 2022-04-20 MED ORDER — ESTRADIOL 1 MG PO TABS
1.5000 mg | ORAL_TABLET | Freq: Every day | ORAL | 3 refills | Status: DC
  Filled 2022-04-20 – 2022-05-11 (×2): qty 135, 90d supply, fill #0
  Filled 2022-08-21: qty 135, 90d supply, fill #1

## 2022-04-20 NOTE — Progress Notes (Signed)
HPI:      [name] is a 57 y.o. G1P1001 who LMP was No LMP recorded. Patient has had a hysterectomy.  Subjective:   She presents today for her annual examination.  She states that she has been under a lot of stress lately because her husband has been ill.  She reports that the increase in estrogen that we did last year worked for a while but she seems to have more hot flashes now, but "it could be my anxiety".  She also complains of a vaginal discharge after antibiotic use.  She says this has been present for about a month and is accompanied by significant vaginal itching.  She has been using the clobetasol as directed twice a week but she says it does not seem to be helping with his current itching.  She is requesting all vaginal cultures be performed including STDs. Of significant note, patient has had a hysterectomy for endometriosis.    Hx: The following portions of the patient's history were reviewed and updated as appropriate:             She  has a past medical history of Anxiety, Bipolar disorder, CHF (congestive heart failure), Depression, Disc disorder, Dyspareunia, Endometriosis, GERD (gastroesophageal reflux disease), Insomnia, Lichen, Surgical menopause, Vaginal atrophy, and Wears glasses. She does not have any pertinent problems on file. She  has a past surgical history that includes Abdominal hysterectomy (1995); Arm wound repair / closure (2013); Knee bursectomy (Left, 09/24/2013); and laparotomy (N/A, 12/30/2014). Her family history includes Cancer in her father; Diabetes in her mother and sister. She  reports that she has been smoking cigarettes. She has been smoking an average of 1 pack per day. She quit smokeless tobacco use about 2 years ago. She reports that she does not drink alcohol and does not use drugs. She has a current medication list which includes the following prescription(s): buspirone, duloxetine, duloxetine, hydroxyzine, hydroxyzine, hydroxyzine,  hydroxyzine, [START ON 04/21/2022] clobetasol ointment, and estradiol. She is allergic to hydrocodone, aspirin, and morphine and related.       Review of Systems:  Review of Systems  Constitutional: Denied constitutional symptoms, night sweats, recent illness, fatigue, fever, insomnia and weight loss.  Eyes: Denied eye symptoms, eye pain, photophobia, vision change and visual disturbance.  Ears/Nose/Throat/Neck: Denied ear, nose, throat or neck symptoms, hearing loss, nasal discharge, sinus congestion and sore throat.  Cardiovascular: Denied cardiovascular symptoms, arrhythmia, chest pain/pressure, edema, exercise intolerance, orthopnea and palpitations.  Respiratory: Denied pulmonary symptoms, asthma, pleuritic pain, productive sputum, cough, dyspnea and wheezing.  Gastrointestinal: Denied, gastro-esophageal reflux, melena, nausea and vomiting.  Genitourinary: See HPI for additional information.  Musculoskeletal: Denied musculoskeletal symptoms, stiffness, swelling, muscle weakness and myalgia.  Dermatologic: Denied dermatology symptoms, rash and scar.  Neurologic: Denied neurology symptoms, dizziness, headache, neck pain and syncope.  Psychiatric: Denied psychiatric symptoms, anxiety and depression.  Endocrine: Denied endocrine symptoms including hot flashes and night sweats.   Meds:   Current Outpatient Medications on File Prior to Visit  Medication Sig Dispense Refill   busPIRone (BUSPAR) 10 MG tablet Take 2 tablets (20 mg total) by mouth 2 (two) times daily. 28 tablet 0   DULoxetine (CYMBALTA) 30 MG capsule Take 2 capsules (60mg  total) by mouth 2 (two) times daily. 120 capsule 2   DULoxetine (CYMBALTA) 30 MG capsule Take 1 capsule (30 mg total) by mouth in the morning AND 2 capsules (60 mg total) at bedtime. 21 capsule 0   hydrOXYzine (ATARAX) 25  MG tablet TAKE 1 TABLET BY MOUTH THREE TIMES DAILY 42 tablet 0   hydrOXYzine (ATARAX) 25 MG tablet Take 1 tablet (25 mg total) by mouth 3  (three) times daily as needed. 42 tablet 0   hydrOXYzine (ATARAX) 25 MG tablet Take 1 tablet (25 mg total) by mouth 3 (three) times daily as needed. 90 tablet 1   hydrOXYzine (ATARAX) 25 MG tablet Take 1 tablet (25 mg total) by mouth 3 (three) times daily as needed. 21 tablet 0   No current facility-administered medications on file prior to visit.     Objective:     Vitals:   04/20/22 0820  BP: 132/80  Pulse: (!) 103    Filed Weights   04/20/22 0820  Weight: 172 lb 1.6 oz (78.1 kg)              Physical examination General NAD, Conversant  HEENT Atraumatic; Op clear with mmm.  Normo-cephalic. Pupils reactive. Anicteric sclerae  Thyroid/Neck Smooth without nodularity or enlargement. Normal ROM.  Neck Supple.  Skin No rashes, lesions or ulceration. Normal palpated skin turgor. No nodularity.  Breasts: No masses or discharge.  Symmetric.  No axillary adenopathy.  Lungs: Clear to auscultation.No rales or wheezes. Normal Respiratory effort, no retractions.  Heart: NSR.  No murmurs or rubs appreciated. No peripheral edema  Abdomen: Soft.  Non-tender.  No masses.  No HSM. No hernia  Extremities: Moves all appropriately.  Normal ROM for age. No lymphadenopathy.  Neuro: Oriented to PPT.  Normal mood. Normal affect.     Pelvic:   Vulva: Normal appearance.  No lesions.  Erythema noted, excoriation present  Vagina: No lesions or abnormalities noted.  Appears atrophic and irritated  Support: Normal pelvic support.  Urethra No masses tenderness or scarring.  Meatus Normal size without lesions or prolapse.  Cervix: Surgically absent  Anus: Normal exam.  No lesions.  Perineum: Normal exam.  No lesions.        Bimanual   Uterus: Surgically absent  Adnexae: No masses.  Non-tender to palpation.  Cul-de-sac: Negative for abnormality.     Assessment:    G1P1001 Patient Active Problem List   Diagnosis Date Noted   Sepsis 02/25/2021   Encounter for medication management 10/08/2018    SUI (stress urinary incontinence, female) 05/19/2017   Generalized anxiety disorder 03/21/2017   CAP (community acquired pneumonia) 03/18/2017   Acute respiratory failure    Perforated viscus [id number]   Lichen sclerosus [id number]   Surgical menopause 09/24/2014   Endometriosis 09/24/2014   Bipolar 1 disorder 09/24/2014   Insomnia 09/24/2014   GERD (gastroesophageal reflux disease) 09/24/2014   History of stomach ulcers 09/24/2014     1. Well woman exam with routine gynecological exam   2. Hormone replacement therapy (HRT)   3. Screening mammogram for breast cancer   4. Vaginal discharge   5. Burning with urination   6. Vagina itching   7. Lichen sclerosus     Suspect chronic yeast infection -doubt lichen sclerosus as the cause of her current vaginal discharge and itching  Likely her estrogen dose is enough and her increasing "hot flashes" has to do with anxiety.  UA not consistent with UTI   Plan:            1.  Basic Screening Recommendations The basic screening recommendations for asymptomatic women were discussed with the patient during her visit.  The age-appropriate recommendations were discussed with her and the rational for the tests reviewed.  When  I am informed by the patient that another primary care physician has previously obtained the age-appropriate tests and they are up-to-date, only outstanding tests are ordered and referrals given as necessary.  Abnormal results of tests will be discussed with her when all of her results are completed.  Routine preventative health maintenance measures emphasized: Exercise/Diet/Weight control, Tobacco Warnings, Alcohol/Substance use risks and Stress Management 2.  Nuswab performed  3.  Urine sent for culture 4.  Continue estrogen 5.  Continue clobetasol We will contact her with any abnormal results and any antibiotics or antifungals that she requires.  Orders Orders Placed This Encounter  Procedures   Urine Culture   MM  DIGITAL SCREENING BILATERAL   Basic metabolic panel   CBC   TSH   Lipid panel   Hemoglobin A1c   POCT urinalysis dipstick     Meds ordered this encounter  Medications   estradiol (ESTRACE) 1 MG tablet    Sig: Take 1.5 tablets (1.5 mg total) by mouth daily.    Dispense:  135 tablet    Refill:  3   clobetasol ointment (TEMOVATE) 0.05 %    Sig: Apply 1 Application topically 2 (two) times a week.    Dispense:  60 g    Refill:  1       F/U  Return in about 1 year (around 04/20/2023) for Annual Physical.  Finis Bud, M.D. 04/20/2022 8:56 AM

## 2022-04-20 NOTE — Progress Notes (Signed)
Patients presents for annual exam today. She states continuing to use HRT, still experiencing hot flashes.Complaints of vaginal itching, burning and discharge. Patient is due for mammogram, ordered. Annual labs are ordered. She states no other questions or concerns at this time.

## 2022-04-21 LAB — BASIC METABOLIC PANEL
BUN/Creatinine Ratio: 14 (ref 9–23)
BUN: 11 mg/dL (ref 6–24)
CO2: 23 mmol/L (ref 20–29)
Calcium: 9.3 mg/dL (ref 8.7–10.2)
Chloride: 97 mmol/L (ref 96–106)
Creatinine, Ser: 0.8 mg/dL (ref 0.57–1.00)
Glucose: 215 mg/dL — ABNORMAL HIGH (ref 70–99)
Potassium: 4.8 mmol/L (ref 3.5–5.2)
Sodium: 135 mmol/L (ref 134–144)
eGFR: 88 mL/min/{1.73_m2} (ref >59)

## 2022-04-21 LAB — CBC
Hematocrit: 40.3 % (ref 34.0–46.6)
Hemoglobin: 13.3 g/dL (ref 11.1–15.9)
MCH: 28.6 pg (ref 26.6–33.0)
MCHC: 33 g/dL (ref 31.5–35.7)
MCV: 87 fL (ref 79–97)
Platelets: 315 10*3/uL (ref 150–450)
RBC: 4.65 x10E6/uL (ref 3.77–5.28)
RDW: 12.8 % (ref 11.7–15.4)
WBC: 9.1 10*3/uL (ref 3.4–10.8)

## 2022-04-21 LAB — CERVICOVAGINAL ANCILLARY ONLY
Bacterial Vaginitis (gardnerella): NEGATIVE
Candida Glabrata: NEGATIVE
Candida Vaginitis: POSITIVE — AB
Chlamydia: NEGATIVE
Comment: NEGATIVE
Comment: NEGATIVE
Comment: NEGATIVE
Comment: NEGATIVE
Comment: NEGATIVE
Comment: NORMAL
Neisseria Gonorrhea: NEGATIVE
Trichomonas: POSITIVE — AB

## 2022-04-21 LAB — LIPID PANEL
Chol/HDL Ratio: 4.6 ratio — ABNORMAL HIGH (ref 0.0–4.4)
Cholesterol, Total: 234 mg/dL — ABNORMAL HIGH (ref 100–199)
HDL: 51 mg/dL (ref >39)
LDL Chol Calc (NIH): 145 mg/dL — ABNORMAL HIGH (ref 0–99)
Triglycerides: 209 mg/dL — ABNORMAL HIGH (ref 0–149)
VLDL Cholesterol Cal: 38 mg/dL (ref 5–40)

## 2022-04-21 LAB — HEMOGLOBIN A1C
Est. average glucose Bld gHb Est-mCnc: 180 mg/dL
Hgb A1c MFr Bld: 7.9 % — ABNORMAL HIGH (ref 4.8–5.6)

## 2022-04-21 LAB — TSH: TSH: 2.44 u[IU]/mL (ref 0.450–4.500)

## 2022-04-22 ENCOUNTER — Other Ambulatory Visit: Payer: Self-pay

## 2022-04-22 DIAGNOSIS — B379 Candidiasis, unspecified: Secondary | ICD-10-CM

## 2022-04-22 DIAGNOSIS — A599 Trichomoniasis, unspecified: Secondary | ICD-10-CM

## 2022-04-22 LAB — URINE CULTURE

## 2022-04-22 MED ORDER — METRONIDAZOLE 500 MG PO TABS
ORAL_TABLET | ORAL | 1 refills | Status: DC
  Filled 2022-04-22: qty 4, 1d supply, fill #0
  Filled 2022-04-24: qty 4, 1d supply, fill #1

## 2022-04-22 MED ORDER — FLUCONAZOLE 150 MG PO TABS
150.0000 mg | ORAL_TABLET | Freq: Every day | ORAL | 0 refills | Status: DC
  Filled 2022-04-22 (×2): qty 1, 1d supply, fill #0

## 2022-04-24 ENCOUNTER — Other Ambulatory Visit: Payer: Self-pay

## 2022-04-25 ENCOUNTER — Other Ambulatory Visit: Payer: Self-pay

## 2022-04-25 DIAGNOSIS — N390 Urinary tract infection, site not specified: Secondary | ICD-10-CM

## 2022-04-25 DIAGNOSIS — B379 Candidiasis, unspecified: Secondary | ICD-10-CM

## 2022-04-25 DIAGNOSIS — B955 Unspecified streptococcus as the cause of diseases classified elsewhere: Secondary | ICD-10-CM

## 2022-04-25 MED ORDER — FLUCONAZOLE 150 MG PO TABS
150.0000 mg | ORAL_TABLET | Freq: Once | ORAL | 0 refills | Status: AC
  Filled 2022-04-25: qty 1, 1d supply, fill #0

## 2022-04-25 MED ORDER — AMPICILLIN 500 MG PO CAPS
500.0000 mg | ORAL_CAPSULE | Freq: Four times a day (QID) | ORAL | 0 refills | Status: DC
  Filled 2022-04-25: qty 28, 7d supply, fill #0

## 2022-04-29 ENCOUNTER — Other Ambulatory Visit: Payer: Self-pay

## 2022-05-11 ENCOUNTER — Other Ambulatory Visit: Payer: Self-pay

## 2022-05-12 ENCOUNTER — Other Ambulatory Visit: Payer: Self-pay

## 2022-06-10 ENCOUNTER — Other Ambulatory Visit: Payer: Self-pay

## 2022-10-03 ENCOUNTER — Other Ambulatory Visit: Payer: Self-pay

## 2022-10-30 ENCOUNTER — Other Ambulatory Visit: Payer: Self-pay

## 2022-10-30 ENCOUNTER — Emergency Department
Admission: EM | Admit: 2022-10-30 | Discharge: 2022-10-30 | Disposition: A | Discharge status: Home / Self Care | Payer: Medicaid Other | Attending: Emergency Medicine | Admitting: Emergency Medicine

## 2022-10-30 ENCOUNTER — Emergency Department: Payer: Medicaid Other

## 2022-10-30 DIAGNOSIS — D72829 Elevated white blood cell count, unspecified: Secondary | ICD-10-CM | POA: Diagnosis not present

## 2022-10-30 DIAGNOSIS — I509 Heart failure, unspecified: Secondary | ICD-10-CM | POA: Insufficient documentation

## 2022-10-30 DIAGNOSIS — R1013 Epigastric pain: Secondary | ICD-10-CM | POA: Insufficient documentation

## 2022-10-30 DIAGNOSIS — R112 Nausea with vomiting, unspecified: Secondary | ICD-10-CM | POA: Diagnosis not present

## 2022-10-30 DIAGNOSIS — K838 Other specified diseases of biliary tract: Secondary | ICD-10-CM

## 2022-10-30 DIAGNOSIS — R079 Chest pain, unspecified: Secondary | ICD-10-CM

## 2022-10-30 DIAGNOSIS — R0789 Other chest pain: Secondary | ICD-10-CM | POA: Diagnosis not present

## 2022-10-30 LAB — BASIC METABOLIC PANEL
Anion gap: 11 (ref 5–15)
BUN: 12 mg/dL (ref 6–20)
CO2: 26 mmol/L (ref 22–32)
Calcium: 9.5 mg/dL (ref 8.9–10.3)
Chloride: 99 mmol/L (ref 98–111)
Creatinine, Ser: 0.7 mg/dL (ref 0.44–1.00)
GFR, Estimated: >60 mL/min (ref >60)
Glucose, Bld: 188 mg/dL — ABNORMAL HIGH (ref 70–99)
Potassium: 3.7 mmol/L (ref 3.5–5.1)
Sodium: 136 mmol/L (ref 135–145)

## 2022-10-30 LAB — HEPATIC FUNCTION PANEL
ALT: 32 U/L (ref 0–44)
AST: 123 U/L — ABNORMAL HIGH (ref 15–41)
Albumin: 4 g/dL (ref 3.5–5.0)
Alkaline Phosphatase: 178 U/L — ABNORMAL HIGH (ref 38–126)
Bilirubin, Direct: 0.6 mg/dL — ABNORMAL HIGH (ref 0.0–0.2)
Indirect Bilirubin: 0.8 mg/dL (ref 0.3–0.9)
Total Bilirubin: 1.4 mg/dL — ABNORMAL HIGH (ref 0.3–1.2)
Total Protein: 7.5 g/dL (ref 6.5–8.1)

## 2022-10-30 LAB — CBC
HCT: 41 % (ref 36.0–46.0)
Hemoglobin: 14 g/dL (ref 12.0–15.0)
MCH: 30.1 pg (ref 26.0–34.0)
MCHC: 34.1 g/dL (ref 30.0–36.0)
MCV: 88.2 fL (ref 80.0–100.0)
Platelets: 248 10*3/uL (ref 150–400)
RBC: 4.65 MIL/uL (ref 3.87–5.11)
RDW: 12.2 % (ref 11.5–15.5)
WBC: 12.7 10*3/uL — ABNORMAL HIGH (ref 4.0–10.5)
nRBC: 0 % (ref 0.0–0.2)

## 2022-10-30 LAB — TROPONIN I (HIGH SENSITIVITY)
Troponin I (High Sensitivity): 5 ng/L (ref <18)
Troponin I (High Sensitivity): 4 ng/L (ref <18)

## 2022-10-30 LAB — LIPASE, BLOOD: Lipase: 38 U/L (ref 11–51)

## 2022-10-30 MED ORDER — OXYCODONE HCL 5 MG PO TABS
5.0000 mg | ORAL_TABLET | ORAL | 0 refills | Status: DC | PRN
Start: 2022-10-30 — End: 2022-11-03

## 2022-10-30 MED ORDER — MORPHINE SULFATE (PF) 4 MG/ML IV SOLN
4.0000 mg | Freq: Once | INTRAVENOUS | Status: AC
  Administered 2022-10-30: 4 mg via INTRAVENOUS
  Filled 2022-10-30: qty 1

## 2022-10-30 MED ORDER — SODIUM CHLORIDE 0.9 % IV BOLUS
1000.0000 mL | Freq: Once | INTRAVENOUS | Status: AC
  Administered 2022-10-30: 1000 mL via INTRAVENOUS

## 2022-10-30 MED ORDER — GADOBUTROL 1 MMOL/ML IV SOLN
7.0000 mL | Freq: Once | INTRAVENOUS | Status: AC | PRN
  Administered 2022-10-30: 7 mL via INTRAVENOUS

## 2022-10-30 MED ORDER — FAMOTIDINE IN NACL 20-0.9 MG/50ML-% IV SOLN
20.0000 mg | Freq: Once | INTRAVENOUS | Status: AC
  Administered 2022-10-30: 20 mg via INTRAVENOUS
  Filled 2022-10-30: qty 50

## 2022-10-30 MED ORDER — HYDROMORPHONE HCL 1 MG/ML IJ SOLN: 0.5000 mg | Freq: Once | INTRAMUSCULAR | Status: DC

## 2022-10-30 MED ORDER — ONDANSETRON HCL 4 MG/2ML IJ SOLN
4.0000 mg | Freq: Once | INTRAMUSCULAR | Status: AC
  Administered 2022-10-30: 4 mg via INTRAVENOUS
  Filled 2022-10-30: qty 2

## 2022-10-30 NOTE — Discharge Instructions (Signed)
You were seen for your chest and abdominal pain.  Some of your liver functions are slightly elevated today along with imaging showing concern that you may have potentially passed a gallstone.  No further infection noted at this time.  The GI team would like for you to have follow-up with them for an outpatient EUS or endoscopic ultrasound.  Please reach out and set up an appointment with them as soon as possible.  We would also like you to follow-up with general surgery team to discuss whether you need removal of your gallbladder electively outpatient.  Please call them and set up an appointment for this.  You can also follow-up with your primary care provider for recheck of your liver function labs a week from now to ensure improvement.  Please return for any worsening symptoms.

## 2022-10-30 NOTE — ED Triage Notes (Signed)
BIB with c/o mid chest pain and epigastric pain, n/v that started around midnight. No c/o sob, fever

## 2022-10-30 NOTE — ED Provider Notes (Signed)
  Physical Exam  Pulse 88   Temp (!) 97.3 F (36.3 C) (Oral)   Resp 16   Ht 5\' 3"  (1.6 m)   Wt 72.6 kg   SpO2 99%   BMI 28.34 kg/m   Physical Exam I have reviewed the vital signs. General:  Awake, alert, no acute distress. Head:  Normocephalic, Atraumatic. EENT:  PERRL, EOMI, Oral mucosa pink and moist, Neck is supple. Cardiovascular: Regular rate, 2+ distal pulses. Respiratory:  Normal respiratory effort, symmetrical expansion, no distress.   Extremities:  Moving all four extremities through full ROM without pain.   Neuro:  Alert and oriented.  Interacting appropriately.   Skin:  Warm, dry, no rash.   Psych: Appropriate affect.   Procedures  Procedures  ED Course / MDM   Clinical Course as of 10/30/22 1610  Brandy Robinson Oct 30, 2022  9604 Updated patient and family member on ultrasound concerning for dilated CBD.  Will repeat troponin and proceed to MRCP. [JS]  [id number] Patient currently in MRCP.  Repeat troponin remains normal.  Updated patient's family member.  Care will be transferred to the oncoming provider at change of shift pending results of MRCP and disposition. [JS]  [id number] Spoke with GI, Dr. Tobi Bastos, who recommends no further intervention at this time.  States that patient should have an EUS done outpatient as well as follow-up with general surgery to discuss potential elective cholecystectomy.  No further needs here in the hospital and safe for discharge. [DW]    Clinical Course User Index [DW] Brandy Lima, MD [JS] Irean Hong, MD   Medical Decision Making Amount and/or Complexity of Data Reviewed Labs: ordered. Radiology: ordered.  Risk Prescription drug management.   Received patient in signout.  57 year old female presenting today for acute onset chest and epigastric pain associated with nausea and vomiting.  Cardiac workup was reassuring with normal-appearing EKG and troponins negative x 2.  Patient found to have elevated AST and T. bili.  Right upper quadrant  ultrasound had showed common bile duct dilation but no obvious evidence of stones or cholecystitis.  Follow-up MRCP was ordered at time of signout.  MRCP resulted showing no evidence of specific blockage.  There is a distended gallbladder without gallstones or gall sludge present.  Discussed case with Dr. Tobi Bastos with GI who recommends no further intervention at this time.  Patient is safe for outpatient EUS and will also need general surgery follow-up to discuss potential elective cholecystectomy.  Patient's pain improved on reassessment and feeling comfortable at this time.  Will also have her follow-up with her PCP for recheck of liver function labs within the next week to ensure improvement.  Possibility that patient had passed a stone which is no longer present at this time.  Patient given strict return precautions and agreeable with plan.    Brandy Lima, MD 10/30/22 [phone]

## 2022-10-30 NOTE — ED Provider Notes (Signed)
[facility] Provider Note    Event Date/Time   First MD Initiated Contact with Patient 10/30/22 [phone]     (approximate)   History   Chest Pain (BIB with c/o mid chest pain and epigastric pain, n/v that started around midnight. No c/o sob, fever)   HPI  Brandy Robinson is a 57 y.o. female brought to the ED via EMS from home with a chief complaint of chest/epigastric pain associated with nausea and vomiting which began around midnight.  Patient was given IV Zofran and aspirin by EMS.  Denies associated fever/chills, diaphoresis, shortness of breath, dysuria or diarrhea.     Past Medical History   Past Medical History:  Diagnosis Date   Anxiety    Bipolar disorder (HCC)    CHF (congestive heart failure) (HCC)    Depression    Disc disorder    Dyspareunia    Endometriosis    GERD (gastroesophageal reflux disease)    Insomnia    Lichen    Surgical menopause    Vaginal atrophy    Wears glasses      Active Problem List   Patient Active Problem List   Diagnosis Date Noted   Sepsis (HCC) 02/25/2021   Encounter for medication management 10/08/2018   SUI (stress urinary incontinence, female) 05/19/2017   Generalized anxiety disorder 03/21/2017   CAP (community acquired pneumonia) 03/18/2017   Acute respiratory failure (HCC)    Perforated viscus 12/30/2014   Lichen sclerosus 09/24/2014   Surgical menopause 09/24/2014   Endometriosis 09/24/2014   Bipolar 1 disorder (HCC) 09/24/2014   Insomnia 09/24/2014   GERD (gastroesophageal reflux disease) 09/24/2014   History of stomach ulcers 09/24/2014     Past Surgical History   Past Surgical History:  Procedure Laterality Date   ABDOMINAL HYSTERECTOMY  1995   tah-bso   ARM WOUND REPAIR / CLOSURE  2013   cellulitis rt arm-i/d   KNEE BURSECTOMY Left 09/24/2013   Procedure: IRRIGATION AND DEBRIDEMENT OF LEFT KNEE ;  Surgeon: Sheral Apley, MD;  Location: [facility];  Service:  Orthopedics;  Laterality: Left;   LAPAROTOMY N/A 12/30/2014   Procedure: EXPLORATORY LAPAROTOMY-graham patch of peptic ulcer;  Surgeon: Ricarda Frame, MD;  Location: [facility];  Service: General;  Laterality: N/A;     Home Medications   Prior to Admission medications   Medication Sig Start Date End Date Taking? Authorizing Provider  ampicillin (PRINCIPEN) 500 MG capsule Take 1 capsule (500 mg total) by mouth 4 (four) times daily. 04/25/22   Linzie Collin, MD  busPIRone (BUSPAR) 10 MG tablet Take 2 tablets (20 mg total) by mouth 2 (two) times daily. 04/15/22     clobetasol ointment (TEMOVATE) 0.05 % Apply 1 Application topically 2 (two) times a week. 04/21/22 06/15/23  Linzie Collin, MD  DULoxetine (CYMBALTA) 30 MG capsule Take 2 capsules (60mg  total) by mouth 2 (two) times daily. 04/30/21     DULoxetine (CYMBALTA) 30 MG capsule Take 1 capsule (30 mg total) by mouth in the morning AND 2 capsules (60 mg total) at bedtime. 04/15/22     estradiol (ESTRACE) 1 MG tablet Take 1.5 tablets (1.5 mg total) by mouth daily. 04/20/22 04/20/23  Linzie Collin, MD  hydrOXYzine (ATARAX) 25 MG tablet TAKE 1 TABLET BY MOUTH THREE TIMES DAILY 01/12/22     hydrOXYzine (ATARAX) 25 MG tablet Take 1 tablet (25 mg total) by mouth 3 (three) times daily as needed. 01/27/22  hydrOXYzine (ATARAX) 25 MG tablet Take 1 tablet (25 mg total) by mouth 3 (three) times daily as needed. 02/09/22     hydrOXYzine (ATARAX) 25 MG tablet Take 1 tablet (25 mg total) by mouth 3 (three) times daily as needed. 04/15/22     metroNIDAZOLE (FLAGYL) 500 MG tablet Take two tablets by mouth twice a day, for one day.  Or you can take all four tablets at once if you can tolerate it. 04/22/22   Linzie Collin, MD     Allergies  Hydrocodone and Aspirin   Family History   Family History  Problem Relation Age of Onset   Diabetes Mother    Diabetes Sister    Cancer Father        Throat   Heart disease Neg Hx    Breast cancer Neg Hx     Ovarian cancer Neg Hx    Colon cancer Neg Hx      Physical Exam  Triage Vital Signs: ED Triage Vitals  Encounter Vitals Group     BP --      Systolic BP Percentile --      Diastolic BP Percentile --      Pulse Rate 10/30/22 0320 88     Resp 10/30/22 0320 16     Temp 10/30/22 0320 (!) 97.3 F (36.3 C)     Temp Source 10/30/22 0320 Oral     SpO2 10/30/22 0315 99 %     Weight 10/30/22 0318 160 lb (72.6 kg)     Height 10/30/22 0318 5\' 3"  (1.6 m)     Head Circumference --      Peak Flow --      Pain Score 10/30/22 0317 8     Pain Loc --      Pain Education --      Exclude from Growth Chart --     Updated Vital Signs: Pulse 88   Temp (!) 97.3 F (36.3 C) (Oral)   Resp 16   Ht 5\' 3"  (1.6 m)   Wt 72.6 kg   SpO2 99%   BMI 28.34 kg/m    General: Awake, mild distress.  CV:  RRR.  Good peripheral perfusion.  Resp:  Normal effort.  CTAB. Abd:  Mild epigastric tenderness to palpation without rebound or guarding.  No distention.  No truncal vesicles. Other:  Bilateral calves are supple and nontender.   ED Results / Procedures / Treatments  Labs (all labs ordered are listed, but only abnormal results are displayed) Labs Reviewed  BASIC METABOLIC PANEL - Abnormal; Notable for the following components:      Result Value   Glucose, Bld 188 (*)    All other components within normal limits  CBC - Abnormal; Notable for the following components:   WBC 12.7 (*)    All other components within normal limits  HEPATIC FUNCTION PANEL - Abnormal; Notable for the following components:   AST 123 (*)    Alkaline Phosphatase 178 (*)    Total Bilirubin 1.4 (*)    Bilirubin, Direct 0.6 (*)    All other components within normal limits  LIPASE, BLOOD  TROPONIN I (HIGH SENSITIVITY)  TROPONIN I (HIGH SENSITIVITY)     EKG  ED ECG REPORT I, Jalani Rominger J, the attending physician, personally viewed and interpreted this ECG.   Date: 10/30/2022  EKG Time: 0319  Rate: 89  Rhythm:  normal sinus rhythm  Axis: Normal  Intervals:none  ST&T Change: Nonspecific  RADIOLOGY I have independently visualized and interpreted patient's x-ray as well as noted the radiology interpretation:  Chest x-ray: No acute cardiopulmonary process  Ultrasound: Hepatic steatosis, CBD dilatation to 9 mm  MRCP: Pending  Official radiology report(s): US ABDOMEN LIMITED RUQ (LIVER/GB)  Result Date: 10/30/2022 CLINICAL DATA:  57 year old female with history of upper abdominal pain. EXAM: ULTRASOUND ABDOMEN LIMITED RIGHT UPPER QUADRANT COMPARISON:  Abdominal ultrasound 03/18/2017. FINDINGS: Gallbladder: No gallstones or wall thickening visualized. No sonographic Murphy sign noted by sonographer. Common bile duct: Diameter: 9 mm in the porta hepatis. Liver: No focal lesion identified. Nodular liver contour. Diffusely increased hepatic parenchymal echogenicity. Portal vein is patent on color Doppler imaging with normal direction of blood flow towards the liver. Other: None. IMPRESSION: 1. Dilatation of the common bile duct (9 mm). No other acute findings are noted. If there is clinical concern for choledocholithiasis or other cause of biliary tract obstruction, further evaluation with MRI of the abdomen with and without IV gadolinium with MRCP should be considered. 2. Diffusely increased hepatic parenchymal echogenicity suggesting hepatic steatosis. There is also a nodular contour of the liver suggesting underlying cirrhosis. Electronically Signed   By: [name] M.D.   On: 10/30/2022 05:19   DG Chest 2 View  Result Date: 10/30/2022 CLINICAL DATA:  57 year old female with chest pain, epigastric pain, nausea vomiting. Symptom onset at midnight. Former smoker. EXAM: CHEST - 2 VIEW COMPARISON:  Chest radiographs 02/25/2021 and earlier. FINDINGS: AP and lateral views at 0345 hours. Lower lung volumes today, particularly on the AP view. Mediastinal contours remain normal. Visualized tracheal air  column is within normal limits. Chronic increased, symmetric pulmonary interstitial markings. No pneumothorax, pulmonary edema, pleural effusion or confluent lung opacity. No acute osseous abnormality identified. Paucity of bowel gas in the visible abdomen. IMPRESSION: No acute cardiopulmonary abnormality. Mild chronic and probably smoking related pulmonary interstitial changes. Electronically Signed   By: Odessa Fleming M.D.   On: 10/30/2022 04:40     PROCEDURES:  Critical Care performed: No  .1-3 Lead EKG Interpretation  Performed by: Irean Hong, MD Authorized by: Irean Hong, MD     Interpretation: normal     ECG rate:  85   ECG rate assessment: normal     Rhythm: sinus rhythm     Ectopy: none     Conduction: normal   Comments:     Patient placed on cardiac monitor to evaluate for arrhythmias    MEDICATIONS ORDERED IN ED: Medications  HYDROmorphone (DILAUDID) injection 0.5 mg (has no administration in time range)  gadobutrol (GADAVIST) 1 MMOL/ML injection 7 mL (has no administration in time range)  morphine (PF) 4 MG/ML injection 4 mg (4 mg Intravenous Given 10/30/22 0344)  ondansetron (ZOFRAN) injection 4 mg (4 mg Intravenous Given 10/30/22 0423)  sodium chloride 0.9 % bolus 1,000 mL (0 mLs Intravenous Stopped 10/30/22 0536)  famotidine (PEPCID) IVPB 20 mg premix (0 mg Intravenous Stopped 10/30/22 0525)     IMPRESSION / MDM / ASSESSMENT AND PLAN / ED COURSE  I reviewed the triage vital signs and the nursing notes.                             57 year old female presenting with epigastric/chest pain. Differential diagnosis includes, but is not limited to, ACS, aortic dissection, pulmonary embolism, cardiac tamponade, pneumothorax, pneumonia, pericarditis, myocarditis, GI-related causes including esophagitis/gastritis, and musculoskeletal chest wall pain.   I have personally reviewed  patient's records and note a GYN well woman exam on 04/20/2022.  Patient's presentation is most  consistent with acute presentation with potential threat to life or bodily function.  The patient is on the cardiac monitor to evaluate for evidence of arrhythmia and/or significant heart rate changes.  Laboratory results demonstrate mild leukocytosis with WBC 12.7, unremarkable electrolytes and troponin.  Will add LFTs/lipase, repeat troponin, obtain right upper quadrant abdominal ultrasound to start.  Administer IV fluid hydration, low-dose IV Dilaudid for pain and Zofran for nausea.  Clinical Course as of 10/30/22 4098  Wynelle Link Oct 30, 2022  1191 Updated patient and family member on ultrasound concerning for dilated CBD.  Will repeat troponin and proceed to MRCP. [JS]  [id number] Patient currently in MRCP.  Repeat troponin remains normal.  Updated patient's family member.  Care will be transferred to the oncoming provider at change of shift pending results of MRCP and disposition. [JS]    Clinical Course User Index [JS] Irean Hong, MD     FINAL CLINICAL IMPRESSION(S) / ED DIAGNOSES   Final diagnoses:  Nonspecific chest pain  Epigastric pain     Rx / DC Orders   ED Discharge Orders     None        Note:  This document was prepared using Dragon voice recognition software and may include unintentional dictation errors.   Irean Hong, MD 10/30/22 [phone]

## 2022-10-31 ENCOUNTER — Telehealth: Payer: Self-pay

## 2022-10-31 DIAGNOSIS — Z01419 Encounter for gynecological examination (general) (routine) without abnormal findings: Secondary | ICD-10-CM

## 2022-10-31 MED ORDER — ESTRADIOL 1 MG PO TABS: 1.5000 mg | ORAL_TABLET | Freq: Every day | ORAL | 1 refills | Status: DC

## 2022-10-31 NOTE — Telephone Encounter (Signed)
Pt calling in to state a pharmacy change from [facility] to [facility] on [address].

## 2022-10-31 NOTE — Telephone Encounter (Signed)
Patient called to schedule an office from the ED she has an referral.

## 2022-11-02 ENCOUNTER — Telehealth: Payer: Self-pay

## 2022-11-02 NOTE — Progress Notes (Unsigned)
Cardiology Office Note:  .   Date:  11/03/2022  ID:  Brandy Robinson, DOB [birth year], MRN [id number] PCP: Armando Gang, FNP  [facility] Providers Cardiologist:  None     Chief Complaint  Patient presents with   New Patient (Initial Visit)    New pt appt. Referreed for mitral and aortic valve regurgitation. Patient feels well. Medications reviewed verbally.    Patient Profile: .     Brandy Robinson is a overweight/borderline obese 57 y.o. female "former "smoker (by report quit in 2022, but restarted) with a PMH notable for Mildly Reduced EF by Echo (with Prior DHF in setting of PNA), Anxiety/Bipolar Disorder, dyspareunia (surgical menopause-TAH 1995) and endometriosis, GERD who presents here for "evaluation of a leaking valve" at the request of Armando Gang, FNP    Brandy Robinson was seen in [facility] ER on 10/30/2022 with complaints of acute onset chest/epigastric pain and nausea vomiting. (Per EMS run chart blood pressures were in the 148/71-167/79 mmHg range.  Report symptoms were chest epigastric pain with nausea and vomiting).  EKG was felt to be normal and troponins were negative x 2.  Had elevated AST and T. bili.  RUQ US showed common bile duct dilation but no obvious Colelithiasis or cholecystitis.  MRCP did not show any significant findings beyond distended gallbladder without gallstones or sludge.  Recommended outpatient GI follow-up.  There was a possibility that the patient passed a stone there was no longer present..  Scheduled follow-up with GI but also with cardiology  Subjective  Discussed the use of AI scribe software for clinical note transcription with the patient, who gave verbal consent to proceed.  History of Present Illness   The patient, is a current smoker with a history of hypertension and diabetes, was referred to cardiology due to concerns of poor circulation in the feet, which are persistently numb and painful. The primary care physician had  conducted an ultrasound of the heart and arteries in August, which reportedly revealed a leaking valve on the left side. The patient was unable to provide further details about the ultrasound findings.  The patient also reported occasional chest pain, described as a fluttering sensation, lasting a few minutes and not associated with exertion. These episodes were thought to be related to stress and were managed with anxiolytics as needed. The patient also reported intermittent dizziness, not associated with the palpitations, which resolved with rest.  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 The patient denied any associated chest tightness or pressure. There was no reported orthopnea or paroxysmal nocturnal dyspnea. The patient also denied any leg swelling, but reported constant numbness and pain in the feet, which was not relieved by rest.  The patient's medical history included a hysterectomy in 1995, a stomach surgery in 2016 due to a hole in the stomach, and a hernia repair in 2021. The patient also had a recent emergency room visit for a suspected gallstone. The patient is a current smoker, with a failed quit attempt in 2022, and is planning to quit again.  The patient's current medications include lisinopril, Jardiance, Buspar, clobetasol ointment as needed, Cymbalta, Estradiol, Protonix, albuterol inhaler, rosuvastatin, tramadol, ondansetron, and Trelegy inhaler. The patient had stopped taking hydroxyzine and was no longer on oxycodone, which was prescribed for pain after the gallstone episode.  The patient's most recent labs in September showed normal kidney function, sodium, and potassium levels. The total cholesterol was 123, triglycerides  were slightly elevated at 182, HDL was 51, and LDL was 36. The patient's HbA1c was 7.2, indicating suboptimal glycemic control.     Cardiovascular ROS: positive for - dyspnea on exertion, palpitations,  rapid heart rate, and short-lived episodes. negative for - chest pain, edema, orthopnea, paroxysmal nocturnal dyspnea, shortness of breath, or true lightheadedness, dizziness wooziness/syncope/near syncope, TIA or amaurosis fugax, claudication  ROS:  Review of Systems - Negative except shortness of breath on exertion, leg numbness and tingling as well as unsteady gait.  Otherwise as noted above.    Objective   Studies Reviewed: Marland Kitchen   EKG Interpretation Date/Time:  Thursday November 03 2022 09:26:50 EDT Ventricular Rate:  85 PR Interval:  102 QRS Duration:  82 QT Interval:  404 QTC Calculation: 480 R Axis:   56  Text Interpretation: Sinus rhythm with short PR Nonspecific ST and T wave abnormality Prolonged QT When compared with ECG of 30-Oct-2022 03:19, Premature atrial complexes NO LONGER PRESENT Otherwise no significant change Confirmed by Bryan Lemma ([id number]) on 11/03/2022 4:44:16 PM    Results LABS BMP: Na 139, K 4.3, Cr 1.0 (09/23/2022); Lipid Panel: Total cholesterol 123, Triglycerides 182, HDL 51, LDL 36 (09/23/2022); HbA1c: 7.2 (09/23/2022)  DIAGNOSTIC Echocardiogram: EF  52%, mild thickening, abnormal relaxation, mitral regurgitation, tricuspid regurgitation, aortic valve regurgitation (08/2022) -- full report not available LE ABIs - per report, no significant abnormalities.  ECHO  01/01/2015 [facility]): EF 50 to 55%.  Low normal range.  No RWMA.  Mild MR. 03/22/2017 [facility]): EF 45 to 50%.  Mildly reduced.  No RWMA.  GR 1 DD.   Risk Assessment/Calculations:             Physical Exam:   VS:  BP 138/76 (BP Location: Right Arm, Patient Position: Sitting, Cuff Size: Normal)   Pulse 85   Ht 5\' 3"  (1.6 m)   Wt 159 lb 12.8 oz (72.5 kg)   SpO2 97%   BMI 28.31 kg/m    Wt Readings from Last 3 Encounters:  11/03/22 159 lb 12.8 oz (72.5 kg)  10/30/22 160 lb (72.6 kg)  04/20/22 172 lb 1.6 oz (78.1 kg)    GEN: Well nourished, well groomed, in no acute distress;  healthy-appearing NECK: No JVD; No carotid bruits CARDIAC: Normal S1, S2; RRR, no murmurs, rubs, gallops RESPIRATORY:  Clear to auscultation without rales, wheezing or rhonchi ; nonlabored, good air movement.  With prolonged auscultation, I do not hear any murmurs. ABDOMEN: Soft, non-tender, non-distended EXTREMITIES:  No edema; No deformity      ASSESSMENT AND PLAN: .    Problem List Items Addressed This Visit       Cardiology Problems   Essential hypertension (Chronic)    ypertension Blood pressure at upper limit of normal during visit. Currently on Lisinopril 20mg  daily. -Continue Lisinopril 20mg  daily.      Relevant Medications   lisinopril (ZESTRIL) 20 MG tablet   rosuvastatin (CRESTOR) 20 MG tablet   Hyperlipidemia associated with type 2 diabetes mellitus (HCC) (Chronic)    Hyperlipidemia On Rosuvastatin 20mg  daily. Last total cholesterol was 123, LDL 36, HDL 51, and triglycerides 725. -Continue Rosuvastatin 20mg  daily.  Type 2 Diabetes Mellitus On Jardiance 25mg  daily. Last HbA1c was 7.2. -Continue Jardiance 25mg  daily.      Relevant Medications   JARDIANCE 25 MG TABS tablet   lisinopril (ZESTRIL) 20 MG tablet   rosuvastatin (CRESTOR) 20 MG tablet   Valvular heart disease - Primary    By report, echocardiogram in  August 2024 showed functional mitral and aortic regurgitation. No murmurs heard on physical exam. Unfortunate I do not have a full echo report nor do I have the images to review.  We will send release of information paperwork to PCPs office to get these results-both the test result and the actual images on CD. -Await echocardiogram report for further assessment.  -Plan to reassess at end of year or early next year.      Relevant Medications   lisinopril (ZESTRIL) 20 MG tablet   rosuvastatin (CRESTOR) 20 MG tablet     Other   Acute respiratory failure (HCC)    Now resolved      Relevant Orders   EKG 12-Lead (Completed)   Current smoker (Chronic)     Current smoker with recent unsuccessful quit attempt. -Encourage smoking cessation.  Discussed 1 800-quitnow      Generalized anxiety disorder (Chronic)    Experiencing palpitations likely related to stress. On Buspar 15mg  daily. -Continue Buspar 15mg  daily.  Also has PRN clonazepam and hydroxyzine      Relevant Medications   busPIRone (BUSPAR) 15 MG tablet   DULoxetine (CYMBALTA) 60 MG capsule   Healthcare maintenance (Chronic)    General Health Maintenance -Continue current medications including Protonix 40mg , Albuterol inhaler, Tramadol, Ondansetron, Cymbalta, Estradiol, and Clobetasol ointment as needed. -Plan to reassess patient's symptoms and potentially discuss further testing (e.g., calcium scores, stress tests) at next visit.       Peripheral neuropathy (Chronic)    Peripheral Neuropathy Chronic numbness and pain in feet. No evidence of arterial blockages in lower extremities. -Continue current management.      Relevant Medications   busPIRone (BUSPAR) 15 MG tablet   clonazePAM (KLONOPIN) 0.5 MG tablet   DULoxetine (CYMBALTA) 60 MG capsule       Need to send off to PCPs office to get the actual echo report, as well as the films on CD.  Dispo: Return in about 3 months (around 02/03/2023).  Total time spent: 29 min spent with patient + 22 min spent charting = 51 min     Signed, Marykay Lex, MD, MS  Bryan Lemma, M.D., M.S. Interventional Cardiologist  [facility]   [address]; [address] [place], [place]  [address] [phone]           Fax [phone]

## 2022-11-02 NOTE — Telephone Encounter (Signed)
The patient is scheduled to see Bryan Lemma, MD on 11-03-22 as a new patient. Called and left a voice message to inquire about any previous cardiac history. Requested the patient to call back at their earliest convenience.

## 2022-11-03 ENCOUNTER — Encounter: Payer: Self-pay | Admitting: Cardiology

## 2022-11-03 ENCOUNTER — Ambulatory Visit: Payer: Medicaid Other | Attending: Cardiology | Admitting: Cardiology

## 2022-11-03 VITALS — BP 138/76 | HR 85 | Ht 63.0 in | Wt 159.8 lb

## 2022-11-03 DIAGNOSIS — F411 Generalized anxiety disorder: Secondary | ICD-10-CM

## 2022-11-03 DIAGNOSIS — J9600 Acute respiratory failure, unspecified whether with hypoxia or hypercapnia: Secondary | ICD-10-CM

## 2022-11-03 DIAGNOSIS — E785 Hyperlipidemia, unspecified: Secondary | ICD-10-CM

## 2022-11-03 DIAGNOSIS — G629 Polyneuropathy, unspecified: Secondary | ICD-10-CM

## 2022-11-03 DIAGNOSIS — I38 Endocarditis, valve unspecified: Secondary | ICD-10-CM | POA: Diagnosis not present

## 2022-11-03 DIAGNOSIS — F17200 Nicotine dependence, unspecified, uncomplicated: Secondary | ICD-10-CM | POA: Diagnosis not present

## 2022-11-03 DIAGNOSIS — E1169 Type 2 diabetes mellitus with other specified complication: Secondary | ICD-10-CM

## 2022-11-03 DIAGNOSIS — Z0000 Encounter for general adult medical examination without abnormal findings: Secondary | ICD-10-CM

## 2022-11-03 DIAGNOSIS — I10 Essential (primary) hypertension: Secondary | ICD-10-CM

## 2022-11-03 NOTE — Assessment & Plan Note (Signed)
Hyperlipidemia On Rosuvastatin 20mg  daily. Last total cholesterol was 123, LDL 36, HDL 51, and triglycerides 161. -Continue Rosuvastatin 20mg  daily.  Type 2 Diabetes Mellitus On Jardiance 25mg  daily. Last HbA1c was 7.2. -Continue Jardiance 25mg  daily.

## 2022-11-03 NOTE — Assessment & Plan Note (Signed)
General Health Maintenance -Continue current medications including Protonix 40mg , Albuterol inhaler, Tramadol, Ondansetron, Cymbalta, Estradiol, and Clobetasol ointment as needed. -Plan to reassess patient's symptoms and potentially discuss further testing (e.g., calcium scores, stress tests) at next visit.

## 2022-11-03 NOTE — Assessment & Plan Note (Signed)
Now resolved.

## 2022-11-03 NOTE — Assessment & Plan Note (Signed)
By report, echocardiogram in August 2024 showed functional mitral and aortic regurgitation. No murmurs heard on physical exam. Unfortunate I do not have a full echo report nor do I have the images to review.  We will send release of information paperwork to PCPs office to get these results-both the test result and the actual images on CD. -Await echocardiogram report for further assessment.  -Plan to reassess at end of year or early next year.

## 2022-11-03 NOTE — Assessment & Plan Note (Signed)
Peripheral Neuropathy Chronic numbness and pain in feet. No evidence of arterial blockages in lower extremities. -Continue current management.

## 2022-11-03 NOTE — Assessment & Plan Note (Signed)
Current smoker with recent unsuccessful quit attempt. -Encourage smoking cessation.  Discussed 1 [id number]

## 2022-11-03 NOTE — Assessment & Plan Note (Signed)
Experiencing palpitations likely related to stress. On Buspar 15mg  daily. -Continue Buspar 15mg  daily.  Also has PRN clonazepam and hydroxyzine

## 2022-11-03 NOTE — Assessment & Plan Note (Signed)
ypertension Blood pressure at upper limit of normal during visit. Currently on Lisinopril 20mg  daily. -Continue Lisinopril 20mg  daily.

## 2022-11-03 NOTE — Patient Instructions (Signed)
Medication Instructions:  The current medical regimen is effective;  continue present plan and medications.  *If you need a refill on your cardiac medications before your next appointment, please call your pharmacy*   Follow-Up: At [facility], you and your health needs are our priority.  As part of our continuing mission to provide you with exceptional heart care, we have created designated Provider Care Teams.  These Care Teams include your primary Cardiologist (physician) and Advanced Practice Providers (APPs -  Physician Assistants and Nurse Practitioners) who all work together to provide you with the care you need, when you need it.  We recommend signing up for the patient portal called "MyChart".  Sign up information is provided on this After Visit Summary.  MyChart is used to connect with patients for Virtual Visits (Telemedicine).  Patients are able to view lab/test results, encounter notes, upcoming appointments, etc.  Non-urgent messages can be sent to your provider as well.   To learn more about what you can do with MyChart, go to [email or web address].    Your next appointment:   January 2025   Provider:   Bryan Lemma, MD    Other Instructions We will work on getting the ECHO report from your primary care provider.

## 2022-11-07 ENCOUNTER — Ambulatory Visit: Payer: Self-pay | Admitting: Surgery

## 2022-12-07 ENCOUNTER — Other Ambulatory Visit: Payer: Self-pay

## 2022-12-07 ENCOUNTER — Encounter: Payer: Self-pay | Admitting: Physician Assistant

## 2022-12-07 ENCOUNTER — Ambulatory Visit: Payer: Medicaid Other | Admitting: Physician Assistant

## 2022-12-07 VITALS — BP 115/57 | HR 83 | Temp 98.1°F | Ht 63.0 in | Wt 163.0 lb

## 2022-12-07 DIAGNOSIS — R1011 Right upper quadrant pain: Secondary | ICD-10-CM

## 2022-12-07 DIAGNOSIS — R748 Abnormal levels of other serum enzymes: Secondary | ICD-10-CM

## 2022-12-07 DIAGNOSIS — K838 Other specified diseases of biliary tract: Secondary | ICD-10-CM

## 2022-12-07 DIAGNOSIS — R1013 Epigastric pain: Secondary | ICD-10-CM

## 2022-12-07 DIAGNOSIS — R7401 Elevation of levels of liver transaminase levels: Secondary | ICD-10-CM

## 2022-12-07 DIAGNOSIS — R0789 Other chest pain: Secondary | ICD-10-CM | POA: Diagnosis not present

## 2022-12-07 DIAGNOSIS — R7989 Other specified abnormal findings of blood chemistry: Secondary | ICD-10-CM

## 2022-12-07 DIAGNOSIS — R112 Nausea with vomiting, unspecified: Secondary | ICD-10-CM

## 2022-12-07 DIAGNOSIS — K760 Fatty (change of) liver, not elsewhere classified: Secondary | ICD-10-CM

## 2022-12-07 DIAGNOSIS — Z8711 Personal history of peptic ulcer disease: Secondary | ICD-10-CM

## 2022-12-07 DIAGNOSIS — Z8719 Personal history of other diseases of the digestive system: Secondary | ICD-10-CM

## 2022-12-07 MED ORDER — PANTOPRAZOLE SODIUM 40 MG PO TBEC
40.0000 mg | DELAYED_RELEASE_TABLET | Freq: Every day | ORAL | 3 refills | Status: AC
  Filled 2022-12-07: qty 90, 90d supply, fill #0

## 2022-12-07 MED ORDER — SUCRALFATE 1 G PO TABS
1.0000 g | ORAL_TABLET | Freq: Three times a day (TID) | ORAL | 2 refills | Status: DC
  Filled 2022-12-07: qty 90, 30d supply, fill #0

## 2022-12-07 NOTE — Progress Notes (Addendum)
[name], PA-C [address]  [address]  [place], [place] [address]  Main: [phone]  Fax: [phone]   Gastroenterology Consultation  Referring Provider:     Armando Gang, FNP Primary Care Physician:  Armando Gang, FNP Primary Gastroenterologist:  [name], PA-C / Dr. Wyline Mood   Reason for Consultation:     ED Followup RUQ and epigastric pain        HPI:   Brandy Robinson is a 57 y.o. y/o female referred for consultation & management  by Armando Gang, FNP.    She went to [facility] ED 10/30/2022 to evaluate acute chest pain, epigastric pain, nausea and vomiting.  Cardiac workup unrevealing.  Negative troponin.  Labs showed elevated AST 123, alk phos 178, and total bilirubin 1.4.  RUQ ultrasound showed common bile duct dilation 9mm without evidence of stones or cholecystitis.  Evidence of hepatic steatosis.  Follow-up MRCP showed CBD 6 mm, no bile duct stone or obstruction, and hepatic steatosis.  Distended gallbladder with no gallstones or sludge.  Outpatient GI follow-up and EUS were recommended.  Current symptoms: She continues to have episodes of RUQ and epigastric pain after eating for the past 6 months.  He pain is becoming more severe.  She has episodes of nausea and vomiting.  She also has intermittent diarrhea.  Denies melena or hematochezia.  Currently taking Protonix 40 Mg daily for GERD and due to hx PUD.   10/30/2022 labs: Glucose 188, BUN 12, creatinine 0.70, alk phos 178, AST 123, ALT 32, T. bili 1.4, normal lipase 38.  WBC 12.7.  Hemoglobin 14.0.  Alcohol: None NSAIDs:  Rare Ibuprofen and occasional Tylenol or Tramadol prn pain.  Previous GI evaluation:  -Patient reports having stomach surgery in 2016 at [facility] due to a perforated gastric ulcer (from taking Goody Powders), and Abdominal wall hernia repair with Mesh placed in 2021 at [facility].  Hysterectomy 1995.   -Patient reports having 2 previous colonoscopies: 1 at [facility] clinic  4-5 years ago (results unavailable) and before that 1 at [facility] around 2011.  -Gastric Empty Study at [facility] in 2011 showed Gastroparesis.  Medical history significant for mitral and aortic valve regurgitation, smoker, anxiety/bipolar disorder, GERD.  She followed up with cardiology 11/03/2022.   Recent echocardiogram LVEF 52%.  Past Medical History:  Diagnosis Date   Anxiety    Bipolar disorder (HCC)    Depression    Disc disorder    Dyspareunia    Endometriosis    GERD (gastroesophageal reflux disease)    Insomnia    Lichen    Surgical menopause    Vaginal atrophy    Wears glasses     Past Surgical History:  Procedure Laterality Date   ABDOMINAL HYSTERECTOMY  01/17/1993   [facility]-bso   ARM WOUND REPAIR / CLOSURE  01/18/2011   cellulitis rt arm-i/d   KNEE BURSECTOMY Left 09/24/2013   Procedure: IRRIGATION AND DEBRIDEMENT OF LEFT KNEE ;  Surgeon: Sheral Apley, MD;  Location: [facility];  Service: Orthopedics;  Laterality: Left;   LAPAROTOMY N/A 12/30/2014   Procedure: EXPLORATORY LAPAROTOMY-graham patch of peptic ulcer;  Surgeon: Ricarda Frame, MD;  Location: [facility] ORS;  Service: General;  Laterality: N/A;   Transthoracic echo     TRANSTHORACIC ECHOCARDIOGRAM  03/25/2017   01/01/2015 [facility]): EF 50 to 55%.  Low normal range.  No RWMA.  Mild MR.;; 03/2017:: 03/22/2017 [facility]): EF 45 to 50%.  Mildly reduced.  No  RWMA.  GR 1 DD.    Prior to Admission medications   Medication Sig Start Date End Date Taking? Authorizing Provider  ACCU-CHEK GUIDE test strip USE TO CHECK BLOOD SUGAR TWICE DAILY BEFORE MEALS 09/27/22   [provider]  Blood Glucose Monitoring Suppl (ACCU-CHEK GUIDE) w/Device KIT USE AS DIRECTED TO CHECK BLOOD GLUCOSE 09/29/22   [provider]  busPIRone (BUSPAR) 15 MG tablet Take 15 mg by mouth 2 (two) times daily. 10/25/22   [provider]  clobetasol ointment (TEMOVATE) 0.05 % Apply 1 Application topically 2 (two) times a week.  04/21/22 06/15/23  Linzie Collin, MD  clonazePAM (KLONOPIN) 0.5 MG tablet Take 0.5 mg by mouth at bedtime as needed. 10/21/22   [provider]  DULoxetine (CYMBALTA) 60 MG capsule Take 120 mg by mouth daily. 10/24/22   [provider]  estradiol (ESTRACE) 1 MG tablet Take 1.5 tablets (1.5 mg total) by mouth daily. 10/31/22 10/31/23  Linzie Collin, MD  JARDIANCE 25 MG TABS tablet Take 25 mg by mouth daily. 10/22/22   [provider]  lisinopril (ZESTRIL) 20 MG tablet Take 20 mg by mouth daily. 08/22/22   [provider]  ondansetron (ZOFRAN-ODT) 4 MG disintegrating tablet Take 4 mg by mouth 2 (two) times daily as needed. 10/17/22   [provider]  pantoprazole (PROTONIX) 40 MG tablet Take 40 mg by mouth daily. 09/13/22   [provider]  rosuvastatin (CRESTOR) 20 MG tablet Take 20 mg by mouth daily. 10/24/22   [provider]  traMADol (ULTRAM) 50 MG tablet Take 50 mg by mouth 2 (two) times daily as needed. 10/29/22   [provider]  VENTOLIN HFA 108 (90 Base) MCG/ACT inhaler SMARTSIG:1-2 Puff(s) By Mouth Every 4-6 Hours PRN 10/17/22   [provider]    Family History  Problem Relation Age of Onset   Diabetes Mother    Diabetes Sister    Cancer Father        Throat   Heart disease Neg Hx    Breast cancer Neg Hx    Ovarian cancer Neg Hx    Colon cancer Neg Hx      Social History   Tobacco Use   Smoking status: Every Day    Current packs/day: 0.00    Types: Cigarettes    Last attempt to quit: 10/26/2020    Years since quitting: 2.1   Smokeless tobacco: Former    Quit date: 03/17/2020   Tobacco comments:    1800quit now  Vaping Use   Vaping status: Some Days  Substance Use Topics   Alcohol use: No   Drug use: No    Allergies as of 12/07/2022 - Review Complete 12/07/2022  Allergen Reaction Noted   Hydrocodone Itching 09/24/2013   Aspirin Other (See Comments) 08/25/2013    Review of Systems:     All systems reviewed and negative except where noted in HPI.   Physical Exam:  BP (!) 115/57   Pulse 83   Temp 98.1 F (36.7 C)   Ht 5\' 3"  (1.6 m)   Wt 163 lb (73.9 kg)   BMI 28.87 kg/m  No LMP recorded. Patient has had a hysterectomy.  General:   Alert,  Well-developed, well-nourished, pleasant and cooperative in NAD Lungs:  Respirations even and unlabored.  Clear throughout to auscultation.   No wheezes, crackles, or rhonchi. No acute distress. Heart:  Regular rate and rhythm; no murmurs, clicks, rubs, or gallops. Abdomen:  Normal  bowel sounds.  No bruits.  Soft, and non-distended without masses, hepatosplenomegaly or hernias noted.  No Tenderness.  No guarding or rebound tenderness.    Neurologic:  Alert and oriented x3;  grossly normal neurologically. Psych:  Alert and cooperative. Normal mood and affect.  Imaging Studies: No results found.  Assessment and Plan:   Brandy Robinson is a 57 y.o. y/o female has been referred for ED follow-up of 6 month Hx of Post-Prandial RUQ Pain, epigastric pain, chest pain, nausea and vomiting.  Elevated LFTs.  RUQ ultrasound showed dilated common bile duct 9mm, but no gallstones.  Follow-up MRCP showed CBD 6mm, and no evidence of choledocholithiasis.  It was thought that she may have passed a bile duct stone.  Here for GI follow-up.  1.  Post-Prandial RUQ and Epigastric pain 2.  Noncardiac chest pain - Negative Cardiac Eval. 3.  Elevated LFTs: Alk phos, AST, T. bili 4.  Nausea and vomiting 5.  Common bile duct dilation (9 mm) with no evidence of cholelithiasis or choledocholithiasis.  MRCP showed no bile duct stone. 6.  Hepatic steatosis 7.  History of Perforated Gastric Ulcer s/p surgical repair 2016 8.  History of Gastroparesis (Dx 2011 - [facility] - Gastric Empty Study).  Plan: -Schedule EGD with EUS -Refer to general surgery to discuss potential elective cholecystectomy. -Repeat labs: CBC, BMP, Hepatic panel, GGT, AMA, ANA, ASMA. -Low-fat  diet, exercise, and weight loss to treat fatty liver disease. -Avoid NSAIDS -Continue Protonix 40mg  daily. -Rx Sucralfate 1g, 1 tablet TID before meals, #90, 2 RF.  Follow up 6 weeks.  [name], PA-C   Reviewed note :   With biliary  type of pain, dilated common bile duct, elevated LFTs particularly alkaline phosphatase need to consider other differentials such asSpincter of Oddi dysfunction type I, if evaluation for biliary pain is negative and LFTs are still elevated may then require liver biopsy to rule out small duct primary sclerosing cholangitis   Dr Wyline Mood MD,MRCP [facility]) Gastroenterology/Hepatology Pager: [phone]

## 2022-12-07 NOTE — Patient Instructions (Signed)
Referral sent to [facility]. Someone from their office will call to schedule appointment.  Referral to [facility] . Someone from their office will call to schedule an appointment.

## 2022-12-09 LAB — CBC WITH DIFFERENTIAL/PLATELET
Basophils Absolute: 0.1 10*3/uL (ref 0.0–0.2)
Basos: 1 %
EOS (ABSOLUTE): 0.2 10*3/uL (ref 0.0–0.4)
Eos: 3 %
Hematocrit: 38.1 % (ref 34.0–46.6)
Hemoglobin: 12.8 g/dL (ref 11.1–15.9)
Immature Grans (Abs): 0 10*3/uL (ref 0.0–0.1)
Immature Granulocytes: 0 %
Lymphocytes Absolute: 4.3 10*3/uL — ABNORMAL HIGH (ref 0.7–3.1)
Lymphs: 46 %
MCH: 30.1 pg (ref 26.6–33.0)
MCHC: 33.6 g/dL (ref 31.5–35.7)
MCV: 90 fL (ref 79–97)
Monocytes Absolute: 0.6 10*3/uL (ref 0.1–0.9)
Monocytes: 6 %
Neutrophils Absolute: 4.1 10*3/uL (ref 1.4–7.0)
Neutrophils: 44 %
Platelets: 232 10*3/uL (ref 150–450)
RBC: 4.25 x10E6/uL (ref 3.77–5.28)
RDW: 12 % (ref 11.7–15.4)
WBC: 9.2 10*3/uL (ref 3.4–10.8)

## 2022-12-09 LAB — HEPATIC FUNCTION PANEL
ALT: 11 [IU]/L (ref 0–32)
AST: 20 [IU]/L (ref 0–40)
Albumin: 4 g/dL (ref 3.8–4.9)
Alkaline Phosphatase: 162 [IU]/L — ABNORMAL HIGH (ref 44–121)
Bilirubin Total: 0.2 mg/dL (ref 0.0–1.2)
Bilirubin, Direct: 0.1 mg/dL (ref 0.00–0.40)
Total Protein: 6.3 g/dL (ref 6.0–8.5)

## 2022-12-09 LAB — BASIC METABOLIC PANEL
BUN/Creatinine Ratio: 8 — ABNORMAL LOW (ref 9–23)
BUN: 10 mg/dL (ref 6–24)
CO2: 20 mmol/L (ref 20–29)
Calcium: 9.3 mg/dL (ref 8.7–10.2)
Chloride: 101 mmol/L (ref 96–106)
Creatinine, Ser: 1.2 mg/dL — ABNORMAL HIGH (ref 0.57–1.00)
Glucose: 122 mg/dL — ABNORMAL HIGH (ref 70–99)
Potassium: 3.5 mmol/L (ref 3.5–5.2)
Sodium: 138 mmol/L (ref 134–144)
eGFR: 53 mL/min/{1.73_m2} — ABNORMAL LOW (ref >59)

## 2022-12-09 LAB — ANA: Anti Nuclear Antibody (ANA): NEGATIVE

## 2022-12-09 LAB — GAMMA GT: GGT: 71 [IU]/L — ABNORMAL HIGH (ref 0–60)

## 2022-12-09 LAB — LIPASE: Lipase: 39 U/L (ref 14–72)

## 2022-12-09 LAB — MITOCHONDRIAL/SMOOTH MUSCLE AB PNL
Mitochondrial Ab: <20 U (ref 0.0–20.0)
Smooth Muscle Ab: 2 U (ref 0–19)

## 2022-12-12 ENCOUNTER — Ambulatory Visit (INDEPENDENT_AMBULATORY_CARE_PROVIDER_SITE_OTHER): Payer: Medicaid Other | Admitting: Surgery

## 2022-12-12 ENCOUNTER — Encounter: Payer: Self-pay | Admitting: Surgery

## 2022-12-12 VITALS — BP 139/83 | HR 92 | Temp 98.3°F | Ht 63.0 in | Wt 160.8 lb

## 2022-12-12 DIAGNOSIS — R1011 Right upper quadrant pain: Secondary | ICD-10-CM

## 2022-12-12 NOTE — Progress Notes (Signed)
12/12/2022  Reason for Visit:  RUQ pain  Requesting Provider:  [name], PA-C  History of Present Illness: Brandy Robinson is a 57 y.o. female presents for evaluation of RUQ pain.  The patient presented to the ED on 10/30/22 with epigastric and RUQ pain associated with nausea and vomiting.  Labwork showed a total bilirubin of 1.4, AST 123, ALT 32, Alk Phos 178.  She had an ultrasound which did not show any gallstones or wall thickening, but CBD was 9 mm.  MRCP was also obtained which showed a distended gallbladder, no visible gallstones, CBD of 6 mm.  It was thought that she had passed a gallstone.  She was referred to GI for further evaluation.  The patient reports months history of RUQ / epigastric pain which is worsened by eating greasy foods.  She was diagnosed many years ago with gastroparesis on nuclear medicine gastric emptying study in 2011.  She has also had prior ultrasound in 2011 which also did not show gallstones or sludge, but then another ultrasound in 2021 which did show layering sludge.  She reports that after eating she will feel pain, bloatedness, and takes a few hours for the symptoms to go away.  Repeat labs on 12/07/22, showed normal total bilirubin of 0.2, normalized AST/ALT, and stable Alk Phos of 162.  Of note, the patient has a history of exploratory laparotomy with repair of perforated gastric ulcer in 2016 and a robotic ventral hernia repair at [facility] in 2021 which required a 18 x 33 retrorectus bard soft mesh.  Past Medical History: Past Medical History:  Diagnosis Date   Anxiety    Bipolar disorder (HCC)    Depression    Disc disorder    Dyspareunia    Endometriosis    GERD (gastroesophageal reflux disease)    Insomnia    Lichen    Surgical menopause    Vaginal atrophy    Wears glasses      Past Surgical History: Past Surgical History:  Procedure Laterality Date   ABDOMINAL HYSTERECTOMY  01/17/1993   [facility]-bso   ARM WOUND REPAIR / CLOSURE  01/18/2011    cellulitis rt arm-i/d   KNEE BURSECTOMY Left 09/24/2013   Procedure: IRRIGATION AND DEBRIDEMENT OF LEFT KNEE ;  Surgeon: Sheral Apley, MD;  Location: [facility];  Service: Orthopedics;  Laterality: Left;   LAPAROTOMY N/A 12/30/2014   Procedure: EXPLORATORY LAPAROTOMY-graham patch of peptic ulcer;  Surgeon: Ricarda Frame, MD;  Location: [facility] ORS;  Service: General;  Laterality: N/A;   Transthoracic echo     TRANSTHORACIC ECHOCARDIOGRAM  03/25/2017   01/01/2015 [facility]): EF 50 to 55%.  Low normal range.  No RWMA.  Mild MR.;; 03/2017:: 03/22/2017 [facility]): EF 45 to 50%.  Mildly reduced.  No RWMA.  GR 1 DD.    Home Medications: Prior to Admission medications   Medication Sig Start Date End Date Taking? Authorizing Provider  ACCU-CHEK GUIDE test strip USE TO CHECK BLOOD SUGAR TWICE DAILY BEFORE MEALS 09/27/22  Yes [provider]  Blood Glucose Monitoring Suppl (ACCU-CHEK GUIDE) w/Device KIT USE AS DIRECTED TO CHECK BLOOD GLUCOSE 09/29/22  Yes [provider]  busPIRone (BUSPAR) 15 MG tablet Take 15 mg by mouth 2 (two) times daily. 10/25/22  Yes [provider]  clobetasol ointment (TEMOVATE) 0.05 % Apply 1 Application topically 2 (two) times a week. 04/21/22 06/15/23 Yes Linzie Collin, MD  clonazePAM (KLONOPIN) 0.5 MG tablet Take 0.5 mg by mouth at bedtime as  needed. 10/21/22  Yes [provider]  DULoxetine (CYMBALTA) 60 MG capsule Take 120 mg by mouth daily. 10/24/22  Yes [provider]  estradiol (ESTRACE) 1 MG tablet Take 1.5 tablets (1.5 mg total) by mouth daily. 10/31/22 10/31/23 Yes Linzie Collin, MD  JARDIANCE 25 MG TABS tablet Take 25 mg by mouth daily. 10/22/22  Yes [provider]  lisinopril (ZESTRIL) 20 MG tablet Take 20 mg by mouth daily. 08/22/22  Yes [provider]  ondansetron (ZOFRAN-ODT) 4 MG disintegrating tablet Take 4 mg by mouth 2 (two) times daily as needed. 10/17/22  Yes [provider]   pantoprazole (PROTONIX) 40 MG tablet Take 1 tablet (40 mg total) by mouth daily. 12/07/22 12/02/23 Yes [name], PA-C  rosuvastatin (CRESTOR) 20 MG tablet Take 20 mg by mouth daily. 10/24/22  Yes [provider]  sucralfate (CARAFATE) 1 g tablet Take 1 tablet (1 g total) by mouth 3 (three) times daily before meals. 12/07/22 03/07/23 Yes [name], PA-C  traMADol (ULTRAM) 50 MG tablet Take 50 mg by mouth 2 (two) times daily as needed. 10/29/22  Yes [provider]  VENTOLIN HFA 108 (90 Base) MCG/ACT inhaler SMARTSIG:1-2 Puff(s) By Mouth Every 4-6 Hours PRN 10/17/22  Yes [provider]    Allergies: Allergies  Allergen Reactions   Hydrocodone Itching   Aspirin Other (See Comments)    Reaction:  GI upset     Social History:  reports that she has been smoking cigarettes. She quit smokeless tobacco use about 2 years ago. She reports that she does not drink alcohol and does not use drugs.   Family History: Family History  Problem Relation Age of Onset   Diabetes Mother    Diabetes Sister    Cancer Father        Throat   Heart disease Neg Hx    Breast cancer Neg Hx    Ovarian cancer Neg Hx    Colon cancer Neg Hx     Review of Systems: Review of Systems  Constitutional:  Negative for chills and fever.  HENT:  Negative for hearing loss.   Respiratory:  Negative for shortness of breath.   Cardiovascular:  Negative for chest pain.  Gastrointestinal:  Positive for abdominal pain, nausea and vomiting.  Genitourinary:  Negative for dysuria.  Musculoskeletal:  Negative for myalgias.  Skin:  Negative for rash.  Neurological:  Negative for dizziness.  Psychiatric/Behavioral:  Negative for depression.     Physical Exam BP 139/83   Pulse 92   Temp 98.3 F (36.8 C) (Oral)   Ht 5\' 3"  (1.6 m)   Wt 160 lb 12.8 oz (72.9 kg)   SpO2 96%   BMI 28.48 kg/m  CONSTITUTIONAL: No acute distress, well nourished. HEENT:  Normocephalic, atraumatic, extraocular  motion intact. NECK: Trachea is midline, and there is no jugular venous distension.  RESPIRATORY:  Lungs are clear, and breath sounds are equal bilaterally. Normal respiratory effort without pathologic use of accessory muscles. CARDIOVASCULAR: Heart is regular without murmurs, gallops, or rubs. GI: The abdomen is soft, non-distended, with some tenderness to palpation in the right upper quadrant and epigastric areas. Negative Murphy's sign.  Prior incisions are well healed, without evidence of hernia recurrence.  MUSCULOSKELETAL:  Normal muscle strength and tone in all four extremities.  No peripheral edema or cyanosis. SKIN: Skin turgor is normal. There are no pathologic skin lesions.  NEUROLOGIC:  Motor and sensation is grossly normal.  Cranial nerves are grossly intact.  PSYCH:  Alert and oriented to person, place and time. Affect is normal.  Laboratory Analysis: Labs on 12/07/22: Na 138, K 3.5, Cl 101, CO2 20, BUN 10, Cr 1.2.  Total bili 0.2, AST 20, ALT 11, Alk Phos 162, albumin 4.  WBC 9.2, Hgb 12.8, Hct 38.1, Plt 232.  Imaging: Ultrasound RUQ on 10/30/22: IMPRESSION: 1. Dilatation of the common bile duct (9 mm). No other acute findings are noted. If there is clinical concern for choledocholithiasis or other cause of biliary tract obstruction, further evaluation with MRI of the abdomen with and without IV gadolinium with MRCP should be considered. 2. Diffusely increased hepatic parenchymal echogenicity suggesting hepatic steatosis. There is also a nodular contour of the liver suggesting underlying cirrhosis.  MRCP on 10/30/22: IMPRESSION: 1. No acute findings in the abdomen to account for the patient's symptoms. 2. Specifically, no choledocholithiasis and no findings of biliary tract obstruction. 3. Hepatic steatosis. 4. Aortic atherosclerosis.   Assessment and Plan: This is a 57 y.o. female with RUQ pain and possible symptomatic cholelithiasis.  --Discussed with the patient the  findings on her imaging studies and labs.  She does have a history of gastroparesis and perforated gastric ulcer.  Discussed with her how these can contribute to her abdominal pain.  However, these should not be changing her LFTs, or causing changes in the size of her common bile duct as can be seen in different ultrasound studies that she's had.  I do think there is a component to her pain coming from her gallbladder.  She may have sludge as seen in other ultrasound study and may have passed either a gallstone or sludge when she presented to the ED.  Discussed with her that it is reasonable to proceed with surgery, but unfortunately could not guarantee her that all her symptoms would improve after surgery.  She is in agreement to proceed with surgery. --Discussed with her the plan for a robotic assisted cholecystectomy, and reviewed the surgery at length with her including the planned incisions, the risks of bleeding, infection, injury to surrounding structures, that this would be an outpatient surgery, post-operative activity restrictions, pain control, and she's willing to proceed. --Will schedule her for surgery on 12/21/22.  All of her questions have been answered.  I spent 55 minutes dedicated to the care of this patient on the date of this encounter to include pre-visit review of records, face-to-face time with the patient discussing diagnosis and management, and any post-visit coordination of care.   Howie Ill, MD [facility]

## 2022-12-12 NOTE — Patient Instructions (Signed)
You have requested to have your gallbladder removed. This will be done at [facility] with Dr. Aleen Campi.  You will most likely be out of work 1-2 weeks for this surgery.  If you have FMLA or disability paperwork that needs filled out you may drop this off at our office or this can be faxed to [phone].  You will return after your post-op appointment with a lifting restriction for approximately 4 more weeks.  You will be able to eat anything you would like to following surgery. But, start by eating a bland diet and advance this as tolerated. The Gallbladder diet is below, please go as closely by this diet as possible prior to surgery to avoid any further attacks.  Please see the (blue)pre-care form that you have been given today. Our surgery scheduler will call you to verify surgery date and to go over information.   If you have any questions, please call our office.  Laparoscopic Cholecystectomy Laparoscopic cholecystectomy is surgery to remove the gallbladder. The gallbladder is located in the upper right part of the abdomen, behind the liver. It is a storage sac for bile, which is produced in the liver. Bile aids in the digestion and absorption of fats. Cholecystectomy is often done for inflammation of the gallbladder (cholecystitis). This condition is usually caused by a buildup of gallstones (cholelithiasis) in the gallbladder. Gallstones can block the flow of bile, and that can result in inflammation and pain. In severe cases, emergency surgery may be required. If emergency surgery is not required, you will have time to prepare for the procedure. Laparoscopic surgery is an alternative to open surgery. Laparoscopic surgery has a shorter recovery time. Your common bile duct may also need to be examined during the procedure. If stones are found in the common bile duct, they may be removed. LET [facility] CARE PROVIDER KNOW ABOUT: Any allergies you have. All medicines you are taking,  including vitamins, herbs, eye drops, creams, and over-the-counter medicines. Previous problems you or members of your family have had with the use of anesthetics. Any blood disorders you have. Previous surgeries you have had.  Any medical conditions you have. RISKS AND COMPLICATIONS Generally, this is a safe procedure. However, problems may occur, including: Infection. Bleeding. Allergic reactions to medicines. Damage to other structures or organs. A stone remaining in the common bile duct. A bile leak from the cyst duct that is clipped when your gallbladder is removed. The need to convert to open surgery, which requires a larger incision in the abdomen. This may be necessary if your surgeon thinks that it is not safe to continue with a laparoscopic procedure. BEFORE THE PROCEDURE Ask your health care provider about: Changing or stopping your regular medicines. This is especially important if you are taking diabetes medicines or blood thinners. Taking medicines such as aspirin and ibuprofen. These medicines can thin your blood. Do not take these medicines before your procedure if your health care provider instructs you not to. Follow instructions from your health care provider about eating or drinking restrictions. Let your health care provider know if you develop a cold or an infection before surgery. Plan to have someone take you home after the procedure. Ask your health care provider how your surgical site will be marked or identified. You may be given antibiotic medicine to help prevent infection. PROCEDURE To reduce your risk of infection: Your health care team will wash or sanitize their hands. Your skin will be washed with soap. An IV  tube may be inserted into one of your veins. You will be given a medicine to make you fall asleep (general anesthetic). A breathing tube will be placed in your mouth. The surgeon will make several small cuts (incisions) in your abdomen. A thin,  lighted tube (laparoscope) that has a tiny camera on the end will be inserted through one of the small incisions. The camera on the laparoscope will send a picture to a TV screen (monitor) in the operating room. This will give the surgeon a good view inside your abdomen. A gas will be pumped into your abdomen. This will expand your abdomen to give the surgeon more room to perform the surgery. Other tools that are needed for the procedure will be inserted through the other incisions. The gallbladder will be removed through one of the incisions. After your gallbladder has been removed, the incisions will be closed with stitches (sutures), staples, or skin glue. Your incisions may be covered with a bandage (dressing). The procedure may vary among health care providers and hospitals. AFTER THE PROCEDURE Your blood pressure, heart rate, breathing rate, and blood oxygen level will be monitored often until the medicines you were given have worn off. You will be given medicines as needed to control your pain.   This information is not intended to replace advice given to you by your health care provider. Make sure you discuss any questions you have with your health care provider.   Document Released: 01/03/2005 Document Revised: 09/24/2014 Document Reviewed: 08/15/2012 Elsevier Interactive Patient Education 2016 Elsevier Inc.   Low-Fat Diet for Gallbladder Conditions A low-fat diet can be helpful if you have pancreatitis or a gallbladder condition. With these conditions, your pancreas and gallbladder have trouble digesting fats. A healthy eating plan with less fat will help rest your pancreas and gallbladder and reduce your symptoms. WHAT DO I NEED TO KNOW ABOUT THIS DIET? Eat a low-fat diet. Reduce your fat intake to less than 20-30% of your total daily calories. This is less than 50-60 g of fat per day. Remember that you need some fat in your diet. Ask your dietician what your daily goal should  be. Choose nonfat and low-fat healthy foods. Look for the words "nonfat," "low fat," or "fat free." As a guide, look on the label and choose foods with less than 3 g of fat per serving. Eat only one serving. Avoid alcohol. Do not smoke. If you need help quitting, talk with your health care provider. Eat small frequent meals instead of three large heavy meals. WHAT FOODS CAN I EAT? Grains Include healthy grains and starches such as potatoes, wheat bread, fiber-rich cereal, and brown rice. Choose whole grain options whenever possible. In adults, whole grains should account for 45-65% of your daily calories.  Fruits and Vegetables Eat plenty of fruits and vegetables. Fresh fruits and vegetables add fiber to your diet. Meats and Other Protein Sources Eat lean meat such as chicken and pork. Trim any fat off of meat before cooking it. Eggs, fish, and beans are other sources of protein. In adults, these foods should account for 10-35% of your daily calories. Dairy Choose low-fat milk and dairy options. Dairy includes fat and protein, as well as calcium.  Fats and Oils Limit high-fat foods such as fried foods, sweets, baked goods, sugary drinks.  Other Creamy sauces and condiments, such as mayonnaise, can add extra fat. Think about whether or not you need to use them, or use smaller amounts or low fat options.  WHAT FOODS ARE NOT RECOMMENDED? High fat foods, such as: [employer]. Ice cream. [place] toast. Sweet rolls. Pizza. Cheese bread. Foods covered with batter, butter, creamy sauces, or cheese. Fried foods. Sugary drinks and desserts. Foods that cause gas or bloating   This information is not intended to replace advice given to you by your health care provider. Make sure you discuss any questions you have with your health care provider.   Document Released: 01/08/2013 Document Reviewed: 01/08/2013 Elsevier Interactive Patient Education [employer]

## 2022-12-13 ENCOUNTER — Telehealth: Payer: Self-pay | Admitting: Surgery

## 2022-12-13 NOTE — Telephone Encounter (Signed)
Left message for patient to call, please inform her of the following regarding scheduled surgery with Dr. Aleen Campi.   Pre-Admission date/time, and Surgery date at [facility].  Surgery Date: 01/19/23 Preadmission Testing Date: 01/09/23 (phone 1p-4p)  Also patient will need to call at [phone], between 1-3:00pm the day before surgery, to find out what time to arrive for surgery.

## 2022-12-19 ENCOUNTER — Other Ambulatory Visit: Payer: Medicaid Other

## 2022-12-19 NOTE — Telephone Encounter (Signed)
Left another message for patient to call.  Blue surgery instruction sheet is also mailed

## 2022-12-20 ENCOUNTER — Other Ambulatory Visit: Payer: Self-pay

## 2023-01-09 ENCOUNTER — Encounter
Admission: RE | Admit: 2023-01-09 | Discharge: 2023-01-09 | Disposition: A | Discharge status: Home / Self Care | Payer: Medicaid Other | Source: Ambulatory Visit | Attending: Surgery | Admitting: Surgery

## 2023-01-09 ENCOUNTER — Encounter: Payer: Self-pay | Admitting: Surgery

## 2023-01-09 ENCOUNTER — Other Ambulatory Visit: Payer: Self-pay

## 2023-01-09 NOTE — Patient Instructions (Addendum)
Your procedure is scheduled WR:[id number],  Jan 19, 2023 Report to the Registration Desk on the 1st floor of the [facility] To find out your arrival time, please call [phone] between 1PM - 3PM on: Dec.31/2024 If your arrival time is 6:00 am, do not arrive before that time as the [facility] entrance doors do not open until 6:00 am.  REMEMBER: Instructions that are not followed completely may result in serious medical risk, up to and including death; or upon the discretion of your surgeon and anesthesiologist your surgery may need to be rescheduled.  Do not eat food after midnight the night before surgery.  No gum chewing or hard candies.  You may however, drink CLEAR liquids up to 2 hours before you are scheduled to arrive for your surgery. Do not drink anything within 2 hours of your scheduled arrival time.  Clear liquids include: - water  -    One week prior to surgery: Stop Anti-inflammatories (NSAIDS) such as Advil, Aleve, Ibuprofen, Motrin, Naproxen, Naprosyn and Aspirin based products such as Excedrin, Goody's Powder, BC Powder. Stop ANY OVER THE COUNTER supplements until after surgery.  You may however, continue to take Tylenol if needed for pain up until the day of surgery.  Stop Jardiance 3 days prior to surgery. Last dose will be Dec. 29, Sunday    Continue taking all of your other prescription medications up until the day of surgery.  ON THE DAY OF SURGERY ONLY TAKE THESE MEDICATIONS WITH SIPS OF WATER:  busPIRone (BUSPAR) pantoprazole (PROTONIX) rosuvastatin (CRESTOR)  Use inhalers on the day of surgery and bring to the hospital.  No Alcohol for 24 hours before or after surgery.  No Smoking including e-cigarettes for 24 hours before surgery.  No chewable tobacco products for at least 6 hours before surgery.  No nicotine patches on the day of surgery.  Do not use any "recreational" drugs for at least a week (preferably 2 weeks) before your surgery.   Please be advised that the combination of cocaine and anesthesia may have negative outcomes, up to and including death. If you test positive for cocaine, your surgery will be cancelled.  On the morning of surgery brush your teeth with toothpaste and water, you may rinse your mouth with mouthwash if you wish. Do not swallow any toothpaste or mouthwash.  Use CHG Soap or wipes as directed on instruction sheet.-provided for you  Do not wear jewelry, make-up, hairpins, clips or nail polish.  For welded (permanent) jewelry: bracelets, anklets, waist bands, etc.  Please have this removed prior to surgery.  If it is not removed, there is a chance that hospital personnel will need to cut it off on the day of surgery.  Do not wear lotions, powders, or perfumes.   Do not shave body hair from the neck down 48 hours before surgery.  Contact lenses, hearing aids and dentures may not be worn into surgery.  Do not bring valuables to the hospital. [facility] is not responsible for any missing/lost belongings or valuables.   Notify your doctor if there is any change in your medical condition (cold, fever, infection).  Wear comfortable clothing (specific to your surgery type) to the hospital.  After surgery, you can help prevent lung complications by doing breathing exercises.  Take deep breaths and cough every 1-2 hours. Your doctor may order a device called an Incentive Spirometer to help you take deep breaths.  If you are being discharged the day of surgery, you will  not be allowed to drive home. You will need a responsible individual to drive you home and stay with you for 24 hours after surgery.   Please call the Pre-admissions Testing Dept. at [phone] if you have any questions about these instructions.  Surgery Visitation Policy:  Patients having surgery or a procedure may have two visitors.  Children under the age of 43 must have an adult with them who is not the  patient.     Preparing for Surgery with CHLORHEXIDINE GLUCONATE (CHG) Soap  Chlorhexidine Gluconate (CHG) Soap  o An antiseptic cleaner that kills germs and bonds with the skin to continue killing germs even after washing  o Used for showering the night before surgery and morning of surgery  Before surgery, you can play an important role by reducing the number of germs on your skin.  CHG (Chlorhexidine gluconate) soap is an antiseptic cleanser which kills germs and bonds with the skin to continue killing germs even after washing.  Please do not use if you have an allergy to CHG or antibacterial soaps. If your skin becomes reddened/irritated stop using the CHG.  1. Shower the NIGHT BEFORE SURGERY and the MORNING OF SURGERY with CHG soap.  2. If you choose to wash your hair, wash your hair first as usual with your normal shampoo.  3. After shampooing, rinse your hair and body thoroughly to remove the shampoo.  4. Use CHG as you would any other liquid soap. You can apply CHG directly to the skin and wash gently with a scrungie or a clean washcloth.  5. Apply the CHG soap to your body only from the neck down. Do not use on open wounds or open sores. Avoid contact with your eyes, ears, mouth, and genitals (private parts). Wash face and genitals (private parts) with your normal soap.  6. Wash thoroughly, paying special attention to the area where your surgery will be performed.  7. Thoroughly rinse your body with warm water.  8. Do not shower/wash with your normal soap after using and rinsing off the CHG soap.  9. Pat yourself dry with a clean towel.  10. Wear clean pajamas to bed the night before surgery.  12. Place clean sheets on your bed the night of your first shower and do not sleep with pets.  13. Shower again with the CHG soap on the day of surgery prior to arriving at the hospital.  14. Do not apply any deodorants/lotions/powders.  15. Please wear clean clothes to the  hospital.

## 2023-01-13 ENCOUNTER — Telehealth: Payer: Self-pay

## 2023-01-13 NOTE — Pre-Procedure Instructions (Signed)
Cardiac clearance sent to Dr Herbie Baltimore as requested by anesthesia Suzan Slick).

## 2023-01-13 NOTE — Telephone Encounter (Signed)
   Pre-operative Risk Assessment    Patient Name: Brandy Robinson  DOB: 19-May-1965 MRN: [id number]   Date of last office visit: 11/03/2022 Date of next office visit: 01/26/2023   Request for Surgical Clearance    Procedure:   Robotic Assisted Cholecystectomy  Date of Surgery:  Clearance 01/19/23                                Surgeon:  Dr. Aleen Campi Surgeon's Group or Practice Name:  [facility] Phone number:  [phone] Fax number:  [phone]   Type of Clearance Requested:   - Medical    Type of Anesthesia:  Not Indicated   Additional requests/questions:    Garrel Ridgel   01/13/2023, 2:19 PM

## 2023-01-13 NOTE — Telephone Encounter (Signed)
Left message to call back to schedule tele preop appt . Pt will need to be added on per pre op APP Reather Littler, NP 01/16/23.

## 2023-01-13 NOTE — Telephone Encounter (Signed)
   Name: Brandy Robinson  DOB: [birth year]  MRN: [id number]  Primary Cardiologist: None  Preoperative team, please contact this patient and set up a phone call appointment for further preoperative risk assessment. Please obtain consent and complete medication review. Thank you for your help.  I confirm that guidance regarding antiplatelet and oral anticoagulation therapy has been completed and, if necessary, noted below. None requested.   I also confirmed the patient resides in the state of [place]. As per [facility] Medical Board telemedicine laws, the patient must reside in the state in which the provider is licensed.  Rip Harbour, NP 01/13/2023, 4:15 PM [facility]

## 2023-01-16 ENCOUNTER — Telehealth: Payer: Self-pay | Admitting: Cardiology

## 2023-01-16 ENCOUNTER — Telehealth: Payer: Self-pay | Admitting: *Deleted

## 2023-01-16 ENCOUNTER — Telehealth (INDEPENDENT_AMBULATORY_CARE_PROVIDER_SITE_OTHER): Payer: Medicaid Other

## 2023-01-16 DIAGNOSIS — Z01810 Encounter for preprocedural cardiovascular examination: Secondary | ICD-10-CM

## 2023-01-16 NOTE — Progress Notes (Signed)
Cardiac clearance in Epic (01-16-23) from Jari Favre PA- Acceptable risk

## 2023-01-16 NOTE — Telephone Encounter (Signed)
Patient returned Pre-op call to schedule tele-visit.

## 2023-01-16 NOTE — Pre-Procedure Instructions (Deleted)
Cardiac Clearance in Epic (01-16-23) by Jari Favre PA- Acceptable Risk

## 2023-01-16 NOTE — Telephone Encounter (Signed)
FAX # [phone]

## 2023-01-16 NOTE — Telephone Encounter (Signed)
Pt has been added on per Reather Littler, NP today due to procedure 01/19/23. Med rec and consent are done.

## 2023-01-16 NOTE — Progress Notes (Signed)
Virtual Visit via Telephone Note   Because of Brandy Robinson's co-morbid illnesses, she is at least at moderate risk for complications without adequate follow up.  This format is felt to be most appropriate for this patient at this time.  The patient did not have access to video technology/had technical difficulties with video requiring transitioning to audio format only (telephone).  All issues noted in this document were discussed and addressed.  No physical exam could be performed with this format.  Please refer to the patient's chart for her consent to telehealth for [facility].  Evaluation Performed:  Preoperative cardiovascular risk assessment _____________   Date:  01/16/2023   Patient ID:  Brandy Robinson, DOB [birth year], MRN [id number] Patient Location:  Home Provider location:   Office  Primary Care Provider:  Armando Gang, FNP Primary Cardiologist:  None  Chief Complaint / Patient Profile   57 y.o. y/o female with a h/o smoking, hypertension, and diabetes who is pending robotic assisted cholecystectomy and presents today for telephonic preoperative cardiovascular risk assessment.  History of Present Illness    Brandy Robinson is a 57 y.o. female who presents via audio/video conferencing for a telehealth visit today.  Pt was last seen in cardiology clinic on 11/03/2022 by Dr. Herbie Baltimore.  At that time Brandy Robinson was doing well.  The patient is now pending procedure as outlined above. Since her last visit, she tells me that her heart has not bothered her.  She did have severe pain from her gallbladder that she thought was a heart attack.  She called EMS and an EKG was done which was negative as well as some blood work.  No further symptoms of chest pains or shortness of breath.  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  She also has a big chocolate lab that she plays with regularly named whiskey.  For  this reason, she has surpassed 4 METS on the DASI.  She is not on any medications that need to be held from a cardiovascular standpoint.  Past Medical History    Past Medical History:  Diagnosis Date   Anxiety    Bipolar disorder (HCC)    COPD (chronic obstructive pulmonary disease) (HCC)    Depression    Diabetes mellitus without complication (HCC)    Disc disorder    Dyspareunia    Endometriosis    GERD (gastroesophageal reflux disease)    Insomnia    Lichen    Surgical menopause    Vaginal atrophy    Wears glasses    Past Surgical History:  Procedure Laterality Date   ABDOMINAL HYSTERECTOMY  01/17/1993   tah-bso   ARM WOUND REPAIR / CLOSURE  01/18/2011   cellulitis rt arm-i/d   KNEE BURSECTOMY Left 09/24/2013   Procedure: IRRIGATION AND DEBRIDEMENT OF LEFT KNEE ;  Surgeon: Sheral Apley, MD;  Location: [facility];  Service: Orthopedics;  Laterality: Left;   LAPAROTOMY N/A 12/30/2014   Procedure: EXPLORATORY LAPAROTOMY-graham patch of peptic ulcer;  Surgeon: Ricarda Frame, MD;  Location: [facility];  Service: General;  Laterality: N/A;   Transthoracic echo     TRANSTHORACIC ECHOCARDIOGRAM  03/25/2017   01/01/2015 [facility]): EF 50 to 55%.  Low normal range.  No RWMA.  Mild MR.;; 03/2017:: 03/22/2017 [facility]): EF 45 to 50%.  Mildly reduced.  No RWMA.  GR 1 DD.    Allergies  Allergies  Allergen Reactions   Hydrocodone Itching   Aspirin Other (See Comments)    Reaction:  GI upset     Home Medications    Prior to Admission medications   Medication Sig Start Date End Date Taking? Authorizing Provider  ACCU-CHEK GUIDE test strip USE TO CHECK BLOOD SUGAR TWICE DAILY BEFORE MEALS 09/27/22   [provider]  Blood Glucose Monitoring Suppl (ACCU-CHEK GUIDE) w/Device KIT USE AS DIRECTED TO CHECK BLOOD GLUCOSE 09/29/22   [provider]  busPIRone (BUSPAR) 15 MG tablet Take 15 mg by mouth 2 (two) times daily. 10/25/22   [provider]   clobetasol ointment (TEMOVATE) 0.05 % Apply 1 Application topically 2 (two) times a week. Patient not taking: Reported on 01/16/2023 04/21/22 06/15/23  Linzie Collin, MD  clonazePAM (KLONOPIN) 0.5 MG tablet Take 0.5 mg by mouth at bedtime. 10/21/22   [provider]  DULoxetine (CYMBALTA) 60 MG capsule Take 120 mg by mouth at bedtime. 10/24/22   [provider]  estradiol (ESTRACE) 1 MG tablet Take 1.5 tablets (1.5 mg total) by mouth daily. 10/31/22 10/31/23  Linzie Collin, MD  JARDIANCE 25 MG TABS tablet Take 25 mg by mouth daily. 10/22/22   [provider]  lisinopril (ZESTRIL) 20 MG tablet Take 20 mg by mouth daily. 08/22/22   [provider]  ondansetron (ZOFRAN-ODT) 4 MG disintegrating tablet Take 4 mg by mouth 2 (two) times daily as needed for vomiting or nausea. Patient not taking: Reported on 01/16/2023 10/17/22   [provider]  pantoprazole (PROTONIX) 40 MG tablet Take 1 tablet (40 mg total) by mouth daily. 12/07/22 12/02/23  [name], PA-C  rosuvastatin (CRESTOR) 20 MG tablet Take 20 mg by mouth daily. 10/24/22   [provider]  sucralfate (CARAFATE) 1 g tablet Take 1 tablet (1 g total) by mouth 3 (three) times daily before meals. Patient not taking: Reported on 01/16/2023 12/07/22 03/07/23  [name], PA-C  traMADol (ULTRAM) 50 MG tablet Take 50 mg by mouth 2 (two) times daily as needed for moderate pain (pain score 4-6). 10/29/22   [provider]  VENTOLIN HFA 108 (90 Base) MCG/ACT inhaler Inhale 1-2 puffs into the lungs every 6 (six) hours as needed for wheezing or shortness of breath. 10/17/22   [provider]    Physical Exam    Vital Signs:  DAWNESHA MENKE does not have vital signs available for review today.  Given telephonic nature of communication, physical exam is limited. AAOx3. NAD. Normal affect.  Speech and respirations are unlabored.  Accessory Clinical Findings     None  Assessment & Plan    1.  Preoperative Cardiovascular Risk Assessment:  Brandy Robinson's perioperative risk of a major cardiac event is 0.9% according to the Revised Cardiac Risk Index (RCRI).  Therefore, she is at low risk for perioperative complications.   Her functional capacity is good at 5.38 METs according to the [place] Activity Status Index (DASI). Recommendations: According to ACC/AHA guidelines, no further cardiovascular testing needed.  The patient may proceed to surgery at acceptable risk.    Time:   Today, I have spent 10 minutes with the patient with telehealth technology discussing medical history, symptoms, and management plan.     Sharlene Dory, PA-C  01/16/2023, 3:23 PM

## 2023-01-16 NOTE — Telephone Encounter (Signed)
Pt has been added on per Reather Littler, NP today due to procedure 01/19/23. Med rec and consent are done.     Patient Consent for Virtual Visit        Brandy Robinson has provided verbal consent on 01/16/2023 for a virtual visit (video or telephone).   CONSENT FOR VIRTUAL VISIT FOR:  Brandy Robinson  By participating in this virtual visit I agree to the following:  I hereby voluntarily request, consent and authorize [facility] and its employed or contracted physicians, physician assistants, nurse practitioners or other licensed health care professionals (the Practitioner), to provide me with telemedicine health care services (the "Services") as deemed necessary by the treating Practitioner. I acknowledge and consent to receive the Services by the Practitioner via telemedicine. I understand that the telemedicine visit will involve communicating with the Practitioner through live audiovisual communication technology and the disclosure of certain medical information by electronic transmission. I acknowledge that I have been given the opportunity to request an in-person assessment or other available alternative prior to the telemedicine visit and am voluntarily participating in the telemedicine visit.  I understand that I have the right to withhold or withdraw my consent to the use of telemedicine in the course of my care at any time, without affecting my right to future care or treatment, and that the Practitioner or I may terminate the telemedicine visit at any time. I understand that I have the right to inspect all information obtained and/or recorded in the course of the telemedicine visit and may receive copies of available information for a reasonable fee.  I understand that some of the potential risks of receiving the Services via telemedicine include:  Delay or interruption in medical evaluation due to technological equipment failure or disruption; Information transmitted may not be  sufficient (e.g. poor resolution of images) to allow for appropriate medical decision making by the Practitioner; and/or  In rare instances, security protocols could fail, causing a breach of personal health information.  Furthermore, I acknowledge that it is my responsibility to provide information about my medical history, conditions and care that is complete and accurate to the best of my ability. I acknowledge that Practitioner's advice, recommendations, and/or decision may be based on factors not within their control, such as incomplete or inaccurate data provided by me or distortions of diagnostic images or specimens that may result from electronic transmissions. I understand that the practice of medicine is not an exact science and that Practitioner makes no warranties or guarantees regarding treatment outcomes. I acknowledge that a copy of this consent can be made available to me via my patient portal [facility] MyChart), or I can request a printed copy by calling the office of [facility].    I understand that my insurance will be billed for this visit.   I have read or had this consent read to me. I understand the contents of this consent, which adequately explains the benefits and risks of the Services being provided via telemedicine.  I have been provided ample opportunity to ask questions regarding this consent and the Services and have had my questions answered to my satisfaction. I give my informed consent for the services to be provided through the use of telemedicine in my medical care

## 2023-01-18 MED ORDER — BUPIVACAINE LIPOSOME 1.3 % IJ SUSP: 10.0000 mL | Freq: Once | INTRAMUSCULAR | Status: DC

## 2023-01-18 MED ORDER — INDOCYANINE GREEN 25 MG IV SOLR
1.2500 mg | INTRAVENOUS | Status: AC
  Administered 2023-01-19: 1.25 mg via INTRAVENOUS

## 2023-01-18 MED ORDER — GABAPENTIN 300 MG PO CAPS
300.0000 mg | ORAL_CAPSULE | ORAL | Status: AC
  Administered 2023-01-19: 300 mg via ORAL

## 2023-01-18 MED ORDER — CEFAZOLIN SODIUM-DEXTROSE 2-4 GM/100ML-% IV SOLN
2.0000 g | INTRAVENOUS | Status: AC
  Administered 2023-01-19: 2 g via INTRAVENOUS

## 2023-01-18 MED ORDER — CHLORHEXIDINE GLUCONATE CLOTH 2 % EX PADS: 6.0000 | MEDICATED_PAD | Freq: Once | CUTANEOUS | Status: DC

## 2023-01-18 MED ORDER — ACETAMINOPHEN 500 MG PO TABS
1000.0000 mg | ORAL_TABLET | ORAL | Status: AC
  Administered 2023-01-19: 1000 mg via ORAL

## 2023-01-18 MED ORDER — ORAL CARE MOUTH RINSE: 15.0000 mL | Freq: Once | OROMUCOSAL | Status: AC

## 2023-01-18 MED ORDER — CHLORHEXIDINE GLUCONATE 0.12 % MT SOLN
15.0000 mL | Freq: Once | OROMUCOSAL | Status: AC
  Administered 2023-01-19: 15 mL via OROMUCOSAL

## 2023-01-18 MED ORDER — CHLORHEXIDINE GLUCONATE CLOTH 2 % EX PADS
6.0000 | MEDICATED_PAD | Freq: Once | CUTANEOUS | Status: AC
  Administered 2023-01-19: 6 via TOPICAL

## 2023-01-18 MED ORDER — SODIUM CHLORIDE 0.9 % IV SOLN: INTRAVENOUS | Status: DC

## 2023-01-19 ENCOUNTER — Ambulatory Visit: Payer: Medicaid Other | Admitting: Urgent Care

## 2023-01-19 ENCOUNTER — Encounter: Payer: Self-pay | Admitting: Surgery

## 2023-01-19 ENCOUNTER — Ambulatory Visit: Payer: Medicaid Other | Admitting: Anesthesiology

## 2023-01-19 ENCOUNTER — Other Ambulatory Visit: Payer: Self-pay

## 2023-01-19 ENCOUNTER — Encounter
Admission: RE | Disposition: A | Discharge status: Home / Self Care | Payer: Self-pay | Source: Home / Self Care | Attending: Surgery

## 2023-01-19 ENCOUNTER — Ambulatory Visit
Admission: RE | Admit: 2023-01-19 | Discharge: 2023-01-19 | Disposition: A | Discharge status: Home / Self Care | Payer: Medicaid Other | Attending: Surgery | Admitting: Surgery

## 2023-01-19 DIAGNOSIS — Z8711 Personal history of peptic ulcer disease: Secondary | ICD-10-CM | POA: Diagnosis not present

## 2023-01-19 DIAGNOSIS — R1011 Right upper quadrant pain: Secondary | ICD-10-CM | POA: Diagnosis present

## 2023-01-19 DIAGNOSIS — I10 Essential (primary) hypertension: Secondary | ICD-10-CM | POA: Insufficient documentation

## 2023-01-19 DIAGNOSIS — E119 Type 2 diabetes mellitus without complications: Secondary | ICD-10-CM | POA: Diagnosis not present

## 2023-01-19 DIAGNOSIS — K8010 Calculus of gallbladder with chronic cholecystitis without obstruction: Secondary | ICD-10-CM | POA: Insufficient documentation

## 2023-01-19 DIAGNOSIS — F17200 Nicotine dependence, unspecified, uncomplicated: Secondary | ICD-10-CM | POA: Diagnosis not present

## 2023-01-19 DIAGNOSIS — K8020 Calculus of gallbladder without cholecystitis without obstruction: Secondary | ICD-10-CM

## 2023-01-19 DIAGNOSIS — Z01812 Encounter for preprocedural laboratory examination: Secondary | ICD-10-CM

## 2023-01-19 DIAGNOSIS — Z7984 Long term (current) use of oral hypoglycemic drugs: Secondary | ICD-10-CM | POA: Insufficient documentation

## 2023-01-19 DIAGNOSIS — E1169 Type 2 diabetes mellitus with other specified complication: Secondary | ICD-10-CM

## 2023-01-19 DIAGNOSIS — J449 Chronic obstructive pulmonary disease, unspecified: Secondary | ICD-10-CM | POA: Insufficient documentation

## 2023-01-19 DIAGNOSIS — K219 Gastro-esophageal reflux disease without esophagitis: Secondary | ICD-10-CM | POA: Insufficient documentation

## 2023-01-19 HISTORY — DX: Type 2 diabetes mellitus without complications: E11.9

## 2023-01-19 HISTORY — DX: Chronic obstructive pulmonary disease, unspecified: J44.9

## 2023-01-19 LAB — GLUCOSE, CAPILLARY
Glucose-Capillary: 140 mg/dL — ABNORMAL HIGH (ref 70–99)
Glucose-Capillary: 205 mg/dL — ABNORMAL HIGH (ref 70–99)

## 2023-01-19 SURGERY — CHOLECYSTECTOMY, ROBOT-ASSISTED, LAPAROSCOPIC
Anesthesia: General

## 2023-01-19 MED ORDER — KETOROLAC TROMETHAMINE 30 MG/ML IJ SOLN
INTRAMUSCULAR | Status: AC
  Filled 2023-01-19: qty 1

## 2023-01-19 MED ORDER — CHLORHEXIDINE GLUCONATE 0.12 % MT SOLN
OROMUCOSAL | Status: AC
  Filled 2023-01-19: qty 15

## 2023-01-19 MED ORDER — GABAPENTIN 300 MG PO CAPS
ORAL_CAPSULE | ORAL | Status: AC
  Filled 2023-01-19: qty 1

## 2023-01-19 MED ORDER — FENTANYL CITRATE (PF) 100 MCG/2ML IJ SOLN
INTRAMUSCULAR | Status: AC
  Filled 2023-01-19: qty 2

## 2023-01-19 MED ORDER — PHENYLEPHRINE 80 MCG/ML (10ML) SYRINGE FOR IV PUSH (FOR BLOOD PRESSURE SUPPORT)
PREFILLED_SYRINGE | INTRAVENOUS | Status: DC | PRN
  Administered 2023-01-19 (×2): 80 ug via INTRAVENOUS

## 2023-01-19 MED ORDER — BUPIVACAINE-EPINEPHRINE (PF) 0.25% -1:200000 IJ SOLN
INTRAMUSCULAR | Status: AC
  Filled 2023-01-19: qty 30

## 2023-01-19 MED ORDER — HYDROMORPHONE HCL 1 MG/ML IJ SOLN
INTRAMUSCULAR | Status: AC
  Filled 2023-01-19: qty 1

## 2023-01-19 MED ORDER — ONDANSETRON HCL 4 MG/2ML IJ SOLN
INTRAMUSCULAR | Status: AC
  Filled 2023-01-19: qty 2

## 2023-01-19 MED ORDER — MIDAZOLAM HCL 2 MG/2ML IJ SOLN
INTRAMUSCULAR | Status: DC | PRN
  Administered 2023-01-19: 2 mg via INTRAVENOUS

## 2023-01-19 MED ORDER — OXYCODONE HCL 5 MG PO TABS: 5.0000 mg | ORAL_TABLET | ORAL | 0 refills | Status: DC | PRN

## 2023-01-19 MED ORDER — EPHEDRINE SULFATE-NACL 50-0.9 MG/10ML-% IV SOSY
PREFILLED_SYRINGE | INTRAVENOUS | Status: DC | PRN
  Administered 2023-01-19: 10 mg via INTRAVENOUS

## 2023-01-19 MED ORDER — DEXAMETHASONE SODIUM PHOSPHATE 10 MG/ML IJ SOLN
INTRAMUSCULAR | Status: AC
  Filled 2023-01-19: qty 1

## 2023-01-19 MED ORDER — LIDOCAINE HCL (CARDIAC) PF 100 MG/5ML IV SOSY
PREFILLED_SYRINGE | INTRAVENOUS | Status: DC | PRN
  Administered 2023-01-19: 80 mg via INTRAVENOUS

## 2023-01-19 MED ORDER — FENTANYL CITRATE (PF) 100 MCG/2ML IJ SOLN
INTRAMUSCULAR | Status: DC | PRN
  Administered 2023-01-19 (×2): 50 ug via INTRAVENOUS

## 2023-01-19 MED ORDER — VASOPRESSIN 20 UNIT/ML IV SOLN
INTRAVENOUS | Status: AC
  Filled 2023-01-19: qty 1

## 2023-01-19 MED ORDER — SUGAMMADEX SODIUM 500 MG/5ML IV SOLN
INTRAVENOUS | Status: DC | PRN
  Administered 2023-01-19: 141.6 mg via INTRAVENOUS

## 2023-01-19 MED ORDER — EPHEDRINE 5 MG/ML INJ
INTRAVENOUS | Status: AC
  Filled 2023-01-19: qty 5

## 2023-01-19 MED ORDER — ROCURONIUM BROMIDE 10 MG/ML (PF) SYRINGE
PREFILLED_SYRINGE | INTRAVENOUS | Status: AC
  Filled 2023-01-19: qty 10

## 2023-01-19 MED ORDER — BUPIVACAINE-EPINEPHRINE (PF) 0.25% -1:200000 IJ SOLN
INTRAMUSCULAR | Status: DC | PRN
  Administered 2023-01-19: 30 mL

## 2023-01-19 MED ORDER — HYDROMORPHONE HCL 1 MG/ML IJ SOLN
0.2500 mg | INTRAMUSCULAR | Status: DC | PRN
  Administered 2023-01-19 (×4): 0.5 mg via INTRAVENOUS

## 2023-01-19 MED ORDER — LACTATED RINGERS IV SOLN: INTRAVENOUS | Status: DC | PRN

## 2023-01-19 MED ORDER — ACETAMINOPHEN 500 MG PO TABS
ORAL_TABLET | ORAL | Status: AC
  Filled 2023-01-19: qty 2

## 2023-01-19 MED ORDER — OXYCODONE HCL 5 MG PO TABS
ORAL_TABLET | ORAL | Status: AC
  Filled 2023-01-19: qty 1

## 2023-01-19 MED ORDER — PROPOFOL 1000 MG/100ML IV EMUL
INTRAVENOUS | Status: AC
  Filled 2023-01-19: qty 100

## 2023-01-19 MED ORDER — PROPOFOL 10 MG/ML IV BOLUS
INTRAVENOUS | Status: AC
  Filled 2023-01-19: qty 20

## 2023-01-19 MED ORDER — ROCURONIUM BROMIDE 100 MG/10ML IV SOLN
INTRAVENOUS | Status: DC | PRN
  Administered 2023-01-19: 20 mg via INTRAVENOUS
  Administered 2023-01-19: 40 mg via INTRAVENOUS

## 2023-01-19 MED ORDER — VASOPRESSIN 20 UNIT/ML IV SOLN
INTRAVENOUS | Status: DC | PRN
  Administered 2023-01-19: .5 [IU] via INTRAVENOUS

## 2023-01-19 MED ORDER — MIDAZOLAM HCL 2 MG/2ML IJ SOLN
INTRAMUSCULAR | Status: AC
  Filled 2023-01-19: qty 2

## 2023-01-19 MED ORDER — BUPIVACAINE LIPOSOME 1.3 % IJ SUSP
INTRAMUSCULAR | Status: DC | PRN
  Administered 2023-01-19: 10 mL

## 2023-01-19 MED ORDER — ACETAMINOPHEN 500 MG PO TABS: 1000.0000 mg | ORAL_TABLET | Freq: Four times a day (QID) | ORAL | Status: DC | PRN

## 2023-01-19 MED ORDER — CEFAZOLIN SODIUM-DEXTROSE 2-4 GM/100ML-% IV SOLN
INTRAVENOUS | Status: AC
  Filled 2023-01-19: qty 100

## 2023-01-19 MED ORDER — PROPOFOL 10 MG/ML IV BOLUS
INTRAVENOUS | Status: DC | PRN
  Administered 2023-01-19: 150 mg via INTRAVENOUS

## 2023-01-19 MED ORDER — INDOCYANINE GREEN 25 MG IV SOLR
INTRAVENOUS | Status: AC
  Filled 2023-01-19: qty 10

## 2023-01-19 MED ORDER — LIDOCAINE HCL (PF) 2 % IJ SOLN
INTRAMUSCULAR | Status: AC
  Filled 2023-01-19: qty 5

## 2023-01-19 MED ORDER — OXYCODONE HCL 5 MG/5ML PO SOLN: 5.0000 mg | Freq: Once | ORAL | Status: AC | PRN

## 2023-01-19 MED ORDER — OXYCODONE HCL 5 MG PO TABS
5.0000 mg | ORAL_TABLET | Freq: Once | ORAL | Status: AC | PRN
  Administered 2023-01-19: 5 mg via ORAL

## 2023-01-19 MED ORDER — BUPIVACAINE LIPOSOME 1.3 % IJ SUSP
INTRAMUSCULAR | Status: AC
  Filled 2023-01-19: qty 10

## 2023-01-19 MED ORDER — DEXAMETHASONE SODIUM PHOSPHATE 10 MG/ML IJ SOLN
INTRAMUSCULAR | Status: DC | PRN
  Administered 2023-01-19: 10 mg via INTRAVENOUS

## 2023-01-19 MED ORDER — ONDANSETRON HCL 4 MG/2ML IJ SOLN
INTRAMUSCULAR | Status: DC | PRN
  Administered 2023-01-19: 4 mg via INTRAVENOUS

## 2023-01-19 SURGICAL SUPPLY — 45 items
BAG PRESSURE INF REUSE 1000 (BAG) IMPLANT
CANNULA CAP OBTURATR AIRSEAL 8 (CAP) IMPLANT
CAUTERY HOOK MNPLR 1.6 DVNC XI (INSTRUMENTS) ×1 IMPLANT
CLIP LIGATING HEMO O LOK GREEN (MISCELLANEOUS) ×1 IMPLANT
DERMABOND ADVANCED .7 DNX12 (GAUZE/BANDAGES/DRESSINGS) ×1 IMPLANT
DRAPE ARM DVNC X/XI (DISPOSABLE) ×4 IMPLANT
DRAPE COLUMN DVNC XI (DISPOSABLE) ×1 IMPLANT
ELECT CAUTERY BLADE TIP 2.5 (TIP) ×1
ELECT REM PT RETURN 9FT ADLT (ELECTROSURGICAL) ×1
ELECTRODE CAUTERY BLDE TIP 2.5 (TIP) ×1 IMPLANT
ELECTRODE REM PT RTRN 9FT ADLT (ELECTROSURGICAL) ×1 IMPLANT
FORCEPS BPLR R/ABLATION 8 DVNC (INSTRUMENTS) ×1 IMPLANT
FORCEPS PROGRASP DVNC XI (FORCEP) ×1 IMPLANT
GLOVE SURG SYN 7.0 (GLOVE) ×3
GLOVE SURG SYN 7.0 PF PI (GLOVE) ×2 IMPLANT
GLOVE SURG SYN 7.5 E (GLOVE) ×3
GLOVE SURG SYN 7.5 PF PI (GLOVE) ×2 IMPLANT
GOWN STRL REUS W/ TWL LRG LVL3 (GOWN DISPOSABLE) ×4 IMPLANT
IRRIGATOR SUCT 8 DISP DVNC XI (IRRIGATION / IRRIGATOR) IMPLANT
IV NS 1000ML BAXH (IV SOLUTION) IMPLANT
KIT PINK PAD W/HEAD ARE REST (MISCELLANEOUS) ×1
KIT PINK PAD W/HEAD ARM REST (MISCELLANEOUS) ×1 IMPLANT
L-HOOK LAP DISP 36CM (ELECTROSURGICAL) ×1
LABEL OR SOLS (LABEL) ×1 IMPLANT
LHOOK LAP DISP 36CM (ELECTROSURGICAL) IMPLANT
MANIFOLD NEPTUNE II (INSTRUMENTS) ×1 IMPLANT
NDL HYPO 22X1.5 SAFETY MO (MISCELLANEOUS) ×1 IMPLANT
NEEDLE HYPO 22X1.5 SAFETY MO (MISCELLANEOUS) ×1
NS IRRIG 500ML POUR BTL (IV SOLUTION) ×1 IMPLANT
OBTURATOR OPTICAL STND 8 DVNC (TROCAR) ×1
OBTURATOR OPTICALSTD 8 DVNC (TROCAR) ×1 IMPLANT
PACK LAP CHOLECYSTECTOMY (MISCELLANEOUS) ×1 IMPLANT
SEAL UNIV 5-12 XI (MISCELLANEOUS) ×4 IMPLANT
SET TUBE FILTERED XL AIRSEAL (SET/KITS/TRAYS/PACK) IMPLANT
SET TUBE SMOKE EVAC HIGH FLOW (TUBING) ×1 IMPLANT
SOL ELECTROSURG ANTI STICK (MISCELLANEOUS) ×1
SOLUTION ELECTROSURG ANTI STCK (MISCELLANEOUS) ×1 IMPLANT
SPIKE FLUID TRANSFER (MISCELLANEOUS) ×1 IMPLANT
SUT MNCRL AB 4-0 PS2 18 (SUTURE) ×1 IMPLANT
SUT SILK 3 0 SH 30 (SUTURE) IMPLANT
SUT VIC AB 3-0 SH 27X BRD (SUTURE) IMPLANT
SUT VICRYL 0 UR6 27IN ABS (SUTURE) ×2 IMPLANT
SYS BAG RETRIEVAL 10MM (BASKET) ×1
SYSTEM BAG RETRIEVAL 10MM (BASKET) ×1 IMPLANT
WATER STERILE IRR 500ML POUR (IV SOLUTION) ×1 IMPLANT

## 2023-01-19 NOTE — Anesthesia Postprocedure Evaluation (Signed)
 Anesthesia Post Note  Patient: Brandy Robinson  Procedure(s) Performed: XI ROBOTIC ASSISTED LAPAROSCOPIC CHOLECYSTECTOMY INDOCYANINE GREEN  FLUORESCENCE IMAGING (ICG)  Patient location during evaluation: PACU Anesthesia Type: General Level of consciousness: awake and alert Pain management: pain level controlled Vital Signs Assessment: post-procedure vital signs reviewed and stable Respiratory status: spontaneous breathing, nonlabored ventilation, respiratory function stable and patient connected to nasal cannula oxygen Cardiovascular status: blood pressure returned to baseline and stable Postop Assessment: no apparent nausea or vomiting Anesthetic complications: no   No notable events documented.   Last Vitals:  Vitals:   01/19/23 1100 01/19/23 1115  BP: (!) 118/53   Pulse: 97 94  Resp: 12 14  Temp:    SpO2: 94% 94%    Last Pain:  Vitals:   01/19/23 1100  TempSrc:   PainSc: 3                  Brandy Robinson

## 2023-01-19 NOTE — Anesthesia Procedure Notes (Signed)
 Procedure Name: Intubation Date/Time: 01/19/2023 7:37 AM  Performed by: Elly Pfeiffer, CRNAPre-anesthesia Checklist: Patient identified, Emergency Drugs available, Suction available and Patient being monitored Patient Re-evaluated:Patient Re-evaluated prior to induction Oxygen Delivery Method: Circle system utilized Preoxygenation: Pre-oxygenation with 100% oxygen Induction Type: IV induction Ventilation: Mask ventilation without difficulty Laryngoscope Size: Mac and 4 Grade View: Grade I Tube type: Oral Tube size: 7.0 mm Number of attempts: 1 Airway Equipment and Method: Stylet and Oral airway Placement Confirmation: ETT inserted through vocal cords under direct vision, positive ETCO2 and breath sounds checked- equal and bilateral Secured at: 21 cm Tube secured with: Tape Dental Injury: Teeth and Oropharynx as per pre-operative assessment  Comments: Cords clear. Ca

## 2023-01-19 NOTE — Anesthesia Preprocedure Evaluation (Signed)
 Anesthesia Evaluation  Patient identified by MRN, date of birth, ID band Patient awake    Reviewed: Allergy & Precautions, NPO status , Patient's Chart, lab work & pertinent test results  History of Anesthesia Complications Negative for: history of anesthetic complications  Airway Mallampati: III  TM Distance: >3 FB Neck ROM: full    Dental  (+) Upper Dentures   Pulmonary COPD,  COPD inhaler, Patient abstained from smoking., former smoker   Pulmonary exam normal        Cardiovascular hypertension, On Medications Normal cardiovascular exam     Neuro/Psych  PSYCHIATRIC DISORDERS Anxiety Depression Bipolar Disorder    Neuromuscular disease    GI/Hepatic Neg liver ROS,GERD  Medicated and Controlled,,  Endo/Other  negative endocrine ROSdiabetes, Type 2, Oral Hypoglycemic Agents    Renal/GU      Musculoskeletal   Abdominal   Peds  Hematology negative hematology ROS (+)   Anesthesia Other Findings Past Medical History: No date: Anxiety No date: Bipolar disorder (HCC) No date: COPD (chronic obstructive pulmonary disease) (HCC) No date: Depression No date: Diabetes mellitus without complication (HCC) No date: Disc disorder No date: Dyspareunia No date: Endometriosis No date: GERD (gastroesophageal reflux disease) No date: Insomnia No date: Lichen No date: Surgical menopause No date: Vaginal atrophy No date: Wears glasses  Past Surgical History: 01/17/1993: ABDOMINAL HYSTERECTOMY     Comment:  tah-bso 01/18/2011: ARM WOUND REPAIR / CLOSURE     Comment:  cellulitis rt arm-i/d 09/24/2013: KNEE BURSECTOMY; Left     Comment:  Procedure: IRRIGATION AND DEBRIDEMENT OF LEFT KNEE ;                Surgeon: Evalene JONETTA Chancy, MD;  Location:                SURGERY CENTER;  Service: Orthopedics;  Laterality: Left; 12/30/2014: LAPAROTOMY; N/A     Comment:  Procedure: EXPLORATORY LAPAROTOMY-graham patch of peptic               ulcer;  Surgeon: Carlin Pastel, MD;  Location: [facility]               ORS;  Service: General;  Laterality: N/A; No date: Transthoracic echo 03/25/2017: TRANSTHORACIC ECHOCARDIOGRAM     Comment:  01/01/2015 [facility]): EF 50 to 55%.  Low normal range.  No               RWMA.  Mild MR.;; 03/2017:: 03/22/2017 [facility]): EF 45 to 50%.              Mildly reduced.  No RWMA.  GR 1 DD.  BMI    Body Mass Index: 27.63 kg/m      Reproductive/Obstetrics negative OB ROS                             Anesthesia Physical Anesthesia Plan  ASA: 3  Anesthesia Plan: General ETT   Post-op Pain Management: Toradol  IV (intra-op)*, Ofirmev  IV (intra-op)*, Ketamine IV*, Dilaudid  IV and Precedex    Induction: Intravenous  PONV Risk Score and Plan: 3 and Ondansetron , Dexamethasone , Midazolam  and Treatment may vary due to age or medical condition  Airway Management Planned: Oral ETT  Additional Equipment:   Intra-op Plan:   Post-operative Plan: Extubation in OR  Informed Consent: I have reviewed the patients History and Physical, chart, labs and discussed the procedure including the risks, benefits and alternatives for the proposed anesthesia with the patient or  authorized representative who has indicated his/her understanding and acceptance.     Dental Advisory Given  Plan Discussed with: Anesthesiologist, CRNA and Surgeon  Anesthesia Plan Comments: (Patient consented for risks of anesthesia including but not limited to:  - adverse reactions to medications - damage to eyes, teeth, lips or other oral mucosa - nerve damage due to positioning  - sore throat or hoarseness - Damage to heart, brain, nerves, lungs, other parts of body or loss of life  Patient voiced understanding and assent.)       Anesthesia Quick Evaluation

## 2023-01-19 NOTE — Discharge Instructions (Signed)

## 2023-01-19 NOTE — H&P (Signed)
 01/19/23  Reason for Visit:  RUQ pain   History of Present Illness: Brandy Robinson is a 58 y.o. female presents for evaluation of RUQ pain.  The patient presented to the ED on 10/30/22 with epigastric and RUQ pain associated with nausea and vomiting.  Labwork showed a total bilirubin of 1.4, AST 123, ALT 32, Alk Phos 178.  She had an ultrasound which did not show any gallstones or wall thickening, but CBD was 9 mm.  MRCP was also obtained which showed a distended gallbladder, no visible gallstones, CBD of 6 mm.  It was thought that she had passed a gallstone.  She was referred to GI for further evaluation.   The patient reports months history of RUQ / epigastric pain which is worsened by eating greasy foods.  She was diagnosed many years ago with gastroparesis on nuclear medicine gastric emptying study in 2011.  She has also had prior ultrasound in 2011 which also did not show gallstones or sludge, but then another ultrasound in 2021 which did show layering sludge.  She reports that after eating she will feel pain, bloatedness, and takes a few hours for the symptoms to go away.  Repeat labs on 12/07/22, showed normal total bilirubin of 0.2, normalized AST/ALT, and stable Alk Phos of 162.   Of note, the patient has a history of exploratory laparotomy with repair of perforated gastric ulcer in 2016 and a robotic ventral hernia repair at [facility] in 2021 which required a 18 x 33 retrorectus bard soft mesh.   Past Medical History:     Past Medical History:  Diagnosis Date   Anxiety     Bipolar disorder (HCC)     Depression     Disc disorder     Dyspareunia     Endometriosis     GERD (gastroesophageal reflux disease)     Insomnia     Lichen     Surgical menopause     Vaginal atrophy     Wears glasses            Past Surgical History:      Past Surgical History:  Procedure Laterality Date   ABDOMINAL HYSTERECTOMY   01/17/1993    tah-bso   ARM WOUND REPAIR / CLOSURE   01/18/2011     cellulitis rt arm-i/d   KNEE BURSECTOMY Left 09/24/2013    Procedure: IRRIGATION AND DEBRIDEMENT OF LEFT KNEE ;  Surgeon: Evalene JONETTA Chancy, MD;  Location: [facility];  Service: Orthopedics;  Laterality: Left;   LAPAROTOMY N/A 12/30/2014    Procedure: EXPLORATORY LAPAROTOMY-graham patch of peptic ulcer;  Surgeon: Carlin Pastel, MD;  Location: [facility] ORS;  Service: General;  Laterality: N/A;   Transthoracic echo       TRANSTHORACIC ECHOCARDIOGRAM   03/25/2017    01/01/2015 [facility]): EF 50 to 55%.  Low normal range.  No RWMA.  Mild MR.;; 03/2017:: 03/22/2017 [facility]): EF 45 to 50%.  Mildly reduced.  No RWMA.  GR 1 DD.          Home Medications:        Prior to Admission medications   Medication Sig Start Date End Date Taking? Authorizing Provider  ACCU-CHEK GUIDE test strip USE TO CHECK BLOOD SUGAR TWICE DAILY BEFORE MEALS 09/27/22   Yes [provider]  Blood Glucose Monitoring Suppl (ACCU-CHEK GUIDE) w/Device KIT USE AS DIRECTED TO CHECK BLOOD GLUCOSE 09/29/22   Yes [provider]  busPIRone  (BUSPAR ) 15 MG tablet Take 15 mg  by mouth 2 (two) times daily. 10/25/22   Yes [provider]  clobetasol  ointment (TEMOVATE ) 0.05 % Apply 1 Application topically 2 (two) times a week. 04/21/22 06/15/23 Yes Brandy Alm Agent, MD  clonazePAM  (KLONOPIN ) 0.5 MG tablet Take 0.5 mg by mouth at bedtime as needed. 10/21/22   Yes [provider]  DULoxetine  (CYMBALTA ) 60 MG capsule Take 120 mg by mouth daily. 10/24/22   Yes [provider]  estradiol  (ESTRACE ) 1 MG tablet Take 1.5 tablets (1.5 mg total) by mouth daily. 10/31/22 10/31/23 Yes Brandy Alm Agent, MD  JARDIANCE 25 MG TABS tablet Take 25 mg by mouth daily. 10/22/22   Yes [provider]  lisinopril  (ZESTRIL ) 20 MG tablet Take 20 mg by mouth daily. 08/22/22   Yes [provider]  ondansetron  (ZOFRAN -ODT) 4 MG disintegrating tablet Take 4 mg by mouth 2 (two) times daily as needed. 10/17/22    Yes [provider]  pantoprazole  (PROTONIX ) 40 MG tablet Take 1 tablet (40 mg total) by mouth daily. 12/07/22 12/02/23 Yes Brandy [place], PA-C  rosuvastatin (CRESTOR) 20 MG tablet Take 20 mg by mouth daily. 10/24/22   Yes [provider]  sucralfate  (CARAFATE ) 1 g tablet Take 1 tablet (1 g total) by mouth 3 (three) times daily before meals. 12/07/22 03/07/23 Yes Brandy [place], PA-C  traMADol  (ULTRAM ) 50 MG tablet Take 50 mg by mouth 2 (two) times daily as needed. 10/29/22   Yes [provider]  VENTOLIN  HFA 108 (90 Base) MCG/ACT inhaler SMARTSIG:1-2 Puff(s) By Mouth Every 4-6 Hours PRN 10/17/22   Yes [provider]      Allergies: Allergies       Allergies  Allergen Reactions   Hydrocodone Itching   Aspirin  Other (See Comments)      Reaction:  GI upset         Social History:  reports that she has been smoking cigarettes. She quit smokeless tobacco use about 2 years ago. She reports that she does not drink alcohol and does not use drugs.    Family History:      Family History  Problem Relation Age of Onset   Diabetes Mother     Diabetes Sister     Cancer Father          Throat   Heart disease Neg Hx     Breast cancer Neg Hx     Ovarian cancer Neg Hx     Colon cancer Neg Hx            Review of Systems: Review of Systems  Constitutional:  Negative for chills and fever.  HENT:  Negative for hearing loss.   Respiratory:  Negative for shortness of breath.   Cardiovascular:  Negative for chest pain.  Gastrointestinal:  Positive for abdominal pain, nausea and vomiting.  Genitourinary:  Negative for dysuria.  Musculoskeletal:  Negative for myalgias.  Skin:  Negative for rash.  Neurological:  Negative for dizziness.  Psychiatric/Behavioral:  Negative for depression.       Physical Exam BP 139/83   Pulse 92   Temp 98.3 F (36.8 C) (Oral)   Ht 5' 3 (1.6 m)   Wt 160 lb 12.8 oz (72.9 kg)   SpO2 96%   BMI 28.48 kg/m  CONSTITUTIONAL:  No acute distress, well nourished. HEENT:  Normocephalic, atraumatic, extraocular motion intact. NECK: Trachea is midline, and there is no jugular venous distension.  RESPIRATORY:  Lungs are clear, and breath sounds are equal bilaterally. Normal  respiratory effort without pathologic use of accessory muscles. CARDIOVASCULAR: Heart is regular without murmurs, gallops, or rubs. GI: The abdomen is soft, non-distended, with some tenderness to palpation in the right upper quadrant and epigastric areas. Negative Murphy's sign.  Prior incisions are well healed, without evidence of hernia recurrence.  MUSCULOSKELETAL:  Normal muscle strength and tone in all four extremities.  No peripheral edema or cyanosis. SKIN: Skin turgor is normal. There are no pathologic skin lesions.  NEUROLOGIC:  Motor and sensation is grossly normal.  Cranial nerves are grossly intact. PSYCH:  Alert and oriented to person, place and time. Affect is normal.   Laboratory Analysis: Labs on 12/07/22: Na 138, K 3.5, Cl 101, CO2 20, BUN 10, Cr 1.2.  Total bili 0.2, AST 20, ALT 11, Alk Phos 162, albumin 4.  WBC 9.2, Hgb 12.8, Hct 38.1, Plt 232.   Imaging: Ultrasound RUQ on 10/30/22: IMPRESSION: 1. Dilatation of the common bile duct (9 mm). No other acute findings are noted. If there is clinical concern for choledocholithiasis or other cause of biliary tract obstruction, further evaluation with MRI of the abdomen with and without IV gadolinium with MRCP should be considered. 2. Diffusely increased hepatic parenchymal echogenicity suggesting hepatic steatosis. There is also a nodular contour of the liver suggesting underlying cirrhosis.   MRCP on 10/30/22: IMPRESSION: 1. No acute findings in the abdomen to account for the patient's symptoms. 2. Specifically, no choledocholithiasis and no findings of biliary tract obstruction. 3. Hepatic steatosis. 4. Aortic atherosclerosis.     Assessment and Plan: This is a 58 y.o. female  with RUQ pain and possible symptomatic cholelithiasis.   --Discussed with the patient the findings on her imaging studies and labs.  She does have a history of gastroparesis and perforated gastric ulcer.  Discussed with her how these can contribute to her abdominal pain.  However, these should not be changing her LFTs, or causing changes in the size of her common bile duct as can be seen in different ultrasound studies that she's had.  I do think there is a component to her pain coming from her gallbladder.  She may have sludge as seen in other ultrasound study and may have passed either a gallstone or sludge when she presented to the ED.  Discussed with her that it is reasonable to proceed with surgery, but unfortunately could not guarantee her that all her symptoms would improve after surgery.  She is in agreement to proceed with surgery. --Discussed with her the plan for a robotic assisted cholecystectomy, and reviewed the surgery at length with her including the planned incisions, the risks of bleeding, infection, injury to surrounding structures, that this would be an outpatient surgery, post-operative activity restrictions, pain control, and she's willing to proceed. --Patient is scheduled for surgery on 01/22/23.  All of her questions have been answered.     Aloysius Sheree Plant, MD [facility]

## 2023-01-19 NOTE — Transfer of Care (Signed)
 Immediate Anesthesia Transfer of Care Note  Patient: Brandy Robinson  Procedure(s) Performed: XI ROBOTIC ASSISTED LAPAROSCOPIC CHOLECYSTECTOMY INDOCYANINE GREEN  FLUORESCENCE IMAGING (ICG)  Patient Location: PACU  Anesthesia Type:General  Level of Consciousness: drowsy and patient cooperative  Airway & Oxygen Therapy: Patient Spontanous Breathing and Patient connected to face mask oxygen  Post-op Assessment: Report given to RN and Post -op Vital signs reviewed and stable  Post vital signs: stable  Last Vitals:  Vitals Value Taken Time  BP    Temp    Pulse 94 01/19/23 0956  Resp 27 01/19/23 0956  SpO2 99 % 01/19/23 0956  Vitals shown include unfiled device data.  Last Pain:  Vitals:   01/19/23 0620  TempSrc: Oral  PainSc: 5          Complications: No notable events documented.

## 2023-01-19 NOTE — Op Note (Addendum)
 Procedure Date:  01/19/2023  Pre-operative Diagnosis:  Right upper quadrant pain, cholelithiasis/sludge  Post-operative Diagnosis: Right upper quadrant pain, cholelithiasis/sludge, significant intra-abdominal adhesions.  Procedure:  Robotic assisted cholecystectomy with ICG FireFly cholangiogram, extensive lysis of adhesions of 60 minutes.  Surgeon:  Aloysius Sheree Plant, MD  Anesthesia:  General endotracheal  Estimated Blood Loss:  30 ml  Specimens:  gallbladder  Findings:  Extensive adhesions of omentum to the anterior abdominal wall, adhesions of the stomach to the liver and gallbladder.  Complications:  serosal cautery injury to transverse colon, which was primarily repaired using 3-0 Silk suture x 2.  Indications for Procedure:  This is a 58 y.o. female who presents with abdominal pain and workup revealing possible cholelithiasis/sludge.  The benefits, complications, treatment options, and expected outcomes were discussed with the patient. The risks of bleeding, infection, recurrence of symptoms, failure to resolve symptoms, bile duct damage, bile duct leak, retained common bile duct stone, bowel injury, and need for further procedures were all discussed with the patient and she was willing to proceed.  Description of Procedure: The patient was correctly identified in the preoperative area and brought into the operating room.  The patient was placed supine with VTE prophylaxis in place.  Appropriate time-outs were performed.  Anesthesia was induced and the patient was intubated.  Appropriate antibiotics were infused.  The abdomen was prepped and draped in a sterile fashion. An infraumbilical incision was made. A cutdown technique was used to enter the abdominal cavity without injury, and a 12 mm robotic port was inserted.  Pneumoperitoneum was obtained with appropriate opening pressures.  Upon initial insertion of the camera, the patient was noted to have significant adhesions of the  omentum to the anterior abdominal wall.  Initially could not see any room for other port placement.  Eventually after navigating through the adhesions, there was space in the right lateral abdominal wall for one port.  An 8 mm port was inserted in right lateral location.  Hook cautery was then used to lyse adhesions of the omentum.  Some adhesions were very dense/thick, and some were thinner.  Combination of blunt and cautery dissection was used to eventually make enough room for additional ports.  In doing this lysis of adhesions, a small cautery serosal injury was noted in the transverse colon.  Two additional 8-mm ports were placed in the mid abdomen at the level of the umbilicus under direct visualization.  The DaVinci platform was docked, camera targeted, and instruments were placed under direct visualization.  I continued with lysis of adhesions of the more superior adhesions.  Further omentum was taken down, and eventually the stomach was also dissected off the gallbladder and liver without injury.  A 3-0 Silk suture was inserted and the serosal injury was primarily repaired.  The gallbladder was grasped and retracted cephalad.  The infundibulum was grasped and retracted laterally, exposing the peritoneum overlying the gallbladder.  This was incised with electrocautery and extended on either side of the gallbladder.  FireFly cholangiogram was then obtained, and we were able to clearly identify the cystic duct and common bile duct.  The cystic duct and cystic artery were carefully dissected with combination of cautery and blunt dissection.  Both were clipped twice proximally and once distally, cutting in between.  The gallbladder was taken from the gallbladder fossa in a retrograde fashion with electrocautery. The gallbladder was placed in an Endocatch bag. The liver bed was inspected and any bleeding was controlled with electrocautery. The abdominal cavity  was explored again and there was no further  bleeding and no other bowel injury noted.  The right upper quadrant was then inspected again revealing intact clips, no bleeding, and no ductal injury.  The area was thoroughly irrigated.  The 8 mm ports were removed under direct visualization and the 12 mm port was removed.  The Endocatch bag was brought out via the umbilical incision. The fascial opening was closed using 0 vicryl suture.  Local anesthetic was infused in all incisions and the incisions were closed with 4-0 Monocryl.  The wounds were cleaned and sealed with DermaBond.  The patient was emerged from anesthesia and extubated and brought to the recovery room for further management.  The patient tolerated the procedure well and all counts were correct at the end of the case.   Aloysius Sheree Plant, MD

## 2023-01-20 LAB — SURGICAL PATHOLOGY

## 2023-01-26 ENCOUNTER — Ambulatory Visit: Payer: Medicaid Other | Admitting: Cardiology

## 2023-02-01 ENCOUNTER — Ambulatory Visit (INDEPENDENT_AMBULATORY_CARE_PROVIDER_SITE_OTHER): Payer: Medicaid Other | Admitting: Physician Assistant

## 2023-02-01 ENCOUNTER — Encounter: Payer: Self-pay | Admitting: Physician Assistant

## 2023-02-01 VITALS — BP 133/72 | HR 87 | Temp 98.1°F | Ht 63.0 in | Wt 161.4 lb

## 2023-02-01 DIAGNOSIS — R1011 Right upper quadrant pain: Secondary | ICD-10-CM

## 2023-02-01 DIAGNOSIS — Z09 Encounter for follow-up examination after completed treatment for conditions other than malignant neoplasm: Secondary | ICD-10-CM

## 2023-02-01 DIAGNOSIS — K8020 Calculus of gallbladder without cholecystitis without obstruction: Secondary | ICD-10-CM

## 2023-02-01 NOTE — Patient Instructions (Signed)
 You may get Miralax OTC for constipation.   GENERAL POST-OPERATIVE PATIENT INSTRUCTIONS   WOUND CARE INSTRUCTIONS:  Keep a dry clean dressing on the wound if there is drainage. The initial bandage may be removed after 24 hours.  Once the wound has quit draining you may leave it open to air.  If clothing rubs against the wound or causes irritation and the wound is not draining you may cover it with a dry dressing during the daytime.  Try to keep the wound dry and avoid ointments on the wound unless directed to do so.  If the wound becomes bright red and painful or starts to drain infected material that is not clear, please contact your physician immediately.  If the wound is mildly pink and has a thick firm ridge underneath it, this is normal, and is referred to as a healing ridge.  This will resolve over the next 4-6 weeks.  BATHING: You may shower if you have been informed of this by your surgeon. However, Please do not submerge in a tub, hot tub, or pool until incisions are completely sealed or have been told by your surgeon that you may do so.  DIET:  You may eat any foods that you can tolerate.  It is a good idea to eat a high fiber diet and take in plenty of fluids to prevent constipation.  If you do become constipated you may want to take a mild laxative or take ducolax tablets on a daily basis until your bowel habits are regular.  Constipation can be very uncomfortable, along with straining, after recent surgery.  ACTIVITY:  You are encouraged to cough and deep breath or use your incentive spirometer if you were given one, every 15-30 minutes when awake.  This will help prevent respiratory complications and low grade fevers post-operatively if you had a general anesthetic.  You may want to hug a pillow when coughing and sneezing to add additional support to the surgical area, if you had abdominal or chest surgery, which will decrease pain during these times.  You are encouraged to walk and engage  in light activity for the next two weeks.  You should not lift more than 20 pounds for 6 weeks total after surgery as it could put you at increased risk for complications.  Twenty pounds is roughly equivalent to a plastic bag of groceries. At that time- Listen to your body when lifting, if you have pain when lifting, stop and then try again in a few days. Soreness after doing exercises or activities of daily living is normal as you get back in to your normal routine.  MEDICATIONS:  Try to take narcotic medications and anti-inflammatory medications, such as tylenol , ibuprofen , naprosyn , etc., with food.  This will minimize stomach upset from the medication.  Should you develop nausea and vomiting from the pain medication, or develop a rash, please discontinue the medication and contact your physician.  You should not drive, make important decisions, or operate machinery when taking narcotic pain medication.  SUNBLOCK Use sun block to incision area over the next year if this area will be exposed to sun. This helps decrease scarring and will allow you avoid a permanent darkened area over your incision.  QUESTIONS:  Please feel free to call our office if you have any questions, and we will be glad to assist you. [phone]

## 2023-02-01 NOTE — Progress Notes (Signed)
[facility] POST-OP OFFICE VISIT  02/02/2023  HPI: Brandy Robinson is a 58 y.o. female 13 days s/p robotic assisted laparoscopic cholecystectomy for cholelithiasis with Dr Aleen Campi   She was doing well until about 5 days ago She reports constipation over the last 5 days; no BM; not taking anything Abdominal soreness to the right and umbilical incisions No fever, chills, nausea, emesis She had been tolerating PO; no diarrhea prior to this Ambulating well Back to work   Vital signs: BP 133/72   Pulse 87   Temp 98.1 F (36.7 C) (Oral)   Ht 5\' 3"  (1.6 m)   Wt 161 lb 6.4 oz (73.2 kg)   SpO2 95%   BMI 28.59 kg/m    Physical Exam: Constitutional: Well appearing female, NAD Abdomen: Soft, expected incisional soreness, non-distended, no rebound/guarding Skin: Laparoscopic incisions are healing well, no erythema or drainage   Assessment/Plan: This is a 58 y.o. female 13 days s/p robotic assisted laparoscopic cholecystectomy for cholelithiasis with Dr Aleen Campi    - Likely constipated secondary to use of narcotic pain medications without support of stool softener nor laxative. Recommend starting with Miralax daily and titration to BID as needed. Okay to utilize daily stool softener as well until BM become regular again.   - Pain control prn; OTC modalities should be sufficient at this point  - Reviewed wound care recommendation  - Reviewed lifting restrictions; 4 weeks total  - Reviewed surgical pathology; CCC  - She can follow up on as needed basis; She understands to call with questions/concerns. Encouraged to call if constipation does not resolve with recommendations as above; She understands and is in agreement.   -- Lynden Oxford, PA-C [facility] 02/02/2023, 7:59 AM M-F: 7am - 4pm

## 2023-02-17 ENCOUNTER — Telehealth: Payer: Self-pay

## 2023-02-17 NOTE — Telephone Encounter (Signed)
Patient called with concerns about her Lichen Sclerosis. She is treating it with Clobetasol ointment, but she reports that this ointment is no longer relieving her symptoms. She would like an alternate medication went to [facility] on [address]. Please advise.

## 2023-02-20 NOTE — Telephone Encounter (Signed)
Patient following up on previous request. Advised Dr. Logan Bores was not in the office on Friday and is in surgery today. He will return to the office tomorrow.

## 2023-04-17 ENCOUNTER — Other Ambulatory Visit: Payer: Self-pay | Admitting: Obstetrics and Gynecology

## 2023-04-17 DIAGNOSIS — Z01419 Encounter for gynecological examination (general) (routine) without abnormal findings: Secondary | ICD-10-CM

## 2023-04-19 NOTE — Progress Notes (Deleted)
  Cardiology Office Note:  .   Date:  04/19/2023  ID:  Brandy Robinson, DOB Jan 23, 1965, MRN [id number] PCP: Armando Gang, FNP  [facility] Providers Cardiologist:  None { Click to update primary MD,subspecialty MD or APP then REFRESH:1}    No chief complaint on file.   Patient Profile: .     Brandy Robinson is a *** 58 y.o. female *** with a PMH notable for *** who presents here for *** at the request of Armando Gang, FNP.  Brandy Robinson is a overweight/borderline obese 58 y.o. female "former "smoker (by report quit in 2022, but restarted) with a PMH notable for Mildly Reduced EF by Echo (with Prior DHF in setting of PNA), Anxiety/Bipolar Disorder, dyspareunia (surgical menopause-TAH 1995) and endometriosis, GERD who presents here for "evaluation of a leaking valve" at the request of Armando Gang, FNP        Brandy Robinson was last seen on ***  Subjective  Discussed the use of AI scribe software for clinical note transcription with the patient, who gave verbal consent to proceed.  History of Present Illness     Cardiovascular ROS: {roscv:310661}  ROS:  Review of Systems - {ros master:310782}    Objective    Studies Reviewed: Marland Kitchen        ECHO: *** CATH: *** MONITOR: *** CT: ***  Risk Assessment/Calculations:   {Does this patient have ATRIAL FIBRILLATION?:[phone]} No BP recorded.  {Refresh Note OR Click here to enter BP  :1}***         Physical Exam:   VS:  There were no vitals taken for this visit.   Wt Readings from Last 3 Encounters:  02/01/23 161 lb 6.4 oz (73.2 kg)  01/19/23 156 lb (70.8 kg)  12/12/22 160 lb 12.8 oz (72.9 kg)    GEN: Well nourished, well developed in no acute distress; *** NECK: No JVD; No carotid bruits CARDIAC: Normal S1, S2; RRR, no murmurs, rubs, gallops RESPIRATORY:  Clear to auscultation without rales, wheezing or rhonchi ; nonlabored, good air movement. ABDOMEN: Soft, non-tender,  non-distended EXTREMITIES:  No edema; No deformity      ASSESSMENT AND PLAN: .    Problem List Items Addressed This Visit   None   Assessment and Plan Assessment & Plan        {Are you ordering a CV Procedure (e.g. stress test, cath, DCCV, TEE, etc)?   Press F2        :[id number]}   Follow-Up: No follow-ups on file.  Total time spent: *** min spent with patient + *** min spent charting = *** min    Signed, Marykay Lex, MD, MS Bryan Lemma, M.D., M.S. Interventional Cardiologist  [facility]  Pager # [phone] Phone # [phone] [address] [place], [place] [address]

## 2023-04-21 ENCOUNTER — Ambulatory Visit: Payer: Medicaid Other | Admitting: Cardiology

## 2023-05-17 ENCOUNTER — Other Ambulatory Visit (HOSPITAL_COMMUNITY)
Admission: RE | Admit: 2023-05-17 | Discharge: 2023-05-17 | Disposition: A | Discharge status: Home / Self Care | Source: Ambulatory Visit | Attending: Obstetrics and Gynecology | Admitting: Obstetrics and Gynecology

## 2023-05-17 ENCOUNTER — Encounter: Payer: Self-pay | Admitting: Obstetrics and Gynecology

## 2023-05-17 ENCOUNTER — Ambulatory Visit (INDEPENDENT_AMBULATORY_CARE_PROVIDER_SITE_OTHER): Admitting: Obstetrics and Gynecology

## 2023-05-17 VITALS — BP 146/79 | HR 84 | Ht 63.0 in | Wt 156.4 lb

## 2023-05-17 DIAGNOSIS — L900 Lichen sclerosus et atrophicus: Secondary | ICD-10-CM

## 2023-05-17 DIAGNOSIS — Z1272 Encounter for screening for malignant neoplasm of vagina: Secondary | ICD-10-CM | POA: Insufficient documentation

## 2023-05-17 DIAGNOSIS — Z01419 Encounter for gynecological examination (general) (routine) without abnormal findings: Secondary | ICD-10-CM

## 2023-05-17 DIAGNOSIS — Z1231 Encounter for screening mammogram for malignant neoplasm of breast: Secondary | ICD-10-CM

## 2023-05-17 DIAGNOSIS — N898 Other specified noninflammatory disorders of vagina: Secondary | ICD-10-CM | POA: Diagnosis present

## 2023-05-17 DIAGNOSIS — Z1211 Encounter for screening for malignant neoplasm of colon: Secondary | ICD-10-CM

## 2023-05-17 MED ORDER — ESTRADIOL 1 MG PO TABS: 1.5000 mg | ORAL_TABLET | Freq: Every day | ORAL | 3 refills | Status: AC

## 2023-05-17 MED ORDER — CLOBETASOL PROPIONATE 0.05 % EX OINT: 1.0000 | TOPICAL_OINTMENT | CUTANEOUS | 1 refills | Status: AC

## 2023-05-17 NOTE — Progress Notes (Signed)
 Patients presents for annual exam today. She states vaginal irritation due to lichen sclerous, currently using clobetasol  3-4 times weekly with no relief. History of hysterectomy due to endometriosis. Due for mammogram and colonoscopy, ordered. Patients annual labs are ordered. She states no other questions or concerns at this time.

## 2023-05-17 NOTE — Progress Notes (Signed)
 HPI:      [name] is a 58 y.o. G1P1001 who LMP was No LMP recorded. Patient has had a hysterectomy.  Subjective:   She presents today for her annual examination.  She recently had emergency gall bladder surgery and says that she has not yet recovered completely. She does state that she has been using the clobetasol  3 times per week but continues to have some vulvar but especially vaginal itching.  She reports taking antibiotics about 1 month ago. She remains on the estrogen and says that her hot flashes are good.    Hx: The following portions of the patient's history were reviewed and updated as appropriate:             She  has a past medical history of Anxiety, Bipolar disorder (HCC), COPD (chronic obstructive pulmonary disease) (HCC), Depression, Diabetes mellitus without complication (HCC), Disc disorder, Dyspareunia, Endometriosis, GERD (gastroesophageal reflux disease), Insomnia, Lichen, Surgical menopause, Vaginal atrophy, and Wears glasses. She does not have any pertinent problems on file. She  has a past surgical history that includes Abdominal hysterectomy (01/17/1993); Arm wound repair / closure (01/18/2011); Knee bursectomy (Left, 09/24/2013); laparotomy (N/A, 12/30/2014); Transthoracic echo; and transthoracic echocardiogram (03/25/2017). Her family history includes Cancer in her father; Diabetes in her mother and sister. She  reports that she quit smoking about 2 years ago. Her smoking use included cigarettes. She quit smokeless tobacco use about 3 years ago. She reports that she does not drink alcohol and does not use drugs. She has a current medication list which includes the following prescription(s): accu-chek guide, acetaminophen , accu-chek guide, buspirone , [START ON 05/18/2023] clobetasol  ointment, clonazepam , duloxetine , estradiol , jardiance, lisinopril , ondansetron , oxycodone , pantoprazole , rosuvastatin, sucralfate , tramadol , and ventolin  hfa. She is allergic to  hydrocodone and aspirin .       Review of Systems:  Review of Systems  Constitutional: Denied constitutional symptoms, night sweats, recent illness, fatigue, fever, insomnia and weight loss.  Eyes: Denied eye symptoms, eye pain, photophobia, vision change and visual disturbance.  Ears/Nose/Throat/Neck: Denied ear, nose, throat or neck symptoms, hearing loss, nasal discharge, sinus congestion and sore throat.  Cardiovascular: Denied cardiovascular symptoms, arrhythmia, chest pain/pressure, edema, exercise intolerance, orthopnea and palpitations.  Respiratory: Denied pulmonary symptoms, asthma, pleuritic pain, productive sputum, cough, dyspnea and wheezing.  Gastrointestinal: Denied, gastro-esophageal reflux, melena, nausea and vomiting.  Genitourinary: See HPI for additional information.  Musculoskeletal: Denied musculoskeletal symptoms, stiffness, swelling, muscle weakness and myalgia.  Dermatologic: Denied dermatology symptoms, rash and scar.  Neurologic: Denied neurology symptoms, dizziness, headache, neck pain and syncope.  Psychiatric: Denied psychiatric symptoms, anxiety and depression.  Endocrine: Denied endocrine symptoms including hot flashes and night sweats.   Meds:   Current Outpatient Medications on File Prior to Visit  Medication Sig Dispense Refill   ACCU-CHEK GUIDE test strip USE TO CHECK BLOOD SUGAR TWICE DAILY BEFORE MEALS     acetaminophen  (TYLENOL ) 500 MG tablet Take 2 tablets (1,000 mg total) by mouth every 6 (six) hours as needed for mild pain (pain score 1-3).     Blood Glucose Monitoring Suppl (ACCU-CHEK GUIDE) w/Device KIT USE AS DIRECTED TO CHECK BLOOD GLUCOSE     busPIRone  (BUSPAR ) 15 MG tablet Take 15 mg by mouth 2 (two) times daily.     clonazePAM  (KLONOPIN ) 0.5 MG tablet Take 0.5 mg by mouth at bedtime.     DULoxetine  (CYMBALTA ) 60 MG capsule Take 120 mg by mouth at bedtime.     JARDIANCE 25 MG TABS tablet Take 25  mg by mouth daily.     lisinopril  (ZESTRIL ) 20  MG tablet Take 20 mg by mouth daily.     ondansetron  (ZOFRAN -ODT) 4 MG disintegrating tablet Take 4 mg by mouth 2 (two) times daily as needed for vomiting or nausea.     oxyCODONE  (OXY IR/ROXICODONE ) 5 MG immediate release tablet Take 1 tablet (5 mg total) by mouth every 4 (four) hours as needed for severe pain (pain score 7-10). 30 tablet 0   pantoprazole  (PROTONIX ) 40 MG tablet Take 1 tablet (40 mg total) by mouth daily. 90 tablet 3   rosuvastatin (CRESTOR) 20 MG tablet Take 20 mg by mouth daily.     sucralfate  (CARAFATE ) 1 g tablet Take 1 tablet (1 g total) by mouth 3 (three) times daily before meals. 90 tablet 2   traMADol  (ULTRAM ) 50 MG tablet Take 50 mg by mouth 2 (two) times daily as needed for moderate pain (pain score 4-6).     VENTOLIN  HFA 108 (90 Base) MCG/ACT inhaler Inhale 1-2 puffs into the lungs every 6 (six) hours as needed for wheezing or shortness of breath.     No current facility-administered medications on file prior to visit.     Objective:     Vitals:   05/17/23 0925  BP: (!) 146/79  Pulse: 84    Filed Weights   05/17/23 0925  Weight: 156 lb 6.4 oz (70.9 kg)              Physical examination General NAD, Conversant  HEENT Atraumatic; Op clear with mmm.  Normo-cephalic.  Anicteric sclerae  Thyroid /Neck Smooth without nodularity or enlargement. Normal ROM.  Neck Supple.  Skin No rashes, lesions or ulceration. Normal palpated skin turgor. No nodularity.  Breasts: No masses or discharge.  Symmetric.  No axillary adenopathy.  Lungs: Clear to auscultation.No rales or wheezes. Normal Respiratory effort, no retractions.  Heart: NSR.  No murmurs or rubs appreciated. No peripheral edema  Abdomen: Soft.  Non-tender.  No masses.  No HSM. No hernia  Extremities: Moves all appropriately.  Normal ROM for age. No lymphadenopathy.  Neuro: Oriented to PPT.  Normal mood. Normal affect.     Pelvic:   Vulva: Lichen sclerosus appears well-controlled however some residual  fusion of the anterior labia noted.  Vagina: Atrophy much improved-vaginal cuff appears somewhat friable and irritated.  Support: Normal pelvic support.  Urethra No masses tenderness or scarring.  Meatus Normal size without lesions or prolapse.  Cervix: Surgically absent  Anus: Normal exam.  No lesions.  Perineum: Normal exam.  No lesions.        Bimanual   Uterus: Surgically absent  Adnexae: No masses.  Non-tender to palpation.  Cul-de-sac: Negative for abnormality.     Assessment:    G1P1001 Patient Active Problem List   Diagnosis Date Noted   Symptomatic cholelithiasis 01/19/2023   Valvular heart disease 11/03/2022   Current smoker 11/03/2022   Hyperlipidemia associated with type 2 diabetes mellitus (HCC) 11/03/2022   Essential hypertension 11/03/2022   Peripheral neuropathy 11/03/2022   Healthcare maintenance 11/03/2022   Sepsis (HCC) 02/25/2021   Encounter for medication management 10/08/2018   SUI (stress urinary incontinence, female) 05/19/2017   Generalized anxiety disorder 03/21/2017   CAP (community acquired pneumonia) 03/18/2017   Acute respiratory failure (HCC)    Perforated viscus 12/30/2014   Lichen sclerosus 09/24/2014   Surgical menopause 09/24/2014   Endometriosis 09/24/2014   Bipolar 1 disorder (HCC) 09/24/2014   Insomnia 09/24/2014   GERD (gastroesophageal  reflux disease) 09/24/2014   History of stomach ulcers 09/24/2014     1. Well woman exam with routine gynecological exam   2. Screening mammogram for breast cancer   3. Colon cancer screening   4. Lichen sclerosus   5. Vagina itching        Plan:            1.  Basic Screening Recommendations The basic screening recommendations for asymptomatic women were discussed with the patient during her visit.  The age-appropriate recommendations were discussed with her and the rational for the tests reviewed.  When I am informed by the patient that another primary care physician has previously  obtained the age-appropriate tests and they are up-to-date, only outstanding tests are ordered and referrals given as necessary.  Abnormal results of tests will be discussed with her when all of her results are completed.  Routine preventative health maintenance measures emphasized: Exercise/Diet/Weight control, Tobacco Warnings, Alcohol/Substance use risks and Stress Management Mammogram ordered 2.  Pap of the vaginal cuff performed because of its irritated appearance 3.  Nuswab 4.  Continue use of clobetasol  cream. 5.  Continue ERT.  Orders Orders Placed This Encounter  Procedures   MM DIGITAL SCREENING BILATERAL   Ambulatory referral to Gastroenterology     Meds ordered this encounter  Medications   clobetasol  ointment (TEMOVATE ) 0.05 %    Sig: Apply 1 Application topically 2 (two) times a week.    Dispense:  60 g    Refill:  1   estradiol  (ESTRACE ) 1 MG tablet    Sig: Take 1.5 tablets (1.5 mg total) by mouth daily.    Dispense:  135 tablet    Refill:  3          F/U  No follow-ups on file.  Delice Felt, M.D. 05/17/2023 9:36 AM

## 2023-05-18 ENCOUNTER — Telehealth: Payer: Self-pay

## 2023-05-18 ENCOUNTER — Other Ambulatory Visit: Payer: Self-pay

## 2023-05-18 ENCOUNTER — Other Ambulatory Visit: Payer: Self-pay | Admitting: Obstetrics and Gynecology

## 2023-05-18 DIAGNOSIS — Z1211 Encounter for screening for malignant neoplasm of colon: Secondary | ICD-10-CM

## 2023-05-18 DIAGNOSIS — N760 Acute vaginitis: Secondary | ICD-10-CM

## 2023-05-18 DIAGNOSIS — Z1231 Encounter for screening mammogram for malignant neoplasm of breast: Secondary | ICD-10-CM

## 2023-05-18 LAB — CERVICOVAGINAL ANCILLARY ONLY
Bacterial Vaginitis (gardnerella): POSITIVE — AB
Candida Glabrata: NEGATIVE
Candida Vaginitis: NEGATIVE
Comment: NEGATIVE
Comment: NEGATIVE
Comment: NEGATIVE

## 2023-05-18 LAB — CYTOLOGY - PAP
Comment: NEGATIVE
High risk HPV: NEGATIVE

## 2023-05-18 MED ORDER — METRONIDAZOLE 500 MG PO TABS: 500.0000 mg | ORAL_TABLET | Freq: Two times a day (BID) | ORAL | 0 refills | Status: DC

## 2023-05-18 MED ORDER — NA SULFATE-K SULFATE-MG SULF 17.5-3.13-1.6 GM/177ML PO SOLN: 1.0000 | Freq: Once | ORAL | 0 refills | Status: AC

## 2023-05-18 NOTE — Telephone Encounter (Signed)
 Gastroenterology Pre-Procedure Review  Request Date: 06/13/23 Requesting Physician: Dr. Ole Berkeley  PATIENT REVIEW QUESTIONS: The patient responded to the following health history questions as indicated:    1. Are you having any GI issues? no 2. Do you have a personal history of Polyps? no 3. Do you have a family history of Colon Cancer or Polyps? no 4. Diabetes Mellitus? yes (has been advised to stop jardiance 3 days prior and noted on instructions) 5. Joint replacements in the past 12 months?no 6. Major health problems in the past 3 months?yes (gallbladder surgery in Feb 2025) 7. Any artificial heart valves, MVP, or defibrillator? No devices, however cardiac history.  Cardiac clearance has been sent to heart care.    MEDICATIONS & ALLERGIES:    Patient reports the following regarding taking any anticoagulation/antiplatelet therapy:   Plavix, Coumadin, Eliquis, Xarelto, Lovenox , Pradaxa, Brilinta, or Effient? no Aspirin ? no  Patient confirms/reports the following medications:  Current Outpatient Medications  Medication Sig Dispense Refill   ACCU-CHEK GUIDE test strip USE TO CHECK BLOOD SUGAR TWICE DAILY BEFORE MEALS     acetaminophen  (TYLENOL ) 500 MG tablet Take 2 tablets (1,000 mg total) by mouth every 6 (six) hours as needed for mild pain (pain score 1-3).     Blood Glucose Monitoring Suppl (ACCU-CHEK GUIDE) w/Device KIT USE AS DIRECTED TO CHECK BLOOD GLUCOSE     busPIRone  (BUSPAR ) 15 MG tablet Take 15 mg by mouth 2 (two) times daily.     clobetasol  ointment (TEMOVATE ) 0.05 % Apply 1 Application topically 2 (two) times a week. 60 g 1   clonazePAM  (KLONOPIN ) 0.5 MG tablet Take 0.5 mg by mouth at bedtime.     DULoxetine  (CYMBALTA ) 60 MG capsule Take 120 mg by mouth at bedtime.     estradiol  (ESTRACE ) 1 MG tablet Take 1.5 tablets (1.5 mg total) by mouth daily. 135 tablet 3   JARDIANCE 25 MG TABS tablet Take 25 mg by mouth daily.     lisinopril  (ZESTRIL ) 20 MG tablet Take 20 mg by mouth  daily.     ondansetron  (ZOFRAN -ODT) 4 MG disintegrating tablet Take 4 mg by mouth 2 (two) times daily as needed for vomiting or nausea.     oxyCODONE  (OXY IR/ROXICODONE ) 5 MG immediate release tablet Take 1 tablet (5 mg total) by mouth every 4 (four) hours as needed for severe pain (pain score 7-10). 30 tablet 0   pantoprazole  (PROTONIX ) 40 MG tablet Take 1 tablet (40 mg total) by mouth daily. 90 tablet 3   rosuvastatin (CRESTOR) 20 MG tablet Take 20 mg by mouth daily.     sucralfate  (CARAFATE ) 1 g tablet Take 1 tablet (1 g total) by mouth 3 (three) times daily before meals. 90 tablet 2   traMADol  (ULTRAM ) 50 MG tablet Take 50 mg by mouth 2 (two) times daily as needed for moderate pain (pain score 4-6).     VENTOLIN  HFA 108 (90 Base) MCG/ACT inhaler Inhale 1-2 puffs into the lungs every 6 (six) hours as needed for wheezing or shortness of breath.     No current facility-administered medications for this visit.    Patient confirms/reports the following allergies:  Allergies  Allergen Reactions   Hydrocodone Itching   Aspirin  Other (See Comments)    Reaction:  GI upset     No orders of the defined types were placed in this encounter.   AUTHORIZATION INFORMATION Primary Insurance: 1D#: Group #:  Secondary Insurance: 1D#: Group #:  SCHEDULE INFORMATION: Date: 06/13/23 Time: Location: [facility]

## 2023-05-22 ENCOUNTER — Telehealth: Payer: Self-pay | Admitting: *Deleted

## 2023-05-22 ENCOUNTER — Telehealth: Payer: Self-pay

## 2023-05-22 ENCOUNTER — Other Ambulatory Visit: Payer: Self-pay

## 2023-05-22 NOTE — Telephone Encounter (Signed)
Patient has been scheduled for televisit.

## 2023-05-22 NOTE — Telephone Encounter (Signed)
 Patient has been scheduled for televisit med rec and consent done     Patient Consent for Virtual Visit         Brandy Robinson has provided verbal consent on 05/22/2023 for a virtual visit (video or telephone).   CONSENT FOR VIRTUAL VISIT FOR:  Brandy Robinson  By participating in this virtual visit I agree to the following:  I hereby voluntarily request, consent and authorize [facility] and its employed or contracted physicians, physician assistants, nurse practitioners or other licensed health care professionals (the Practitioner), to provide me with telemedicine health care services (the "Services") as deemed necessary by the treating Practitioner. I acknowledge and consent to receive the Services by the Practitioner via telemedicine. I understand that the telemedicine visit will involve communicating with the Practitioner through live audiovisual communication technology and the disclosure of certain medical information by electronic transmission. I acknowledge that I have been given the opportunity to request an in-person assessment or other available alternative prior to the telemedicine visit and am voluntarily participating in the telemedicine visit.  I understand that I have the right to withhold or withdraw my consent to the use of telemedicine in the course of my care at any time, without affecting my right to future care or treatment, and that the Practitioner or I may terminate the telemedicine visit at any time. I understand that I have the right to inspect all information obtained and/or recorded in the course of the telemedicine visit and may receive copies of available information for a reasonable fee.  I understand that some of the potential risks of receiving the Services via telemedicine include:  Delay or interruption in medical evaluation due to technological equipment failure or disruption; Information transmitted may not be sufficient (e.g. poor resolution of  images) to allow for appropriate medical decision making by the Practitioner; and/or  In rare instances, security protocols could fail, causing a breach of personal health information.  Furthermore, I acknowledge that it is my responsibility to provide information about my medical history, conditions and care that is complete and accurate to the best of my ability. I acknowledge that Practitioner's advice, recommendations, and/or decision may be based on factors not within their control, such as incomplete or inaccurate data provided by me or distortions of diagnostic images or specimens that may result from electronic transmissions. I understand that the practice of medicine is not an exact science and that Practitioner makes no warranties or guarantees regarding treatment outcomes. I acknowledge that a copy of this consent can be made available to me via my patient portal [facility] MyChart), or I can request a printed copy by calling the office of  [facility].    I understand that my insurance will be billed for this visit.   I have read or had this consent read to me. I understand the contents of this consent, which adequately explains the benefits and risks of the Services being provided via telemedicine.  I have been provided ample opportunity to ask questions regarding this consent and the Services and have had my questions answered to my satisfaction. I give my informed consent for the services to be provided through the use of telemedicine in my medical care

## 2023-05-22 NOTE — Telephone Encounter (Signed)
   Pre-operative Risk Assessment    Patient Name: Brandy Robinson  DOB: 1965/12/10 MRN: [id number]   Date of last office visit: 01/16/23 TELE PREOP APPT; 11/03/22 DR. HARDING IN OFFICE  Date of next office visit: NONE   Request for Surgical Clearance    Procedure:   COLONOSCOPY  Date of Surgery:  Clearance 06/13/23                                Surgeon:  DR. DARREN WOHL Surgeon's Group or Practice Name:  [facility] Phone number:  [phone] [address] Fax number:  [phone]   Type of Clearance Requested:   - Medical  - Pharmacy:  Hold    REQUEST STATES TO HOLD JARDIANCE x 3 DAYS; PT HAS BEEN ADVISED BY GI OFFICE OF RECOMMENDATIONS TO HOLD JARDIANCE    Type of Anesthesia:  General    Additional requests/questions:    Brandy Robinson   05/22/2023, 10:35 AM

## 2023-05-22 NOTE — Telephone Encounter (Signed)
 Primary Cardiologist:None   Preoperative team, please contact this patient and set up a phone call appointment for further preoperative risk assessment. Please obtain consent and complete medication review. Thank you for your help.   I confirm that guidance regarding antiplatelet and oral anticoagulation therapy has been completed and, if necessary, noted below (none requested).  I also confirmed the patient resides in the state of [place] . As per [facility] Medical Board telemedicine laws, the patient must reside in the state in which the provider is licensed.   Gerldine Koch, NP-C  05/22/2023, 2:46 PM [address], [address] [place], [place] [address] Office [phone] Fax [phone]

## 2023-05-23 ENCOUNTER — Ambulatory Visit: Admitting: Obstetrics and Gynecology

## 2023-05-24 ENCOUNTER — Other Ambulatory Visit: Payer: Self-pay

## 2023-05-24 DIAGNOSIS — N952 Postmenopausal atrophic vaginitis: Secondary | ICD-10-CM

## 2023-05-24 DIAGNOSIS — R87622 Low grade squamous intraepithelial lesion on cytologic smear of vagina (LGSIL): Secondary | ICD-10-CM

## 2023-05-24 NOTE — Progress Notes (Signed)
 Based off patient vaginal pap smear Dr. Luster Salters recommends a ref to Gyn/Onc for further review. Patient is aware of results and the referral, states she understands. Advised referral will be placed and she will be contacted to schedule.

## 2023-05-26 ENCOUNTER — Ambulatory Visit: Attending: Cardiology | Admitting: Student

## 2023-05-26 DIAGNOSIS — Z01810 Encounter for preprocedural cardiovascular examination: Secondary | ICD-10-CM | POA: Diagnosis not present

## 2023-05-26 NOTE — Progress Notes (Signed)
 Virtual Visit via Telephone Note   Because of Brandy Robinson's co-morbid illnesses, she is at least at moderate risk for complications without adequate follow up.  This format is felt to be most appropriate for this patient at this time.  The patient did not have access to video technology/had technical difficulties with video requiring transitioning to audio format only (telephone).  All issues noted in this document were discussed and addressed.  No physical exam could be performed with this format.  Please refer to the patient's chart for her consent to telehealth for [facility].  Evaluation Performed:  Preoperative cardiovascular risk assessment _____________   Date:  05/26/2023   Patient ID:  Brandy Robinson, DOB 11-Feb-1965, MRN [id number] Patient Location:  Home Provider location:   Office  Primary Care Provider:  Sharyne Degree, FNP Primary Cardiologist:  Randene Bustard, MD  Chief Complaint / Patient Profile   58 y.o. y/o female with a h/o hypertension, hyperlipidemia, T2DM who is pending colonoscopy by Dr. Ole Berkeley on 06/13/2023 and presents today for telephonic preoperative cardiovascular risk assessment.  History of Present Illness    Brandy Robinson is a 58 y.o. female who presents via audio/video conferencing for a telehealth visit today.  Pt was last seen in cardiology clinic on 11/03/2022 by Dr. Addie Holstein.  At that time Brandy Robinson was stable from a cardiac standpoint.  The patient is now pending procedure as outlined above. Since her last visit, she is doing well. Patient denies shortness of breath, dyspnea on exertion, lower extremity edema, orthopnea or PND. No chest pain, pressure, or tightness. No palpitations. She does not follow a formal exercise program. 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   Past Medical History    Past Medical History:  Diagnosis Date   Anxiety     Bipolar disorder (HCC)    COPD (chronic obstructive pulmonary disease) (HCC)    Depression    Diabetes mellitus without complication (HCC)    Disc disorder    Dyspareunia    Endometriosis    GERD (gastroesophageal reflux disease)    Insomnia    Lichen    Surgical menopause    Vaginal atrophy    Wears glasses    Past Surgical History:  Procedure Laterality Date   ABDOMINAL HYSTERECTOMY  01/17/1993   tah-bso   ARM WOUND REPAIR / CLOSURE  01/18/2011   cellulitis rt arm-i/d   KNEE BURSECTOMY Left 09/24/2013   Procedure: IRRIGATION AND DEBRIDEMENT OF LEFT KNEE ;  Surgeon: Saundra Curl, MD;  Location: [facility];  Service: Orthopedics;  Laterality: Left;   LAPAROTOMY N/A 12/30/2014   Procedure: EXPLORATORY LAPAROTOMY-graham patch of peptic ulcer;  Surgeon: Gwyndolyn Lerner, MD;  Location: [facility];  Service: General;  Laterality: N/A;   Transthoracic echo     TRANSTHORACIC ECHOCARDIOGRAM  03/25/2017   01/01/2015 [facility]): EF 50 to 55%.  Low normal range.  No RWMA.  Mild MR.;; 03/2017:: 03/22/2017 [facility]): EF 45 to 50%.  Mildly reduced.  No RWMA.  GR 1 DD.    Allergies  Allergies  Allergen Reactions   Hydrocodone Itching   Aspirin  Other (See Comments)    Reaction:  GI upset     Home Medications    Prior to Admission medications   Medication Sig Start Date End Date Taking? Authorizing Provider  ACCU-CHEK GUIDE test strip USE TO CHECK BLOOD SUGAR TWICE DAILY BEFORE  MEALS 09/27/22   [provider]  acetaminophen  (TYLENOL ) 500 MG tablet Take 2 tablets (1,000 mg total) by mouth every 6 (six) hours as needed for mild pain (pain score 1-3). 01/19/23   Emmalene Hare, MD  Blood Glucose Monitoring Suppl (ACCU-CHEK GUIDE) w/Device KIT USE AS DIRECTED TO CHECK BLOOD GLUCOSE 09/29/22   [provider]  busPIRone  (BUSPAR ) 15 MG tablet Take 15 mg by mouth 2 (two) times daily. 10/25/22   [provider]  clobetasol  ointment (TEMOVATE ) 0.05 % Apply 1  Application topically 2 (two) times a week. 05/18/23 07/11/24  Zenobia Hila, MD  clonazePAM  (KLONOPIN ) 0.5 MG tablet Take 0.5 mg by mouth at bedtime. 10/21/22   [provider]  DULoxetine  (CYMBALTA ) 60 MG capsule Take 120 mg by mouth at bedtime. 10/24/22   [provider]  estradiol  (ESTRACE ) 1 MG tablet Take 1.5 tablets (1.5 mg total) by mouth daily. 05/17/23 05/11/24  Zenobia Hila, MD  gabapentin  (NEURONTIN ) 100 MG capsule Take 100 mg by mouth 2 (two) times daily.    [provider]  JARDIANCE 25 MG TABS tablet Take 25 mg by mouth daily. 10/22/22   [provider]  lisinopril  (ZESTRIL ) 20 MG tablet Take 20 mg by mouth daily. 08/22/22   [provider]  metroNIDAZOLE  (FLAGYL ) 500 MG tablet Take 1 tablet (500 mg total) by mouth 2 (two) times daily. 05/18/23   Zenobia Hila, MD  ondansetron  (ZOFRAN -ODT) 4 MG disintegrating tablet Take 4 mg by mouth 2 (two) times daily as needed for vomiting or nausea. 10/17/22   [provider]  oxyCODONE  (OXY IR/ROXICODONE ) 5 MG immediate release tablet Take 1 tablet (5 mg total) by mouth every 4 (four) hours as needed for severe pain (pain score 7-10). 01/19/23   Emmalene Hare, MD  pantoprazole  (PROTONIX ) 40 MG tablet Take 1 tablet (40 mg total) by mouth daily. 12/07/22 12/02/23  Brigitte Canard, PA-C  rosuvastatin (CRESTOR) 20 MG tablet Take 20 mg by mouth daily. 10/24/22   [provider]  sucralfate  (CARAFATE ) 1 g tablet Take 1 tablet (1 g total) by mouth 3 (three) times daily before meals. 12/07/22 03/07/23  Brigitte Canard, PA-C  traMADol  (ULTRAM ) 50 MG tablet Take 50 mg by mouth 2 (two) times daily as needed for moderate pain (pain score 4-6). 10/29/22   [provider]  VENTOLIN  HFA 108 (90 Base) MCG/ACT inhaler Inhale 1-2 puffs into the lungs every 6 (six) hours as needed for wheezing or shortness of breath. 10/17/22   [provider]    Physical Exam    Vital Signs:  Brandy Robinson does not have vital signs available for review today.  Given telephonic nature of communication, physical exam is limited. AAOx3. NAD. Normal affect.  Speech and respirations are unlabored.   Assessment & Plan    Primary Cardiologist: Randene Bustard, MD  Preoperative cardiovascular risk assessment.  Colonoscopy by Dr. Ole Berkeley on 06/13/2023.  Chart reviewed as part of pre-operative protocol coverage. According to the RCRI, patient has a 0.4% risk of MACE. 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   Given past medical history and time since last visit, based on ACC/AHA guidelines, Brandy Robinson would be at acceptable risk for the planned procedure without further cardiovascular testing.   Patient was advised that if she develops new symptoms prior to surgery to contact our office to arrange a follow-up  appointment.  she verbalized understanding.  Jardiance prescribed by a noncardiology provider therefore recommendations for holding deferred to prescribing provider. PLEASE NOTE: Patient was recently started on Ozempic by PCP.    I will route this recommendation to the requesting party via Epic fax function.  Please call with questions.  Time:   Today, I have spent 5 minutes with the patient with telehealth technology discussing medical history, symptoms, and management plan.     Morey Ar, NP  05/26/2023, 7:55 AM

## 2023-05-30 ENCOUNTER — Ambulatory Visit
Admission: RE | Admit: 2023-05-30 | Discharge: 2023-05-30 | Disposition: A | Discharge status: Home / Self Care | Source: Ambulatory Visit | Attending: Obstetrics and Gynecology | Admitting: Obstetrics and Gynecology

## 2023-05-30 DIAGNOSIS — Z1231 Encounter for screening mammogram for malignant neoplasm of breast: Secondary | ICD-10-CM | POA: Insufficient documentation

## 2023-05-30 NOTE — Telephone Encounter (Signed)
 Preoperative cardiovascular risk assessment.  Colonoscopy by Dr. Ole Berkeley on 06/13/2023.   Chart reviewed as part of pre-operative protocol coverage. According to the RCRI, patient has a 0.4% risk of MACE. 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    Given past medical history and time since last visit, based on ACC/AHA guidelines, Brandy Robinson would be at acceptable risk for the planned procedure without further cardiovascular testing.    Patient was advised that if she develops new symptoms prior to surgery to contact our office to arrange a follow-up appointment.  she verbalized understanding.

## 2023-06-13 ENCOUNTER — Ambulatory Visit
Admission: RE | Admit: 2023-06-13 | Discharge: 2023-06-13 | Disposition: A | Discharge status: Home / Self Care | Attending: Gastroenterology | Admitting: Gastroenterology

## 2023-06-13 ENCOUNTER — Encounter
Admission: RE | Disposition: A | Discharge status: Home / Self Care | Payer: Self-pay | Source: Home / Self Care | Attending: Gastroenterology

## 2023-06-13 ENCOUNTER — Ambulatory Visit: Admitting: Certified Registered"

## 2023-06-13 ENCOUNTER — Encounter: Payer: Self-pay | Admitting: Gastroenterology

## 2023-06-13 DIAGNOSIS — F17210 Nicotine dependence, cigarettes, uncomplicated: Secondary | ICD-10-CM | POA: Diagnosis not present

## 2023-06-13 DIAGNOSIS — D120 Benign neoplasm of cecum: Secondary | ICD-10-CM | POA: Diagnosis not present

## 2023-06-13 DIAGNOSIS — J449 Chronic obstructive pulmonary disease, unspecified: Secondary | ICD-10-CM | POA: Diagnosis not present

## 2023-06-13 DIAGNOSIS — Z1211 Encounter for screening for malignant neoplasm of colon: Secondary | ICD-10-CM | POA: Diagnosis present

## 2023-06-13 DIAGNOSIS — K219 Gastro-esophageal reflux disease without esophagitis: Secondary | ICD-10-CM | POA: Insufficient documentation

## 2023-06-13 DIAGNOSIS — F419 Anxiety disorder, unspecified: Secondary | ICD-10-CM | POA: Diagnosis not present

## 2023-06-13 DIAGNOSIS — D123 Benign neoplasm of transverse colon: Secondary | ICD-10-CM | POA: Diagnosis not present

## 2023-06-13 DIAGNOSIS — Z7984 Long term (current) use of oral hypoglycemic drugs: Secondary | ICD-10-CM | POA: Diagnosis not present

## 2023-06-13 DIAGNOSIS — E1140 Type 2 diabetes mellitus with diabetic neuropathy, unspecified: Secondary | ICD-10-CM | POA: Insufficient documentation

## 2023-06-13 DIAGNOSIS — K635 Polyp of colon: Secondary | ICD-10-CM

## 2023-06-13 DIAGNOSIS — F319 Bipolar disorder, unspecified: Secondary | ICD-10-CM | POA: Insufficient documentation

## 2023-06-13 LAB — GLUCOSE, CAPILLARY: Glucose-Capillary: 146 mg/dL — ABNORMAL HIGH (ref 70–99)

## 2023-06-13 SURGERY — COLONOSCOPY
Anesthesia: General

## 2023-06-13 MED ORDER — DEXMEDETOMIDINE HCL IN NACL 80 MCG/20ML IV SOLN
INTRAVENOUS | Status: AC
  Filled 2023-06-13: qty 20

## 2023-06-13 MED ORDER — PROPOFOL 10 MG/ML IV BOLUS
INTRAVENOUS | Status: DC | PRN
  Administered 2023-06-13: 70 mg via INTRAVENOUS

## 2023-06-13 MED ORDER — LIDOCAINE HCL (PF) 2 % IJ SOLN
INTRAMUSCULAR | Status: AC
  Filled 2023-06-13: qty 25

## 2023-06-13 MED ORDER — SODIUM CHLORIDE 0.9 % IV SOLN: INTRAVENOUS | Status: DC

## 2023-06-13 MED ORDER — LIDOCAINE HCL (CARDIAC) PF 100 MG/5ML IV SOSY
PREFILLED_SYRINGE | INTRAVENOUS | Status: DC | PRN
  Administered 2023-06-13: 60 mg via INTRAVENOUS

## 2023-06-13 MED ORDER — PROPOFOL 500 MG/50ML IV EMUL
INTRAVENOUS | Status: DC | PRN
  Administered 2023-06-13: 150 ug/kg/min via INTRAVENOUS

## 2023-06-13 NOTE — Transfer of Care (Signed)
 Immediate Anesthesia Transfer of Care Note  Patient: MAHALIE KANNER  Procedure(s) Performed: COLONOSCOPY  Patient Location: Endoscopy Unit  Anesthesia Type:General  Level of Consciousness: drowsy and patient cooperative  Airway & Oxygen Therapy: Patient Spontanous Breathing and Patient connected to nasal cannula oxygen  Post-op Assessment: Report given to RN, Post -op Vital signs reviewed and stable, and Patient moving all extremities X 4  Post vital signs: Reviewed and stable  Last Vitals:  Vitals Value Taken Time  BP 83/37 06/13/23 0843  Temp    Pulse 92 06/13/23 0843  Resp 20 06/13/23 0843  SpO2 97 % 06/13/23 0843  Vitals shown include unfiled device data.  Last Pain:  Vitals:   06/13/23 0718  TempSrc: Temporal  PainSc: 0-No pain         Complications: No notable events documented.

## 2023-06-13 NOTE — Anesthesia Postprocedure Evaluation (Signed)
 Anesthesia Post Note  Patient: Brandy Robinson  Procedure(s) Performed: COLONOSCOPY  Patient location during evaluation: Endoscopy Anesthesia Type: General Level of consciousness: awake and alert Pain management: pain level controlled Vital Signs Assessment: post-procedure vital signs reviewed and stable Respiratory status: spontaneous breathing, nonlabored ventilation, respiratory function stable and patient connected to nasal cannula oxygen Cardiovascular status: blood pressure returned to baseline and stable Postop Assessment: no apparent nausea or vomiting Anesthetic complications: no   No notable events documented.   Last Vitals:  Vitals:   06/13/23 0853 06/13/23 0903  BP: 120/62 (!) 133/57  Pulse:    Resp:    Temp:    SpO2:      Last Pain:  Vitals:   06/13/23 0903  TempSrc:   PainSc: 0-No pain                 Vanice Genre

## 2023-06-13 NOTE — Op Note (Signed)
 [facility] Patient Name: Brandy Robinson Procedure Date: 06/13/2023 8:03 AM MRN: [id number] Account #: [id number] Date of Birth: 1965-06-17 Admit Type: Outpatient Age: 58 Room: [facility] ENDO ROOM 4 Gender: Female Note Status: Finalized Instrument Name: Charlyn Cooley [id number] Procedure:             Colonoscopy Indications:           Screening for colorectal malignant neoplasm Providers:             Marnee Sink MD, MD Referring MD:          Argentina Kugel. Rozetta Corns (Referring MD) Medicines:             Propofol  per Anesthesia Complications:         No immediate complications. Procedure:             Pre-Anesthesia Assessment:                        - Prior to the procedure, a History and Physical was                         performed, and patient medications and allergies were                         reviewed. The patient's tolerance of previous                         anesthesia was also reviewed. The risks and benefits                         of the procedure and the sedation options and risks                         were discussed with the patient. All questions were                         answered, and informed consent was obtained. Prior                         Anticoagulants: The patient has taken no anticoagulant                         or antiplatelet agents. ASA Grade Assessment: II - A                         patient with mild systemic disease. After reviewing                         the risks and benefits, the patient was deemed in                         satisfactory condition to undergo the procedure.                        After obtaining informed consent, the colonoscope was                         passed under direct vision. Throughout the procedure,  the patient's blood pressure, pulse, and oxygen                         saturations were monitored continuously. The                         Colonoscope was introduced through  the anus and                         advanced to the the cecum, identified by appendiceal                         orifice and ileocecal valve. The colonoscopy was                         performed without difficulty. The patient tolerated                         the procedure well. The quality of the bowel                         preparation was poor. Findings:      The perianal and digital rectal examinations were normal.      A polyp was found in the cecum. The polyp was sessile. Preparations were       made for mucosal resection. Chromoscopy with indigo carmine was done.       Demarcation of the lesion was performed during the procedure to clearly       identify the boundaries of the lesion. Saline with indigo carmine was       injected to raise the lesion. Snare mucosal resection was performed.       Resection and retrieval were complete. Resected tissue margins were       examined and clear of polyp tissue. To close a defect after polypectomy,       one hemostatic clip was successfully placed (MR conditional). Clip       manufacturer: [employer]. There was no bleeding at the end of the       procedure.      Three sessile polyps were found in the transverse colon. The polyps were       2 to 6 mm in size. These polyps were removed with a cold snare.       Resection and retrieval were complete. Impression:            - Preparation of the colon was poor.                        - One polyp in the cecum, removed with mucosal                         resection. Resected and retrieved. Clip (MR                         conditional) was placed. Clip manufacturer: [employer].                        - Three  2 to 6 mm polyps in the transverse colon,                         removed with a cold snare. Resected and retrieved.                        - Mucosal resection was performed. Resection and                         retrieval were complete. Recommendation:         - Discharge patient to home.                        - Resume previous diet.                        - Continue present medications.                        - Await pathology results.                        - Repeat colonoscopy in 1 year because the bowel                         preparation was poor. Procedure Code(s):     --- Professional ---                        [phone], Colonoscopy, flexible; with endoscopic mucosal                         resection                        [id number], 59, Colonoscopy, flexible; with removal of                         tumor(s), polyp(s), or other lesion(s) by snare                         technique Diagnosis Code(s):     --- Professional ---                        Z12.11, Encounter for screening for malignant neoplasm                         of colon                        D12.0, Benign neoplasm of cecum CPT copyright 2022 American Medical Association. All rights reserved. The codes documented in this report are preliminary and upon coder review may  be revised to meet current compliance requirements. Marnee Sink MD, MD 06/13/2023 8:42:34 AM This report has been signed electronically. Number of Addenda: 0 Note Initiated On: 06/13/2023 8:03 AM Scope Withdrawal Time: 0 hours 14 minutes 13 seconds  Total Procedure Duration: 0 hours 23 minutes 49 seconds  Estimated Blood Loss:  Estimated blood loss: none.      [facility]

## 2023-06-13 NOTE — Anesthesia Preprocedure Evaluation (Signed)
 Anesthesia Evaluation  Patient identified by MRN, date of birth, ID band Patient awake    Reviewed: Allergy & Precautions, H&P , NPO status , Patient's Chart, lab work & pertinent test results, reviewed documented beta blocker date and time   History of Anesthesia Complications Negative for: history of anesthetic complications  Airway Mallampati: II  TM Distance: >3 FB Neck ROM: full    Dental  (+) Dental Advidsory Given, Edentulous Upper, Poor Dentition   Pulmonary neg shortness of breath, neg sleep apnea, COPD, neg recent URI, Current Smoker   Pulmonary exam normal breath sounds clear to auscultation       Cardiovascular Exercise Tolerance: Good hypertension, (-) angina (-) Past MI and (-) Cardiac Stents Normal cardiovascular exam(-) dysrhythmias (-) Valvular Problems/Murmurs Rhythm:regular Rate:Normal     Neuro/Psych neg Seizures PSYCHIATRIC DISORDERS Anxiety Depression Bipolar Disorder    Neuromuscular disease (neuropathy)    GI/Hepatic Neg liver ROS,GERD  ,,  Endo/Other  diabetes    Renal/GU negative Renal ROS  negative genitourinary   Musculoskeletal   Abdominal   Peds  Hematology negative hematology ROS (+)   Anesthesia Other Findings Past Medical History: No date: Anxiety No date: Bipolar disorder (HCC) No date: COPD (chronic obstructive pulmonary disease) (HCC) No date: Depression No date: Diabetes mellitus without complication (HCC) No date: Disc disorder No date: Dyspareunia No date: Endometriosis No date: GERD (gastroesophageal reflux disease) No date: Insomnia No date: Lichen No date: Surgical menopause No date: Vaginal atrophy No date: Wears glasses   Reproductive/Obstetrics negative OB ROS                             Anesthesia Physical Anesthesia Plan  ASA: 3  Anesthesia Plan: General   Post-op Pain Management:    Induction: Intravenous  PONV Risk Score  and Plan: 2 and Propofol  infusion, TIVA and Treatment may vary due to age or medical condition  Airway Management Planned: Natural Airway and Nasal Cannula  Additional Equipment:   Intra-op Plan:   Post-operative Plan:   Informed Consent: I have reviewed the patients History and Physical, chart, labs and discussed the procedure including the risks, benefits and alternatives for the proposed anesthesia with the patient or authorized representative who has indicated his/her understanding and acceptance.     Dental Advisory Given  Plan Discussed with: Anesthesiologist, CRNA and Surgeon  Anesthesia Plan Comments:        Anesthesia Quick Evaluation

## 2023-06-13 NOTE — H&P (Signed)
 Marnee Sink, MD [facility] [address] [place], [place] [address] Phone: [phone] Fax : [phone]  Primary Care Physician:  Sharyne Degree, FNP Primary Gastroenterologist:  Dr. Ole Berkeley  Pre-Procedure History & Physical: HPI:  Brandy Robinson is a 58 y.o. female is here for a screening colonoscopy.   Past Medical History:  Diagnosis Date   Anxiety    Bipolar disorder (HCC)    COPD (chronic obstructive pulmonary disease) (HCC)    Depression    Diabetes mellitus without complication (HCC)    Disc disorder    Dyspareunia    Endometriosis    GERD (gastroesophageal reflux disease)    Insomnia    Lichen    Surgical menopause    Vaginal atrophy    Wears glasses     Past Surgical History:  Procedure Laterality Date   ABDOMINAL HYSTERECTOMY  01/17/1993   tah-bso   ARM WOUND REPAIR / CLOSURE  01/18/2011   cellulitis rt arm-i/d   CHOLECYSTECTOMY  02/2023   KNEE BURSECTOMY Left 09/24/2013   Procedure: IRRIGATION AND DEBRIDEMENT OF LEFT KNEE ;  Surgeon: Saundra Curl, MD;  Location: [facility];  Service: Orthopedics;  Laterality: Left;   LAPAROTOMY N/A 12/30/2014   Procedure: EXPLORATORY LAPAROTOMY-graham patch of peptic ulcer;  Surgeon: Gwyndolyn Lerner, MD;  Location: [facility];  Service: General;  Laterality: N/A;   Transthoracic echo     TRANSTHORACIC ECHOCARDIOGRAM  03/25/2017   01/01/2015 [facility]): EF 50 to 55%.  Low normal range.  No RWMA.  Mild MR.;; 03/2017:: 03/22/2017 [facility]): EF 45 to 50%.  Mildly reduced.  No RWMA.  GR 1 DD.    Prior to Admission medications   Medication Sig Start Date End Date Taking? Authorizing Provider  gabapentin  (NEURONTIN ) 100 MG capsule Take 100 mg by mouth 2 (two) times daily.   Yes [provider]  Semaglutide (OZEMPIC, 0.25 OR 0.5 MG/DOSE, [place]) Inject into the skin once a week.   Yes [provider]  ACCU-CHEK GUIDE test strip USE TO CHECK BLOOD SUGAR TWICE DAILY BEFORE MEALS 09/27/22   [provider]  acetaminophen  (TYLENOL ) 500 MG tablet Take 2 tablets (1,000 mg total) by mouth every 6 (six) hours as needed for mild pain (pain score 1-3). 01/19/23   Emmalene Hare, MD  Blood Glucose Monitoring Suppl (ACCU-CHEK GUIDE) w/Device KIT USE AS DIRECTED TO CHECK BLOOD GLUCOSE 09/29/22   [provider]  busPIRone  (BUSPAR ) 15 MG tablet Take 15 mg by mouth 2 (two) times daily. 10/25/22   [provider]  clobetasol  ointment (TEMOVATE ) 0.05 % Apply 1 Application topically 2 (two) times a week. 05/18/23 07/11/24  Zenobia Hila, MD  clonazePAM  (KLONOPIN ) 0.5 MG tablet Take 0.5 mg by mouth at bedtime. 10/21/22   [provider]  DULoxetine  (CYMBALTA ) 60 MG capsule Take 120 mg by mouth at bedtime. 10/24/22   [provider]  estradiol  (ESTRACE ) 1 MG tablet Take 1.5 tablets (1.5 mg total) by mouth daily. 05/17/23 05/11/24  Zenobia Hila, MD  JARDIANCE 25 MG TABS tablet Take 25 mg by mouth daily. 10/22/22   [provider]  lisinopril  (ZESTRIL ) 20 MG tablet Take 20 mg by mouth daily. 08/22/22   [provider]  metroNIDAZOLE  (FLAGYL ) 500 MG tablet Take 1 tablet (500 mg total) by mouth 2 (two) times daily. 05/18/23   Zenobia Hila, MD  ondansetron  (ZOFRAN -ODT) 4 MG disintegrating tablet Take 4 mg by mouth 2 (two) times daily as needed  for vomiting or nausea. 10/17/22   [provider]  oxyCODONE  (OXY IR/ROXICODONE ) 5 MG immediate release tablet Take 1 tablet (5 mg total) by mouth every 4 (four) hours as needed for severe pain (pain score 7-10). 01/19/23   Emmalene Hare, MD  pantoprazole  (PROTONIX ) 40 MG tablet Take 1 tablet (40 mg total) by mouth daily. 12/07/22 12/02/23  Brigitte Canard, PA-C  rosuvastatin (CRESTOR) 20 MG tablet Take 20 mg by mouth daily. 10/24/22   [provider]  sucralfate  (CARAFATE ) 1 g tablet Take 1 tablet (1 g total) by mouth 3 (three) times daily before meals. 12/07/22 03/07/23  Brigitte Canard, PA-C  traMADol   (ULTRAM ) 50 MG tablet Take 50 mg by mouth 2 (two) times daily as needed for moderate pain (pain score 4-6). 10/29/22   [provider]  VENTOLIN  HFA 108 (90 Base) MCG/ACT inhaler Inhale 1-2 puffs into the lungs every 6 (six) hours as needed for wheezing or shortness of breath. 10/17/22   [provider]    Allergies as of 05/18/2023 - Review Complete 05/18/2023  Allergen Reaction Noted   Hydrocodone Itching 09/24/2013   Aspirin  Other (See Comments) 08/25/2013    Family History  Problem Relation Age of Onset   Diabetes Mother    Diabetes Sister    Cancer Father        Throat   Heart disease Neg Hx    Breast cancer Neg Hx    Ovarian cancer Neg Hx    Colon cancer Neg Hx     Social History   Socioeconomic History   Marital status: Married    Spouse name: Not on file   Number of children: Not on file   Years of education: Not on file   Highest education level: Not on file  Occupational History   Not on file  Tobacco Use   Smoking status: Every Day    Current packs/day: 0.00    Types: Cigarettes    Last attempt to quit: 10/26/2020    Years since quitting: 2.6   Smokeless tobacco: Former    Quit date: 03/17/2020   Tobacco comments:    1800quit now  Vaping Use   Vaping status: Never Used  Substance and Sexual Activity   Alcohol use: No   Drug use: No   Sexual activity: Yes    Birth control/protection: Surgical  Other Topics Concern   Not on file  Social History Narrative   Lives at home with husband.   Social Drivers of Corporate investment banker Strain: Low Risk  (11/14/2019)   Received from [facility], [facility]   Overall Financial Resource Strain (CARDIA)    Difficulty of Paying Living Expenses: Not hard at all  Food Insecurity: No Food Insecurity (11/14/2019)   Received from [facility], [facility]   Hunger Vital Sign    Worried About Running Out of Food in the Last Year: Never true    Ran Out of Food in the Last Year:  Never true  Transportation Needs: No Transportation Needs (11/14/2019)   Received from [facility], [facility]   [facility] - Transportation    Lack of Transportation (Medical): No    Lack of Transportation (Non-Medical): No  Physical Activity: Sufficiently Active (05/18/2017)   Exercise Vital Sign    Days of Exercise per Week: 5 days    Minutes of Exercise per Session: 30 min  Stress: Not on file  Social Connections: Not on file  Intimate Partner Violence: Not on file    Review of Systems: See HPI, otherwise negative ROS  Physical Exam: BP 137/74   Pulse 98   Temp (!) 96.3 F (35.7 C) (Temporal)   Resp 18   Ht 5\' 3"  (1.6 m)   Wt 67.6 kg   SpO2 98%   BMI 26.39 kg/m  General:   Alert,  pleasant and cooperative in NAD Head:  Normocephalic and atraumatic. Neck:  Supple; no masses or thyromegaly. Lungs:  Clear throughout to auscultation.    Heart:  Regular rate and rhythm. Abdomen:  Soft, nontender and nondistended. Normal bowel sounds, without guarding, and without rebound.   Neurologic:  Alert and  oriented x4;  grossly normal neurologically.  Impression/Plan: Brandy Robinson is now here to undergo a screening colonoscopy.  Risks, benefits, and alternatives regarding colonoscopy have been reviewed with the patient.  Questions have been answered.  All parties agreeable.

## 2023-06-14 ENCOUNTER — Inpatient Hospital Stay: Attending: Obstetrics and Gynecology | Admitting: Obstetrics and Gynecology

## 2023-06-14 ENCOUNTER — Inpatient Hospital Stay

## 2023-06-14 ENCOUNTER — Encounter: Payer: Self-pay | Admitting: Obstetrics and Gynecology

## 2023-06-14 ENCOUNTER — Ambulatory Visit: Payer: Self-pay | Admitting: Nurse Practitioner

## 2023-06-14 VITALS — BP 115/67 | HR 93 | Temp 98.6°F | Resp 20 | Wt 156.7 lb

## 2023-06-14 DIAGNOSIS — Z79890 Hormone replacement therapy (postmenopausal): Secondary | ICD-10-CM | POA: Insufficient documentation

## 2023-06-14 DIAGNOSIS — F319 Bipolar disorder, unspecified: Secondary | ICD-10-CM | POA: Insufficient documentation

## 2023-06-14 DIAGNOSIS — Z90722 Acquired absence of ovaries, bilateral: Secondary | ICD-10-CM | POA: Diagnosis not present

## 2023-06-14 DIAGNOSIS — R87629 Unspecified abnormal cytological findings in specimens from vagina: Secondary | ICD-10-CM

## 2023-06-14 DIAGNOSIS — Z7985 Long-term (current) use of injectable non-insulin antidiabetic drugs: Secondary | ICD-10-CM | POA: Diagnosis not present

## 2023-06-14 DIAGNOSIS — Z800 Family history of malignant neoplasm of digestive organs: Secondary | ICD-10-CM | POA: Diagnosis not present

## 2023-06-14 DIAGNOSIS — K219 Gastro-esophageal reflux disease without esophagitis: Secondary | ICD-10-CM | POA: Insufficient documentation

## 2023-06-14 DIAGNOSIS — I38 Endocarditis, valve unspecified: Secondary | ICD-10-CM | POA: Diagnosis not present

## 2023-06-14 DIAGNOSIS — R87622 Low grade squamous intraepithelial lesion on cytologic smear of vagina (LGSIL): Secondary | ICD-10-CM | POA: Insufficient documentation

## 2023-06-14 DIAGNOSIS — Z79899 Other long term (current) drug therapy: Secondary | ICD-10-CM | POA: Diagnosis not present

## 2023-06-14 DIAGNOSIS — E8941 Symptomatic postprocedural ovarian failure: Secondary | ICD-10-CM | POA: Diagnosis not present

## 2023-06-14 DIAGNOSIS — F17210 Nicotine dependence, cigarettes, uncomplicated: Secondary | ICD-10-CM | POA: Insufficient documentation

## 2023-06-14 DIAGNOSIS — E119 Type 2 diabetes mellitus without complications: Secondary | ICD-10-CM | POA: Diagnosis not present

## 2023-06-14 DIAGNOSIS — Z90710 Acquired absence of both cervix and uterus: Secondary | ICD-10-CM | POA: Insufficient documentation

## 2023-06-14 DIAGNOSIS — F411 Generalized anxiety disorder: Secondary | ICD-10-CM | POA: Diagnosis not present

## 2023-06-14 DIAGNOSIS — Z8041 Family history of malignant neoplasm of ovary: Secondary | ICD-10-CM | POA: Insufficient documentation

## 2023-06-14 DIAGNOSIS — I10 Essential (primary) hypertension: Secondary | ICD-10-CM | POA: Diagnosis not present

## 2023-06-14 DIAGNOSIS — E785 Hyperlipidemia, unspecified: Secondary | ICD-10-CM | POA: Diagnosis not present

## 2023-06-14 DIAGNOSIS — Z9079 Acquired absence of other genital organ(s): Secondary | ICD-10-CM | POA: Insufficient documentation

## 2023-06-14 DIAGNOSIS — G629 Polyneuropathy, unspecified: Secondary | ICD-10-CM | POA: Insufficient documentation

## 2023-06-14 DIAGNOSIS — J449 Chronic obstructive pulmonary disease, unspecified: Secondary | ICD-10-CM | POA: Diagnosis not present

## 2023-06-14 DIAGNOSIS — N393 Stress incontinence (female) (male): Secondary | ICD-10-CM | POA: Diagnosis not present

## 2023-06-14 DIAGNOSIS — Z1151 Encounter for screening for human papillomavirus (HPV): Secondary | ICD-10-CM | POA: Insufficient documentation

## 2023-06-14 DIAGNOSIS — N898 Other specified noninflammatory disorders of vagina: Secondary | ICD-10-CM

## 2023-06-14 LAB — WET PREP, GENITAL
Clue Cells Wet Prep HPF POC: NONE SEEN
Sperm: NONE SEEN
Trich, Wet Prep: NONE SEEN
WBC, Wet Prep HPF POC: >=10 — AB (ref <10)
Yeast Wet Prep HPF POC: NONE SEEN

## 2023-06-14 LAB — SURGICAL PATHOLOGY

## 2023-06-14 NOTE — Progress Notes (Signed)
 Gynecologic Oncology Consult Visit   Referring Provider: Dr. Luster Salters  Chief Complaint:   Subjective:  Brandy Robinson is a 58 y.o. female who is seen in consultation from Dr. Luster Salters for LSIL pap with history of lichen sclerosus.   She had a total hysterectomy at age 26 for endometriosis. She has been on HRT since. Some time after her hysterectomy she endorses sharp, sudden pain during intercourse that resolved without intervention.   More recently, she was seen on 05/17/23 for well woman exam by Dr Luster Salters. On exam, he noted redness of her vaginal cuff. Diagnostic vaginal pap was obtained.    05/17/23 - LSIL, HPV negative.   She denies history of abnormal pap. Not currently sexually active. Endorses recent BV and recent discharge.   Problem List: Patient Active Problem List   Diagnosis Date Noted   Polyp of colon 06/13/2023   Encounter for screening colonoscopy 06/13/2023   Symptomatic cholelithiasis 01/19/2023   Valvular heart disease 11/03/2022   Current smoker 11/03/2022   Hyperlipidemia associated with type 2 diabetes mellitus (HCC) 11/03/2022   Essential hypertension 11/03/2022   Peripheral neuropathy 11/03/2022   Healthcare maintenance 11/03/2022   Sepsis (HCC) 02/25/2021   Encounter for medication management 10/08/2018   SUI (stress urinary incontinence, female) 05/19/2017   Generalized anxiety disorder 03/21/2017   CAP (community acquired pneumonia) 03/18/2017   Acute respiratory failure (HCC)    Perforated viscus 12/30/2014   Lichen sclerosus 09/24/2014   Surgical menopause 09/24/2014   Endometriosis 09/24/2014   Bipolar 1 disorder (HCC) 09/24/2014   Insomnia 09/24/2014   GERD (gastroesophageal reflux disease) 09/24/2014   History of stomach ulcers 09/24/2014    Past Medical History: Past Medical History:  Diagnosis Date   Anxiety    Bipolar disorder (HCC)    COPD (chronic obstructive pulmonary disease) (HCC)    Depression    Diabetes mellitus without  complication (HCC)    Disc disorder    Dyspareunia    Endometriosis    GERD (gastroesophageal reflux disease)    Insomnia    Lichen    Surgical menopause    Vaginal atrophy    Wears glasses     Past Surgical History: Past Surgical History:  Procedure Laterality Date   ABDOMINAL HYSTERECTOMY  01/17/1993   tah-bso   ARM WOUND REPAIR / CLOSURE  01/18/2011   cellulitis rt arm-i/d   CHOLECYSTECTOMY  02/2023   COLONOSCOPY N/A 06/13/2023   Procedure: COLONOSCOPY;  Surgeon: Marnee Sink, MD;  Location: [facility];  Service: Endoscopy;  Laterality: N/A;   KNEE BURSECTOMY Left 09/24/2013   Procedure: IRRIGATION AND DEBRIDEMENT OF LEFT KNEE ;  Surgeon: Saundra Curl, MD;  Location: [facility];  Service: Orthopedics;  Laterality: Left;   LAPAROTOMY N/A 12/30/2014   Procedure: EXPLORATORY LAPAROTOMY-graham patch of peptic ulcer;  Surgeon: Gwyndolyn Lerner, MD;  Location: [facility];  Service: General;  Laterality: N/A;   Transthoracic echo     TRANSTHORACIC ECHOCARDIOGRAM  03/25/2017   01/01/2015 [facility]): EF 50 to 55%.  Low normal range.  No RWMA.  Mild MR.;; 03/2017:: 03/22/2017 [facility]): EF 45 to 50%.  Mildly reduced.  No RWMA.  GR 1 DD.    Past Gynecologic History:  Menarche: 15 Menstrual details: Lasts 7 days,  Last Menstrual Period: s/p hysterectomy History of Abnormal pap: yes, LGSIL Last pap: per HPI Sexually active: no  OB History:  OB History  Gravida Para Term Preterm AB Living  1 1 1   1   SAB  IAB Ectopic Multiple Live Births      1    # Outcome Date GA Lbr Len/2nd Weight Sex Type Anes PTL Lv  1 Term 71    M Vag-Spont   LIV    Family History: Family History  Problem Relation Age of Onset   Diabetes Mother    Diabetes Sister    Cancer Father        Throat   Heart disease Neg Hx    Breast cancer Neg Hx    Ovarian cancer Neg Hx    Colon cancer Neg Hx     Social History: Social History   Socioeconomic History   Marital status: Married     Spouse name: Not on file   Number of children: Not on file   Years of education: Not on file   Highest education level: Not on file  Occupational History   Not on file  Tobacco Use   Smoking status: Every Day    Current packs/day: 0.00    Types: Cigarettes    Last attempt to quit: 10/26/2020    Years since quitting: 2.6   Smokeless tobacco: Former    Quit date: 03/17/2020   Tobacco comments:    1800quit now  Vaping Use   Vaping status: Never Used  Substance and Sexual Activity   Alcohol use: No   Drug use: No   Sexual activity: Yes    Birth control/protection: Surgical  Other Topics Concern   Not on file  Social History Narrative   Lives at home with husband.   Social Drivers of Corporate investment banker Strain: Low Risk  (11/14/2019)   Received from [facility], [facility]   Overall Financial Resource Strain (CARDIA)    Difficulty of Paying Living Expenses: Not hard at all  Food Insecurity: No Food Insecurity (06/14/2023)   Hunger Vital Sign    Worried About Running Out of Food in the Last Year: Never true    Ran Out of Food in the Last Year: Never true  Transportation Needs: No Transportation Needs (06/14/2023)   PRAPARE - Administrator, Civil Service (Medical): No    Lack of Transportation (Non-Medical): No  Physical Activity: Sufficiently Active (05/18/2017)   Exercise Vital Sign    Days of Exercise per Week: 5 days    Minutes of Exercise per Session: 30 min  Stress: Not on file  Social Connections: Not on file  Intimate Partner Violence: Not At Risk (06/14/2023)   Humiliation, Afraid, Rape, and Kick questionnaire    Fear of Current or Ex-Partner: No    Emotionally Abused: No    Physically Abused: No    Sexually Abused: No    Allergies: Allergies  Allergen Reactions   Hydrocodone Itching   Aspirin  Other (See Comments)    Reaction:  GI upset     Current Medications: Current Outpatient Medications  Medication Sig Dispense Refill    ACCU-CHEK GUIDE test strip USE TO CHECK BLOOD SUGAR TWICE DAILY BEFORE MEALS     Blood Glucose Monitoring Suppl (ACCU-CHEK GUIDE) w/Device KIT USE AS DIRECTED TO CHECK BLOOD GLUCOSE     busPIRone  (BUSPAR ) 15 MG tablet Take 15 mg by mouth 2 (two) times daily.     clobetasol  ointment (TEMOVATE ) 0.05 % Apply 1 Application topically 2 (two) times a week. 60 g 1   clonazePAM  (KLONOPIN ) 0.5 MG tablet Take 0.5 mg by mouth at bedtime.     DULoxetine  (CYMBALTA ) 60 MG  capsule Take 120 mg by mouth at bedtime.     estradiol  (ESTRACE ) 1 MG tablet Take 1.5 tablets (1.5 mg total) by mouth daily. 135 tablet 3   gabapentin  (NEURONTIN ) 100 MG capsule Take 100 mg by mouth 2 (two) times daily.     JARDIANCE 25 MG TABS tablet Take 25 mg by mouth daily.     lisinopril  (ZESTRIL ) 20 MG tablet Take 20 mg by mouth daily.     ondansetron  (ZOFRAN -ODT) 4 MG disintegrating tablet Take 4 mg by mouth 2 (two) times daily as needed for vomiting or nausea.     pantoprazole  (PROTONIX ) 40 MG tablet Take 1 tablet (40 mg total) by mouth daily. 90 tablet 3   rosuvastatin (CRESTOR) 20 MG tablet Take 20 mg by mouth daily.     Semaglutide (OZEMPIC, 0.25 OR 0.5 MG/DOSE, ) Inject into the skin once a week.     sucralfate  (CARAFATE ) 1 g tablet Take 1 tablet (1 g total) by mouth 3 (three) times daily before meals. 90 tablet 2   traMADol  (ULTRAM ) 50 MG tablet Take 50 mg by mouth 2 (two) times daily as needed for moderate pain (pain score 4-6).     VENTOLIN  HFA 108 (90 Base) MCG/ACT inhaler Inhale 1-2 puffs into the lungs every 6 (six) hours as needed for wheezing or shortness of breath.     acetaminophen  (TYLENOL ) 500 MG tablet Take 2 tablets (1,000 mg total) by mouth every 6 (six) hours as needed for mild pain (pain score 1-3).     metroNIDAZOLE  (FLAGYL ) 500 MG tablet Take 1 tablet (500 mg total) by mouth 2 (two) times daily. 14 tablet 0   oxyCODONE  (OXY IR/ROXICODONE ) 5 MG immediate release tablet Take 1 tablet (5 mg total) by mouth every 4  (four) hours as needed for severe pain (pain score 7-10). 30 tablet 0   No current facility-administered medications for this visit.    Review of Systems General: positive for weight loss; negative for fevers, or night sweats Skin: negative for changes in moles or sores or rash Eyes: negative for changes in vision HEENT: negative for change in hearing, tinnitus, voice changes Pulmonary: negative for dyspnea, orthopnea, productive cough, wheezing Cardiac: negative for palpitations, pain Gastrointestinal: positive for nausea, constipation, diarrhea; negative for vomiting, hematemesis, hematochezia. S/p recent colonoscopy, biopsies pending Genitourinary/Sexual: negative for dysuria, retention, hematuria, incontinence Ob/Gyn:  negative for abnormal bleeding, or pain Musculoskeletal: negative for pain, joint pain, back pain Hematology: negative for easy bruising, abnormal bleeding Neurologic/Psych: negative for headaches, seizures, paralysis, weakness, numbness    Objective:  Physical Examination:  BP 115/67   Pulse 93   Temp 98.6 F (37 C)   Resp 20   Wt 156 lb 11.2 oz (71.1 kg)   SpO2 100%   BMI 27.76 kg/m     ECOG Performance Status: 0 - Asymptomatic  GENERAL: Patient is a well appearing female in no acute distress HEENT: atraumatic normocephalic  LYMPH NODES: no enlarged inguinal nodes LUNGS:  normal respiratory effort ABDOMEN:  Soft, nontender, nondistended. No ascites masses. EXTREMITIES:  No peripheral edema.   NEURO:  Nonfocal. Well oriented.  Appropriate affect.  Pelvic: EGBUS: no lesions Cervix: surgically absent Vagina: eythematous central vaginl apex. It appears that she may have had a vaginal cuff dehiscence that subsquently healed. Unclear if it is perineum or bowel with re-epithelization. Uterus: surgically absent Adnexa: no palpable masses Rectovaginal: declined by patient  PROCEDURE: Vaginal colposcopy The risks and benefits of the procedure were  reviewed  and informed consent obtained. Time out was performed. The patient received pre-procedure teaching and expressed understanding. The post-procedure instructions were reviewed with the patient and she expressed understanding. The patient does not have any barriers to learning.  Colposcopy of the upper vagina was performed after application os acetic acid. The findings revealed appearance of dehiscence with re-epithelization. Erythema is present but not granulation tissue. White epithelium noted at 6 oclock but not the typical AWE of dysplasia. Denuded tissue at 6 o'clock and along the left vaginal wall removed for biopsies.  Procedure completed without complications. Hemostasis adequate. The patient tolerated the procedure well.   Post-procedure evaluation the patient was stable without complaints.   Lab Review Labs on site today: Wet prep sent.   Radiologic Imaging: N/A    Assessment:  Brandy Robinson is a 58 y.o. female diagnosed with LGSIL pap and abnormal vaginal cuff exam, suspect prior cuff dehiscence.    Medical co-morbidities complicating care: COPD Plan:   Problem List Items Addressed This Visit   None Visit Diagnoses       Abnormal vaginal Pap smear    -  Primary   Relevant Orders   Surgical pathology   Surgical pathology     Vaginal discharge       Relevant Orders   Wet prep, genital (Completed)       We discussed options for management includes follow up biopsies and wet prep with close surveillance. If biopsies all negative we can consider vaginal estrogen therapy nightly for 2-3 weeks then 2-3 times per week. Surveillance exams every 6 months for 2 years then can release from clinic with exams once annually if all findings are stable.   Repeat pap with HPV testing in one year.   Suggested return to clinic in  6 months.    The patient's diagnosis, an outline of the further diagnostic and laboratory studies which will be required, the recommendation for  surgery, and alternatives were discussed with her and her accompanying family members.  All questions were answered to their satisfaction.  I spoke with Dr. Luster Salters and he agrees with close surveillance. He is happy for us  to follow her vaginal findings and he will provide her other well woman care.   A total of 80 minutes were spent with the patient/family today; >50% was spent in education, counseling and coordination of care for LGSIL pap and abnormal vaginal cuff exam, suspect prior cuff dehiscence.   I personally had a face to face interaction and evaluated the patient jointly with the NP, Ms. Kenney Peacemaker.  I have reviewed her history and available records and have performed the key portions of the physical exam including inguinal node, abdominal exam, pelvic exam with my findings confirming those documented above by the APP.  I have discussed the case with the APP and the patient.  I agree with the above documentation, assessment and plan which was fully formulated by me.  Counseling was completed by me.   I personally saw the patient and performed a substantive portion of this encounter in conjunction with the listed APP as documented above.  Joelys Staubs Iola Manila, MD      CC:  Zenobia Hila, MD [address] [place],  [place] [address] [phone]

## 2023-06-15 ENCOUNTER — Ambulatory Visit: Payer: Self-pay | Admitting: Gastroenterology

## 2023-06-16 LAB — SURGICAL PATHOLOGY

## 2023-06-21 ENCOUNTER — Telehealth: Payer: Self-pay | Admitting: *Deleted

## 2023-06-21 NOTE — Telephone Encounter (Signed)
 Patient called asking for someone to call her back with the results of her biopsy.  Note to Kenney Peacemaker nurse practitioner and she says that she will be calling her not sure if it is going to be today or maybe in the morning.  When I called Brandy Robinson it went to voicemail and I left this message above so that she would be able to know about it.

## 2023-06-22 ENCOUNTER — Other Ambulatory Visit: Payer: Self-pay | Admitting: Nurse Practitioner

## 2023-06-22 MED ORDER — ESTRADIOL 0.1 MG/GM VA CREA: TOPICAL_CREAM | VAGINAL | 0 refills | Status: DC

## 2023-06-22 NOTE — Progress Notes (Signed)
 Spoke to patient by phone regarding results. Plan for vaginal estrogen- 1/4 applicatorful high into vagina at night x 2-3 weeks then reduce to 2-3 times a week for maintenance. Plan to se eher back in 6 months for reevaluation.

## 2023-06-26 ENCOUNTER — Telehealth: Payer: Self-pay | Admitting: *Deleted

## 2023-06-26 ENCOUNTER — Other Ambulatory Visit: Payer: Self-pay | Admitting: Nurse Practitioner

## 2023-06-26 MED ORDER — PREMARIN 0.625 MG/GM VA CREA: TOPICAL_CREAM | VAGINAL | 0 refills | Status: AC

## 2023-06-26 NOTE — Progress Notes (Signed)
 Prescription changed to premarin  per insurance.

## 2023-06-26 NOTE — Telephone Encounter (Signed)
 Per [place], this was submitted this morning and insurance approval is pending.

## 2023-06-26 NOTE — Telephone Encounter (Signed)
 Estradiol  cream denied. Insurance prefers Premarin  Cream (Brand). Lauren made aware and will send in a new prescription.

## 2023-06-26 NOTE — Telephone Encounter (Signed)
 Patient went to pick up her new prescription for estradiol  and it needs a PA.  I asked Pattie Borders if she can do the prior Auth and then let the patient know she can go and get.  Pattie Borders said she would help

## 2023-07-07 ENCOUNTER — Encounter: Payer: Self-pay | Admitting: *Deleted

## 2023-11-15 ENCOUNTER — Other Ambulatory Visit: Payer: Self-pay | Admitting: Physician Assistant

## 2023-11-15 DIAGNOSIS — R519 Headache, unspecified: Secondary | ICD-10-CM

## 2023-11-19 ENCOUNTER — Ambulatory Visit
Admission: RE | Admit: 2023-11-19 | Discharge: 2023-11-19 | Disposition: A | Discharge status: Home / Self Care | Source: Ambulatory Visit | Attending: Physician Assistant | Admitting: Physician Assistant

## 2023-11-19 DIAGNOSIS — R519 Headache, unspecified: Secondary | ICD-10-CM | POA: Insufficient documentation

## 2023-12-20 ENCOUNTER — Inpatient Hospital Stay: Attending: Family Medicine

## 2023-12-20 NOTE — Progress Notes (Deleted)
 Gynecologic Oncology Consult Visit   Referring Provider: Dr. Janit  Chief Complaint:   Subjective:  Brandy Robinson is a 58 y.o. female who is seen in consultation from Dr. Donal for LSIL pap with history of lichen sclerosus.   She had a total hysterectomy at age 43 for endometriosis. She has been on HRT since. Some time after her hysterectomy she endorses sharp, sudden pain during intercourse that resolved without intervention.   More recently, she was seen on 05/17/23 for well woman exam by Dr Janit. On exam, he noted redness of her vaginal cuff. Diagnostic vaginal pap was obtained.    05/17/23 - LSIL, HPV negative.   She denies history of abnormal pap. Not currently sexually active. Endorses recent BV and recent discharge.   Problem List: Patient Active Problem List   Diagnosis Date Noted   Polyp of colon 06/13/2023   Encounter for screening colonoscopy 06/13/2023   Symptomatic cholelithiasis 01/19/2023   Valvular heart disease 11/03/2022   Current smoker 11/03/2022   Hyperlipidemia associated with type 2 diabetes mellitus (HCC) 11/03/2022   Essential hypertension 11/03/2022   Peripheral neuropathy 11/03/2022   Healthcare maintenance 11/03/2022   Sepsis (HCC) 02/25/2021   Encounter for medication management 10/08/2018   SUI (stress urinary incontinence, female) 05/19/2017   Generalized anxiety disorder 03/21/2017   CAP (community acquired pneumonia) 03/18/2017   Acute respiratory failure (HCC)    Perforated viscus 12/30/2014   Lichen sclerosus 09/24/2014   Surgical menopause 09/24/2014   Endometriosis 09/24/2014   Bipolar 1 disorder (HCC) 09/24/2014   Insomnia 09/24/2014   GERD (gastroesophageal reflux disease) 09/24/2014   History of stomach ulcers 09/24/2014    Past Medical History: Past Medical History:  Diagnosis Date   Anxiety    Bipolar disorder (HCC)    COPD (chronic obstructive pulmonary disease) (HCC)    Depression    Diabetes mellitus without  complication (HCC)    Disc disorder    Dyspareunia    Endometriosis    GERD (gastroesophageal reflux disease)    Insomnia    Lichen    Lichen sclerosus    Surgical menopause    Vaginal atrophy    Wears glasses     Past Surgical History: Past Surgical History:  Procedure Laterality Date   ABDOMINAL HYSTERECTOMY  01/17/1993   tah-bso   ARM WOUND REPAIR / CLOSURE  01/18/2011   cellulitis rt arm-i/d   CHOLECYSTECTOMY  02/2023   COLONOSCOPY N/A 06/13/2023   Procedure: COLONOSCOPY;  Surgeon: Jinny Carmine, MD;  Location: [facility];  Service: Endoscopy;  Laterality: N/A;   KNEE BURSECTOMY Left 09/24/2013   Procedure: IRRIGATION AND DEBRIDEMENT OF LEFT KNEE ;  Surgeon: Evalene JONETTA Chancy, MD;  Location: [facility];  Service: Orthopedics;  Laterality: Left;   LAPAROTOMY N/A 12/30/2014   Procedure: EXPLORATORY LAPAROTOMY-graham patch of peptic ulcer;  Surgeon: Carlin Pastel, MD;  Location: [facility];  Service: General;  Laterality: N/A;   Transthoracic echo     TRANSTHORACIC ECHOCARDIOGRAM  03/25/2017   01/01/2015 [facility]): EF 50 to 55%.  Low normal range.  No RWMA.  Mild MR.;; 03/2017:: 03/22/2017 [facility]): EF 45 to 50%.  Mildly reduced.  No RWMA.  GR 1 DD.    Past Gynecologic History:  Menarche: 15 Menstrual details: Lasts 7 days,  Last Menstrual Period: s/p hysterectomy History of Abnormal pap: yes, LGSIL Last pap: per HPI Sexually active: no  OB History:  OB History  Gravida Para Term Preterm AB Living  1 1 1  1  SAB IAB Ectopic Multiple Live Births      1    # Outcome Date GA Lbr Len/2nd Weight Sex Type Anes PTL Lv  1 Term 76    M Vag-Spont   LIV    Family History: Family History  Problem Relation Age of Onset   Diabetes Mother    Diabetes Sister    Cancer Father        Throat   Heart disease Neg Hx    Breast cancer Neg Hx    Ovarian cancer Neg Hx    Colon cancer Neg Hx     Social History: Social History   Socioeconomic History   Marital  status: Married    Spouse name: Not on file   Number of children: Not on file   Years of education: Not on file   Highest education level: Not on file  Occupational History   Not on file  Tobacco Use   Smoking status: Every Day    Current packs/day: 0.00    Types: Cigarettes    Last attempt to quit: 10/26/2020    Years since quitting: 3.1   Smokeless tobacco: Former    Quit date: 03/17/2020   Tobacco comments:    1800quit now  Vaping Use   Vaping status: Never Used  Substance and Sexual Activity   Alcohol use: No   Drug use: No   Sexual activity: Yes    Birth control/protection: Surgical  Other Topics Concern   Not on file  Social History Narrative   Lives at home with husband.   Social Drivers of Corporate Investment Banker Strain: Low Risk (11/14/2019)   Received from [facility]   Overall Financial Resource Strain (CARDIA)    Difficulty of Paying Living Expenses: Not hard at all  Food Insecurity: No Food Insecurity (06/14/2023)   Hunger Vital Sign    Worried About Running Out of Food in the Last Year: Never true    Ran Out of Food in the Last Year: Never true  Transportation Needs: No Transportation Needs (06/14/2023)   PRAPARE - Administrator, Civil Service (Medical): No    Lack of Transportation (Non-Medical): No  Physical Activity: Sufficiently Active (05/18/2017)   Exercise Vital Sign    Days of Exercise per Week: 5 days    Minutes of Exercise per Session: 30 min  Stress: Not on file  Social Connections: Not on file  Intimate Partner Violence: Not At Risk (06/14/2023)   Humiliation, Afraid, Rape, and Kick questionnaire    Fear of Current or Ex-Partner: No    Emotionally Abused: No    Physically Abused: No    Sexually Abused: No    Allergies: Allergies  Allergen Reactions   Hydrocodone Itching   Aspirin  Other (See Comments)    Reaction:  GI upset     Current Medications: Current Outpatient Medications  Medication Sig Dispense Refill    ACCU-CHEK GUIDE test strip USE TO CHECK BLOOD SUGAR TWICE DAILY BEFORE MEALS     Blood Glucose Monitoring Suppl (ACCU-CHEK GUIDE) w/Device KIT USE AS DIRECTED TO CHECK BLOOD GLUCOSE     busPIRone  (BUSPAR ) 15 MG tablet Take 15 mg by mouth 2 (two) times daily.     clobetasol  ointment (TEMOVATE ) 0.05 % Apply 1 Application topically 2 (two) times a week. 60 g 1   clonazePAM  (KLONOPIN ) 0.5 MG tablet Take 0.5 mg by mouth at bedtime.     DULoxetine  (CYMBALTA ) 60 MG capsule  Take 120 mg by mouth at bedtime.     estradiol  (ESTRACE ) 1 MG tablet Take 1.5 tablets (1.5 mg total) by mouth daily. 135 tablet 3   gabapentin  (NEURONTIN ) 100 MG capsule Take 100 mg by mouth 2 (two) times daily.     JARDIANCE 25 MG TABS tablet Take 25 mg by mouth daily.     lisinopril  (ZESTRIL ) 20 MG tablet Take 20 mg by mouth daily.     ondansetron  (ZOFRAN -ODT) 4 MG disintegrating tablet Take 4 mg by mouth 2 (two) times daily as needed for vomiting or nausea.     pantoprazole  (PROTONIX ) 40 MG tablet Take 1 tablet (40 mg total) by mouth daily. 90 tablet 3   rosuvastatin (CRESTOR) 20 MG tablet Take 20 mg by mouth daily.     Semaglutide (OZEMPIC, 0.25 OR 0.5 MG/DOSE, [place]) Inject into the skin once a week.     sucralfate  (CARAFATE ) 1 g tablet Take 1 tablet (1 g total) by mouth 3 (three) times daily before meals. 90 tablet 2   traMADol  (ULTRAM ) 50 MG tablet Take 50 mg by mouth 2 (two) times daily as needed for moderate pain (pain score 4-6).     VENTOLIN  HFA 108 (90 Base) MCG/ACT inhaler Inhale 1-2 puffs into the lungs every 6 (six) hours as needed for wheezing or shortness of breath.     No current facility-administered medications for this visit.    Review of Systems General: positive for weight loss; negative for fevers, or night sweats Skin: negative for changes in moles or sores or rash Eyes: negative for changes in vision HEENT: negative for change in hearing, tinnitus, voice changes Pulmonary: negative for dyspnea, orthopnea,  productive cough, wheezing Cardiac: negative for palpitations, pain Gastrointestinal: positive for nausea, constipation, diarrhea; negative for vomiting, hematemesis, hematochezia. S/p recent colonoscopy, biopsies pending Genitourinary/Sexual: negative for dysuria, retention, hematuria, incontinence Ob/Gyn:  negative for abnormal bleeding, or pain Musculoskeletal: negative for pain, joint pain, back pain Hematology: negative for easy bruising, abnormal bleeding Neurologic/Psych: negative for headaches, seizures, paralysis, weakness, numbness    Objective:  Physical Examination:  There were no vitals taken for this visit.    ECOG Performance Status: 0 - Asymptomatic  GENERAL: Patient is a well appearing female in no acute distress HEENT: atraumatic normocephalic  LYMPH NODES: no enlarged inguinal nodes LUNGS:  normal respiratory effort ABDOMEN:  Soft, nontender, nondistended. No ascites masses. EXTREMITIES:  No peripheral edema.   NEURO:  Nonfocal. Well oriented.  Appropriate affect.  Pelvic: EGBUS: no lesions Cervix: surgically absent Vagina: eythematous central vaginl apex. It appears that she may have had a vaginal cuff dehiscence that subsquently healed. Unclear if it is perineum or bowel with re-epithelization. Uterus: surgically absent Adnexa: no palpable masses Rectovaginal: declined by patient  PROCEDURE: Vaginal colposcopy The risks and benefits of the procedure were reviewed and informed consent obtained. Time out was performed. The patient received pre-procedure teaching and expressed understanding. The post-procedure instructions were reviewed with the patient and she expressed understanding. The patient does not have any barriers to learning.  Colposcopy of the upper vagina was performed after application os acetic acid. The findings revealed appearance of dehiscence with re-epithelization. Erythema is present but not granulation tissue. White epithelium noted at 6  oclock but not the typical AWE of dysplasia. Denuded tissue at 6 o'clock and along the left vaginal wall removed for biopsies.  Procedure completed without complications. Hemostasis adequate. The patient tolerated the procedure well.   Post-procedure evaluation the patient was stable  without complaints.   Lab Review Labs on site today: Wet prep sent.   Radiologic Imaging: N/A    Assessment:  Brandy Robinson is a 58 y.o. female diagnosed with LGSIL pap and abnormal vaginal cuff exam, suspect prior cuff dehiscence.    Medical co-morbidities complicating care: COPD Plan:   Problem List Items Addressed This Visit   None  We discussed options for management includes follow up biopsies and wet prep with close surveillance. If biopsies all negative we can consider vaginal estrogen therapy nightly for 2-3 weeks then 2-3 times per week. Surveillance exams every 6 months for 2 years then can release from clinic with exams once annually if all findings are stable.   Repeat pap with HPV testing in one year.   Suggested return to clinic in  6 months.    The patient's diagnosis, an outline of the further diagnostic and laboratory studies which will be required, the recommendation for surgery, and alternatives were discussed with her and her accompanying family members.  All questions were answered to their satisfaction.  I spoke with Dr. Janit and he agrees with close surveillance. He is happy for us  to follow her vaginal findings and he will provide her other well woman care.   A total of 80 minutes were spent with the patient/family today; >50% was spent in education, counseling and coordination of care for LGSIL pap and abnormal vaginal cuff exam, suspect prior cuff dehiscence.   I personally had a face to face interaction and evaluated the patient jointly with the NP, Ms. Tinnie Dawn.  I have reviewed her history and available records and have performed the key portions of the physical exam  including inguinal node, abdominal exam, pelvic exam with my findings confirming those documented above by the APP.  I have discussed the case with the APP and the patient.  I agree with the above documentation, assessment and plan which was fully formulated by me.  Counseling was completed by me.   Prentice Agent, MD      CC:  Janit Alm Agent, MD [address] [place],  [place] [address] [phone]

## 2024-01-02 ENCOUNTER — Other Ambulatory Visit: Payer: Self-pay | Admitting: Physician Assistant

## 2024-01-02 DIAGNOSIS — G8929 Other chronic pain: Secondary | ICD-10-CM

## 2024-01-05 ENCOUNTER — Ambulatory Visit
Admission: RE | Admit: 2024-01-05 | Discharge: 2024-01-05 | Disposition: A | Discharge status: Home / Self Care | Source: Ambulatory Visit | Attending: Physician Assistant | Admitting: Physician Assistant

## 2024-01-05 DIAGNOSIS — G8929 Other chronic pain: Secondary | ICD-10-CM | POA: Diagnosis present

## 2024-01-05 DIAGNOSIS — M5441 Lumbago with sciatica, right side: Secondary | ICD-10-CM | POA: Diagnosis present

## 2024-01-16 NOTE — Progress Notes (Signed)
 Received 1000 mcg of b12 in left deltoid. Tolerated without difficulty

## 2024-02-09 NOTE — Progress Notes (Unsigned)
 "   Referring Physician:  Donal Channing SQUIBB, FNP [address] [place],  [place] [address]  Primary Physician:  Donal Channing SQUIBB, FNP  History of Present Illness: 02/09/2024 [name] is here today with a chief complaint of ***   Low back pain that goes into the right leg.   Numbness/tingling in the leg?  Duration: *** Location: *** Quality: *** Severity: ***  Precipitating: aggravated by *** Modifying factors: made better by *** Weakness: none Timing: *** Bowel/Bladder Dysfunction: none  Conservative measures:  Physical therapy: *** has participated in 2 visits at [facility], initial evaluation on 12/20/23.  Multimodal medical therapy including regular antiinflammatories: *** tramadol , gabapentin , tizanidine, lyrica, prednisone , oxycodone , methocarbamol,  Injections: *** no epidural steroid injections?  Past Surgery: ***no spinal surgeries   [name] has ***no symptoms of cervical myelopathy.  The symptoms are causing a significant impact on the patient's life.   I have utilized the care everywhere function in epic to review the outside records available from external health systems.   Review of Systems:  A 10 point review of systems is negative, except for the pertinent positives and negatives detailed in the HPI.  Past Medical History: Past Medical History:  Diagnosis Date   Anxiety    Bipolar disorder (HCC)    COPD (chronic obstructive pulmonary disease) (HCC)    Depression    Diabetes mellitus without complication (HCC)    Disc disorder    Dyspareunia    Endometriosis    GERD (gastroesophageal reflux disease)    Insomnia    Lichen    Lichen sclerosus    Surgical menopause    Vaginal atrophy    Wears glasses     Past Surgical History: Past Surgical History:  Procedure Laterality Date   ABDOMINAL HYSTERECTOMY  01/17/1993   tah-bso   ARM WOUND REPAIR / CLOSURE  01/18/2011   cellulitis rt arm-i/d   CHOLECYSTECTOMY  02/2023    COLONOSCOPY N/A 06/13/2023   Procedure: COLONOSCOPY;  Surgeon: Jinny Carmine, MD;  Location: [facility];  Service: Endoscopy;  Laterality: N/A;   KNEE BURSECTOMY Left 09/24/2013   Procedure: IRRIGATION AND DEBRIDEMENT OF LEFT KNEE ;  Surgeon: Evalene JONETTA Chancy, MD;  Location: [facility];  Service: Orthopedics;  Laterality: Left;   LAPAROTOMY N/A 12/30/2014   Procedure: EXPLORATORY LAPAROTOMY-graham patch of peptic ulcer;  Surgeon: Carlin Pastel, MD;  Location: [facility];  Service: General;  Laterality: N/A;   Transthoracic echo     TRANSTHORACIC ECHOCARDIOGRAM  03/25/2017   01/01/2015 [facility]): EF 50 to 55%.  Low normal range.  No RWMA.  Mild MR.;; 03/2017:: 03/22/2017 [facility]): EF 45 to 50%.  Mildly reduced.  No RWMA.  GR 1 DD.    Allergies: Allergies as of 02/13/2024 - Review Complete 06/14/2023  Allergen Reaction Noted   Hydrocodone Itching 09/24/2013   Aspirin  Other (See Comments) 08/25/2013    Medications: Current Medications[1]  Social History: Social History[2]  Family Medical History: Family History  Problem Relation Age of Onset   Diabetes Mother    Diabetes Sister    Cancer Father        Throat   Heart disease Neg Hx    Breast cancer Neg Hx    Ovarian cancer Neg Hx    Colon cancer Neg Hx     Physical Examination: There were no vitals filed for this visit.  General: Patient is in no apparent distress. Attention to examination is appropriate.  Neck:   Supple.  Full range of motion.  Respiratory: Patient is breathing without any difficulty.   NEUROLOGICAL:     Awake, alert, oriented to person, place, and time.  Speech is clear and fluent.   Cranial Nerves: Pupils equal round and reactive to light.  Facial tone is symmetric.  Facial sensation is symmetric. Shoulder shrug is symmetric. Tongue protrusion is midline.  There is no pronator drift.  Strength: Side Biceps Triceps Deltoid Interossei Grip Wrist Ext. Wrist Flex.  R 5 5 5 5 5 5 5   L 5 5  5 5 5 5 5    Side Iliopsoas Quads Hamstring PF DF EHL  R 5 5 5 5 5 5   L 5 5 5 5 5 5    Reflexes are ***2+ and symmetric at the biceps, triceps, brachioradialis, patella and achilles.   Hoffman's is absent.   Bilateral upper and lower extremity sensation is intact to light touch.    No evidence of dysmetria noted.  Gait is normal.     Medical Decision Making  Imaging: ***  I have personally reviewed the images and agree with the above interpretation.  Assessment and Plan: [name] is a pleasant 59 y.o. female with ***      Thank you for involving me in the care of this patient.      Chester K. Clois MD, MPHS Neurosurgery     [1]  Current Outpatient Medications:    ACCU-CHEK GUIDE test strip, USE TO CHECK BLOOD SUGAR TWICE DAILY BEFORE MEALS, Disp: , Rfl:    Blood Glucose Monitoring Suppl (ACCU-CHEK GUIDE) w/Device KIT, USE AS DIRECTED TO CHECK BLOOD GLUCOSE, Disp: , Rfl:    busPIRone  (BUSPAR ) 15 MG tablet, Take 15 mg by mouth 2 (two) times daily., Disp: , Rfl:    clobetasol  ointment (TEMOVATE ) 0.05 %, Apply 1 Application topically 2 (two) times a week., Disp: 60 g, Rfl: 1   clonazePAM  (KLONOPIN ) 0.5 MG tablet, Take 0.5 mg by mouth at bedtime., Disp: , Rfl:    DULoxetine  (CYMBALTA ) 60 MG capsule, Take 120 mg by mouth at bedtime., Disp: , Rfl:    estradiol  (ESTRACE ) 1 MG tablet, Take 1.5 tablets (1.5 mg total) by mouth daily., Disp: 135 tablet, Rfl: 3   gabapentin  (NEURONTIN ) 100 MG capsule, Take 100 mg by mouth 2 (two) times daily., Disp: , Rfl:    JARDIANCE 25 MG TABS tablet, Take 25 mg by mouth daily., Disp: , Rfl:    lisinopril  (ZESTRIL ) 20 MG tablet, Take 20 mg by mouth daily., Disp: , Rfl:    ondansetron  (ZOFRAN -ODT) 4 MG disintegrating tablet, Take 4 mg by mouth 2 (two) times daily as needed for vomiting or nausea., Disp: , Rfl:    pantoprazole  (PROTONIX ) 40 MG tablet, Take 1 tablet (40 mg total) by mouth daily., Disp: 90 tablet, Rfl: 3   rosuvastatin  (CRESTOR) 20 MG tablet, Take 20 mg by mouth daily., Disp: , Rfl:    Semaglutide (OZEMPIC, 0.25 OR 0.5 MG/DOSE, [place]), Inject into the skin once a week., Disp: , Rfl:    sucralfate  (CARAFATE ) 1 g tablet, Take 1 tablet (1 g total) by mouth 3 (three) times daily before meals., Disp: 90 tablet, Rfl: 2   traMADol  (ULTRAM ) 50 MG tablet, Take 50 mg by mouth 2 (two) times daily as needed for moderate pain (pain score 4-6)., Disp: , Rfl:    VENTOLIN  HFA 108 (90 Base) MCG/ACT inhaler, Inhale 1-2 puffs into the lungs every 6 (six) hours as needed for wheezing or shortness  of breath., Disp: , Rfl:  [2]  Social History Tobacco Use   Smoking status: Every Day    Current packs/day: 0.00    Types: Cigarettes    Last attempt to quit: 10/26/2020    Years since quitting: 3.2   Smokeless tobacco: Former    Quit date: 03/17/2020   Tobacco comments:    1800quit now  Vaping Use   Vaping status: Never Used  Substance Use Topics   Alcohol use: No   Drug use: No   "

## 2024-02-13 ENCOUNTER — Ambulatory Visit: Admitting: Neurosurgery

## 2024-02-14 NOTE — Progress Notes (Unsigned)
 "   Referring Physician:  Donal Channing SQUIBB, FNP [address] [place],  [place] [address]  Primary Physician:  Donal Channing SQUIBB, FNP  History of Present Illness: 02/14/2024 [name] is here today with a chief complaint of ***  Low back pain that goes into the right leg.    Numbness/tingling in the leg?  Right foot drop?  Duration: *** Location: *** Quality: *** Severity: ***  Precipitating: aggravated by *** Modifying factors: made better by *** Weakness: none Timing: *** Bowel/Bladder Dysfunction: none  Conservative measures:  Physical therapy: ***  has participated in 2 visits at [facility], initial evaluation on 12/20/23. Per messages ok to stay on the schedule due to weakness Multimodal medical therapy including regular antiinflammatories: *** tramadol , gabapentin , tizanidine, lyrica, prednisone , oxycodone , methocarbamol Injections: *** no epidural steroid injections?  Past Surgery: ***no spinal surgeries   ESTEE YOHE has ***no symptoms of cervical myelopathy.  The symptoms are causing a significant impact on the patient's life.   I have utilized the care everywhere function in epic to review the outside records available from external health systems.   Review of Systems:  A 10 point review of systems is negative, except for the pertinent positives and negatives detailed in the HPI.  Past Medical History: Past Medical History:  Diagnosis Date   Anxiety    Bipolar disorder (HCC)    COPD (chronic obstructive pulmonary disease) (HCC)    Depression    Diabetes mellitus without complication (HCC)    Disc disorder    Dyspareunia    Endometriosis    GERD (gastroesophageal reflux disease)    Insomnia    Lichen    Lichen sclerosus    Surgical menopause    Vaginal atrophy    Wears glasses     Past Surgical History: Past Surgical History:  Procedure Laterality Date   ABDOMINAL HYSTERECTOMY  01/17/1993   tah-bso   ARM WOUND REPAIR /  CLOSURE  01/18/2011   cellulitis rt arm-i/d   CHOLECYSTECTOMY  02/2023   COLONOSCOPY N/A 06/13/2023   Procedure: COLONOSCOPY;  Surgeon: Jinny Carmine, MD;  Location: [facility];  Service: Endoscopy;  Laterality: N/A;   KNEE BURSECTOMY Left 09/24/2013   Procedure: IRRIGATION AND DEBRIDEMENT OF LEFT KNEE ;  Surgeon: Evalene JONETTA Chancy, MD;  Location: [facility];  Service: Orthopedics;  Laterality: Left;   LAPAROTOMY N/A 12/30/2014   Procedure: EXPLORATORY LAPAROTOMY-graham patch of peptic ulcer;  Surgeon: Carlin Pastel, MD;  Location: [facility];  Service: General;  Laterality: N/A;   Transthoracic echo     TRANSTHORACIC ECHOCARDIOGRAM  03/25/2017   01/01/2015 [facility]): EF 50 to 55%.  Low normal range.  No RWMA.  Mild MR.;; 03/2017:: 03/22/2017 [facility]): EF 45 to 50%.  Mildly reduced.  No RWMA.  GR 1 DD.    Allergies: Allergies as of 02/22/2024 - Review Complete 06/14/2023  Allergen Reaction Noted   Hydrocodone Itching 09/24/2013   Aspirin  Other (See Comments) 08/25/2013    Medications: Current Medications[1]  Social History: Social History[2]  Family Medical History: Family History  Problem Relation Age of Onset   Diabetes Mother    Diabetes Sister    Cancer Father        Throat   Heart disease Neg Hx    Breast cancer Neg Hx    Ovarian cancer Neg Hx    Colon cancer Neg Hx     Physical Examination: There were no vitals filed for this visit.  General: Patient is  in no apparent distress. Attention to examination is appropriate.  Neck:   Supple.  Full range of motion.  Respiratory: Patient is breathing without any difficulty.   NEUROLOGICAL:     Awake, alert, oriented to person, place, and time.  Speech is clear and fluent.   Cranial Nerves: Pupils equal round and reactive to light.  Facial tone is symmetric.  Facial sensation is symmetric. Shoulder shrug is symmetric. Tongue protrusion is midline.  There is no pronator drift.  Strength: Side Biceps Triceps  Deltoid Interossei Grip Wrist Ext. Wrist Flex.  R 5 5 5 5 5 5 5   L 5 5 5 5 5 5 5    Side Iliopsoas Quads Hamstring PF DF EHL  R 5 5 5 5 5 5   L 5 5 5 5 5 5    Reflexes are ***2+ and symmetric at the biceps, triceps, brachioradialis, patella and achilles.   Hoffman's is absent.   Bilateral upper and lower extremity sensation is intact to light touch.    No evidence of dysmetria noted.  Gait is normal.     Medical Decision Making  Imaging: ***  I have personally reviewed the images and agree with the above interpretation.  Assessment and Plan: Brandy Robinson is a pleasant 59 y.o. female with ***      Thank you for involving me in the care of this patient.      Chester K. Clois MD, MPHS Neurosurgery     [1]  Current Outpatient Medications:    ACCU-CHEK GUIDE test strip, USE TO CHECK BLOOD SUGAR TWICE DAILY BEFORE MEALS, Disp: , Rfl:    Blood Glucose Monitoring Suppl (ACCU-CHEK GUIDE) w/Device KIT, USE AS DIRECTED TO CHECK BLOOD GLUCOSE, Disp: , Rfl:    busPIRone  (BUSPAR ) 15 MG tablet, Take 15 mg by mouth 2 (two) times daily., Disp: , Rfl:    butalbital-acetaminophen -caffeine (FIORICET) 50-325-40 MG tablet, Take 1 tablet at headache onset, can repeat after 4 hours. No more than 2 pills in 24 hours. Do not take more than 2-3 times a week MAXIMUM.  Do not take with tramadol  or clonazepam ., Disp: , Rfl:    clobetasol  ointment (TEMOVATE ) 0.05 %, Apply 1 Application topically 2 (two) times a week., Disp: 60 g, Rfl: 1   clonazePAM  (KLONOPIN ) 0.5 MG tablet, Take 0.5 mg by mouth at bedtime., Disp: , Rfl:    DULoxetine  (CYMBALTA ) 60 MG capsule, Take 120 mg by mouth at bedtime., Disp: , Rfl:    estradiol  (ESTRACE ) 1 MG tablet, Take 1.5 tablets (1.5 mg total) by mouth daily., Disp: 135 tablet, Rfl: 3   gabapentin  (NEURONTIN ) 100 MG capsule, Take 100 mg by mouth 2 (two) times daily., Disp: , Rfl:    gabapentin  (NEURONTIN ) 300 MG capsule, Take 300 mg by mouth every 8 (eight) hours.,  Disp: , Rfl:    Glucagon (GVOKE PFS) 1 MG/0.2ML SOSY, USE 1 APPLICATORFUL SUBCUTANEOUSLY ONCE AS NEEDED. MAY REPEAT DOSE IN 15 MINUTES IF NEEDED DUE TO HYPOGLYCEMIA, Disp: , Rfl:    JARDIANCE 25 MG TABS tablet, Take 25 mg by mouth daily., Disp: , Rfl:    lidocaine  (XYLOCAINE ) 2 % solution, SWISH AND SPIT BY MOUTH EVERY 3 HOURS., Disp: , Rfl:    lisinopril  (ZESTRIL ) 20 MG tablet, Take 20 mg by mouth daily., Disp: , Rfl:    metFORMIN (GLUCOPHAGE) 500 MG tablet, Take 1 tablet by mouth 2 (two) times daily with a meal., Disp: , Rfl:    methocarbamol (ROBAXIN) 500 MG tablet, Take  1 tablet 4 times a day by oral route as needed, for muscle spasms., Disp: , Rfl:    ondansetron  (ZOFRAN -ODT) 4 MG disintegrating tablet, Take 4 mg by mouth 2 (two) times daily as needed for vomiting or nausea., Disp: , Rfl:    oxyCODONE  (OXY IR/ROXICODONE ) 5 MG immediate release tablet, Take 1 tablet by mouth every 4 (four) hours as needed., Disp: , Rfl:    OZEMPIC, 1 MG/DOSE, 4 MG/3ML SOPN, Inject 1 mg into the skin once a week., Disp: , Rfl:    pantoprazole  (PROTONIX ) 40 MG tablet, Take 1 tablet (40 mg total) by mouth daily., Disp: 90 tablet, Rfl: 3   pregabalin (LYRICA) 50 MG capsule, Take 50 mg in the morning and 150 mg at night, Disp: , Rfl:    rosuvastatin (CRESTOR) 20 MG tablet, Take 20 mg by mouth daily., Disp: , Rfl:    Semaglutide (OZEMPIC, 0.25 OR 0.5 MG/DOSE, [place]), Inject into the skin once a week., Disp: , Rfl:    sucralfate  (CARAFATE ) 1 g tablet, Take 1 tablet (1 g total) by mouth 3 (three) times daily before meals., Disp: 90 tablet, Rfl: 2   tiZANidine (ZANAFLEX) 4 MG tablet, Take 4 mg by mouth every 8 (eight) hours as needed., Disp: , Rfl:    topiramate (TOPAMAX) 100 MG tablet, Take 100 mg by mouth daily., Disp: , Rfl:    traMADol  (ULTRAM ) 50 MG tablet, Take 50 mg by mouth 2 (two) times daily as needed for moderate pain (pain score 4-6)., Disp: , Rfl:    VENTOLIN  HFA 108 (90 Base) MCG/ACT inhaler, Inhale 1-2  puffs into the lungs every 6 (six) hours as needed for wheezing or shortness of breath., Disp: , Rfl:    VRAYLAR 1.5 MG capsule, Take 1.5 mg by mouth daily., Disp: , Rfl:  [2]  Social History Tobacco Use   Smoking status: Every Day    Current packs/day: 0.00    Types: Cigarettes    Last attempt to quit: 10/26/2020    Years since quitting: 3.3   Smokeless tobacco: Former    Quit date: 03/17/2020   Tobacco comments:    1800quit now  Vaping Use   Vaping status: Never Used  Substance Use Topics   Alcohol use: No   Drug use: No   "

## 2024-02-14 NOTE — Telephone Encounter (Signed)
 Noted. Nothing was documented in the referral that this was reviewed or about her weakness.   I will add this to the note when I pre-chart.

## 2024-02-22 ENCOUNTER — Encounter: Payer: Self-pay | Admitting: Neurosurgery

## 2024-02-22 ENCOUNTER — Ambulatory Visit: Admitting: Neurosurgery

## 2024-02-22 VITALS — BP 124/68 | Ht 63.0 in | Wt 124.4 lb

## 2024-02-22 DIAGNOSIS — M48061 Spinal stenosis, lumbar region without neurogenic claudication: Secondary | ICD-10-CM

## 2024-02-22 DIAGNOSIS — M5416 Radiculopathy, lumbar region: Secondary | ICD-10-CM

## 2024-02-22 DIAGNOSIS — G959 Disease of spinal cord, unspecified: Secondary | ICD-10-CM

## 2024-02-27 ENCOUNTER — Ambulatory Visit

## 2024-04-09 ENCOUNTER — Ambulatory Visit: Admitting: Neurosurgery
